# Patient Record
Sex: Female | Born: 1960 | Race: White | Hispanic: No | Marital: Married | Smoking: Former smoker
Health system: Southern US, Community
[De-identification: ages and names within clinical notes are randomized; demographics above are authoritative.]

## PROBLEM LIST (undated history)

## (undated) DIAGNOSIS — K8020 Calculus of gallbladder without cholecystitis without obstruction: Secondary | ICD-10-CM

## (undated) DIAGNOSIS — L509 Urticaria, unspecified: Secondary | ICD-10-CM

## (undated) DIAGNOSIS — R1310 Dysphagia, unspecified: Secondary | ICD-10-CM

## (undated) DIAGNOSIS — G709 Myoneural disorder, unspecified: Secondary | ICD-10-CM

## (undated) DIAGNOSIS — J45909 Unspecified asthma, uncomplicated: Secondary | ICD-10-CM

## (undated) DIAGNOSIS — D126 Benign neoplasm of colon, unspecified: Secondary | ICD-10-CM

## (undated) DIAGNOSIS — K219 Gastro-esophageal reflux disease without esophagitis: Secondary | ICD-10-CM

## (undated) DIAGNOSIS — F329 Major depressive disorder, single episode, unspecified: Secondary | ICD-10-CM

## (undated) DIAGNOSIS — I8500 Esophageal varices without bleeding: Secondary | ICD-10-CM

## (undated) DIAGNOSIS — Z973 Presence of spectacles and contact lenses: Secondary | ICD-10-CM

## (undated) DIAGNOSIS — F32A Depression, unspecified: Secondary | ICD-10-CM

## (undated) DIAGNOSIS — F419 Anxiety disorder, unspecified: Secondary | ICD-10-CM

## (undated) DIAGNOSIS — M1990 Unspecified osteoarthritis, unspecified site: Secondary | ICD-10-CM

## (undated) DIAGNOSIS — D589 Hereditary hemolytic anemia, unspecified: Secondary | ICD-10-CM

## (undated) DIAGNOSIS — G8929 Other chronic pain: Secondary | ICD-10-CM

## (undated) DIAGNOSIS — Z9109 Other allergy status, other than to drugs and biological substances: Secondary | ICD-10-CM

## (undated) DIAGNOSIS — D696 Thrombocytopenia, unspecified: Secondary | ICD-10-CM

## (undated) DIAGNOSIS — F411 Generalized anxiety disorder: Secondary | ICD-10-CM

## (undated) DIAGNOSIS — R519 Headache, unspecified: Secondary | ICD-10-CM

## (undated) DIAGNOSIS — T7840XA Allergy, unspecified, initial encounter: Secondary | ICD-10-CM

## (undated) DIAGNOSIS — K7460 Unspecified cirrhosis of liver: Secondary | ICD-10-CM

## (undated) HISTORY — PX: CHOLECYSTECTOMY: SHX55

## (undated) HISTORY — DX: Dysphagia, unspecified: R13.10

## (undated) HISTORY — PX: NASAL SEPTUM SURGERY: SHX37

## (undated) HISTORY — DX: Other chronic pain: G89.29

## (undated) HISTORY — PX: PROSTATE SURGERY: SHX751

## (undated) HISTORY — DX: Urticaria, unspecified: L50.9

## (undated) HISTORY — DX: Generalized anxiety disorder: F41.1

## (undated) HISTORY — DX: Anxiety disorder, unspecified: F41.9

## (undated) HISTORY — PX: APPENDECTOMY: SHX54

## (undated) HISTORY — PX: BREAST SURGERY: SHX581

## (undated) HISTORY — DX: Calculus of gallbladder without cholecystitis without obstruction: K80.20

## (undated) HISTORY — DX: Gastro-esophageal reflux disease without esophagitis: K21.9

## (undated) HISTORY — PX: BREAST REDUCTION SURGERY: SHX8

## (undated) HISTORY — DX: Benign neoplasm of colon, unspecified: D12.6

## (undated) HISTORY — DX: Depression, unspecified: F32.A

## (undated) HISTORY — DX: Unspecified asthma, uncomplicated: J45.909

## (undated) HISTORY — DX: Myoneural disorder, unspecified: G70.9

## (undated) HISTORY — DX: Allergy, unspecified, initial encounter: T78.40XA

## (undated) HISTORY — DX: Major depressive disorder, single episode, unspecified: F32.9

---
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
## 1978-05-25 HISTORY — PX: BREAST SURGERY: SHX581

## 2010-08-14 ENCOUNTER — Encounter (INDEPENDENT_AMBULATORY_CARE_PROVIDER_SITE_OTHER): Payer: Self-pay | Admitting: *Deleted

## 2010-08-15 ENCOUNTER — Other Ambulatory Visit: Payer: Self-pay | Admitting: Family Medicine

## 2010-08-15 DIAGNOSIS — Z1231 Encounter for screening mammogram for malignant neoplasm of breast: Secondary | ICD-10-CM

## 2010-08-25 ENCOUNTER — Ambulatory Visit: Payer: Self-pay

## 2010-09-09 ENCOUNTER — Ambulatory Visit: Payer: Medicare PPO | Attending: Internal Medicine | Admitting: Physical Therapy

## 2010-09-09 DIAGNOSIS — R5381 Other malaise: Secondary | ICD-10-CM | POA: Insufficient documentation

## 2010-09-09 DIAGNOSIS — R262 Difficulty in walking, not elsewhere classified: Secondary | ICD-10-CM | POA: Insufficient documentation

## 2010-09-09 DIAGNOSIS — M545 Low back pain, unspecified: Secondary | ICD-10-CM | POA: Insufficient documentation

## 2010-09-09 DIAGNOSIS — IMO0001 Reserved for inherently not codable concepts without codable children: Secondary | ICD-10-CM | POA: Insufficient documentation

## 2010-09-09 DIAGNOSIS — M2569 Stiffness of other specified joint, not elsewhere classified: Secondary | ICD-10-CM | POA: Insufficient documentation

## 2010-09-10 ENCOUNTER — Ambulatory Visit
Admission: RE | Admit: 2010-09-10 | Discharge: 2010-09-10 | Disposition: A | Discharge status: Home / Self Care | Payer: Medicare PPO | Source: Ambulatory Visit | Attending: Family Medicine | Admitting: Family Medicine

## 2010-09-10 DIAGNOSIS — Z1231 Encounter for screening mammogram for malignant neoplasm of breast: Secondary | ICD-10-CM

## 2010-09-11 ENCOUNTER — Encounter: Payer: Self-pay | Admitting: Internal Medicine

## 2010-09-11 ENCOUNTER — Ambulatory Visit (AMBULATORY_SURGERY_CENTER): Payer: Medicare PPO | Admitting: *Deleted

## 2010-09-11 VITALS — Ht 66.0 in | Wt 249.0 lb

## 2010-09-11 DIAGNOSIS — Z1211 Encounter for screening for malignant neoplasm of colon: Secondary | ICD-10-CM

## 2010-09-11 MED ORDER — PEG-KCL-NACL-NASULF-NA ASC-C 100 G PO SOLR: 1.0000 | Freq: Once | ORAL | Status: AC

## 2010-09-16 ENCOUNTER — Ambulatory Visit: Payer: Medicare PPO | Admitting: Physical Therapy

## 2010-09-17 ENCOUNTER — Ambulatory Visit: Payer: Medicare PPO | Admitting: Physical Therapy

## 2010-09-24 ENCOUNTER — Encounter: Payer: Medicare PPO | Admitting: Physical Therapy

## 2010-09-25 ENCOUNTER — Ambulatory Visit: Payer: Medicare PPO | Attending: Internal Medicine | Admitting: Physical Therapy

## 2010-09-25 ENCOUNTER — Other Ambulatory Visit: Payer: Self-pay | Admitting: Internal Medicine

## 2010-09-25 DIAGNOSIS — R262 Difficulty in walking, not elsewhere classified: Secondary | ICD-10-CM | POA: Insufficient documentation

## 2010-09-25 DIAGNOSIS — IMO0001 Reserved for inherently not codable concepts without codable children: Secondary | ICD-10-CM | POA: Insufficient documentation

## 2010-09-25 DIAGNOSIS — M545 Low back pain, unspecified: Secondary | ICD-10-CM | POA: Insufficient documentation

## 2010-09-25 DIAGNOSIS — R5381 Other malaise: Secondary | ICD-10-CM | POA: Insufficient documentation

## 2010-09-25 DIAGNOSIS — M2569 Stiffness of other specified joint, not elsewhere classified: Secondary | ICD-10-CM | POA: Insufficient documentation

## 2010-09-29 ENCOUNTER — Encounter: Payer: Self-pay | Admitting: Internal Medicine

## 2010-09-29 ENCOUNTER — Encounter: Payer: Medicare PPO | Admitting: Physical Therapy

## 2010-09-30 ENCOUNTER — Encounter: Payer: Self-pay | Admitting: Internal Medicine

## 2010-09-30 ENCOUNTER — Ambulatory Visit (AMBULATORY_SURGERY_CENTER): Payer: Medicare PPO | Admitting: Internal Medicine

## 2010-09-30 VITALS — BP 137/76 | HR 104 | Temp 98.4°F | Resp 9 | Ht 66.0 in | Wt 245.0 lb

## 2010-09-30 DIAGNOSIS — D175 Benign lipomatous neoplasm of intra-abdominal organs: Secondary | ICD-10-CM

## 2010-09-30 DIAGNOSIS — D126 Benign neoplasm of colon, unspecified: Secondary | ICD-10-CM

## 2010-09-30 DIAGNOSIS — K5730 Diverticulosis of large intestine without perforation or abscess without bleeding: Secondary | ICD-10-CM

## 2010-09-30 DIAGNOSIS — Z1211 Encounter for screening for malignant neoplasm of colon: Secondary | ICD-10-CM

## 2010-09-30 MED ORDER — SODIUM CHLORIDE 0.9 % IV SOLN: 500.0000 mL | INTRAVENOUS | Status: DC

## 2010-09-30 NOTE — Patient Instructions (Addendum)
1 polyp Mild diverticulosis 1 lipoma  High fiber diet Await pathology to schedule recall colonoscopy

## 2010-09-30 NOTE — Progress Notes (Signed)
i saw note not to touch pt's left ankle.  I advise pt we would be very careful with her ankle.MAW  I made Dr. Juanda Chance aware of the pt's ankle. MAW  Pt was very uncomfortable while the scope was advanced at times.  Medication was titrated per MD orders.  Pt relaxed and rested comfortably on the way out of the cecum.  MAW

## 2010-09-30 NOTE — Progress Notes (Signed)
Pt. Stated that have chronic regional pain syndrome left ankle. Stated that when wake up will be in pain, do not touch left ankle.

## 2010-10-01 ENCOUNTER — Ambulatory Visit: Payer: Medicare PPO | Admitting: Physical Therapy

## 2010-10-01 ENCOUNTER — Telehealth: Payer: Self-pay | Admitting: *Deleted

## 2010-10-01 NOTE — Telephone Encounter (Signed)
Follow up Call- Patient questions:  Do you have a fever, pain , or abdominal swelling? yes Pain Score  1 *  Have you tolerated food without any problems? yes  Have you been able to return to your normal activities? yes  Do you have any questions about your discharge instructions: Diet   no Medications  no Follow up visit  no  Do you have questions or concerns about your Care? no  Actions: * If pain score is 4 or above: No action needed, pain <4.  Patient stating mild pain lower abd. And back, less than a 4, no problems with fever or swelling or any difficulty with normal activities.

## 2010-10-03 ENCOUNTER — Encounter: Payer: Medicare PPO | Admitting: Physical Therapy

## 2010-10-06 ENCOUNTER — Encounter: Payer: Self-pay | Admitting: Internal Medicine

## 2010-10-06 ENCOUNTER — Encounter: Payer: Medicare PPO | Admitting: Physical Therapy

## 2010-10-08 ENCOUNTER — Encounter: Payer: Medicare PPO | Admitting: Physical Therapy

## 2010-10-09 ENCOUNTER — Encounter: Payer: Medicare PPO | Admitting: Physical Therapy

## 2010-10-13 ENCOUNTER — Encounter: Payer: Medicare PPO | Admitting: Physical Therapy

## 2010-10-15 ENCOUNTER — Encounter: Payer: Medicare PPO | Admitting: Physical Therapy

## 2010-10-17 ENCOUNTER — Encounter: Payer: Medicare PPO | Admitting: Physical Therapy

## 2010-11-17 NOTE — Letter (Signed)
Summary: Pre Visit Letter Revised  [facility]  [address] [place], [place] [address]   Phone: [phone]  Fax: [phone]        08/14/2010 MRN: [id number] Theresa Moran [address] [place], [place]  [address]  [place]             Procedure Date:  09-25-10           Direct Colon---Dr. Juanda Chance   Welcome to the Gastroenterology Division at [facility].    You are scheduled to see a nurse for your pre-procedure visit on 09-11-10 at 10:30a.m. on the 3rd floor at [facility], [address]  We ask that you try to arrive at our office 15 minutes prior to your appointment time to allow for check-in.  Please take a minute to review the attached form.  If you answer "Yes" to one or more of the questions on the first page, we ask that you call the person listed at your earliest opportunity.  If you answer "No" to all of the questions, please complete the rest of the form and bring it to your appointment.    Your nurse visit will consist of discussing your medical and surgical history, your immediate family medical history, and your medications.   If you are unable to list all of your medications on the form, please bring the medication bottles to your appointment and we will list them.  We will need to be aware of both prescribed and over the counter drugs.  We will need to know exact dosage information as well.    Please be prepared to read and sign documents such as consent forms, a financial agreement, and acknowledgement forms.  If necessary, and with your consent, a friend or relative is welcome to sit-in on the nurse visit with you.  Please bring your insurance card so that we may make a copy of it.  If your insurance requires a referral to see a specialist, please bring your referral form from your primary care physician.  No co-pay is required for this nurse visit.     If you cannot keep your appointment, please call [phone] to cancel or reschedule prior to  your appointment date.  This allows [place] the opportunity to schedule an appointment for another patient in need of care.    Thank you for choosing [facility] for your medical needs.  We appreciate the opportunity to care for you.  Please visit [place] at our website  to learn more about our practice.  Sincerely, The Gastroenterology Division

## 2011-01-14 ENCOUNTER — Other Ambulatory Visit: Payer: Self-pay | Admitting: Neurology

## 2011-01-14 DIAGNOSIS — R269 Unspecified abnormalities of gait and mobility: Secondary | ICD-10-CM

## 2011-01-14 DIAGNOSIS — R259 Unspecified abnormal involuntary movements: Secondary | ICD-10-CM

## 2011-01-14 DIAGNOSIS — R252 Cramp and spasm: Secondary | ICD-10-CM

## 2011-01-27 ENCOUNTER — Ambulatory Visit
Admission: RE | Admit: 2011-01-27 | Discharge: 2011-01-27 | Disposition: A | Discharge status: Home / Self Care | Payer: Medicare PPO | Source: Ambulatory Visit | Attending: Neurology | Admitting: Neurology

## 2011-01-27 DIAGNOSIS — R259 Unspecified abnormal involuntary movements: Secondary | ICD-10-CM

## 2011-01-27 DIAGNOSIS — R269 Unspecified abnormalities of gait and mobility: Secondary | ICD-10-CM

## 2011-01-27 DIAGNOSIS — R252 Cramp and spasm: Secondary | ICD-10-CM

## 2011-02-18 NOTE — Progress Notes (Signed)
Addended by: Maple Hudson on: 02/18/2011 05:18 PM   Modules accepted: Level of Service

## 2012-06-10 ENCOUNTER — Ambulatory Visit (INDEPENDENT_AMBULATORY_CARE_PROVIDER_SITE_OTHER): Payer: Medicare HMO | Admitting: Emergency Medicine

## 2012-06-10 VITALS — BP 122/78 | HR 112 | Temp 98.1°F | Resp 16 | Ht 65.5 in | Wt 204.0 lb

## 2012-06-10 DIAGNOSIS — K6289 Other specified diseases of anus and rectum: Secondary | ICD-10-CM

## 2012-06-10 DIAGNOSIS — G8929 Other chronic pain: Secondary | ICD-10-CM

## 2012-06-10 DIAGNOSIS — K645 Perianal venous thrombosis: Secondary | ICD-10-CM

## 2012-06-10 DIAGNOSIS — M549 Dorsalgia, unspecified: Secondary | ICD-10-CM | POA: Insufficient documentation

## 2012-06-10 NOTE — Progress Notes (Deleted)
  Subjective:    Patient ID: Theresa Moran, female    DOB: Dec 14, 1960, 52 y.o.   MRN: [id number]  HPI    Review of Systems     Objective:   Physical Exam        Assessment & Plan:

## 2012-06-10 NOTE — Progress Notes (Signed)
  Subjective:    Patient ID: Theresa Moran, female    DOB: 18-Jul-1960, 52 y.o.   MRN: [id number]  HPI  52 year old female presents today for possible hemmorrhoids. Last night around 5 o'clock, she noticed a small hemmorrhoid that she gets periodically.  Woke up around 1 am and it was bigger and she used cream and pushed it back it.  Woke up around 4 and it was bigger.  At 7am it was big and discolored.  No bleeding and up to date on colon checks.  Has not been doing a lot of straining.  Has a history of hemmorrhoids but only one has been lanced.      Review of Systems     Objective:   Physical Exam there is a 2 x 2 centimeter thrombosed hemorrhoid visible at 9:00. When the patient is in the supine position laying on her left side is very visible superiorly. I applied topical lidocaine 2% and plan to inject the area after Betadine cleaning opened with a #11 blade and  enucleated the hemorrhoid. The above procedure was performed patient tolerated well and a centimeter and a half clot was removed the        Assessment & Plan:  Patient here for thrombosed hemorrhoid. Enucleation performed. Patient tolerated well. She is to use topical creams at present.

## 2012-06-10 NOTE — Patient Instructions (Addendum)

## 2012-12-23 DIAGNOSIS — F32A Depression, unspecified: Secondary | ICD-10-CM | POA: Insufficient documentation

## 2012-12-23 DIAGNOSIS — F329 Major depressive disorder, single episode, unspecified: Secondary | ICD-10-CM

## 2012-12-23 HISTORY — DX: Major depressive disorder, single episode, unspecified: F32.9

## 2013-06-14 ENCOUNTER — Ambulatory Visit: Payer: Medicare Other

## 2013-06-14 ENCOUNTER — Ambulatory Visit (INDEPENDENT_AMBULATORY_CARE_PROVIDER_SITE_OTHER): Payer: Medicare Other | Admitting: Family Medicine

## 2013-06-14 VITALS — BP 140/100 | HR 136 | Temp 97.6°F | Resp 16 | Ht 65.5 in | Wt 249.0 lb

## 2013-06-14 DIAGNOSIS — J069 Acute upper respiratory infection, unspecified: Secondary | ICD-10-CM

## 2013-06-14 DIAGNOSIS — J9801 Acute bronchospasm: Secondary | ICD-10-CM

## 2013-06-14 DIAGNOSIS — R0602 Shortness of breath: Secondary | ICD-10-CM

## 2013-06-14 DIAGNOSIS — R000 Tachycardia, unspecified: Secondary | ICD-10-CM

## 2013-06-14 LAB — POCT CBC
Granulocyte percent: 68.3 %G (ref 37–80)
HEMATOCRIT: 46.4 % (ref 37.7–47.9)
Hemoglobin: 14.6 g/dL (ref 12.2–16.2)
Lymph, poc: 2 (ref 0.6–3.4)
MCH, POC: 29.3 pg (ref 27–31.2)
MCHC: 31.5 g/dL — AB (ref 31.8–35.4)
MCV: 93.2 fL (ref 80–97)
MID (cbc): 0.7 (ref 0–0.9)
MPV: 8.5 fL (ref 0–99.8)
PLATELET COUNT, POC: 244 10*3/uL (ref 142–424)
POC Granulocyte: 5.7 (ref 2–6.9)
POC LYMPH PERCENT: 23.8 %L (ref 10–50)
POC MID %: 7.9 %M (ref 0–12)
RBC: 4.98 M/uL (ref 4.04–5.48)
RDW, POC: 14 %
WBC: 8.3 10*3/uL (ref 4.6–10.2)

## 2013-06-14 LAB — POCT INFLUENZA A/B
INFLUENZA B, POC: NEGATIVE
Influenza A, POC: NEGATIVE

## 2013-06-14 MED ORDER — PREDNISONE 20 MG PO TABS: ORAL_TABLET | ORAL | Status: DC

## 2013-06-14 MED ORDER — ALBUTEROL SULFATE HFA 108 (90 BASE) MCG/ACT IN AERS: 2.0000 | INHALATION_SPRAY | Freq: Four times a day (QID) | RESPIRATORY_TRACT | Status: DC | PRN

## 2013-06-14 MED ORDER — METHYLPREDNISOLONE ACETATE 80 MG/ML IJ SUSP
80.0000 mg | Freq: Once | INTRAMUSCULAR | Status: AC
  Administered 2013-06-14: 80 mg via INTRAMUSCULAR

## 2013-06-14 MED ORDER — ALBUTEROL SULFATE (2.5 MG/3ML) 0.083% IN NEBU: 2.5000 mg | INHALATION_SOLUTION | Freq: Four times a day (QID) | RESPIRATORY_TRACT | Status: DC | PRN

## 2013-06-14 MED ORDER — AZITHROMYCIN 250 MG PO TABS: ORAL_TABLET | ORAL | Status: DC

## 2013-06-14 MED ORDER — IPRATROPIUM BROMIDE 0.02 % IN SOLN
0.5000 mg | Freq: Four times a day (QID) | RESPIRATORY_TRACT | Status: DC
Start: 2013-06-14 — End: 2015-12-24

## 2013-06-14 NOTE — Patient Instructions (Signed)
1.  Recommend oral antihistamine daily for the next seven days (Claritin or Zyrtec or Allegra). 2.  Present to office or emergency department for worsening shortness of breath.

## 2013-06-14 NOTE — Progress Notes (Signed)
Subjective:    Patient ID: Theresa Moran, female    DOB: [birth year], 53 y.o.   MRN: [id number]  HPI This 53 y.o. female presents for evaluation of SOB, respiratory distress.  Onset of cold symptoms five days ago with low fever to 94.0, chills.  +HA.  +ST diffuse; +rhinorrhea started yesterday.  +nasal congestion started yesterday. +horrible cough x 5 days.  +SOB for two days; +wheezing with SOB.  No history of asthma or COPD/emphysema.  No tobacco abuse.  History of wheezing in the past with mold exposure; used inhaler in past; underwent extensive pulmonology evaluation at that time; required hospitalization due to wheezing, respiratory distress. Stayed in hotel on day of onset of symptoms; thinks hotel had mold.  No n/v/d.  S/p flu vaccine.  Retired pediatric physical therapist.  Hervey Ard R sided chest pain with coughing only; no exertional chest pain; no history of CAD or heart issues.   Review of Systems  Constitutional: Positive for chills and diaphoresis. Negative for fever.  HENT: Positive for congestion, ear pain, postnasal drip, rhinorrhea, sore throat and voice change. Negative for sinus pressure and trouble swallowing.   Respiratory: Positive for cough, shortness of breath and wheezing.   Gastrointestinal: Negative for nausea, vomiting and diarrhea.  Neurological: Positive for headaches. Negative for dizziness and light-headedness.   Past Medical History  Diagnosis Date  . GERD (gastroesophageal reflux disease)   . Asthma   . Depression   . Gallstones   . Chronic pain   . Allergy   . Neuromuscular disorder    Past Surgical History  Procedure Laterality Date  . Appendectomy    . Cholecystectomy    . Breast reduction surgery    . Nasal septum surgery    . Breast surgery     Allergies  Allergen Reactions  . Shellfish-Derived Products    Current Outpatient Prescriptions on File Prior to Visit  Medication Sig Dispense Refill  . nefazodone (SERZONE) 200 MG tablet Take 200 mg by  mouth Daily.      . nortriptyline (PAMELOR) 10 MG capsule Take 10 mg by mouth at bedtime.       Current Facility-Administered Medications on File Prior to Visit  Medication Dose Route Frequency Provider Last Rate Last Dose  . 0.9 %  sodium chloride infusion  500 mL Intravenous Continuous Lafayette Dragon, MD       History   Social History  . Marital Status: Significant Other    Spouse Name: N/A    Number of Children: N/A  . Years of Education: N/A   Occupational History  . Not on file.   Social History Main Topics  . Smoking status: Former Research scientist (life sciences)  . Smokeless tobacco: Never Used  . Alcohol Use: No  . Drug Use: No  . Sexual Activity: Not on file   Other Topics Concern  . Not on file   Social History Narrative  . No narrative on file   Family History  Problem Relation Age of Onset  . Diabetes Father   . Stroke Father        Objective:   Physical Exam  Nursing note and vitals reviewed. Constitutional: She is oriented to person, place, and time. She appears well-developed and well-nourished. She appears distressed.  Speaking in short choppy sentences.  Brought back with priority due to respiratory distress.  HENT:  Head: Normocephalic and atraumatic.  Right Ear: External ear normal.  Left Ear: External ear normal.  Nose: Nose normal.  Mouth/Throat:  Oropharynx is clear and moist.  Eyes: Conjunctivae are normal. Pupils are equal, round, and reactive to light.  Neck: Normal range of motion. Neck supple.  Cardiovascular: Tachycardia present.  Exam reveals no gallop and no friction rub.   No murmur heard. No edema BLE.    Pulmonary/Chest: She is in respiratory distress. She has decreased breath sounds. She has wheezes in the right upper field, the right middle field, the right lower field, the left upper field, the left middle field and the left lower field. She has rhonchi in the right upper field and the left upper field. She has no rales.  Frequent harsh coughing  throughout exam.  Decreased wheezing intermittently after coughing.  Lymphadenopathy:    She has no cervical adenopathy.  Neurological: She is alert and oriented to person, place, and time.  Skin: Skin is warm and dry. No rash noted. She is not diaphoretic.  Psychiatric: She has a normal mood and affect. Her behavior is normal.    ALBUTEROL/ATROVENT NEBULIZER ADMINISTERED AT 11:35 AM.  Results for orders placed in visit on 06/14/13  POCT CBC      Result Value Range   WBC 8.3  4.6 - 10.2 K/uL   Lymph, poc 2.0  0.6 - 3.4   POC LYMPH PERCENT 23.8  10 - 50 %L   MID (cbc) 0.7  0 - 0.9   POC MID % 7.9  0 - 12 %M   POC Granulocyte 5.7  2 - 6.9   Granulocyte percent 68.3  37 - 80 %G   RBC 4.98  4.04 - 5.48 M/uL   Hemoglobin 14.6  12.2 - 16.2 g/dL   HCT, POC 46.4  37.7 - 47.9 %   MCV 93.2  80 - 97 fL   MCH, POC 29.3  27 - 31.2 pg   MCHC 31.5 (*) 31.8 - 35.4 g/dL   RDW, POC 14.0     Platelet Count, POC 244  142 - 424 K/uL   MPV 8.5  0 - 99.8 fL  POCT INFLUENZA A/B      Result Value Range   Influenza A, POC Negative     Influenza B, POC Negative     [facility] reading (PRIMARY) by  Dr. Tamala Julian. CXR:  Increased markings RML without definite infiltrate.    EKG:  SINUS TACHY AT 119; NO ST CHANGES.  ZYRTEC 5MG  X 2 ADMINISTERED.  Repeat blood pressure 128/88.  Pulse oximetry: 95% ON ROOM AIR.  DEPOMEDROL 80MG  IM ADMINISTERED.   POST-NEBULIZER PEAK FLOWS: 350, 350, 350; (PREDICTED 412). POST NEBULIZER EXAM: INCREASED AIR MOVEMENT; RESOLUTION OF WHEEZING. NO TACHYPNEA; SPEAKING COMPLETE SENTENCES.    Assessment & Plan:  SOB (shortness of breath) - Plan: POCT CBC, POCT Influenza A/B, ipratropium (ATROVENT) 0.02 % nebulizer solution, DG Chest 2 View, EKG 12-Lead, DISCONTINUED: albuterol (PROVENTIL) (2.5 MG/3ML) 0.083% nebulizer solution  Bronchospasm  Acute upper respiratory infections of unspecified site - Plan: POCT CBC, POCT Influenza A/B, ipratropium (ATROVENT) 0.02 % nebulizer solution,  DG Chest 2 View, DISCONTINUED: albuterol (PROVENTIL) (2.5 MG/3ML) 0.083% nebulizer solution  Tachycardia - Plan: EKG 12-Lead  1.  Respiratory distress:  New. Secondary to acute bronchospasm. Much improved with nebulizer. 2.  Acute Bronchospasm:  New.  S/p albuterol/atrovent nebulizer in office; s/p depomedrol 80mg  IM; rx for Prednisone taper provided; rx for Albuterol HFA provided to use qid scheduled for five days and then PRN; also provided rx for Albuterol nebulizer solution provided to use PRN.  3.  URI:  New.  Versus allergic rhinitis; rx for Zithromax high dose; recommend antihistamine oral daily.  Meds ordered this encounter  Medications  . tapentadol (NUCYNTA) 50 MG TABS tablet    Sig: Take 50 mg by mouth every 6 (six) hours as needed.  Marland Kitchen DISCONTD: baclofen (LIORESAL) 10 MG tablet    Sig: Take 10 mg by mouth 2 (two) times daily.  Marland Kitchen PRESCRIPTION MEDICATION    Sig: gralice er 600mg  with ever meal  . ipratropium (ATROVENT) 0.02 % nebulizer solution    Sig: Take 2.5 mLs (0.5 mg total) by nebulization 4 (four) times daily.    Dispense:  75 mL    Refill:  12  . DISCONTD: albuterol (PROVENTIL) (2.5 MG/3ML) 0.083% nebulizer solution    Sig: Take 3 mLs (2.5 mg total) by nebulization every 6 (six) hours as needed for wheezing or shortness of breath.    Dispense:  150 mL    Refill:  1  . azithromycin (ZITHROMAX) 250 MG tablet    Sig: Two tablets daily x 5 days    Dispense:  10 tablet    Refill:  0  . predniSONE (DELTASONE) 20 MG tablet    Sig: Three tablets daily x 1 days then two tablets daily x 5 days then one tablet daily x 5 days    Dispense:  18 tablet    Refill:  0  . albuterol (PROVENTIL HFA;VENTOLIN HFA) 108 (90 BASE) MCG/ACT inhaler    Sig: Inhale 2 puffs into the lungs every 6 (six) hours as needed for wheezing or shortness of breath (cough, shortness of breath or wheezing.).    Dispense:  1 Inhaler    Refill:  1  . albuterol (PROVENTIL) (2.5 MG/3ML) 0.083% nebulizer  solution    Sig: Take 3 mLs (2.5 mg total) by nebulization every 6 (six) hours as needed for wheezing or shortness of breath.    Dispense:  75 mL    Refill:  12   Reginia Forts, M.D.  [facility] [address] [place], [place]  [address] [phone] phone [phone] fax

## 2013-09-13 DIAGNOSIS — G90529 Complex regional pain syndrome I of unspecified lower limb: Secondary | ICD-10-CM

## 2013-09-13 HISTORY — DX: Complex regional pain syndrome i of unspecified lower limb: G90.529

## 2014-03-08 DIAGNOSIS — G8929 Other chronic pain: Secondary | ICD-10-CM | POA: Insufficient documentation

## 2014-12-16 ENCOUNTER — Ambulatory Visit (INDEPENDENT_AMBULATORY_CARE_PROVIDER_SITE_OTHER): Payer: Medicare Other | Admitting: Family Medicine

## 2014-12-16 VITALS — BP 120/70 | HR 109 | Temp 97.9°F | Resp 18 | Ht 66.0 in | Wt 264.8 lb

## 2014-12-16 DIAGNOSIS — L42 Pityriasis rosea: Secondary | ICD-10-CM

## 2014-12-16 NOTE — Patient Instructions (Signed)
[facility], [place], [place].[address] [address]   Pityriasis Rosea Pityriasis rosea is a rash which is probably caused by a virus. It generally starts as a scaly, red patch on the trunk (the area of the body that a t-shirt would cover) but does not appear on sun exposed areas. The rash is usually preceded by an initial larger spot called the "herald patch" a week or more before the rest of the rash appears. Generally within one to two days the rash appears rapidly on the trunk, upper arms, and sometimes the upper legs. The rash usually appears as flat, oval patches of scaly pink color. The rash can also be raised and one is able to feel it with a finger. The rash can also be finely crinkled and may slough off leaving a ring of scale around the spot. Sometimes a mild sore throat is present with the rash. It usually affects children and young adults in the spring and autumn. Women are more frequently affected than men. TREATMENT  Pityriasis rosea is a self-limited condition. This means it goes away within 4 to 8 weeks without treatment. The spots may persist for several months, especially in darker-colored skin after the rash has resolved and healed. Benadryl and steroid creams may be used if itching is a problem. SEEK MEDICAL CARE IF:   Your rash does not go away or persists longer than three months.  You develop fever and joint pain.  You develop severe headache and confusion.  You develop breathing difficulty, vomiting and/or extreme weakness. Document Released: 06/17/2001 Document Revised: 08/03/2011 Document Reviewed: 07/06/2008 [facility] Patient Information 2015 [place], [place]. This information is not intended to replace advice given to you by your health care provider. Make sure you discuss any questions you have with your health care provider.

## 2014-12-16 NOTE — Progress Notes (Addendum)
This chart was scribed for Robyn Haber, MD by Moises Blood, medical scribe at [facility].The patient was seen in exam room 14 and the patient's care was started at 11:20 AM.  Patient ID: Theresa Moran MRN: [id number], DOB: August 14, 1960, 54 y.o. Date of Encounter: 12/16/2014  Primary Physician: Robyn Haber, MD  Chief Complaint:  Chief Complaint  Patient presents with   Rash    under bra and back     HPI:  Theresa Moran is a 55 y.o. female who presents to [facility] complaining of a rash on her back and underneath her breast.  She was concerned it was ringworm.   She's going through some tough situations with her family right now.  She is currently not working.   Past Medical History  Diagnosis Date   GERD (gastroesophageal reflux disease)    Asthma    Depression    Gallstones    Chronic pain    Allergy    Neuromuscular disorder      Home Meds: Prior to Admission medications   Medication Sig Start Date End Date Taking? Authorizing Provider  nefazodone (SERZONE) 200 MG tablet Take 200 mg by mouth Daily. 08/25/10  Yes Historical Provider, MD  nortriptyline (PAMELOR) 10 MG capsule Take 10 mg by mouth at bedtime.   Yes Historical Provider, MD  predniSONE (DELTASONE) 20 MG tablet Three tablets daily x 1 days then two tablets daily x 5 days then one tablet daily x 5 days 06/14/13  Yes Wardell Honour, MD  PRESCRIPTION MEDICATION gralice er 600mg  with ever meal   Yes Historical Provider, MD  tapentadol (NUCYNTA) 50 MG TABS tablet Take 50 mg by mouth every 6 (six) hours as needed.   Yes Historical Provider, MD  albuterol (PROVENTIL HFA;VENTOLIN HFA) 108 (90 BASE) MCG/ACT inhaler Inhale 2 puffs into the lungs every 6 (six) hours as needed for wheezing or shortness of breath (cough, shortness of breath or wheezing.). Patient not taking: Reported on 12/16/2014 06/14/13   Wardell Honour, MD  albuterol (PROVENTIL) (2.5 MG/3ML) 0.083% nebulizer  solution Take 3 mLs (2.5 mg total) by nebulization every 6 (six) hours as needed for wheezing or shortness of breath. Patient not taking: Reported on 12/16/2014 06/14/13   Wardell Honour, MD  azithromycin [facility]) 250 MG tablet Two tablets daily x 5 days Patient not taking: Reported on 12/16/2014 06/14/13   Wardell Honour, MD  ipratropium (ATROVENT) 0.02 % nebulizer solution Take 2.5 mLs (0.5 mg total) by nebulization 4 (four) times daily. Patient not taking: Reported on 12/16/2014 06/14/13   Wardell Honour, MD    Allergies:  Allergies  Allergen Reactions   Shellfish-Derived Products     History   Social History   Marital Status: Significant Other    Spouse Name: N/A   Number of Children: N/A   Years of Education: N/A   Occupational History   Not on file.   Social History Main Topics   Smoking status: Former Smoker   Smokeless tobacco: Never Used   Alcohol Use: No   Drug Use: No   Sexual Activity: Not on file   Other Topics Concern   Not on file   Social History Narrative     Review of Systems: Constitutional: negative for chills, fever, night sweats, weight changes, or fatigue  HEENT: negative for vision changes, hearing loss, congestion, rhinorrhea, ST, epistaxis, or sinus pressure Cardiovascular: negative for chest pain or palpitations Respiratory: negative  for hemoptysis, wheezing, shortness of breath, or cough Abdominal: negative for abdominal pain, nausea, vomiting, diarrhea, or constipation Dermatological: positive for rash (back and under breast)  Neurologic: negative for headache, dizziness, or syncope All other systems reviewed and are otherwise negative with the exception to those above and in the HPI.  Physical Exam: Blood pressure 120/70, pulse 109, temperature 97.9 F (36.6 C), temperature source Oral, resp. rate 18, height 5\' 6"  (1.676 m), weight 264 lb 12.8 oz (120.112 kg), SpO2 98 %., Body mass index is 42.76 kg/(m^2). General: Well  developed, well nourished, in no acute distress. Head: Normocephalic, atraumatic, eyes without discharge, sclera non-icteric, nares are without discharge. Bilateral auditory canals clear, TM's are without perforation, pearly grey and translucent with reflective cone of light bilaterally. Oral cavity moist, posterior pharynx without exudate, erythema, peritonsillar abscess, or post nasal drip.  Neck: Supple. No thyromegaly. Full ROM. No lymphadenopathy. Lungs: Clear bilaterally to auscultation without wheezes, rales, or rhonchi. Breathing is unlabored. Heart: RRR with S1 S2. No murmurs, rubs, or gallops appreciated. Abdomen: Soft, non-tender, non-distended with normoactive bowel sounds. No hepatomegaly. No rebound/guarding. No obvious abdominal masses. Msk:  Strength and tone normal for age. Extremities/Skin: Warm and dry. No clubbing or cyanosis. No edema. Patient has a diffuse rash on her torso: Oval shaped variably sized erythematous ringed lesions measuring 1/2 to 1 cm following dermatomal lines, central clearing of the lesions Neuro: Alert and oriented X 3. Moves all extremities spontaneously. Gait is normal. CNII-XII grossly in tact. Psych:  Responds to questions appropriately with a normal affect.    ASSESSMENT AND PLAN:  54 y.o. year old female with This chart was scribed in my presence and reviewed by me personally.    ICD-9-CM ICD-10-CM   1. Pityriasis rosea 696.3 L42      Signed, Robyn Haber, MD    Signed, Robyn Haber, MD 12/16/2014 11:31 AM

## 2015-12-24 ENCOUNTER — Ambulatory Visit (INDEPENDENT_AMBULATORY_CARE_PROVIDER_SITE_OTHER): Payer: Medicare Other | Admitting: Physician Assistant

## 2015-12-24 VITALS — BP 120/84 | HR 104 | Temp 97.4°F | Resp 17 | Ht 66.0 in | Wt 254.0 lb

## 2015-12-24 DIAGNOSIS — L501 Idiopathic urticaria: Secondary | ICD-10-CM | POA: Diagnosis not present

## 2015-12-24 LAB — CBC WITH DIFFERENTIAL/PLATELET
Basophils Absolute: 0 cells/uL (ref 0–200)
Basophils Relative: 0 %
Eosinophils Absolute: 73 cells/uL (ref 15–500)
Eosinophils Relative: 1 %
HEMATOCRIT: 43.3 % (ref 35.0–45.0)
HEMOGLOBIN: 14.4 g/dL (ref 11.7–15.5)
LYMPHS ABS: 2044 {cells}/uL (ref 850–3900)
LYMPHS PCT: 28 %
MCH: 29.4 pg (ref 27.0–33.0)
MCHC: 33.3 g/dL (ref 32.0–36.0)
MCV: 88.4 fL (ref 80.0–100.0)
MONO ABS: 365 {cells}/uL (ref 200–950)
MPV: 10 fL (ref 7.5–12.5)
Monocytes Relative: 5 %
NEUTROS PCT: 66 %
Neutro Abs: 4818 cells/uL (ref 1500–7800)
Platelets: 234 10*3/uL (ref 140–400)
RBC: 4.9 MIL/uL (ref 3.80–5.10)
RDW: 13.7 % (ref 11.0–15.0)
WBC: 7.3 10*3/uL (ref 3.8–10.8)

## 2015-12-24 LAB — COMPLETE METABOLIC PANEL WITH GFR
ALK PHOS: 80 U/L (ref 33–130)
ALT: 22 U/L (ref 6–29)
AST: 26 U/L (ref 10–35)
Albumin: 4.8 g/dL (ref 3.6–5.1)
BUN: 20 mg/dL (ref 7–25)
CALCIUM: 9.6 mg/dL (ref 8.6–10.4)
CO2: 23 mmol/L (ref 20–31)
Chloride: 104 mmol/L (ref 98–110)
Creat: 1.09 mg/dL — ABNORMAL HIGH (ref 0.50–1.05)
GFR, EST NON AFRICAN AMERICAN: 56 mL/min — AB (ref >=60)
GFR, Est African American: 65 mL/min (ref >=60)
Glucose, Bld: 103 mg/dL — ABNORMAL HIGH (ref 65–99)
POTASSIUM: 4.4 mmol/L (ref 3.5–5.3)
Sodium: 141 mmol/L (ref 135–146)
Total Bilirubin: 0.4 mg/dL (ref 0.2–1.2)
Total Protein: 7.8 g/dL (ref 6.1–8.1)

## 2015-12-24 LAB — SEDIMENTATION RATE: Sed Rate: 10 mm/hr (ref 0–30)

## 2015-12-24 LAB — TSH: TSH: 1.21 mIU/L

## 2015-12-24 MED ORDER — CETIRIZINE HCL 10 MG PO TABS: 10.0000 mg | ORAL_TABLET | Freq: Every day | ORAL | 11 refills | Status: DC

## 2015-12-24 MED ORDER — EPINEPHRINE 0.3 MG/0.3ML IJ SOAJ: 0.3000 mg | Freq: Once | INTRAMUSCULAR | 1 refills | Status: AC

## 2015-12-24 NOTE — Patient Instructions (Addendum)
It is okay to take 1 zyrtec daily to help control your symptoms.  It is okay to take benadryl as needed if the zyrtec is not helping enough. Please attend your allergy appointment and I will follow today's labs.     IF you received an x-ray today, you will receive an invoice from [facility] Rad[facility]. Please contact [facility] at [phone] with questions or concerns regarding your invoice.   IF you received labwork today, you will receive an invoice from [employer]. Please contact [facility] at [phone] with questions or concerns regarding your invoice.   Our billing staff will not be able to assist you with questions regarding bills from these companies.  You will be contacted with the lab results as soon as they are available. The fastest way to get your results is to activate your My Chart account. Instructions are located on the last page of this paperwork. If you have not heard from [place] regarding the results in 2 weeks, please contact this office.     We recommend that you schedule a mammogram for breast cancer screening. Typically, you do not need a referral to do this. Please contact a local imaging center to schedule your mammogram.  [facility] - [phone]  *ask for the Radiology Department [facility] - [phone] or [phone]  [facility] - [phone] [place] [phone] [facility] - [phone]  *ask for the [facility] - [phone]  *ask for the Radiology Department [facility] - [phone]  *ask for the [facility] - [phone]

## 2015-12-24 NOTE — Progress Notes (Signed)
12/24/2015 4:33 PM   DOB: 01/19/1961 / MRN: [id number]  SUBJECTIVE:  Theresa Moran is a 55 y.o. female presenting for chronic urticaria.  This has been an ongoing problem for about 1.5 months and she feels it is getting worse. She notes the urticaria is worse in the morning and after warm showers, or anytime she become warm.  She denies any new medications and exposures to new makeup or detergents.  Aside for the rash she feels well.    She is allergic to shellfish-derived products.   She  has a past medical history of Allergy; Asthma; Chronic pain; Depression; Gallstones; GERD (gastroesophageal reflux disease); and Neuromuscular disorder.    She  reports that she has quit smoking. She has never used smokeless tobacco. She reports that she does not drink alcohol or use drugs. She  has no sexual activity history on file. The patient  has a past surgical history that includes Appendectomy; Cholecystectomy; Breast reduction surgery; Nasal septum surgery; and Breast surgery.  Her family history includes Diabetes in her father; Stroke in her father.  Review of Systems  Constitutional: Negative for fever.  HENT: Negative for sore throat.   Eyes: Negative for blurred vision.  Respiratory: Negative for cough.   Cardiovascular: Negative for chest pain.  Genitourinary: Negative for dysuria.  Skin: Positive for itching and rash.  Neurological: Negative for dizziness and headaches.  Endo/Heme/Allergies: Negative for polydipsia.    The problem list and medications were reviewed and updated by myself where necessary and exist elsewhere in the encounter.   OBJECTIVE:  BP 120/84 (BP Location: Right Arm, Patient Position: Sitting, Cuff Size: Large)   Pulse (!) 104   Temp 97.4 F (36.3 C) (Oral)   Resp 17   Ht 5\' 6"  (1.676 m)   Wt 254 lb (115.2 kg)   SpO2 99%   BMI 41.00 kg/m   Physical Exam  Constitutional: She is oriented to person, place, and time.  Cardiovascular: Normal rate and regular  rhythm.   Pulmonary/Chest: Effort normal and breath sounds normal.  Neurological: She is alert and oriented to person, place, and time.  Skin: Skin is warm and dry. Rash (generalized small hives, increased on the trunk and back) noted. No erythema. No pallor.  Psychiatric: She has a normal mood and affect. Her behavior is normal. Judgment and thought content normal.  Vitals reviewed.     No results found for this or any previous visit (from the past 72 hour(s)).  No results found.  ASSESSMENT AND PLAN  [name] was seen today for urticaria.  Diagnoses and all orders for this visit:  Chronic idiopathic urticaria: See avs for patient guidance.  She has had no major medication changes in the last 6 months.  She can not identify a trigger.  She has a shellfish allergy however the tends to cause anaphylaxes. I will order a basic screening and follow those labs.  I will send her to an allergist in the hopes that we can find a specific etiology. She is behind on multiple screenings. She has had some family problems and will come if for a physical when she can. I have given her my card.  -     CBC with Differential -     COMPLETE METABOLIC PANEL WITH GFR -     TSH -     Sedimentation rate -     Ambulatory referral to Allergy -     EPINEPHrine 0.3 mg/0.3 mL IJ SOAJ injection; Inject  0.3 mLs (0.3 mg total) into the muscle once. If you need to use then please call 911. -     cetirizine (ZYRTEC) 10 MG tablet; Take 1 tablet (10 mg total) by mouth daily.    The patient is advised to call or return to clinic if she does not see an improvement in symptoms, or to seek the care of the closest emergency department if she worsens with the above plan.   Philis Fendt, MHS, PA-C [facility] 12/24/2015 4:33 PM

## 2015-12-25 ENCOUNTER — Encounter: Payer: Self-pay | Admitting: *Deleted

## 2015-12-25 LAB — HIV ANTIBODY (ROUTINE TESTING W REFLEX): HIV 1&2 Ab, 4th Generation: NONREACTIVE

## 2016-02-12 ENCOUNTER — Ambulatory Visit (INDEPENDENT_AMBULATORY_CARE_PROVIDER_SITE_OTHER): Payer: Medicare Other | Admitting: Allergy & Immunology

## 2016-02-12 ENCOUNTER — Encounter: Payer: Self-pay | Admitting: Allergy & Immunology

## 2016-02-12 VITALS — BP 126/84 | HR 100 | Temp 98.1°F | Resp 20 | Ht 64.0 in | Wt 253.0 lb

## 2016-02-12 DIAGNOSIS — T781XXD Other adverse food reactions, not elsewhere classified, subsequent encounter: Secondary | ICD-10-CM

## 2016-02-12 DIAGNOSIS — L501 Idiopathic urticaria: Secondary | ICD-10-CM | POA: Diagnosis not present

## 2016-02-12 DIAGNOSIS — L508 Other urticaria: Secondary | ICD-10-CM | POA: Diagnosis not present

## 2016-02-12 MED ORDER — FEXOFENADINE HCL 180 MG PO TABS: 360.0000 mg | ORAL_TABLET | Freq: Every morning | ORAL | 5 refills | Status: DC

## 2016-02-12 MED ORDER — CETIRIZINE HCL 10 MG PO TABS: 20.0000 mg | ORAL_TABLET | Freq: Every day | ORAL | 5 refills | Status: DC

## 2016-02-12 NOTE — Progress Notes (Signed)
NEW PATIENT  Date of Service/Encounter:  02/12/16   Assessment:   Chronic urticaria - Plan: Allergy Test, CBC with Differential/Platelet, Tryptase, Chronic Urticaria  Adverse food reaction, subsequent encounter - SPT (02/12/16) positive to oyster, lobster, tuna (tuna sensatization - tolerates ad lib)  Chronic idiopathic urticaria    Plan/Recommendations:    1. Chronic urticaria - Testing showed: positives to ragweed, mold (Alternaria, Cladosproium), dog, cockroach, dust mite - We will get some blood work: CBC with differential, tryptase, chronic urticaria panel - We will try suppressive dosing and antihistamine first control her hives. - Take fexofenadine (Allegra) 350m (two tablets) in the morning and cetirizine (Zyrtec) 228m(two tablets) at night. - If this is not helping in 1 month when the follow-up, we can advancing treatment to Xolair.  - Consent form provided but the patient took at home to provide an accurate annual income number. - She will be returning that this week or early next week.   2. Shellfish allergy - Testing was positive to oyster and lobster. - Testing was positive to tuna as well, but this is likely a false positive since you have eaten it without problems. - Continue to avoid all shellfish for now due to the risk of cross contamination. - Carry your EpiPen with you at all times.   2. Return in about 4 weeks (around 03/11/2016).   Subjective:   Theresa Moran a 89.o. female presenting today for evaluation of  Chief Complaint  Patient presents with  . Urticaria    since june 2017  .  Theresa MIRESas a history of the following: Patient Active Problem List   Diagnosis Date Noted  . Chronic pain 03/08/2014  . Reflex sympathetic dystrophy of lower extremity 09/13/2013  . Depressive disorder 12/23/2012  . Back pain 06/10/2012    History obtained from: chart review and patient.  Theresa Moran referred by KuRobyn HaberMD.      Theresa Moran a [age].o. female presenting for urticaria. These started around mid June.  At the time she was doing her daily routine. She denies new exposures, including medications, foods, cleaning products, etc. She actually went on vacation for ten days and the hives continued. She has tried using topical steroids, cetirizine 1052maily. But she has never been on prednisone. They pop up and then fade quickly within hours. They leave normal skin when they clear up. She has had no fevers, weight loss, or weight gain. She has not had hot flashes. She denies other systemic symptoms. She did not have viral symptoms with the hives.  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 She had went to the all you can eat crab buffet and then got into bed and she developed rashes, throat swelling, facial swelling. She has had mild exposure since that time such as when she goes into rooms where shellfish is being cooked, cross contamination via oil. She does eat fin fish without a problem. She has never been tested for the shellfish. She tolerates all of the other major food allergens.   She denies seasonal allergies. She did have an issue when she lived in an older home with mold exposure. She ended up in the ED on [facility] twice in less than one week. This was around 9-10 years ago. As part of that, they did a nasal septum surgery. Theresa Moran surmises that it was mold allergy but she was never tested. She has had no  problem since moving out of the house. She has never been on a daily controller medication for breathing. She has never needed albuterol since that episode 9-10 years ago.  Otherwise, there is no history of other atopic diseases, including asthma, drug allergies, food allergies, environmental allergies, or stinging insect allergies. There is no significant infectious history. Vaccinations are up to date.    Past Medical History: Patient Active Problem List   Diagnosis Date Noted  .  Chronic pain 03/08/2014  . Reflex sympathetic dystrophy of lower extremity 09/13/2013  . Depressive disorder 12/23/2012  . Back pain 06/10/2012    Medication List:    Medication List       Accurate as of 02/12/16 12:50 PM. Always use your most recent med list.          baclofen 10 MG tablet Commonly known as:  LIORESAL Take 10 mg by mouth 2 (two) times daily.   cetirizine 10 MG tablet Commonly known as:  ZYRTEC Take 1 tablet (10 mg total) by mouth daily.   diclofenac sodium 1 % Gel Commonly known as:  VOLTAREN Apply topically.   EPIPEN 2-PAK 0.3 mg/0.3 mL Soaj injection Generic drug:  EPINEPHrine Inject 0.3 mg into the muscle once.   GRALISE 600 MG Tabs Generic drug:  Gabapentin (Once-Daily) Take by mouth.   nefazodone 200 MG tablet Commonly known as:  SERZONE Frequency:   Dosage:0.0     Instructions:  Note:   nortriptyline 10 MG capsule Commonly known as:  PAMELOR Take 10 mg by mouth at bedtime.   NUCYNTA ER 150 MG Tb12 Generic drug:  Tapentadol HCl take 1 tablet by mouth twice a day   omeprazole 20 MG capsule Commonly known as:  PRILOSEC Frequency:   Dosage:0.0     Instructions:  Note:   traMADol 50 MG tablet Commonly known as:  ULTRAM Take by mouth every 6 (six) hours as needed.       Birth History: non-contributory  Developmental History: Jovee has met all milestones on time.   Past Surgical History: Past Surgical History:  Procedure Laterality Date  . APPENDECTOMY    . BREAST REDUCTION SURGERY    . BREAST SURGERY    . CHOLECYSTECTOMY    . NASAL SEPTUM SURGERY       Family History: Family History  Problem Relation Age of Onset  . Diabetes Father   . Stroke Father   . Allergic rhinitis Neg Hx   . Angioedema Neg Hx   . Asthma Neg Hx   . Eczema Neg Hx   . Immunodeficiency Neg Hx   . Urticaria Neg Hx      Social History: She lives at home with her wife of 14 years, 33yo daughter, and four grandchildren (two 2yo, 2yo, and 10yo) in a  house that was built in 65. There is hardwood throughout the home. There is electric heating and central cooling. She has dogs, cats, and [place] pig, and birds in her home. The cats do stay in her bedroom. There are no roach is or mice. She does not use dust mite covers. There is no tobacco exposure currently, although she did smoke for 27 years approximately 1 pack per day. Currently she is retired. 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 She has chronic regional pain syndrome.    Review of Systems: a 14-point review of systems is pertinent for what is mentioned in HPI.  Otherwise, all other systems were negative. Constitutional: negative other than that listed in the HPI  Eyes: negative other than that listed in the HPI Ears, nose, mouth, throat, and face: negative other than that listed in the HPI Respiratory: negative other than that listed in the HPI Cardiovascular: negative other than that listed in the HPI Gastrointestinal: negative other than that listed in the HPI Genitourinary: negative other than that listed in the HPI Integument: negative other than that listed in the HPI Hematologic: negative other than that listed in the HPI Musculoskeletal: negative other than that listed in the HPI Neurological: negative other than that listed in the HPI Allergy/Immunologic: negative other than that listed in the HPI    Objective:   Blood pressure 126/84, pulse 100, temperature 98.1 F (36.7 C), temperature source Oral, resp. rate 20, height _0  (1.626 m), weight 253 lb (114.8 kg), SpO2 96 %. Body mass index is 43.43 kg/m.   Physical Exam:  General: Alert, interactive, in no acute distress. Obese female. Very talkative and personable. Infectious smile. HEENT: TMs pearly gray, turbinates edematous with clear discharge, post-pharynx erythematous. Neck: Supple without thyromegaly. Adenopathy: no enlarged lymph nodes appreciated in the anterior cervical, occipital, axillary, epitrochlear,  inguinal, or popliteal regions Lungs: Decreased breath sounds bilaterally without wheezing, rhonchi or rales. No increased work of breathing. CV: Normal S1, S2 without murmurs. Capillary refill <2 seconds.  Abdomen: Nondistended, nontender. Skin: Scattered erythematous urticarial type lesions primarily located bilateral arms, neck, legs , nonvesicular. Extremities:  No clubbing, cyanosis or edema. Neuro:   Grossly intact.  Diagnostic studies:    Allergy Studies:   Indoor/Outdoor Percutaneous Environmental Panel: negative to all with adequate controls  Indoor/Outdoor Intradermal Environmental Panel: positive to ragweed, Alternaria, Cladosporium, dog, cockroach, dust mite  Selected Food Testing: positive to tuna, oyster, lobster and negative to the other seafoods tested    [name], MD [place] [facility]

## 2016-02-12 NOTE — Patient Instructions (Addendum)
1. Chronic urticaria - Testing showed: positives to ragweed, mold (Alternaria, Cladosproium), dog, cockroach, dust mite - We will get some blood work: CBC with differential, tryptase, chronic urticaria panel - We will try suppressive dosing and antihistamine first control her hives. - Take fexofenadine (Allegra) 360mg  (two tablets) in the morning and cetirizine (Zyrtec) 20mg  (two tablets) at night. - If this is not helping in 1 month when the follow-up, we can advancing treatment to [place].   2. Shellfish allergy - Testing was positive to oyster and lobster. - Testing was positive to tuna as well, but this is likely a false positive since you have eaten it without problems. - Continue to avoid all shellfish for now due to the risk of cross contamination. - Carry your EpiPen with you at all times.   2. Return in about 4 weeks (around 03/11/2016).  Please inform us of any Emergency Department visits, hospitalizations, or changes in symptoms. Call us before going to the ED for breathing or allergy symptoms since we might be able to fit you in for a sick visit. Feel free to contact us anytime with any questions, problems, or concerns.  It was a pleasure to meet you today!

## 2016-02-28 LAB — CBC WITH DIFFERENTIAL/PLATELET
BASOS: 0 %
Basophils Absolute: 0 10*3/uL (ref 0.0–0.2)
EOS (ABSOLUTE): 0 10*3/uL (ref 0.0–0.4)
EOS: 1 %
HEMATOCRIT: 42.3 % (ref 34.0–46.6)
HEMOGLOBIN: 14.3 g/dL (ref 11.1–15.9)
IMMATURE GRANULOCYTES: 0 %
Immature Grans (Abs): 0 10*3/uL (ref 0.0–0.1)
Lymphocytes Absolute: 1.9 10*3/uL (ref 0.7–3.1)
Lymphs: 29 %
MCH: 28.9 pg (ref 26.6–33.0)
MCHC: 33.8 g/dL (ref 31.5–35.7)
MCV: 86 fL (ref 79–97)
MONOCYTES: 5 %
MONOS ABS: 0.3 10*3/uL (ref 0.1–0.9)
NEUTROS PCT: 65 %
Neutrophils Absolute: 4.1 10*3/uL (ref 1.4–7.0)
Platelets: 216 10*3/uL (ref 150–379)
RBC: 4.94 x10E6/uL (ref 3.77–5.28)
RDW: 13.7 % (ref 12.3–15.4)
WBC: 6.3 10*3/uL (ref 3.4–10.8)

## 2016-02-28 LAB — TRYPTASE: Tryptase: 6.5 ug/L (ref 2.2–13.2)

## 2016-02-28 LAB — CHRONIC URTICARIA: cu index: 26.1 — ABNORMAL HIGH (ref <10)

## 2016-03-12 ENCOUNTER — Ambulatory Visit (INDEPENDENT_AMBULATORY_CARE_PROVIDER_SITE_OTHER): Payer: Medicare Other | Admitting: Allergy & Immunology

## 2016-03-12 VITALS — BP 132/88 | HR 100 | Temp 97.5°F | Resp 16

## 2016-03-12 DIAGNOSIS — L501 Idiopathic urticaria: Secondary | ICD-10-CM

## 2016-03-12 DIAGNOSIS — J302 Other seasonal allergic rhinitis: Secondary | ICD-10-CM

## 2016-03-12 DIAGNOSIS — T781XXD Other adverse food reactions, not elsewhere classified, subsequent encounter: Secondary | ICD-10-CM | POA: Diagnosis not present

## 2016-03-12 NOTE — Patient Instructions (Addendum)
1. Chronic urticaria - Take fexofenadine (Allegra) 360mg  (two tablets) in the morning and increase cetirizine (Zyrtec) 30mg  (two tablets) at night. - We will defer starting Xolair.  2. Shellfish allergy - Continue to avoid shellfish.  - Carry your EpiPen with you at all times.   2. Return in about 6 months (around 09/10/2016).  Please inform us of any Emergency Department visits, hospitalizations, or changes in symptoms. Call us before going to the ED for breathing or allergy symptoms since we might be able to fit you in for a sick visit. Feel free to contact us anytime with any questions, problems, or concerns.  It was a pleasure to see you again today! Email me candidates!!!

## 2016-03-12 NOTE — Progress Notes (Signed)
[id number]  FOLLOW UP  Date of Service/Encounter:  03/12/16   Assessment:   Chronic idiopathic urticaria  Adverse food reaction, subsequent encounter  Other seasonal allergic rhinitis   Plan/Recommendations:   1. Chronic urticaria - Take fexofenadine (Allegra) 360mg  (two tablets) in the morning and increase cetirizine (Zyrtec) 30mg  (two tablets) at night. - We will defer starting Xolair.  2. Shellfish allergy - Continue to avoid shellfish.  - Carry your EpiPen with you at all times.   3. Allergic rhinitis (ragweed, Alternaria, Cladosproium, dog, cockroach, dust mite)  - Antihistamines should help with any symptoms.  - No need for any other medications at this time. - No indication for immunotherapy.  4. Return in about 6 months (around 09/10/2016).   Subjective:   Theresa Moran is a 55 y.o. female presenting today for follow up of  Chief Complaint  Patient presents with  . Urticaria    hives today but has gotten better being on medication  .  Theresa Moran has a history of the following: Patient Active Problem List   Diagnosis Date Noted  . Chronic pain 03/08/2014  . Reflex sympathetic dystrophy of lower extremity 09/13/2013  . Depressive disorder 12/23/2012  . Back pain 06/10/2012    History obtained from: chart review and patient.  [name] was referred by No primary care provider on file.Theresa Moran is a 55 y.o. female presenting for a follow up visit. She was last seen for her initial visit on 02/12/16. At that time, she was complaining of urticaria that had gone on for three months. There were no concerning features for anaphylaxis or mastocytosis. Symptoms were not controlled with low dose antihistamines. Testing was notable for allergies to ragweed, Alternaria, Cladosproium, dog, cockroach, dust mite. We sent bloodwork which was notable for elevated levels of anti-FceR antibodies. CBC with tryptase were normal. We started her on Allegra 360mg  in the morning  and cetirizine 20mg  at night.  Since starting the antihistamines she continues to have hives but overall the intensity has decreased. She has them every day but they are less bothersome. She remains on the two Allegra in the morning and two cetirizine at night. She did have sedation from the cocktail for one week but she became used to it over time. She has no tried increasing the medications at home. Theresa Moran has thought about Xolair and did some reading, but she does not feel that she would like to take the risk given the side effects, notable anaphylaxis.   Theresa Moran continues to avoid shellfish. She has had no accidental ingestions. She does carry her epinephrine with her at all times. She does have the above mentioned sensitivities, but her allergic rhinitis symptoms have never been an issue. Otherwise, there have been no changes to her past medical history, surgical history, family history, or social history.    Review of Systems: a 14-point review of systems is pertinent for what is mentioned in HPI.  Otherwise, all other systems were negative. Constitutional: negative other than that listed in the HPI Eyes: negative other than that listed in the HPI Ears, nose, mouth, throat, and face: negative other than that listed in the HPI Respiratory: negative other than that listed in the HPI Cardiovascular: negative other than that listed in the HPI Gastrointestinal: negative other than that listed in the HPI Genitourinary: negative other than that listed in the HPI Integument: negative other than that listed in the HPI Hematologic: negative other than  that listed in the HPI Musculoskeletal: negative other than that listed in the HPI Neurological: negative other than that listed in the HPI Allergy/Immunologic: negative other than that listed in the HPI    Objective:   Blood pressure 132/88, pulse 100, temperature 97.5 F (36.4 C), temperature source Oral, resp. rate 16. There is no height  or weight on file to calculate BMI.   Physical Exam:  General: Alert, interactive, in no acute distress. Obese female. Cooperative with the exam.  HEENT: TMs pearly gray, turbinates edematous and pale with clear discharge, post-pharynx erythematous. Neck: Supple without thyromegaly. Lungs: Clear to auscultation without wheezing, rhonchi or rales. No increased work of breathing. CV: Normal S1/S2, no murmurs. Capillary refill <2 seconds.  Abdomen: Nondistended, nontender. No guarding or rebound tenderness. Bowel sounds faint  Skin: Warm and dry, without lesions or rashes. There are some urticarial lesions on the bilateral arms noted but less than previous exams.  Extremities:  No clubbing, cyanosis or edema. Neuro:   Grossly intact. No focal deficits noted  Diagnostic studies: None    [name], MD [place] [facility] [place]

## 2016-03-13 DIAGNOSIS — T781XXA Other adverse food reactions, not elsewhere classified, initial encounter: Secondary | ICD-10-CM | POA: Insufficient documentation

## 2016-03-13 DIAGNOSIS — J302 Other seasonal allergic rhinitis: Secondary | ICD-10-CM

## 2016-03-13 DIAGNOSIS — L501 Idiopathic urticaria: Secondary | ICD-10-CM | POA: Insufficient documentation

## 2016-03-13 HISTORY — DX: Other seasonal allergic rhinitis: J30.2

## 2016-09-10 ENCOUNTER — Encounter: Payer: Self-pay | Admitting: Allergy & Immunology

## 2016-09-10 ENCOUNTER — Ambulatory Visit (INDEPENDENT_AMBULATORY_CARE_PROVIDER_SITE_OTHER): Payer: Medicare Other | Admitting: Allergy & Immunology

## 2016-09-10 VITALS — BP 132/84 | HR 88 | Resp 16

## 2016-09-10 DIAGNOSIS — J301 Allergic rhinitis due to pollen: Secondary | ICD-10-CM | POA: Diagnosis not present

## 2016-09-10 DIAGNOSIS — L501 Idiopathic urticaria: Secondary | ICD-10-CM | POA: Diagnosis not present

## 2016-09-10 DIAGNOSIS — T781XXD Other adverse food reactions, not elsewhere classified, subsequent encounter: Secondary | ICD-10-CM

## 2016-09-10 NOTE — Patient Instructions (Addendum)
1. 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 - We will defer starting Xolair.  2. Shellfish allergy - Continue to avoid shellfish.  - Carry your EpiPen with you at all times.   2. Return in about 1 year (around 09/10/2017).  Please inform us of any Emergency Department visits, hospitalizations, or changes in symptoms. Call us before going to the ED for breathing or allergy symptoms since we might be able to fit you in for a sick visit. Feel free to contact us anytime with any questions, problems, or concerns.  It was a pleasure to see you again today!

## 2016-09-10 NOTE — Progress Notes (Signed)
FOLLOW UP  Date of Service/Encounter:  09/10/16   Assessment:   Chronic idiopathic urticaria - Positive anti-FceR antibodies  Shellfish allergy  Non-seasonal allergic rhinitis (ragweed, Alternaria, Cladosporium, dog, cockroach, dust mites)  Complex regional pain syndrome - stable  Plan/Recommendations:   1. 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 - We will defer starting Xolair.  2. Shellfish allergy - Continue to avoid shellfish.  - Carry your EpiPen with you at all times.   3. Allergic rhinitis (ragweed, Alternaria, Cladosporium, dog, cockroach, dust mites) - Stable without treatment. - There is no indication for allergen immunotherapy at this time.   4. Follow up in one year or earlier if needed.     Subjective:   KIERRAH KILBRIDE is a 56 y.o. female presenting today for follow up of  Chief Complaint  Patient presents with  . Urticaria    doing better    [name] has a history of the following: Patient Active Problem List   Diagnosis Date Noted  . Chronic idiopathic urticaria 03/13/2016  . Adverse food reaction 03/13/2016  . Allergic rhinitis 03/13/2016  . Chronic pain 03/08/2014  . Reflex sympathetic dystrophy of lower extremity 09/13/2013  . Depressive disorder 12/23/2012  . Back pain 06/10/2012    History obtained from: chart review and patient.  [name] was referred by No PCP Per Patient.     Gael is a 56 y.o. female presenting for a follow up visit. She was last seen in October 2017 to manage her chronic urticaria in the setting of elevated levels of anti-FceR antibodies. At that time, I recommended continued suppressive doses of antihistamines including 2 tablets of Allegra in the morning and 2-3 tablets of Zyrtec at night. She does have a history of shellfish allergy, and her EpiPen was up-to-date. She has a history of allergic rhinitis with  testing that was positive to ragweed, Alternaria, Cladosporium, dog, cockroach, and dust mites.  Since last visit, she has done very well. She remains on cetirizine two tablets at night. She will sometimes have flares requiring an extra dose of antihistamine. Overall it is markedly improved compared to previous years. She has had no problems with anaphylaxis involved reactions.   Ms. Gassner continues to avoid shellfish. She has had no accidental ingestions. She does carry her epinephrine with her at all times. She does have the above mentioned sensitivities, but her allergic rhinitis symptoms have never been an issue, aside from when they lived in a house that was overcome with mold decades ago.  Otherwise, there have been no changes to her past medical history, surgical history, family history, or social history. Her social history remains unchanged. She is currently disabled secondary to her complex regional pain disorder. She spends her time taking care of her 4 grandchildren (children of her son, who is incarcerated) as well as her 44 year old daughter who is currently attending [employer] and is aiming to obtain a degree in genetics. Her wife of 21 years works as Development worker, international aid for [employer].    Review of Systems: a 14-point review of systems is pertinent for what is mentioned in HPI.  Otherwise, all other systems were negative. Constitutional: negative other than that listed in the HPI Eyes: negative other than that listed in the HPI Ears, nose, mouth, throat, and face: negative other than that listed in the HPI Respiratory: negative other than that listed in the HPI Cardiovascular:  negative other than that listed in the HPI Gastrointestinal: negative other than that listed in the HPI Genitourinary: negative other than that listed in the HPI Integument: negative other than that listed in the HPI Hematologic: negative other than that listed in the HPI Musculoskeletal: negative other  than that listed in the HPI Neurological: negative other than that listed in the HPI Allergy/Immunologic: negative other than that listed in the HPI    Objective:   Blood pressure 132/84, pulse 88, resp. rate 16. There is no height or weight on file to calculate BMI.   Physical Exam:  General: Alert, interactive, in no acute distress. Pleasant talkative female.  Eyes: No conjunctival injection present on the right, No conjunctival injection present on the left, PERRL bilaterally, No discharge on the right, No discharge on the left and No Horner-Trantas dots present Ears: Right TM pearly gray with normal light reflex, Left TM pearly gray with normal light reflex, Right TM intact without perforation and Left TM intact without perforation.  Nose/Throat: External nose within normal limits, nasal crease present and septum midline, turbinates minimally edematous without discharge, post-pharynx mildly erythematous without cobblestoning in the posterior oropharynx. Tonsils 2+ without exudates Neck: Supple without thyromegaly. Lungs: Clear to auscultation without wheezing, rhonchi or rales. No increased work of breathing. CV: Normal S1/S2, no murmurs. Capillary refill <2 seconds.  Skin: Warm and dry, without lesions or rashes. Neuro:   Grossly intact. No focal deficits appreciated. Responsive to questions.   Diagnostic studies: none      [name], MD [facility]

## 2018-01-31 ENCOUNTER — Other Ambulatory Visit: Payer: Self-pay | Admitting: Family Medicine

## 2018-03-22 ENCOUNTER — Other Ambulatory Visit: Payer: Self-pay

## 2018-03-22 ENCOUNTER — Encounter: Payer: Self-pay | Admitting: Family Medicine

## 2018-03-22 ENCOUNTER — Ambulatory Visit (INDEPENDENT_AMBULATORY_CARE_PROVIDER_SITE_OTHER): Payer: Medicare Other | Admitting: Family Medicine

## 2018-03-22 VITALS — BP 131/85 | HR 113 | Temp 97.6°F | Resp 16 | Ht 65.0 in | Wt 257.8 lb

## 2018-03-22 DIAGNOSIS — Z13220 Encounter for screening for lipoid disorders: Secondary | ICD-10-CM

## 2018-03-22 DIAGNOSIS — Z1329 Encounter for screening for other suspected endocrine disorder: Secondary | ICD-10-CM

## 2018-03-22 DIAGNOSIS — Z23 Encounter for immunization: Secondary | ICD-10-CM

## 2018-03-22 DIAGNOSIS — Z131 Encounter for screening for diabetes mellitus: Secondary | ICD-10-CM

## 2018-03-22 DIAGNOSIS — Z0000 Encounter for general adult medical examination without abnormal findings: Secondary | ICD-10-CM | POA: Diagnosis not present

## 2018-03-22 DIAGNOSIS — Z1239 Encounter for other screening for malignant neoplasm of breast: Secondary | ICD-10-CM

## 2018-03-22 DIAGNOSIS — Z1159 Encounter for screening for other viral diseases: Secondary | ICD-10-CM

## 2018-03-22 DIAGNOSIS — Z124 Encounter for screening for malignant neoplasm of cervix: Secondary | ICD-10-CM

## 2018-03-22 MED ORDER — EPINEPHRINE 0.3 MG/0.3ML IJ SOAJ: 0.3000 mg | Freq: Once | INTRAMUSCULAR | 0 refills | Status: DC

## 2018-03-22 NOTE — Patient Instructions (Addendum)
If you have lab work done today you will be contacted with your lab results within the next 2 weeks.  If you have not heard from [place] then please contact us. The fastest way to get your results is to register for My Chart.   IF you received an x-ray today, you will receive an invoice from [facility]. Please contact [facility] at [phone] with questions or concerns regarding your invoice.   IF you received labwork today, you will receive an invoice from [facility]. Please contact [facility] at [phone] with questions or concerns regarding your invoice.   Our billing staff will not be able to assist you with questions regarding bills from these companies.  You will be contacted with the lab results as soon as they are available. The fastest way to get your results is to activate your My Chart account. Instructions are located on the last page of this paperwork. If you have not heard from [place] regarding the results in 2 weeks, please contact this office.      Cancer Screening for Women A cancer screening is a test or exam that checks for cancer. Your health care provider will recommend specific cancer screenings based on your age, personal history, and family history of cancer. Work with your health care provider to create a cancer screening schedule that protects your health. Why is cancer screening done? Cancer screening is done to look for cancer in the very early stages, before it spreads and becomes harder to treat and before you would start to notice symptoms. Finding cancer early improves the chances of successful treatment. It may save your life. Who should be screened for cancer? All women should be screened for certain cancers, including breast cancer, cervical cancer, and skin cancer. Your health care provider may recommend screenings for other types of cancer if:  You had cancer before.  You have a family member with cancer.  You have abnormal genes that  could increase the risk of cancer.  You have risk factors for certain cancers, such as smoking.  When you should be screened for cancer depends on:  Your age.  Your medical history and your family's medical history.  Certain lifestyle factors, such as smoking.  Environmental exposure, such as to asbestos.  What are some common cancer screenings? Breast cancer Breast cancer screening is done with a test that takes images of breast tissue (mammogram). Here are some screening guidelines:  When you are age 3-44. you will be given the choice to start having mammograms.  When you are age 56-54, you should have a mammogram every year.  You may start having mammograms before age 64 if you have risk factors for breast cancer, such as having an immediate family member with breast cancer.  When you are age 60 or older, you should have a mammogram every 1-2 years for as long as you are in good health and have a life expectancy of 10 years or more.  It is important to know what your breasts look and feel like so you can report any changes to your health care provider.  Cervical cancer Cervical cancer screening is done with a Pap test. This testchecks for abnormalities, including the virus that causes cervical cancer (human papillomavirus, or HPV). To perform the test, a health care provider takes a swab of cervical cells during a pelvic exam. Screening for cervical cancer with a Pap test should start at age 1. Here are some screening guidelines:  When  you are age 24-29, you should have a Pap test every 3 years.  When you are age 35-65, you should have a Pap test and HPV test every 5 years or have a Pap test every 3 years.  You may be screened for cervical cancer more often if you have risk factors for cervical cancer.  If your Pap tests are abnormal, you may have an HPV test.  If you have had the HPV vaccine, you will still be screened for cervical cancer and follow normal screening  recommendations.  You do not need to be screened for cervical cancer if any of the following apply to you:  You are older than age 29 and you have not had a serious cervical precancer or cancer in the last 20 years.  Your cervix and uterus have been removed and you have never had cervical cancer or precancerous cells.  Endometrial cancer There is no standard screening test for endometrial cancer, but the cancer can be detected with:  A test of a sample of tissue taken from the lining of the uterus (endometrial tissue biopsy).  A vaginal ultrasound.  Pap tests.  If you are at increased risk for endometrial cancer, you may need to have these tests more often than normal. You are at increased risk if:  You have a family history of ovarian, uterine, or colon cancer.  You are taking tamoxifen, a drug that is used to treat breast cancer.  You have certain types of colon cancer.  If you have reached menopause, it is especially important to talk with your health care provider about any vaginal bleeding or spotting. Screening for endometrial cancer is not recommended for women who do not have symptoms of the cancer, such as vaginal bleeding. Colorectal cancer Screening for colorectal cancer is recommended starting at age 38 for most women. If you have a family history of colon or rectal cancer or other risk factors, you may need to start having screenings earlier. Talk with your health care provider about which screening test is right for you and how often you should be screened. Colorectal cancer screening looks for cancer or for growths called polyps that often form before cancer starts. Tests to look for cancer or polyps include:  Colonoscopy or flexible sigmoidoscopy. For these procedures, a flexible tube with a small camera is inserted into the rectum.  CT colonography. This test uses X-rays and a contrast dye to check the colon for polyps. If a polyp is found, you may need to have a  colonoscopy so the polyp can be located and removed.  Tests to look for cancer in the stool (feces) include:  Guaiac-based fecal occult blood test (FOBT). This test detects blood in stool. It can be done at home with a kit.  Fecal immunochemical test (FIT). This test detects blood in stool. For this test, you will need to collect stool samples at home.  Stool DNA test. This test looks for blood in stool and any changes in DNA that can lead to colon cancer. For this test, you will need to collect a stool sample at home and send it to a lab.  Skin cancer Skin cancer screening is done by checking the skin for unusual moles or spots and any changes in existing moles. Your health care provider should check your skin for signs of skin cancer at every physical exam. You should check your skin every month and tell your health care provider right away if anything looks unusual. Women  with a higher-than-normal risk for skin cancer may want to see a skin specialist (dermatologist) for an annual body check. Lung cancer Lung cancer screening is done with a CT scan that looks for abnormal cells in the lungs. Discuss lung cancer screening with your health care provider if you are 61-33 years old and if any of the following apply to you:  You currently smoke.  You used to smoke heavily.  You have had at least a 30-pack-year smoking history.  You have quit smoking within the past 15 years.  If you smoke heavily or if you used to smoke, you may need to be screened every year. Where to find more information:  Onset: SkinPromotion.no  Centers for Disease Control and Prevention: http://knight-sullivan.biz/  Department of Health and Human Services: BankingDetective.si Contact a health care provider if:  You have concerns about any signs or symptoms of cancer, such  as: ? Moles that have an unusual shape or color. ? Changes in existing moles. ? A sore on your skin that does not heal. ? Blood in your stool. ? Fatigue that does not go away. ? Frequent pain or cramping in your abdomen. ? Coughing, or coughing up blood. ? Losing weight without trying. ? Lumps or other changes in your breasts. ? Vaginal bleeding, spotting, or changes in your periods. Summary  Be aware of and watch for signs and symptoms of cancer, especially symptoms of breast cancer, cervical cancer, endometrial cancer, colorectal cancer, skin cancer, and lung cancer.  Early detection of cancer with cancer screening may save your life.  Talk with your health care provider about your specific cancer risks.  Work together with your health care provider to create a cancer screening plan that is right for you. This information is not intended to replace advice given to you by your health care provider. Make sure you discuss any questions you have with your health care provider. Document Released: 02/06/2016 Document Revised: 02/06/2016 Document Reviewed: 02/06/2016 Elsevier Interactive Patient Education  [employer].

## 2018-03-22 NOTE — Progress Notes (Signed)
Chief Complaint  Patient presents with  . Annual Exam    fall 1 month ago, need a new rx for epi pens    Subjective:  Theresa Moran is a 57 y.o. female here for a health maintenance visit.  Patient is established pt  She has not had a pap smear in 10 years She was getting her mammogram [facility] the [facility]  She reports that they needed an order for mammogram    Patient Active Problem List   Diagnosis Date Noted  . Chronic idiopathic urticaria 03/13/2016  . Adverse food reaction 03/13/2016  . Allergic rhinitis 03/13/2016  . Chronic pain 03/08/2014  . Reflex sympathetic dystrophy of lower extremity 09/13/2013  . Depressive disorder 12/23/2012  . Back pain 06/10/2012    Past Medical History:  Diagnosis Date  . Allergy   . Asthma   . Chronic pain   . Depression   . Gallstones   . GERD (gastroesophageal reflux disease)   . Neuromuscular disorder ([place])   . Urticaria     Past Surgical History:  Procedure Laterality Date  . APPENDECTOMY    . BREAST REDUCTION SURGERY    . BREAST SURGERY    . CHOLECYSTECTOMY    . NASAL SEPTUM SURGERY       Outpatient Medications Prior to Visit  Medication Sig Dispense Refill  . b complex vitamins tablet Take 1 tablet by mouth daily.    . baclofen (LIORESAL) 10 MG tablet Take 10 mg by mouth 2 (two) times daily.    . Cholecalciferol (D3-1000 PO) Take 1,000 mg by mouth.    . Gabapentin, Once-Daily, (GRALISE) 600 MG TABS Take by mouth.    . nefazodone (SERZONE) 200 MG tablet Frequency:   Dosage:0.0     Instructions:  Note:    . nortriptyline (PAMELOR) 10 MG capsule Take 10 mg by mouth at bedtime.    Marland Kitchen omeprazole (PRILOSEC) 20 MG capsule Frequency:   Dosage:0.0     Instructions:  Note:    . Tapentadol HCl (NUCYNTA ER) 150 MG TB12 take 1 tablet by mouth twice a day    . traMADol (ULTRAM) 50 MG tablet Take by mouth every 6 (six) hours as needed.    Marland Kitchen EPINEPHrine (EPIPEN 2-PAK) 0.3 mg/0.3 mL IJ SOAJ injection Inject 0.3 mg into the muscle  once.    . cetirizine (ZYRTEC) 10 MG tablet Take 1 tablet (10 mg total) by mouth daily. (Patient not taking: Reported on 03/22/2018) 30 tablet 11  . diclofenac sodium (VOLTAREN) 1 % GEL Apply topically.    . fexofenadine (ALLEGRA) 180 MG tablet Take 2 tablets (360 mg total) by mouth every morning. (Patient not taking: Reported on 09/10/2016) 60 tablet 5   Facility-Administered Medications Prior to Visit  Medication Dose Route Frequency Provider Last Rate Last Dose  . 0.9 %  sodium chloride infusion  500 mL Intravenous Continuous Lafayette Dragon, MD        Allergies  Allergen Reactions  . Fish-Derived Products   . Shellfish-Derived Products      Family History  Problem Relation Age of Onset  . Diabetes Father   . Stroke Father   . Allergic rhinitis Neg Hx   . Angioedema Neg Hx   . Asthma Neg Hx   . Eczema Neg Hx   . Immunodeficiency Neg Hx   . Urticaria Neg Hx      Health Habits: Dental Exam: not up to date Eye Exam: not up to date Exercise:  times/week on average Current exercise activities: walking/running Diet: balanced   Social History   Socioeconomic History  . Marital status: Married    Spouse name: Not on file  . Number of children: Not on file  . Years of education: Not on file  . Highest education level: Not on file  Occupational History  . Not on file  Social Needs  . Financial resource strain: Not on file  . Food insecurity:    Worry: Not on file    Inability: Not on file  . Transportation needs:    Medical: Not on file    Non-medical: Not on file  Tobacco Use  . Smoking status: Former Smoker    Packs/day: 1.00    Years: 15.00    Pack years: 15.00    Types: Cigarettes  . Smokeless tobacco: Never Used  Substance and Sexual Activity  . Alcohol use: No  . Drug use: No  . Sexual activity: Yes  Lifestyle  . Physical activity:    Days per week: Not on file    Minutes per session: Not on file  . Stress: Not on file  Relationships  . Social  connections:    Talks on phone: Not on file    Gets together: Not on file    Attends religious service: Not on file    Active member of club or organization: Not on file    Attends meetings of clubs or organizations: Not on file    Relationship status: Not on file  . Intimate partner violence:    Fear of current or ex partner: Not on file    Emotionally abused: Not on file    Physically abused: Not on file    Forced sexual activity: Not on file  Other Topics Concern  . Not on file  Social History Narrative  . Not on file   Social History   Substance and Sexual Activity  Alcohol Use No   Social History   Tobacco Use  Smoking Status Former Smoker  . Packs/day: 1.00  . Years: 15.00  . Pack years: 15.00  . Types: Cigarettes  Smokeless Tobacco Never Used   Social History   Substance and Sexual Activity  Drug Use No    GYN: Sexual Health Menstrual status: regular menses LMP: No LMP recorded. Patient is postmenopausal. Last pap smear: see HM section History of abnormal pap smears:  Sexually active: with female partner Current contraception: n/a  Health Maintenance: See under health Maintenance activity for review of completion dates as well. Immunization History  Administered Date(s) Administered  . Influenza Split 02/28/2013  . Influenza,inj,Quad PF,6+ Mos 03/22/2018  . Tdap 03/22/2018      Depression Screen-PHQ2/9 Depression screen [facility] 2/9 03/22/2018 12/24/2015 12/16/2014  Decreased Interest 0 0 0  Down, Depressed, Hopeless 0 0 0  PHQ - 2 Score 0 0 0     Depression Severity and Treatment Recommendations:  0-4= None  5-9= Mild / Treatment: Support, educate to call if worse; return in one month  10-14= Moderate / Treatment: Support, watchful waiting; Antidepressant or Psycotherapy  15-19= Moderately severe / Treatment: Antidepressant OR Psychotherapy  >= 20 = Major depression, severe / Antidepressant AND Psychotherapy    Review of Systems   Review of  Systems  Constitutional: Negative for fever.  HENT: Negative for ear discharge, ear pain, hearing loss and tinnitus.   Eyes: Negative for blurred vision, double vision, photophobia and pain.  Respiratory: Negative for cough, shortness of breath and wheezing.  Cardiovascular: Negative for chest pain, palpitations and orthopnea.  Gastrointestinal: Negative for abdominal pain, nausea and vomiting.  Genitourinary: Negative for dysuria, frequency and urgency.  Musculoskeletal: Negative for joint pain and myalgias.  Skin: Negative for itching and rash.  Neurological: Negative for dizziness, tingling, tremors, sensory change and headaches.  Psychiatric/Behavioral: Negative for depression. The patient is not nervous/anxious.        Objective:   Vitals:   03/22/18 0958  BP: 131/85  Pulse: (!) 113  Resp: 16  Temp: 97.6 F (36.4 C)  TempSrc: Oral  SpO2: 96%  Weight: 257 lb 12.8 oz (116.9 kg)  Height: '5\' 5"'  (1.651 m)    Body mass index is 42.9 kg/m.  Physical Exam  BP 131/85 (BP Location: Right Arm, Patient Position: Sitting, Cuff Size: Large)   Pulse (!) 113   Temp 97.6 F (36.4 C) (Oral)   Resp 16   Ht '5\' 5"'  (1.651 m)   Wt 257 lb 12.8 oz (116.9 kg)   SpO2 96%   BMI 42.90 kg/m   General Appearance:    Alert, cooperative, no distress, appears stated age  Head:    Normocephalic, without obvious abnormality, atraumatic  Eyes:    PERRL, conjunctiva/corneas clear, EOM's intact, fundi    benign, both eyes  Ears:    Normal TM's and external ear canals, both ears  Nose:   Nares normal, septum midline, mucosa normal, no drainage    or sinus tenderness  Throat:   Lips, mucosa, and tongue normal; teeth and gums normal  Neck:   Supple, symmetrical, trachea midline, no adenopathy;    thyroid:  no enlargement/tenderness/nodules; no carotid   bruit or JVD  Back:     Symmetric, no curvature, ROM normal, no CVA tenderness  Lungs:     Clear to auscultation bilaterally, respirations  unlabored  Chest Wall:    No tenderness or deformity   Heart:    Regular rate and rhythm, S1 and S2 normal, no murmur, rub   or gallop  Breast Exam:    No tenderness, masses, or nipple abnormality, scars from breast reduction  Abdomen:     Soft, non-tender, bowel sounds active all four quadrants,    no masses, no organomegaly  Genitalia:    Normal female without lesion, discharge or tenderness, labia majora with small sebaceous cyst, bimanual exam did not reveal palpable masses, pap smear performed  Extremities:   Extremities normal, atraumatic, no cyanosis or edema  Pulses:   2+ and symmetric all extremities  Skin:   Skin color, texture, turgor normal, no rashes or lesions  Lymph nodes:   Cervical, supraclavicular, and axillary nodes normal  Neurologic:   CNII-XII intact, normal strength, sensation and reflexes    throughout      Assessment/Plan:   Patient was seen for a health maintenance exam.  Counseled the patient on health maintenance issues. Reviewed her health mainteance schedule and ordered appropriate tests (see orders.) Counseled on regular exercise and weight management. Recommend regular eye exams and dental cleaning.   The following issues were addressed today for health maintenance:   Aaliah was seen today for annual exam.  Diagnoses and all orders for this visit:  Encounter for health maintenance examination in adult- well woman performed  Screening for breast cancer- discussed mammogram and performed breast exam today without abnormal findings   Screening for diabetes mellitus- will check her glucose  -     Comprehensive metabolic panel  Screening, lipid -  Lipid panel  Screening for thyroid disorder -     TSH  Need for hepatitis C screening test -     HCV Ab w/Rflx to Verification  Screening for cervical cancer -     Pap IG, CT/NG NAA, and HPV (high risk)  Morbid obesity (HCC)  Other orders -     Flu Vaccine QUAD 36+ mos IM -     Tdap vaccine  greater than or equal to 7yo IM -     EPINEPHrine (EPIPEN 2-PAK) 0.3 mg/0.3 mL IJ SOAJ injection; Inject 0.3 mLs (0.3 mg total) into the muscle once for 1 dose.    Return in about 1 year (around 03/23/2019) for physical exam .    Body mass index is 42.9 kg/m.:  Discussed the patient's BMI with patient. The BMI body mass index is 42.9 kg/m.     No future appointments.  Patient Instructions       If you have lab work done today you will be contacted with your lab results within the next 2 weeks.  If you have not heard from [place] then please contact us. The fastest way to get your results is to register for My Chart.   IF you received an x-ray today, you will receive an invoice from [facility]. Please contact [facility] at [phone] with questions or concerns regarding your invoice.   IF you received labwork today, you will receive an invoice from [facility]. Please contact [facility] at [phone] with questions or concerns regarding your invoice.   Our billing staff will not be able to assist you with questions regarding bills from these companies.  You will be contacted with the lab results as soon as they are available. The fastest way to get your results is to activate your My Chart account. Instructions are located on the last page of this paperwork. If you have not heard from [place] regarding the results in 2 weeks, please contact this office.      Cancer Screening for Women A cancer screening is a test or exam that checks for cancer. Your health care provider will recommend specific cancer screenings based on your age, personal history, and family history of cancer. Work with your health care provider to create a cancer screening schedule that protects your health. Why is cancer screening done? Cancer screening is done to look for cancer in the very early stages, before it spreads and becomes harder to treat and before you would start to notice symptoms.  Finding cancer early improves the chances of successful treatment. It may save your life. Who should be screened for cancer? All women should be screened for certain cancers, including breast cancer, cervical cancer, and skin cancer. Your health care provider may recommend screenings for other types of cancer if:  You had cancer before.  You have a family member with cancer.  You have abnormal genes that could increase the risk of cancer.  You have risk factors for certain cancers, such as smoking.  When you should be screened for cancer depends on:  Your age.  Your medical history and your family's medical history.  Certain lifestyle factors, such as smoking.  Environmental exposure, such as to asbestos.  What are some common cancer screenings? Breast cancer Breast cancer screening is done with a test that takes images of breast tissue (mammogram). Here are some screening guidelines:  When you are age 54-44. you will be given the choice to start having mammograms.  When you are age  45-54, you should have a mammogram every year.  You may start having mammograms before age 48 if you have risk factors for breast cancer, such as having an immediate family member with breast cancer.  When you are age 68 or older, you should have a mammogram every 1-2 years for as long as you are in good health and have a life expectancy of 10 years or more.  It is important to know what your breasts look and feel like so you can report any changes to your health care provider.  Cervical cancer Cervical cancer screening is done with a Pap test. This testchecks for abnormalities, including the virus that causes cervical cancer (human papillomavirus, or HPV). To perform the test, a health care provider takes a swab of cervical cells during a pelvic exam. Screening for cervical cancer with a Pap test should start at age 52. Here are some screening guidelines:  When you are age 23-29, you should have a  Pap test every 3 years.  When you are age 79-65, you should have a Pap test and HPV test every 5 years or have a Pap test every 3 years.  You may be screened for cervical cancer more often if you have risk factors for cervical cancer.  If your Pap tests are abnormal, you may have an HPV test.  If you have had the HPV vaccine, you will still be screened for cervical cancer and follow normal screening recommendations.  You do not need to be screened for cervical cancer if any of the following apply to you:  You are older than age 57 and you have not had a serious cervical precancer or cancer in the last 20 years.  Your cervix and uterus have been removed and you have never had cervical cancer or precancerous cells.  Endometrial cancer There is no standard screening test for endometrial cancer, but the cancer can be detected with:  A test of a sample of tissue taken from the lining of the uterus (endometrial tissue biopsy).  A vaginal ultrasound.  Pap tests.  If you are at increased risk for endometrial cancer, you may need to have these tests more often than normal. You are at increased risk if:  You have a family history of ovarian, uterine, or colon cancer.  You are taking tamoxifen, a drug that is used to treat breast cancer.  You have certain types of colon cancer.  If you have reached menopause, it is especially important to talk with your health care provider about any vaginal bleeding or spotting. Screening for endometrial cancer is not recommended for women who do not have symptoms of the cancer, such as vaginal bleeding. Colorectal cancer Screening for colorectal cancer is recommended starting at age 75 for most women. If you have a family history of colon or rectal cancer or other risk factors, you may need to start having screenings earlier. Talk with your health care provider about which screening test is right for you and how often you should be screened. Colorectal  cancer screening looks for cancer or for growths called polyps that often form before cancer starts. Tests to look for cancer or polyps include:  Colonoscopy or flexible sigmoidoscopy. For these procedures, a flexible tube with a small camera is inserted into the rectum.  CT colonography. This test uses X-rays and a contrast dye to check the colon for polyps. If a polyp is found, you may need to have a colonoscopy so the polyp can be located  and removed.  Tests to look for cancer in the stool (feces) include:  Guaiac-based fecal occult blood test (FOBT). This test detects blood in stool. It can be done at home with a kit.  Fecal immunochemical test (FIT). This test detects blood in stool. For this test, you will need to collect stool samples at home.  Stool DNA test. This test looks for blood in stool and any changes in DNA that can lead to colon cancer. For this test, you will need to collect a stool sample at home and send it to a lab.  Skin cancer Skin cancer screening is done by checking the skin for unusual moles or spots and any changes in existing moles. Your health care provider should check your skin for signs of skin cancer at every physical exam. You should check your skin every month and tell your health care provider right away if anything looks unusual. Women with a higher-than-normal risk for skin cancer may want to see a skin specialist (dermatologist) for an annual body check. Lung cancer Lung cancer screening is done with a CT scan that looks for abnormal cells in the lungs. Discuss lung cancer screening with your health care provider if you are 55-83 years old and if any of the following apply to you:  You currently smoke.  You used to smoke heavily.  You have had at least a 30-pack-year smoking history.  You have quit smoking within the past 15 years.  If you smoke heavily or if you used to smoke, you may need to be screened every year. Where to find more  information:  [place]: SkinPromotion.no  Centers for Disease Control and Prevention: http://knight-sullivan.biz/  Department of Health and Human Services: BankingDetective.si Contact a health care provider if:  You have concerns about any signs or symptoms of cancer, such as: ? Moles that have an unusual shape or color. ? Changes in existing moles. ? A sore on your skin that does not heal. ? Blood in your stool. ? Fatigue that does not go away. ? Frequent pain or cramping in your abdomen. ? Coughing, or coughing up blood. ? Losing weight without trying. ? Lumps or other changes in your breasts. ? Vaginal bleeding, spotting, or changes in your periods. Summary  Be aware of and watch for signs and symptoms of cancer, especially symptoms of breast cancer, cervical cancer, endometrial cancer, colorectal cancer, skin cancer, and lung cancer.  Early detection of cancer with cancer screening may save your life.  Talk with your health care provider about your specific cancer risks.  Work together with your health care provider to create a cancer screening plan that is right for you. This information is not intended to replace advice given to you by your health care provider. Make sure you discuss any questions you have with your health care provider. Document Released: 02/06/2016 Document Revised: 02/06/2016 Document Reviewed: 02/06/2016 Elsevier Interactive Patient Education  [employer].

## 2018-03-23 LAB — LIPID PANEL
CHOLESTEROL TOTAL: 187 mg/dL (ref 100–199)
Chol/HDL Ratio: 4.5 ratio — ABNORMAL HIGH (ref 0.0–4.4)
HDL: 42 mg/dL (ref >39)
LDL CALC: 120 mg/dL — AB (ref 0–99)
Triglycerides: 127 mg/dL (ref 0–149)
VLDL Cholesterol Cal: 25 mg/dL (ref 5–40)

## 2018-03-23 LAB — COMPREHENSIVE METABOLIC PANEL
A/G RATIO: 1.6 (ref 1.2–2.2)
ALT: 36 IU/L — AB (ref 0–32)
AST: 38 IU/L (ref 0–40)
Albumin: 4.7 g/dL (ref 3.5–5.5)
Alkaline Phosphatase: 92 IU/L (ref 39–117)
BUN/Creatinine Ratio: 14 (ref 9–23)
BUN: 14 mg/dL (ref 6–24)
Bilirubin Total: 0.3 mg/dL (ref 0.0–1.2)
CALCIUM: 9.6 mg/dL (ref 8.7–10.2)
CO2: 24 mmol/L (ref 20–29)
CREATININE: 0.98 mg/dL (ref 0.57–1.00)
Chloride: 102 mmol/L (ref 96–106)
GFR calc Af Amer: 73 mL/min/{1.73_m2} (ref >59)
GFR, EST NON AFRICAN AMERICAN: 63 mL/min/{1.73_m2} (ref >59)
GLOBULIN, TOTAL: 3 g/dL (ref 1.5–4.5)
Glucose: 94 mg/dL (ref 65–99)
POTASSIUM: 4.9 mmol/L (ref 3.5–5.2)
SODIUM: 141 mmol/L (ref 134–144)
Total Protein: 7.7 g/dL (ref 6.0–8.5)

## 2018-03-23 LAB — HCV INTERPRETATION

## 2018-03-23 LAB — TSH: TSH: 1.65 u[IU]/mL (ref 0.450–4.500)

## 2018-03-23 LAB — HCV AB W/RFLX TO VERIFICATION: HCV Ab: <0.1 s/co ratio (ref 0.0–0.9)

## 2018-03-26 LAB — PAP IG, CT-NG NAA, HPV HIGH-RISK
Chlamydia, Nuc. Acid Amp: NEGATIVE
Gonococcus by Nucleic Acid Amp: NEGATIVE
HPV, HIGH-RISK: NEGATIVE

## 2019-08-01 ENCOUNTER — Telehealth: Payer: Self-pay | Admitting: Family Medicine

## 2019-08-01 NOTE — Telephone Encounter (Signed)
patient dropped off [employer] parking placard to be completed by provider / put in provider / [place] bos at nurses station

## 2019-08-02 NOTE — Telephone Encounter (Signed)
This pt is looking for [facility] paperwork for Dr. Nolon Rod to fill out did you receive this?

## 2019-08-02 NOTE — Telephone Encounter (Signed)
Did you get this paperwork by any chance?  Please Advise.

## 2019-08-04 NOTE — Telephone Encounter (Signed)
Placed [employer] parking placard form in dr. Nolon Rod red folder ready for her completion.

## 2019-08-11 NOTE — Telephone Encounter (Signed)
Pt has been called and spoken to. Paperwork has been placed downstairs to be picked up today. 08/11/19

## 2019-11-09 ENCOUNTER — Encounter: Payer: Medicare Other | Admitting: Family Medicine

## 2019-12-18 ENCOUNTER — Encounter: Payer: Self-pay | Admitting: Family Medicine

## 2019-12-18 ENCOUNTER — Other Ambulatory Visit: Payer: Self-pay | Admitting: Family Medicine

## 2019-12-18 ENCOUNTER — Other Ambulatory Visit: Payer: Self-pay

## 2019-12-18 ENCOUNTER — Ambulatory Visit (INDEPENDENT_AMBULATORY_CARE_PROVIDER_SITE_OTHER): Payer: Medicare Other | Admitting: Family Medicine

## 2019-12-18 VITALS — BP 136/85 | HR 112 | Temp 98.0°F | Ht 65.0 in | Wt 286.0 lb

## 2019-12-18 DIAGNOSIS — R002 Palpitations: Secondary | ICD-10-CM | POA: Diagnosis not present

## 2019-12-18 DIAGNOSIS — R1310 Dysphagia, unspecified: Secondary | ICD-10-CM | POA: Diagnosis not present

## 2019-12-18 DIAGNOSIS — G90522 Complex regional pain syndrome I of left lower limb: Secondary | ICD-10-CM

## 2019-12-18 DIAGNOSIS — Z1231 Encounter for screening mammogram for malignant neoplasm of breast: Secondary | ICD-10-CM

## 2019-12-18 DIAGNOSIS — F329 Major depressive disorder, single episode, unspecified: Secondary | ICD-10-CM

## 2019-12-18 DIAGNOSIS — F32A Depression, unspecified: Secondary | ICD-10-CM

## 2019-12-18 MED ORDER — OMEPRAZOLE 20 MG PO CPDR: 20.0000 mg | DELAYED_RELEASE_CAPSULE | Freq: Every day | ORAL | 3 refills | Status: DC

## 2019-12-18 MED ORDER — BUSPIRONE HCL 5 MG PO TABS: 5.0000 mg | ORAL_TABLET | Freq: Every day | ORAL | 5 refills | Status: DC

## 2019-12-18 MED ORDER — METOPROLOL SUCCINATE ER 25 MG PO TB24
12.5000 mg | ORAL_TABLET | Freq: Every day | ORAL | 0 refills | Status: DC
Start: 2019-12-18 — End: 2020-02-12

## 2019-12-18 NOTE — Progress Notes (Signed)
7/26/20212:38 PM  [name] January 08, 1961, 59 y.o., female [id number]  Chief Complaint  Patient presents with  . Transitions Of Care  . trouble swallowing  . Palpitations    HPI:   Patient is a 59 y.o. female with past medical history significant for reflex sympathetic dystrophy of LLE and depression who presents today for TOC  Having palpitations, feels like very hard fast pounding, only at night, wake her up, 4-5 minutes Manages with biofeedback Started since she stopped taking nefazodone  Denies any chest pain, nausea, SOB, lightheadedness, edema Mild intermittent snoring Once palpitations resolve able to sleep wo issues  Having problems swallowing Feels food gets stuck bottom of throat Happens 2-3 x week Denies any pattern re textures, no issues with liquids or meds No uncontrolled reflux, uses omeprazole prn, maybe 2 x month Denies any hoarseness Remote smoker  Reports depression well controlled on 100mg  nortriptyline Reports chronic pain left ankle is well controlled of gabapentin, nortriptyline, baclofen and nucynta, managed by pain mgt,   Depression screen [facility] 2/9 12/18/2019 03/22/2018 12/24/2015  Decreased Interest 0 0 0  Down, Depressed, Hopeless 0 0 0  PHQ - 2 Score 0 0 0    Fall Risk  12/18/2019 03/22/2018 12/24/2015  Falls in the past year? 0 Yes No  Comment - Fell 1 month ago scrapped knee and elbow. tripped over feet. -  Number falls in past yr: 0 - -  Injury with Fall? 0 - -  Follow up Falls evaluation completed - -     Allergies  Allergen Reactions  . Shellfish Allergy Anaphylaxis, Hives, Itching, Shortness Of Breath and Swelling  . Fish-Derived Products   . Shellfish-Derived Products     Prior to Admission medications   Medication Sig Start Date End Date Taking? Authorizing Provider  baclofen (LIORESAL) 10 MG tablet Take 10 mg by mouth 2 (two) times daily.   Yes [provider]  Gabapentin, Once-Daily, (GRALISE) 600 MG TABS Take by  mouth.   Yes [provider]  nortriptyline (PAMELOR) 50 MG capsule Take 100 mg by mouth at bedtime.   Yes [provider]  omeprazole (PRILOSEC) 20 MG capsule Frequency:   Dosage:0.0     Instructions:  Note: 05/31/09  Yes [provider]  Tapentadol HCl (NUCYNTA ER) 150 MG TB12 take 1 tablet by mouth twice a day 11/25/15  Yes [provider]    Past Medical History:  Diagnosis Date  . Allergy   . Asthma   . Chronic pain   . Depression   . Gallstones   . GERD (gastroesophageal reflux disease)   . Neuromuscular disorder ([place])   . Urticaria     Past Surgical History:  Procedure Laterality Date  . APPENDECTOMY    . BREAST REDUCTION SURGERY    . BREAST SURGERY    . CHOLECYSTECTOMY    . NASAL SEPTUM SURGERY      Social History   Tobacco Use  . Smoking status: Former Smoker    Packs/day: 1.00    Years: 15.00    Pack years: 15.00    Types: Cigarettes  . Smokeless tobacco: Never Used  Substance Use Topics  . Alcohol use: No    Family History  Problem Relation Age of Onset  . Diabetes Father   . Stroke Father   . Allergic rhinitis Neg Hx   . Angioedema Neg Hx   . Asthma Neg Hx   . Eczema Neg Hx   . Immunodeficiency Neg Hx   .  Urticaria Neg Hx     ROS Per hpi  OBJECTIVE:  Today's Vitals   12/18/19 1414 12/18/19 1418  BP: (!) 136/85   Pulse: (!) 116 (!) 112  Temp: 98 F (36.7 C)   SpO2: 95%   Weight: (!) 286 lb (129.7 kg)   Height: 5\' 5"  (1.651 m)    Body mass index is 47.59 kg/m.   Physical Exam Vitals and nursing note reviewed.  Constitutional:      Appearance: She is well-developed.  HENT:     Head: Normocephalic and atraumatic.     Mouth/Throat:     Pharynx: No oropharyngeal exudate.  Eyes:     General: No scleral icterus.    Conjunctiva/sclera: Conjunctivae normal.     Pupils: Pupils are equal, round, and reactive to light.  Neck:     Thyroid: No thyroid mass, thyromegaly or thyroid tenderness.    Cardiovascular:     Rate and Rhythm: Regular rhythm. Tachycardia present.     Heart sounds: Normal heart sounds. No murmur heard.  No friction rub. No gallop.   Pulmonary:     Effort: Pulmonary effort is normal.     Breath sounds: Normal breath sounds. No wheezing, rhonchi or rales.  Abdominal:     General: Bowel sounds are normal. There is no distension.     Palpations: Abdomen is soft.     Tenderness: There is no abdominal tenderness.  Musculoskeletal:     Cervical back: Neck supple.     Right lower leg: No edema.     Left lower leg: No edema.  Lymphadenopathy:     Cervical: No cervical adenopathy.  Skin:    General: Skin is warm and dry.  Neurological:     Mental Status: She is alert and oriented to person, place, and time.    My interpretation of EKG:  Sinus, HR 111, poor R wave progression, no ST changes, normal intervals  No results found for this or any previous visit (from the past 24 hour(s)).  No results found.   ASSESSMENT and PLAN  1. Palpitations Patient with sinus tach, symptomatic only at bedtime since change in depression medication. Start low dose metoprolol XL. Add low dose buspar at bedtime for possible anxiety component. Consider cards/pulm referral. RTC precautions reviewed. - TSH - CBC - Comprehensive metabolic panel - EKG 32-IZTI - sinus tach  2. Dysphagia, unspecified type Discussed trial of daily omeprazole. Consider ent eval.   3. Complex regional pain syndrome type 1 of left lower extremity Stable. Managed by pain mgt  4. Depressive disorder Stable. Cont current mgt  5. Visit for screening mammogram - MM DIGITAL SCREENING BILATERAL; Future  Other orders - nortriptyline (PAMELOR) 50 MG capsule; Take 100 mg by mouth at bedtime. - busPIRone (BUSPAR) 5 MG tablet; Take 1 tablet (5 mg total) by mouth at bedtime. - omeprazole (PRILOSEC) 20 MG capsule; Take 1 capsule (20 mg total) by mouth daily. - metoprolol succinate (TOPROL-XL) 25 MG 24  hr tablet; Take 0.5 tablets (12.5 mg total) by mouth daily.  Return in about 4 weeks (around 01/15/2020).    Rutherford Guys, MD Primary Care at [place], [place] [address] Ph.  [phone] Fax [phone]

## 2019-12-18 NOTE — Patient Instructions (Signed)
° ° ° °  If you have lab work done today you will be contacted with your lab results within the next 2 weeks.  If you have not heard from us then please contact us. The fastest way to get your results is to register for My Chart. ° ° °IF you received an x-ray today, you will receive an invoice from [facility]. Please contact  Radiology at [phone] with questions or concerns regarding your invoice.  ° °IF you received labwork today, you will receive an invoice from [facility]. Please contact [facility] at [phone] with questions or concerns regarding your invoice.  ° °Our billing staff will not be able to assist you with questions regarding bills from these companies. ° °You will be contacted with the lab results as soon as they are available. The fastest way to get your results is to activate your My Chart account. Instructions are located on the last page of this paperwork. If you have not heard from us regarding the results in 2 weeks, please contact this office. °  ° ° ° °

## 2019-12-19 ENCOUNTER — Encounter: Payer: Self-pay | Admitting: Family Medicine

## 2019-12-19 LAB — CBC
Hematocrit: 46.5 % (ref 34.0–46.6)
Hemoglobin: 15.3 g/dL (ref 11.1–15.9)
MCH: 29.2 pg (ref 26.6–33.0)
MCHC: 32.9 g/dL (ref 31.5–35.7)
MCV: 89 fL (ref 79–97)
Platelets: 152 10*3/uL (ref 150–450)
RBC: 5.24 x10E6/uL (ref 3.77–5.28)
RDW: 14.1 % (ref 11.7–15.4)
WBC: 8.5 10*3/uL (ref 3.4–10.8)

## 2019-12-19 LAB — COMPREHENSIVE METABOLIC PANEL
ALT: 40 IU/L — ABNORMAL HIGH (ref 0–32)
AST: 58 IU/L — ABNORMAL HIGH (ref 0–40)
Albumin/Globulin Ratio: 1.4 (ref 1.2–2.2)
Albumin: 4.4 g/dL (ref 3.8–4.8)
Alkaline Phosphatase: 124 IU/L — ABNORMAL HIGH (ref 48–121)
BUN/Creatinine Ratio: 19 (ref 12–28)
BUN: 16 mg/dL (ref 8–27)
Bilirubin Total: 0.4 mg/dL (ref 0.0–1.2)
CO2: 22 mmol/L (ref 20–29)
Calcium: 9.2 mg/dL (ref 8.7–10.3)
Chloride: 104 mmol/L (ref 96–106)
Creatinine, Ser: 0.86 mg/dL (ref 0.57–1.00)
GFR calc Af Amer: 84 mL/min/{1.73_m2} (ref >59)
GFR calc non Af Amer: 73 mL/min/{1.73_m2} (ref >59)
Globulin, Total: 3.1 g/dL (ref 1.5–4.5)
Glucose: 98 mg/dL (ref 65–99)
Potassium: 4.1 mmol/L (ref 3.5–5.2)
Sodium: 141 mmol/L (ref 134–144)
Total Protein: 7.5 g/dL (ref 6.0–8.5)

## 2019-12-19 LAB — TSH: TSH: 1.68 u[IU]/mL (ref 0.450–4.500)

## 2020-01-15 ENCOUNTER — Ambulatory Visit (INDEPENDENT_AMBULATORY_CARE_PROVIDER_SITE_OTHER): Payer: Medicare Other | Admitting: Family Medicine

## 2020-01-15 ENCOUNTER — Other Ambulatory Visit: Payer: Self-pay

## 2020-01-15 ENCOUNTER — Encounter: Payer: Self-pay | Admitting: Family Medicine

## 2020-01-15 VITALS — BP 138/84 | HR 103 | Temp 97.7°F | Resp 18 | Ht 65.0 in | Wt 275.2 lb

## 2020-01-15 DIAGNOSIS — R002 Palpitations: Secondary | ICD-10-CM | POA: Diagnosis not present

## 2020-01-15 DIAGNOSIS — Z23 Encounter for immunization: Secondary | ICD-10-CM | POA: Diagnosis not present

## 2020-01-15 DIAGNOSIS — R1310 Dysphagia, unspecified: Secondary | ICD-10-CM | POA: Diagnosis not present

## 2020-01-15 MED ORDER — OMEPRAZOLE 20 MG PO CPDR: 20.0000 mg | DELAYED_RELEASE_CAPSULE | Freq: Two times a day (BID) | ORAL | 1 refills | Status: DC

## 2020-01-15 MED ORDER — BUSPIRONE HCL 10 MG PO TABS: 10.0000 mg | ORAL_TABLET | Freq: Every day | ORAL | 1 refills | Status: DC

## 2020-01-15 NOTE — Patient Instructions (Signed)
° ° ° °  If you have lab work done today you will be contacted with your lab results within the next 2 weeks.  If you have not heard from us then please contact us. The fastest way to get your results is to register for My Chart. ° ° °IF you received an x-ray today, you will receive an invoice from [facility]. Please contact [facility] at [phone] with questions or concerns regarding your invoice.  ° °IF you received labwork today, you will receive an invoice from [facility]. Please contact [facility] at [phone] with questions or concerns regarding your invoice.  ° °Our billing staff will not be able to assist you with questions regarding bills from these companies. ° °You will be contacted with the lab results as soon as they are available. The fastest way to get your results is to activate your My Chart account. Instructions are located on the last page of this paperwork. If you have not heard from us regarding the results in 2 weeks, please contact this office. °  ° ° ° °

## 2020-01-15 NOTE — Progress Notes (Signed)
8/23/202111:21 AM  Theresa Moran September 19, 1960, 59 y.o., female [id number]  Chief Complaint  Patient presents with  . Follow-up    Patient states she is here for 1 month follow up for palpitations and also having sime issues swallowing for a few months now that seems to be getting better with omeprazole.    HPI:   Patient is a 59 y.o. female with past medical history significant for reflex sympathetic dystrophy of LLE and depression  who presents today for routine followup  Last OV a month ago - trial of low dose metoprolol, buspar at bedtime, omeprazole daily  She reports that buspar at bedtime is helping with her palpitations, not daily anymore, less intense when they do, she has only 3 episodes since last OV  She is taking omeprazole daily now, once a day She feels that her dysphagia is much better  She does not check BP at home  She has started back on weight watchers Scheduled for mammo at breast center  Wt Readings from Last 3 Encounters:  01/15/20 275 lb 3.2 oz (124.8 kg)  12/18/19 (!) 286 lb (129.7 kg)  03/22/18 257 lb 12.8 oz (116.9 kg)   BP Readings from Last 3 Encounters:  01/15/20 138/84  12/18/19 (!) 136/85  03/22/18 131/85    Depression screen PHQ 2/9 12/18/2019 03/22/2018 12/24/2015  Decreased Interest 0 0 0  Down, Depressed, Hopeless 0 0 0  PHQ - 2 Score 0 0 0    Fall Risk  12/18/2019 03/22/2018 12/24/2015  Falls in the past year? 0 Yes No  Comment - Fell 1 month ago scrapped knee and elbow. tripped over feet. -  Number falls in past yr: 0 - -  Injury with Fall? 0 - -  Follow up Falls evaluation completed - -     Allergies  Allergen Reactions  . Shellfish Allergy Anaphylaxis, Hives, Itching, Shortness Of Breath and Swelling  . Fish-Derived Products   . Shellfish-Derived Products     Prior to Admission medications   Medication Sig Start Date End Date Taking? Authorizing Provider  baclofen (LIORESAL) 10 MG tablet Take 10 mg by mouth 2 (two) times  daily.   Yes [provider]  busPIRone (BUSPAR) 5 MG tablet Take 1 tablet (5 mg total) by mouth at bedtime. 12/18/19  Yes Rutherford Guys, MD  Gabapentin, Once-Daily, (GRALISE) 600 MG TABS Take by mouth.   Yes [provider]  metoprolol succinate (TOPROL-XL) 25 MG 24 hr tablet Take 0.5 tablets (12.5 mg total) by mouth daily. 12/18/19  Yes Rutherford Guys, MD  nortriptyline (PAMELOR) 50 MG capsule Take 100 mg by mouth at bedtime.   Yes [provider]  omeprazole (PRILOSEC) 20 MG capsule Take 1 capsule (20 mg total) by mouth daily. 12/18/19  Yes Rutherford Guys, MD  Tapentadol HCl (NUCYNTA ER) 150 MG TB12 take 1 tablet by mouth twice a day 11/25/15  Yes [provider]    Past Medical History:  Diagnosis Date  . Allergy   . Asthma   . Chronic pain   . Depression   . Gallstones   . GERD (gastroesophageal reflux disease)   . Neuromuscular disorder ([place])   . Urticaria     Past Surgical History:  Procedure Laterality Date  . APPENDECTOMY    . BREAST REDUCTION SURGERY    . BREAST SURGERY    . CHOLECYSTECTOMY    . NASAL SEPTUM SURGERY      Social History  Tobacco Use  . Smoking status: Former Smoker    Packs/day: 1.00    Years: 15.00    Pack years: 15.00    Types: Cigarettes  . Smokeless tobacco: Never Used  Substance Use Topics  . Alcohol use: No    Family History  Problem Relation Age of Onset  . Diabetes Father   . Stroke Father   . Allergic rhinitis Neg Hx   . Angioedema Neg Hx   . Asthma Neg Hx   . Eczema Neg Hx   . Immunodeficiency Neg Hx   . Urticaria Neg Hx     Review of Systems  Constitutional: Negative for chills and fever.  Respiratory: Negative for cough and shortness of breath.   Cardiovascular: Positive for palpitations. Negative for chest pain and leg swelling.  Gastrointestinal: Negative for abdominal pain, nausea and vomiting.  Neurological: Negative for dizziness and loss of consciousness.      OBJECTIVE:  Today's Vitals   01/15/20 1044 01/15/20 1050  BP: (!) 143/89 138/84  Pulse: (!) 114 (!) 103  Resp: 18   Temp: 97.7 F (36.5 C)   TempSrc: Temporal   SpO2: 100%   Weight: 275 lb 3.2 oz (124.8 kg)   Height: 5\' 5"  (1.651 m)    Body mass index is 45.8 kg/m.   Physical Exam Vitals and nursing note reviewed.  Constitutional:      Appearance: She is well-developed.  HENT:     Head: Normocephalic and atraumatic.     Mouth/Throat:     Pharynx: No oropharyngeal exudate.  Eyes:     General: No scleral icterus.    Extraocular Movements: Extraocular movements intact.     Conjunctiva/sclera: Conjunctivae normal.     Pupils: Pupils are equal, round, and reactive to light.  Cardiovascular:     Rate and Rhythm: Normal rate and regular rhythm.     Heart sounds: Normal heart sounds. No murmur heard.  No friction rub. No gallop.   Pulmonary:     Effort: Pulmonary effort is normal.     Breath sounds: Normal breath sounds. No wheezing, rhonchi or rales.  Musculoskeletal:     Cervical back: Neck supple.  Skin:    General: Skin is warm and dry.  Neurological:     Mental Status: She is alert and oriented to person, place, and time.     No results found for this or any previous visit (from the past 24 hour(s)).  No results found.   ASSESSMENT and PLAN  1. Palpitations Improving 2/2 anxiety. Increase buspar to 10mg  bedtime.  2. Dysphagia, unspecified type Improving. Increased omeprazole to BID  Other orders - Flu Vaccine QUAD 36+ mos IM - busPIRone (BUSPAR) 10 MG tablet; Take 1 tablet (10 mg total) by mouth at bedtime. - omeprazole (PRILOSEC) 20 MG capsule; Take 1 capsule (20 mg total) by mouth 2 (two) times daily before a meal.  Return in about 4 weeks (around 02/12/2020).    Rutherford Guys, MD Primary Care at [place], [place] [address] Ph.  [phone] Fax [phone]

## 2020-02-06 ENCOUNTER — Other Ambulatory Visit: Payer: Self-pay

## 2020-02-06 ENCOUNTER — Ambulatory Visit
Admission: RE | Admit: 2020-02-06 | Discharge: 2020-02-06 | Disposition: A | Discharge status: Home / Self Care | Payer: Medicare Other | Source: Ambulatory Visit | Attending: Family Medicine | Admitting: Family Medicine

## 2020-02-06 DIAGNOSIS — Z1231 Encounter for screening mammogram for malignant neoplasm of breast: Secondary | ICD-10-CM

## 2020-02-12 ENCOUNTER — Ambulatory Visit (INDEPENDENT_AMBULATORY_CARE_PROVIDER_SITE_OTHER): Payer: Medicare Other | Admitting: Family Medicine

## 2020-02-12 ENCOUNTER — Other Ambulatory Visit: Payer: Self-pay | Admitting: Family Medicine

## 2020-02-12 ENCOUNTER — Encounter: Payer: Self-pay | Admitting: Family Medicine

## 2020-02-12 ENCOUNTER — Other Ambulatory Visit: Payer: Self-pay

## 2020-02-12 VITALS — BP 134/86 | HR 100 | Temp 97.9°F | Ht 65.0 in | Wt 266.4 lb

## 2020-02-12 DIAGNOSIS — R1310 Dysphagia, unspecified: Secondary | ICD-10-CM

## 2020-02-12 DIAGNOSIS — Z91018 Allergy to other foods: Secondary | ICD-10-CM

## 2020-02-12 DIAGNOSIS — R002 Palpitations: Secondary | ICD-10-CM | POA: Diagnosis not present

## 2020-02-12 HISTORY — DX: Allergy to other foods: Z91.018

## 2020-02-12 MED ORDER — BUSPIRONE HCL 10 MG PO TABS: 10.0000 mg | ORAL_TABLET | Freq: Two times a day (BID) | ORAL | 0 refills | Status: DC

## 2020-02-12 MED ORDER — EPINEPHRINE 0.3 MG/0.3ML IJ SOAJ: 0.3000 mg | Freq: Once | INTRAMUSCULAR | 0 refills | Status: AC

## 2020-02-12 NOTE — Patient Instructions (Signed)
° ° ° °  If you have lab work done today you will be contacted with your lab results within the next 2 weeks.  If you have not heard from us then please contact us. The fastest way to get your results is to register for My Chart. ° ° °IF you received an x-ray today, you will receive an invoice from [facility]. Please contact [facility] at [phone] with questions or concerns regarding your invoice.  ° °IF you received labwork today, you will receive an invoice from [facility]. Please contact [facility] at [phone] with questions or concerns regarding your invoice.  ° °Our billing staff will not be able to assist you with questions regarding bills from these companies. ° °You will be contacted with the lab results as soon as they are available. The fastest way to get your results is to activate your My Chart account. Instructions are located on the last page of this paperwork. If you have not heard from us regarding the results in 2 weeks, please contact this office. °  ° ° ° °

## 2020-02-12 NOTE — Telephone Encounter (Signed)
Approved per protocol.  Requested Prescriptions  Pending Prescriptions Disp Refills  . metoprolol succinate (TOPROL-XL) 25 MG 24 hr tablet [Pharmacy Med Name: METOPROLOL ER SUCCINATE 25MG  TABS] 90 tablet 0    Sig: TAKE 1/2 TABLET(12.5 MG) BY MOUTH DAILY     Cardiovascular:  Beta Blockers Passed - 02/12/2020  3:55 PM      Passed - Last BP in normal range    BP Readings from Last 1 Encounters:  02/12/20 134/86         Passed - Last Heart Rate in normal range    Pulse Readings from Last 1 Encounters:  02/12/20 100         Passed - Valid encounter within last 6 months    Recent Outpatient Visits          Today Palpitations   [facility], Lilia Argue, MD   4 weeks ago Palpitations   [facility], Lilia Argue, MD   1 month ago Palpitations   [facility], Lilia Argue, MD   1 year ago Encounter for health maintenance examination in adult   [facility] at [facility], Arlie Solomons, MD   4 years ago Chronic idiopathic urticaria   [facility] at [place], PA-C

## 2020-02-12 NOTE — Progress Notes (Signed)
9/20/202110:34 AM  Theresa Moran [birth year], 59 y.o., female [id number]  Chief Complaint  Patient presents with  . Palpitations    does not feel the palpitations as much as before, buspar medication was increased to bid  . Medication Refill    epi pen    HPI:   Patient is a 59 y.o. 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 0 Yes No  Comment - Fell 1 month ago scrapped knee and elbow. tripped over feet. -  Number falls in past yr: 0 - -  Injury with Fall? 0 - -  Follow up Falls evaluation completed - -     Allergies  Allergen Reactions  . Shellfish Allergy Anaphylaxis, Hives, Itching, Shortness Of Breath and Swelling  . Fish-Derived Products   . Shellfish-Derived Products     Prior to Admission medications   Medication Sig Start Date End Date Taking? Authorizing Provider  baclofen (LIORESAL) 10 MG tablet Take 10 mg by mouth 2 (two) times daily.   Yes [provider]  busPIRone (BUSPAR) 10 MG tablet Take 1 tablet (10 mg total) by mouth at bedtime. Patient taking differently: Take 10 mg by mouth 2 (two) times daily.  01/15/20  Yes Rutherford Guys, MD  Gabapentin, Once-Daily, (GRALISE) 600 MG TABS Take by mouth in the morning and at  bedtime.    Yes [provider]  metoprolol succinate (TOPROL-XL) 25 MG 24 hr tablet Take 0.5 tablets (12.5 mg total) by mouth daily. 12/18/19  Yes Rutherford Guys, MD  nortriptyline (PAMELOR) 50 MG capsule Take 100 mg by mouth at bedtime.   Yes [provider]  omeprazole (PRILOSEC) 20 MG capsule Take 1 capsule (20 mg total) by mouth 2 (two) times daily before a meal. 01/15/20  Yes Rutherford Guys, MD    Past Medical History:  Diagnosis Date  . Allergy   . Asthma   . Chronic pain   . Depression   . Gallstones   . GERD (gastroesophageal reflux disease)   . Neuromuscular disorder ([place])   . Urticaria     Past Surgical History:  Procedure Laterality Date  . APPENDECTOMY    . BREAST REDUCTION SURGERY    . BREAST SURGERY    . CHOLECYSTECTOMY    . NASAL SEPTUM SURGERY      Social History   Tobacco Use  . Smoking status: Former Smoker    Packs/day: 1.00    Years: 15.00    Pack years: 15.00    Types: Cigarettes  . Smokeless tobacco: Never Used  Substance Use Topics  . Alcohol use: No    Family History  Problem Relation Age of Onset  . Diabetes Father   .  Stroke Father   . Allergic rhinitis Neg Hx   . Angioedema Neg Hx   . Asthma Neg Hx   . Eczema Neg Hx   . Immunodeficiency Neg Hx   . Urticaria Neg Hx     ROS Per hpi  OBJECTIVE:  Today's Vitals   02/12/20 1021  BP: 134/86  Pulse: 100  Temp: 97.9 F (36.6 C)  SpO2: 96%  Weight: 266 lb 6.4 oz (120.8 kg)  Height: 5\' 5"  (1.651 m)   Body mass index is 44.33 kg/m.   Physical Exam Vitals and nursing note reviewed.  Constitutional:      Appearance: She is well-developed.  HENT:     Head: Normocephalic and atraumatic.  Eyes:     General: No scleral icterus.    Conjunctiva/sclera: Conjunctivae normal.     Pupils: Pupils are equal, round, and reactive to light.  Pulmonary:     Effort: Pulmonary effort is normal.  Musculoskeletal:     Cervical back: Neck supple.  Skin:    General:  Skin is warm and dry.  Neurological:     Mental Status: She is alert and oriented to person, place, and time.     No results found for this or any previous visit (from the past 24 hour(s)).  No results found.   ASSESSMENT and PLAN  1. Palpitations 2/2 anxiety, resolved on buspar. Now managed by psych  2. Dysphagia, unspecified type Sign improved on PPI, cont gerd dietary changes, consider GI referral if worsens  3. H/O food anaphylaxis rx for epi pen given  Other orders - EPINEPHrine (EPIPEN 2-PAK) 0.3 mg/0.3 mL IJ SOAJ injection; Inject 0.3 mg into the muscle once for 1 dose. - busPIRone (BUSPAR) 10 MG tablet; Take 1 tablet (10 mg total) by mouth 2 (two) times daily.  Return in about 6 months (around 08/11/2020).    Rutherford Guys, MD Primary Care at [place], [place] [address] Ph.  [phone] Fax [phone]

## 2020-12-03 ENCOUNTER — Encounter (HOSPITAL_COMMUNITY): Payer: Self-pay

## 2020-12-03 ENCOUNTER — Ambulatory Visit (HOSPITAL_COMMUNITY)
Admission: EM | Admit: 2020-12-03 | Discharge: 2020-12-03 | Disposition: A | Discharge status: Home / Self Care | Payer: Medicare Other

## 2020-12-03 ENCOUNTER — Emergency Department (HOSPITAL_COMMUNITY): Payer: Medicare Other

## 2020-12-03 ENCOUNTER — Emergency Department (HOSPITAL_COMMUNITY)
Admission: EM | Admit: 2020-12-03 | Discharge: 2020-12-03 | Disposition: A | Discharge status: Home / Self Care | Payer: Medicare Other | Attending: Emergency Medicine | Admitting: Emergency Medicine

## 2020-12-03 ENCOUNTER — Encounter (HOSPITAL_COMMUNITY): Payer: Self-pay | Admitting: Emergency Medicine

## 2020-12-03 ENCOUNTER — Other Ambulatory Visit: Payer: Self-pay

## 2020-12-03 DIAGNOSIS — J988 Other specified respiratory disorders: Secondary | ICD-10-CM | POA: Diagnosis not present

## 2020-12-03 DIAGNOSIS — Z20822 Contact with and (suspected) exposure to covid-19: Secondary | ICD-10-CM | POA: Diagnosis not present

## 2020-12-03 DIAGNOSIS — R0602 Shortness of breath: Secondary | ICD-10-CM

## 2020-12-03 DIAGNOSIS — Z87891 Personal history of nicotine dependence: Secondary | ICD-10-CM | POA: Diagnosis not present

## 2020-12-03 DIAGNOSIS — R0789 Other chest pain: Secondary | ICD-10-CM

## 2020-12-03 DIAGNOSIS — R7309 Other abnormal glucose: Secondary | ICD-10-CM | POA: Insufficient documentation

## 2020-12-03 DIAGNOSIS — J45909 Unspecified asthma, uncomplicated: Secondary | ICD-10-CM | POA: Diagnosis not present

## 2020-12-03 DIAGNOSIS — R000 Tachycardia, unspecified: Secondary | ICD-10-CM

## 2020-12-03 DIAGNOSIS — H539 Unspecified visual disturbance: Secondary | ICD-10-CM

## 2020-12-03 LAB — COMPREHENSIVE METABOLIC PANEL
ALT: 35 U/L (ref 0–44)
AST: 62 U/L — ABNORMAL HIGH (ref 15–41)
Albumin: 3.5 g/dL (ref 3.5–5.0)
Alkaline Phosphatase: 122 U/L (ref 38–126)
Anion gap: 3 — ABNORMAL LOW (ref 5–15)
BUN: 9 mg/dL (ref 8–23)
CO2: 29 mmol/L (ref 22–32)
Calcium: 8.9 mg/dL (ref 8.9–10.3)
Chloride: 106 mmol/L (ref 98–111)
Creatinine, Ser: 0.8 mg/dL (ref 0.44–1.00)
GFR, Estimated: >60 mL/min (ref >60)
Glucose, Bld: 115 mg/dL — ABNORMAL HIGH (ref 70–99)
Potassium: 3.9 mmol/L (ref 3.5–5.1)
Sodium: 138 mmol/L (ref 135–145)
Total Bilirubin: 1.2 mg/dL (ref 0.3–1.2)
Total Protein: 7.8 g/dL (ref 6.5–8.1)

## 2020-12-03 LAB — CBC WITH DIFFERENTIAL/PLATELET
Abs Immature Granulocytes: 0.02 10*3/uL (ref 0.00–0.07)
Basophils Absolute: 0.1 10*3/uL (ref 0.0–0.1)
Basophils Relative: 1 %
Eosinophils Absolute: 0.3 10*3/uL (ref 0.0–0.5)
Eosinophils Relative: 4 %
HCT: 44.6 % (ref 36.0–46.0)
Hemoglobin: 14.4 g/dL (ref 12.0–15.0)
Immature Granulocytes: 0 %
Lymphocytes Relative: 22 %
Lymphs Abs: 1.4 10*3/uL (ref 0.7–4.0)
MCH: 31 pg (ref 26.0–34.0)
MCHC: 32.3 g/dL (ref 30.0–36.0)
MCV: 95.9 fL (ref 80.0–100.0)
Monocytes Absolute: 0.5 10*3/uL (ref 0.1–1.0)
Monocytes Relative: 7 %
Neutro Abs: 4 10*3/uL (ref 1.7–7.7)
Neutrophils Relative %: 66 %
Platelets: 119 10*3/uL — ABNORMAL LOW (ref 150–400)
RBC: 4.65 MIL/uL (ref 3.87–5.11)
RDW: 14.6 % (ref 11.5–15.5)
WBC: 6.2 10*3/uL (ref 4.0–10.5)
nRBC: 0 % (ref 0.0–0.2)

## 2020-12-03 LAB — RESP PANEL BY RT-PCR (FLU A&B, COVID) ARPGX2
Influenza A by PCR: NEGATIVE
Influenza B by PCR: NEGATIVE
SARS Coronavirus 2 by RT PCR: NEGATIVE

## 2020-12-03 LAB — TROPONIN I (HIGH SENSITIVITY)
Troponin I (High Sensitivity): 6 ng/L (ref <18)
Troponin I (High Sensitivity): 5 ng/L (ref <18)

## 2020-12-03 LAB — CBG MONITORING, ED: Glucose-Capillary: 110 mg/dL — ABNORMAL HIGH (ref 70–99)

## 2020-12-03 MED ORDER — ALBUTEROL SULFATE HFA 108 (90 BASE) MCG/ACT IN AERS
2.0000 | INHALATION_SPRAY | Freq: Once | RESPIRATORY_TRACT | Status: AC
  Administered 2020-12-03: 2 via RESPIRATORY_TRACT
  Filled 2020-12-03: qty 6.7

## 2020-12-03 MED ORDER — AEROCHAMBER PLUS FLO-VU MISC
1.0000 | Freq: Once | Status: AC
  Administered 2020-12-03: 1
  Filled 2020-12-03: qty 1

## 2020-12-03 MED ORDER — IPRATROPIUM-ALBUTEROL 0.5-2.5 (3) MG/3ML IN SOLN
3.0000 mL | RESPIRATORY_TRACT | Status: AC
  Administered 2020-12-03: 3 mL via RESPIRATORY_TRACT
  Filled 2020-12-03: qty 3

## 2020-12-03 MED ORDER — PREDNISONE 20 MG PO TABS: ORAL_TABLET | ORAL | 0 refills | Status: DC

## 2020-12-03 MED ORDER — PREDNISONE 20 MG PO TABS
60.0000 mg | ORAL_TABLET | Freq: Once | ORAL | Status: AC
  Administered 2020-12-03: 60 mg via ORAL
  Filled 2020-12-03: qty 3

## 2020-12-03 NOTE — ED Provider Notes (Signed)
Emergency Medicine Provider Triage Evaluation Note  Theresa Moran , a 60 y.o. female  was evaluated in triage.  Pt complains of patient presents with chest tightness, shortness of breath, and a nonproductive cough.  \Cough started yesterday and she started to have the chest tightness earlier today, she has no cardiac history, no history of PEs or DVTs, denies any short leg swelling.  Denies recent COVID and exposures, fully vaccinated..  Review of Systems  Positive: Shortness of breath, cough Negative: Abdominal pain, nausea vomiting  Physical Exam  BP 136/78 (BP Location: Right Arm)   Pulse (!) 114   Temp 98.3 F (36.8 C) (Oral)   Resp 14   Ht 5\' 6"  (1.676 m)   Wt 124.7 kg   SpO2 97%   BMI 44.39 kg/m  Gen:   Awake, no distress   Resp:  Normal effort  MSK:   Moves extremities without difficulty  Other:    Medical Decision Making  Medically screening exam initiated at 4:03 PM.  Appropriate orders placed.  Theresa Moran was informed that the remainder of the evaluation will be completed by another provider, this initial triage assessment does not replace that evaluation, and the importance of remaining in the ED until their evaluation is complete.  Patient presents with chest tightness shortness of breath, laboratories been ordered, patient need further work-up in the emergency department.   Marcello Fennel, PA-C 12/03/20 1604    Carmin Muskrat, MD 12/05/20 [phone]

## 2020-12-03 NOTE — ED Triage Notes (Signed)
Pt reports chest tightness/wheezing/cough since yesterday with fatigue/trouble staying awake. Pt reports visual abnormalities, stating vision has abnormal shadows about an hour and a half ago in lobby.

## 2020-12-03 NOTE — ED Notes (Signed)
Patient is being discharged from the Urgent Care and sent to the Emergency Department via [facility] with daughter. Per Merrie Roof PA, patient is in need of higher level of care due to chest tightness, visual abnormalities (shadows in vision). Patient is aware and verbalizes understanding of plan of care.  Report called to Roselyn Reef, ED Charge RN. Vitals:   12/03/20 1409  BP: 131/75  Pulse: (!) 116  Resp: 20  Temp: 98.1 F (36.7 C)  SpO2: 96%

## 2020-12-03 NOTE — ED Provider Notes (Signed)
[place]    CSN: [id number] Arrival date & time: 12/03/20  1149      History   Chief Complaint Chief Complaint  Patient presents with   Chest Pain   Wheezing   Cough   Fatigue   visual abnormality    HPI Theresa Moran is a 60 y.o. female.   Patient presenting today with sudden onset dyspnea on exertion, wheezing, chest tightness, cough, drowsiness that started yesterday evening.  She denies fever, chills, congestion, sore throat, abdominal pain, recent sick contacts, recent illness.  States while she was in the waiting room today reading her book she started to lose her vision. She describes this as shadow vision which has never happened to her before.  Her vision has still not returned back to normal.  She denies any past history of neurologic issue, ophthalmologic issue, cardiac issues aside from tachycardia for which she is on metoprolol which she is compliant with.  Former smoker, no pulmonary diagnoses.   Past Medical History:  Diagnosis Date   Allergy    Asthma    Chronic pain    Depression    Gallstones    GERD (gastroesophageal reflux disease)    Neuromuscular disorder ([place])    Urticaria     Patient Active Problem List   Diagnosis Date Noted   H/O food anaphylaxis 02/12/2020   Chronic idiopathic urticaria 03/13/2016   Adverse food reaction 03/13/2016   Allergic rhinitis 03/13/2016   Chronic pain 03/08/2014   Reflex sympathetic dystrophy of lower extremity 09/13/2013   Depressive disorder 12/23/2012    Past Surgical History:  Procedure Laterality Date   APPENDECTOMY     BREAST REDUCTION SURGERY     BREAST SURGERY     CHOLECYSTECTOMY     NASAL SEPTUM SURGERY      OB History   No obstetric history on file.      Home Medications    Prior to Admission medications   Medication Sig Start Date End Date Taking? Authorizing Provider  baclofen (LIORESAL) 10 MG tablet Take 10 mg by mouth 2 (two) times daily.   Yes [provider]   busPIRone (BUSPAR) 10 MG tablet Take 1 tablet (10 mg total) by mouth 2 (two) times daily. 02/12/20  Yes Jacelyn Pi, Lilia Argue, MD  Gabapentin, Once-Daily, 600 MG TABS Take by mouth in the morning and at bedtime.    Yes [provider]  nortriptyline (PAMELOR) 50 MG capsule Take 100 mg by mouth at bedtime.   Yes [provider]  UNABLE TO FIND Med Name: reports taking Nucynta for pain   Yes [provider]  metoprolol succinate (TOPROL-XL) 25 MG 24 hr tablet TAKE 1/2 TABLET(12.5 MG) BY MOUTH DAILY 02/12/20   Jacelyn Pi, Lilia Argue, MD  omeprazole (PRILOSEC) 20 MG capsule Take 1 capsule (20 mg total) by mouth 2 (two) times daily before a meal. 01/15/20   Jacelyn Pi, Lilia Argue, MD    Family History Family History  Problem Relation Age of Onset   Diabetes Father    Stroke Father    Allergic rhinitis Neg Hx    Angioedema Neg Hx    Asthma Neg Hx    Eczema Neg Hx    Immunodeficiency Neg Hx    Urticaria Neg Hx     Social History Social History   Tobacco Use   Smoking status: Former    Packs/day: 1.00    Years: 15.00    Pack years: 15.00  Types: Cigarettes   Smokeless tobacco: Never  Substance Use Topics   Alcohol use: No   Drug use: No     Allergies   Shellfish allergy, Fish-derived products, and Shellfish-derived products   Review of Systems Review of Systems Per HPI  Physical Exam Triage Vital Signs ED Triage Vitals [12/03/20 1406]  Enc Vitals Group     BP      Pulse      Resp      Temp      Temp src      SpO2      Weight      Height      Head Circumference      Peak Flow      Pain Score 0     Pain Loc      Pain Edu?      Excl. in [place]?    No data found.  Updated Vital Signs BP 131/75   Pulse (!) 116   Temp 98.1 F (36.7 C)   Resp 20   SpO2 96%   Visual Acuity Right Eye Distance:   Left Eye Distance:   Bilateral Distance:    Right Eye Near:   Left Eye Near:    Bilateral Near:     Physical Exam Vitals and nursing  note reviewed.  Constitutional:      Appearance: Normal appearance. She is not ill-appearing.  HENT:     Head: Atraumatic.     Nose: Nose normal.     Mouth/Throat:     Mouth: Mucous membranes are moist.  Eyes:     Extraocular Movements: Extraocular movements intact.     Conjunctiva/sclera: Conjunctivae normal.     Pupils: Pupils are equal, round, and reactive to light.  Cardiovascular:     Rate and Rhythm: Regular rhythm. Tachycardia present.  Pulmonary:     Comments: Tachypneic after ambulation, mildly increased respiratory effort Musculoskeletal:        General: Normal range of motion.     Cervical back: Normal range of motion and neck supple.  Skin:    General: Skin is warm and dry.  Neurological:     Mental Status: She is alert and oriented to person, place, and time.  Psychiatric:        Mood and Affect: Mood normal.        Thought Content: Thought content normal.        Judgment: Judgment normal.     UC Treatments / Results  Labs (all labs ordered are listed, but only abnormal results are displayed) Labs Reviewed - No data to display  EKG   Radiology No results found.  Procedures Procedures (including critical care time)  Medications Ordered in UC Medications - No data to display  Initial Impression / Assessment and Plan / UC Course  I have reviewed the triage vital signs and the nursing notes.  Pertinent labs & imaging results that were available during my care of the patient were reviewed by me and considered in my medical decision making (see chart for details).     Tachycardic in triage but otherwise vital signs fairly benign.  EKG today sinus tachycardia but no obvious ST or T wave changes.  Given her sudden onset chest tightness, shortness of breath, and now concerning vision changes recommended she go to the emergency department for further evaluation and management of her symptoms.  She is agreeable to this and will have her daughter who was in the  waiting room drive  her via private vehicle.  She is currently hemodynamically stable for transport.  Final Clinical Impressions(s) / UC Diagnoses   Final diagnoses:  SOB (shortness of breath)  Tachycardia  Vision changes   Discharge Instructions   None    ED Prescriptions   None    PDMP not reviewed this encounter.   Volney American, [place] 12/03/20 1451

## 2020-12-03 NOTE — Discharge Instructions (Addendum)
Use your inhaler every 4 hours(6 puffs) while awake, return for sudden worsening shortness of breath, or if you need to use your inhaler more often.  ° °

## 2020-12-03 NOTE — ED Triage Notes (Signed)
Pt reports chest tightness and SOB starting yesterday. Endorses blurred vision and extreme thirst. Moving all extremities, no facial droop, equal grip strengths.

## 2020-12-17 NOTE — ED Provider Notes (Signed)
[facility] EMERGENCY DEPARTMENT Provider Note   CSN: [id number] Arrival date & time: 12/03/20  1437     History Chief Complaint  Patient presents with   Shortness of Breath    Theresa Moran is a 60 y.o. female.  60  yo F with a cc of sob.  Going on for the past couple days.  Went to urgent care and was sent here for eval. Feels like her prior asthma.  Cough but no fevers, no known sick contacts.   The history is provided by the patient.  Shortness of Breath Associated symptoms: cough   Associated symptoms: no chest pain, no fever, no headaches, no vomiting and no wheezing   Illness Severity:  Moderate Onset quality:  Gradual Duration:  2 days Timing:  Constant Progression:  Worsening Chronicity:  Recurrent Associated symptoms: cough and shortness of breath   Associated symptoms: no chest pain, no congestion, no fever, no headaches, no myalgias, no nausea, no rhinorrhea, no vomiting and no wheezing       Past Medical History:  Diagnosis Date   Allergy    Asthma    Chronic pain    Depression    Gallstones    GERD (gastroesophageal reflux disease)    Neuromuscular disorder ([facility])    Urticaria     Patient Active Problem List   Diagnosis Date Noted   H/O food anaphylaxis 02/12/2020   Chronic idiopathic urticaria 03/13/2016   Adverse food reaction 03/13/2016   Allergic rhinitis 03/13/2016   Chronic pain 03/08/2014   Reflex sympathetic dystrophy of lower extremity 09/13/2013   Depressive disorder 12/23/2012    Past Surgical History:  Procedure Laterality Date   APPENDECTOMY     BREAST REDUCTION SURGERY     BREAST SURGERY     CHOLECYSTECTOMY     NASAL SEPTUM SURGERY       OB History   No obstetric history on file.     Family History  Problem Relation Age of Onset   Diabetes Father    Stroke Father    Allergic rhinitis Neg Hx    Angioedema Neg Hx    Asthma Neg Hx    Eczema Neg Hx    Immunodeficiency Neg Hx    Urticaria Neg Hx      Social History   Tobacco Use   Smoking status: Former    Packs/day: 1.00    Years: 15.00    Pack years: 15.00    Types: Cigarettes   Smokeless tobacco: Never  Substance Use Topics   Alcohol use: No   Drug use: No    Home Medications Prior to Admission medications   Medication Sig Start Date End Date Taking? Authorizing Provider  predniSONE (DELTASONE) 20 MG tablet 2 tabs po daily x 4 days 12/03/20  Yes Deno Etienne, DO  baclofen (LIORESAL) 10 MG tablet Take 10 mg by mouth 2 (two) times daily.    [provider]  busPIRone (BUSPAR) 10 MG tablet Take 1 tablet (10 mg total) by mouth 2 (two) times daily. 02/12/20   Daleen Squibb, MD  Gabapentin, Once-Daily, 600 MG TABS Take by mouth in the morning and at bedtime.     [provider]  metoprolol succinate (TOPROL-XL) 25 MG 24 hr tablet TAKE 1/2 TABLET(12.5 MG) BY MOUTH DAILY 02/12/20   Jacelyn Pi, Lilia Argue, MD  nortriptyline (PAMELOR) 50 MG capsule Take 100 mg by mouth at bedtime.    [provider]  omeprazole ([place])  20 MG capsule Take 1 capsule (20 mg total) by mouth 2 (two) times daily before a meal. 01/15/20   Jacelyn Pi, Lilia Argue, MD  UNABLE TO FIND Med Name: reports taking Nucynta for pain    [provider]    Allergies    Shellfish allergy, Fish-derived products, and Shellfish-derived products  Review of Systems   Review of Systems  Constitutional:  Negative for chills and fever.  HENT:  Negative for congestion and rhinorrhea.   Eyes:  Negative for redness and visual disturbance.  Respiratory:  Positive for cough, chest tightness and shortness of breath. Negative for wheezing.   Cardiovascular:  Negative for chest pain and palpitations.  Gastrointestinal:  Negative for nausea and vomiting.  Genitourinary:  Negative for dysuria and urgency.  Musculoskeletal:  Negative for arthralgias and myalgias.  Skin:  Negative for pallor and wound.  Neurological:  Negative for dizziness  and headaches.   Physical Exam Updated Vital Signs BP (!) 183/90   Pulse (!) 114   Temp 99 F (37.2 C)   Resp 16   Ht '5\' 6"'$  (1.676 m)   Wt 124.7 kg   SpO2 100%   BMI 44.39 kg/m   Physical Exam Vitals and nursing note reviewed.  Constitutional:      General: She is not in acute distress.    Appearance: She is well-developed. She is not diaphoretic.  HENT:     Head: Normocephalic and atraumatic.  Eyes:     Pupils: Pupils are equal, round, and reactive to light.  Cardiovascular:     Rate and Rhythm: Normal rate and regular rhythm.     Heart sounds: No murmur heard.   No friction rub. No gallop.  Pulmonary:     Effort: Pulmonary effort is normal.     Breath sounds: Wheezing (diffuse wheezes with prolonged expiratory effort) present. No rales.  Abdominal:     General: There is no distension.     Palpations: Abdomen is soft.     Tenderness: There is no abdominal tenderness.  Musculoskeletal:        General: No tenderness.     Cervical back: Normal range of motion and neck supple.  Skin:    General: Skin is warm and dry.  Neurological:     Mental Status: She is alert and oriented to person, place, and time.  Psychiatric:        Behavior: Behavior normal.    ED Results / Procedures / Treatments   Labs (all labs ordered are listed, but only abnormal results are displayed) Labs Reviewed  COMPREHENSIVE METABOLIC PANEL - Abnormal; Notable for the following components:      Result Value   Glucose, Bld 115 (*)    AST 62 (*)    Anion gap 3 (*)    All other components within normal limits  CBC WITH DIFFERENTIAL/PLATELET - Abnormal; Notable for the following components:   Platelets 119 (*)    All other components within normal limits  CBG MONITORING, ED - Abnormal; Notable for the following components:   Glucose-Capillary 110 (*)    All other components within normal limits  RESP PANEL BY RT-PCR (FLU A&B, COVID) [id number]  TROPONIN I (HIGH SENSITIVITY)  TROPONIN I (HIGH  SENSITIVITY)    EKG EKG Interpretation  Date/Time:  Tuesday December 03 2020 15:52:15 EDT Ventricular Rate:  119 PR Interval:  168 QRS Duration: 78 QT Interval:  322 QTC Calculation: 452 R Axis:   48 Text Interpretation: Sinus tachycardia Low  voltage QRS Borderline ECG No significant change since last tracing Confirmed by Deno Etienne [id number]) on 12/03/2020 8:51:03 PM  Radiology No results found.  Procedures Procedures   Medications Ordered in ED Medications  ipratropium-albuterol (DUONEB) 0.5-2.5 (3) MG/3ML nebulizer solution 3 mL (3 mLs Nebulization Given 12/03/20 2131)  predniSONE (DELTASONE) tablet 60 mg (60 mg Oral Given 12/03/20 2242)  albuterol (VENTOLIN HFA) 108 (90 Base) MCG/ACT inhaler 2 puff (2 puffs Inhalation Given 12/03/20 2242)  aerochamber plus with mask device 1 each (1 each Other Given 12/03/20 2242)    ED Course  I have reviewed the triage vital signs and the nursing notes.  Pertinent labs & imaging results that were available during my care of the patient were reviewed by me and considered in my medical decision making (see chart for details).    MDM Rules/Calculators/A&P                           60 yo F with a cc of sob.  Going on for the past couple days.  Wheezing on exam, improved with breathing treatments, requesting d/c home.   11:02 PM:  I have discussed the diagnosis/risks/treatment options with the patient and believe the pt to be eligible for discharge home to follow-up with PCP. We also discussed returning to the ED immediately if new or worsening sx occur. We discussed the sx which are most concerning (e.g., sudden worsening pain, fever, inability to tolerate by mouth) that necessitate immediate return. Medications administered to the patient during their visit and any new prescriptions provided to the patient are listed below.  Medications given during this visit Medications  ipratropium-albuterol (DUONEB) 0.5-2.5 (3) MG/3ML nebulizer solution 3 mL  (3 mLs Nebulization Given 12/03/20 2131)  predniSONE (DELTASONE) tablet 60 mg (60 mg Oral Given 12/03/20 2242)  albuterol (VENTOLIN HFA) 108 (90 Base) MCG/ACT inhaler 2 puff (2 puffs Inhalation Given 12/03/20 2242)  aerochamber plus with mask device 1 each (1 each Other Given 12/03/20 2242)     The patient appears reasonably screen and/or stabilized for discharge and I doubt any other medical condition or other [facility] requiring further screening, evaluation, or treatment in the ED at this time prior to discharge.   Final Clinical Impression(s) / ED Diagnoses Final diagnoses:  Wheezing-associated respiratory infection (WARI)    Rx / DC Orders ED Discharge Orders          Ordered    predniSONE (DELTASONE) 20 MG tablet        12/03/20 2222             Deno Etienne, DO 12/17/20 2302

## 2021-02-03 ENCOUNTER — Encounter: Payer: Self-pay | Admitting: Gastroenterology

## 2021-03-08 DIAGNOSIS — U071 COVID-19: Secondary | ICD-10-CM

## 2021-03-08 DIAGNOSIS — Z8616 Personal history of COVID-19: Secondary | ICD-10-CM

## 2021-03-08 HISTORY — DX: COVID-19: U07.1

## 2021-03-08 HISTORY — DX: Personal history of COVID-19: Z86.16

## 2021-03-10 ENCOUNTER — Encounter (HOSPITAL_COMMUNITY): Payer: Self-pay

## 2021-03-10 ENCOUNTER — Other Ambulatory Visit: Payer: Self-pay

## 2021-03-10 ENCOUNTER — Emergency Department (HOSPITAL_COMMUNITY)
Admission: EM | Admit: 2021-03-10 | Discharge: 2021-03-11 | Disposition: A | Discharge status: Home / Self Care | Payer: Medicare Other | Attending: Emergency Medicine | Admitting: Emergency Medicine

## 2021-03-10 ENCOUNTER — Emergency Department (HOSPITAL_COMMUNITY): Payer: Medicare Other

## 2021-03-10 DIAGNOSIS — R0602 Shortness of breath: Secondary | ICD-10-CM | POA: Diagnosis present

## 2021-03-10 DIAGNOSIS — U071 COVID-19: Secondary | ICD-10-CM | POA: Insufficient documentation

## 2021-03-10 DIAGNOSIS — Z5321 Procedure and treatment not carried out due to patient leaving prior to being seen by health care provider: Secondary | ICD-10-CM | POA: Insufficient documentation

## 2021-03-10 MED ORDER — ALBUTEROL SULFATE HFA 108 (90 BASE) MCG/ACT IN AERS: 2.0000 | INHALATION_SPRAY | RESPIRATORY_TRACT | Status: DC | PRN

## 2021-03-10 NOTE — ED Provider Notes (Signed)
Emergency Medicine Provider Triage Evaluation Note  Theresa Moran , a 60 y.o. female  was evaluated in triage.  Pt complains of increasing shortness of breath and low oxygen in the setting of COVID-19.  Tested positive at home and daughter reports she has had multiple times of a decreased oxygen saturations  Review of Systems  Positive: Shortness of breath, cough Negative: Chest pain, fevers  Physical Exam  BP (!) 152/88 (BP Location: Left Arm)   Pulse (!) 110   Temp 98 F (36.7 C) (Oral)   Resp 18   Ht 5\' 6"  (1.676 m)   Wt 124.7 kg   SpO2 95%   BMI 44.39 kg/m  Gen:   Awake, no distress   Resp:  Normal effort  MSK:   Moves extremities without difficulty  Other:    Medical Decision Making  Medically screening exam initiated at 6:26 PM.  Appropriate orders placed.  TEARSA KOWALEWSKI was informed that the remainder of the evaluation will be completed by another provider, this initial triage assessment does not replace that evaluation, and the importance of remaining in the ED until their evaluation is complete.  Tachycardic in triage   Rhae Hammock, PA-C 03/10/21 Katina Degree, MD 03/12/21 1453

## 2021-03-10 NOTE — ED Triage Notes (Signed)
Patient c/o SOB x 2 and patient's daughter reports that the patient's Sats at home were 89%. Sats in triage-95% on room air. Patient states she tested Covid + 3 days ago.

## 2021-03-20 ENCOUNTER — Emergency Department (HOSPITAL_COMMUNITY)
Admission: EM | Admit: 2021-03-20 | Discharge: 2021-03-20 | Disposition: A | Discharge status: Home / Self Care | Payer: Medicare Other | Attending: Emergency Medicine | Admitting: Emergency Medicine

## 2021-03-20 ENCOUNTER — Other Ambulatory Visit: Payer: Self-pay

## 2021-03-20 ENCOUNTER — Encounter (HOSPITAL_COMMUNITY): Payer: Self-pay

## 2021-03-20 ENCOUNTER — Emergency Department (HOSPITAL_COMMUNITY): Payer: Medicare Other

## 2021-03-20 DIAGNOSIS — S82842A Displaced bimalleolar fracture of left lower leg, initial encounter for closed fracture: Secondary | ICD-10-CM

## 2021-03-20 DIAGNOSIS — Z87891 Personal history of nicotine dependence: Secondary | ICD-10-CM | POA: Insufficient documentation

## 2021-03-20 DIAGNOSIS — S82845A Nondisplaced bimalleolar fracture of left lower leg, initial encounter for closed fracture: Secondary | ICD-10-CM | POA: Diagnosis not present

## 2021-03-20 DIAGNOSIS — S8265XA Nondisplaced fracture of lateral malleolus of left fibula, initial encounter for closed fracture: Secondary | ICD-10-CM | POA: Insufficient documentation

## 2021-03-20 DIAGNOSIS — S8255XA Nondisplaced fracture of medial malleolus of left tibia, initial encounter for closed fracture: Secondary | ICD-10-CM | POA: Diagnosis not present

## 2021-03-20 DIAGNOSIS — J45909 Unspecified asthma, uncomplicated: Secondary | ICD-10-CM | POA: Insufficient documentation

## 2021-03-20 DIAGNOSIS — W01198A Fall on same level from slipping, tripping and stumbling with subsequent striking against other object, initial encounter: Secondary | ICD-10-CM | POA: Diagnosis not present

## 2021-03-20 DIAGNOSIS — S99912A Unspecified injury of left ankle, initial encounter: Secondary | ICD-10-CM | POA: Diagnosis present

## 2021-03-20 MED ORDER — MORPHINE SULFATE (PF) 4 MG/ML IV SOLN
6.0000 mg | Freq: Once | INTRAVENOUS | Status: AC
  Administered 2021-03-20: 6 mg via INTRAVENOUS
  Filled 2021-03-20: qty 2

## 2021-03-20 NOTE — ED Provider Notes (Signed)
[facility] EMERGENCY DEPARTMENT Provider Note   CSN: [id number] Arrival date & time: 03/20/21  1844     History Chief Complaint  Patient presents with   Lytle Michaels    Theresa Moran is a 60 y.o. female.  Patient is a 60 year old female who presents with left ankle pain.  She said she was stepping over a dog gate and her toe got stuck and she tripped.  She fell on her left ankle, causing it to twist.  She has pain and swelling to her left ankle.  She denies any other injuries.  She did not hit her head.  No loss of consciousness.  No neck or back pain.  She said she has had a prior fracture to that ankle but she does not have hardware.  She is not on anticoagulants.      Past Medical History:  Diagnosis Date   Allergy    Asthma    Chronic pain    Depression    Gallstones    GERD (gastroesophageal reflux disease)    Neuromuscular disorder ([place])    Urticaria     Patient Active Problem List   Diagnosis Date Noted   H/O food anaphylaxis 02/12/2020   Chronic idiopathic urticaria 03/13/2016   Adverse food reaction 03/13/2016   Allergic rhinitis 03/13/2016   Chronic pain 03/08/2014   Reflex sympathetic dystrophy of lower extremity 09/13/2013   Depressive disorder 12/23/2012    Past Surgical History:  Procedure Laterality Date   APPENDECTOMY     BREAST REDUCTION SURGERY     BREAST SURGERY     CHOLECYSTECTOMY     NASAL SEPTUM SURGERY       OB History   No obstetric history on file.     Family History  Problem Relation Age of Onset   Diabetes Father    Stroke Father    Allergic rhinitis Neg Hx    Angioedema Neg Hx    Asthma Neg Hx    Eczema Neg Hx    Immunodeficiency Neg Hx    Urticaria Neg Hx     Social History   Tobacco Use   Smoking status: Former    Packs/day: 1.00    Years: 15.00    Pack years: 15.00    Types: Cigarettes   Smokeless tobacco: Never  Vaping Use   Vaping Use: Never used  Substance Use Topics   Alcohol use: Yes     Comment: occ   Drug use: No    Home Medications Prior to Admission medications   Medication Sig Start Date End Date Taking? Authorizing Provider  baclofen (LIORESAL) 10 MG tablet Take 10 mg by mouth 2 (two) times daily.    [provider]  busPIRone (BUSPAR) 10 MG tablet Take 1 tablet (10 mg total) by mouth 2 (two) times daily. 02/12/20   Daleen Squibb, MD  Gabapentin, Once-Daily, 600 MG TABS Take by mouth in the morning and at bedtime.     [provider]  metoprolol succinate (TOPROL-XL) 25 MG 24 hr tablet TAKE 1/2 TABLET(12.5 MG) BY MOUTH DAILY 02/12/20   Jacelyn Pi, Lilia Argue, MD  nortriptyline (PAMELOR) 50 MG capsule Take 100 mg by mouth at bedtime.    [provider]  omeprazole (PRILOSEC) 20 MG capsule Take 1 capsule (20 mg total) by mouth 2 (two) times daily before a meal. 01/15/20   Jacelyn Pi, Lilia Argue, MD  predniSONE (DELTASONE) 20 MG tablet 2 tabs po daily x 4  days 12/03/20   Deno Etienne, DO  UNABLE TO FIND Med Name: reports taking Nucynta for pain    [provider]    Allergies    Shellfish allergy, Fish-derived products, and Shellfish-derived products  Review of Systems   Review of Systems  Constitutional:  Negative for fever.  Gastrointestinal:  Negative for nausea and vomiting.  Musculoskeletal:  Positive for arthralgias and joint swelling. Negative for back pain and neck pain.  Skin:  Negative for wound.  Neurological:  Negative for weakness, numbness and headaches.   Physical Exam Updated Vital Signs BP (!) 142/80   Pulse (!) 112   Temp 98.2 F (36.8 C) (Oral)   Resp 12   Ht 5\' 6"  (1.676 m)   Wt 124.7 kg   SpO2 97%   BMI 44.39 kg/m   Physical Exam Constitutional:      Appearance: She is well-developed.  HENT:     Head: Normocephalic and atraumatic.  Cardiovascular:     Rate and Rhythm: Normal rate.  Pulmonary:     Effort: Pulmonary effort is normal.  Musculoskeletal:        General: Tenderness present.      Cervical back: Normal range of motion and neck supple.     Comments: Positive diffuse swelling and tenderness to the left ankle.  There is no pain to the foot or knee.  Pedal pulses are intact.  She has normal sensation and motor function distally.  No open wounds.  Skin:    General: Skin is warm and dry.  Neurological:     Mental Status: She is alert and oriented to person, place, and time.    ED Results / Procedures / Treatments   Labs (all labs ordered are listed, but only abnormal results are displayed) Labs Reviewed - No data to display  EKG None  Radiology DG Ankle Complete Left  Result Date: 03/20/2021 CLINICAL DATA:  Status post fall. EXAM: LEFT ANKLE COMPLETE - 3+ VIEW COMPARISON:  December 27, 2009 FINDINGS: Acute, nondisplaced fractures are seen involving the left lateral malleolus and left medial malleolus. An ill-defined deformity of the posterior malleolus is also suspected. There is no evidence of arthropathy or other focal bone abnormality. There is moderate to marked severity diffuse soft tissue swelling. IMPRESSION: 1. Acute, nondisplaced fractures of the left lateral malleolus and left medial malleolus. 2. Suspect nondisplaced fracture of the posterior malleolus. Electronically Signed   By: Virgina Norfolk M.D.   On: 03/20/2021 19:38    Procedures Procedures   Medications Ordered in ED Medications  morphine 4 MG/ML injection 6 mg (6 mg Intravenous Given 03/20/21 1908)    ED Course  I have reviewed the triage vital signs and the nursing notes.  Pertinent labs & imaging results that were available during my care of the patient were reviewed by me and considered in my medical decision making (see chart for details).    MDM Rules/Calculators/A&P                           Patient is a 60 year old female who presents with an injury to her left ankle.  She has evidence of a bimalleolar fracture and a possible trimalleolar fracture that is nondisplaced.  I spoke with  the orthopedic surgeon on-call, Dr. Johnna Acosta.  He advises to splint the patient and have her follow-up in his office within the next day or 2.  I discussed this with the patient.  She was  placed in a posterior/stirrup splint.  She was advised to be nonweightbearing.  She was discussed the importance of elevation.  She has no evidence of compartment syndrome but she was advised of the symptoms to watch out for and if she were to have any symptoms to return to the emergency department.  She is on Nucynta for chronic pain.  Given this, we will continue her on that.  She said that she was allowed by the pain clinic to increase this.  I advised her to notify the pain clinic tomorrow regarding this.  She was given a prescription for a walker and given crutches to go home with. Final Clinical Impression(s) / ED Diagnoses Final diagnoses:  Closed bimalleolar fracture of left ankle, initial encounter    Rx / DC Orders ED Discharge Orders          Ordered    Walker standard        03/20/21 2232             Malvin Johns, MD 03/20/21 2246

## 2021-03-20 NOTE — Progress Notes (Signed)
Orthopedic Tech Progress Note Patient Details:  Theresa Moran 15-Feb-1961 [id number]  Ortho Devices Type of Ortho Device: Short leg splint, Stirrup splint, Crutches Ortho Device/Splint Location: LLE Ortho Device/Splint Interventions: Ordered, Application, Adjustment   Post Interventions Patient Tolerated: Well Instructions Provided: Care of device, Adjustment of device, Poper ambulation with device  [name] 03/20/2021, 11:36 PM

## 2021-03-20 NOTE — Discharge Instructions (Addendum)
Keep your leg elevated is much as possible.  Do not bear weight until cleared by the orthopedic surgeon.  Follow-up with the orthopedic surgeon as discussed.  Return here as needed if you have any worsening symptoms.

## 2021-03-20 NOTE — ED Triage Notes (Signed)
Pt was walking got her toe stuck in the dog kennel and tripped and fell left ankle pain in splint from ems ankle swelling and deformity noted pedal pulses intact

## 2021-03-21 ENCOUNTER — Ambulatory Visit
Admission: RE | Admit: 2021-03-21 | Discharge: 2021-03-21 | Disposition: A | Discharge status: Home / Self Care | Payer: Medicare Other | Source: Ambulatory Visit | Attending: Orthopaedic Surgery | Admitting: Orthopaedic Surgery

## 2021-03-21 ENCOUNTER — Other Ambulatory Visit: Payer: Self-pay | Admitting: Orthopaedic Surgery

## 2021-03-21 DIAGNOSIS — M25572 Pain in left ankle and joints of left foot: Secondary | ICD-10-CM

## 2021-03-21 DIAGNOSIS — G8929 Other chronic pain: Secondary | ICD-10-CM

## 2021-03-21 DIAGNOSIS — S82852A Displaced trimalleolar fracture of left lower leg, initial encounter for closed fracture: Secondary | ICD-10-CM

## 2021-03-21 HISTORY — DX: Displaced trimalleolar fracture of left lower leg, initial encounter for closed fracture: S82.852A

## 2021-03-25 ENCOUNTER — Encounter (HOSPITAL_COMMUNITY): Payer: Self-pay | Admitting: Orthopaedic Surgery

## 2021-03-25 NOTE — Progress Notes (Addendum)
Theresa Moran denies chest pain or shortness of breath.  Patient denies having any s/s of Covid in her household.  Patient denies any known exposure to Covid.   Patient does not have a PCP.Dr. Kathaleen Bury started Theresa Moran on Aspirin 325 , no instructions were given regarding it, I instructed patient to take it. I instructed patient to shower or wash up well with antibiotic soap, if it is available.  Dry off with a clean towel. Do not put lotion, powder, cologne or deodorant or makeup.No jewelry or piercings. Men may shave their face and neck. Woman should not shave. No nail polish, artificial or acrylic nails. Wear clean clothes, brush your teeth. Glasses, contact lens,dentures or partials may not be worn in the OR. If you need to wear them, please bring a case for glasses, do not wear contacts or bring a case, the hospital does not have contact cases, dentures or partials will have to be removed , make sure they are clean, we will provide a denture cup to put them in. You will need some one to drive you home and a responsible person over the age of 15 to stay with you for the first 24 hours after surgery.

## 2021-03-25 NOTE — Discharge Instructions (Addendum)
Armond Hang, MD [facility]  Please read the following information regarding your care after surgery.  Medications  You only need a prescription for the narcotic pain medicine (ex. oxycodone, Percocet, Norco).  All of the other medicines listed below are available over the counter.  -IMPORTANT: please contact your pain management team after discharge to make sure you continue to remain well controlled for pain postop ? Aleve 2 pills twice a day for the first 3 days after surgery. ? acetominophen (Tylenol) 650 mg every 4-6 hours as you need for minor to moderate pain ? oxycodone as prescribed for severe pain  ? To help prevent blood clots, take aspirin (325 mg) daily for 30 days after surgery.  You should also get up every hour while you are awake to move around.    Weight Bearing ? Do not bear any weight on the operated leg or foot.  Cast / Splint / Dressing ? If you have a splint, do NOT remove this. Keep your splint, cast or dressing clean and dry.  Don't put anything (coat hanger, pencil, etc) down inside of it.  If it gets damp, use a hair dryer on the cool setting to dry it.  If it gets soaked, call the office to schedule an appointment for a cast change.  Swelling IMPORTANT: It is normal for you to have swelling where you had surgery. To reduce swelling and pain, keep at least 3 pillows under your leg so that your toes are above your nose and your heel is above the level of your hip.  It may be necessary to keep your foot or leg elevated for several weeks.  This is critical to helping your incisions heal and your pain to feel better.  Follow Up Call my office at [phone] when you are discharged from the hospital or surgery center to schedule an appointment to be seen 2-3 weeks after surgery.  Call my office at [phone] if you develop a fever >101.5 F, nausea, vomiting, bleeding from the surgical site or severe pain.

## 2021-03-26 ENCOUNTER — Inpatient Hospital Stay (HOSPITAL_COMMUNITY): Payer: Medicare Other | Admitting: Anesthesiology

## 2021-03-26 ENCOUNTER — Inpatient Hospital Stay (HOSPITAL_COMMUNITY): Payer: Medicare Other

## 2021-03-26 ENCOUNTER — Encounter (HOSPITAL_COMMUNITY)
Admission: RE | Disposition: A | Discharge status: Home Health | Payer: Self-pay | Source: Home / Self Care | Attending: Orthopaedic Surgery

## 2021-03-26 ENCOUNTER — Inpatient Hospital Stay (HOSPITAL_COMMUNITY)
Admission: RE | Admit: 2021-03-26 | Discharge: 2021-03-28 | DRG: 493 | Disposition: A | Discharge status: Home Health | Payer: Medicare Other | Attending: Orthopaedic Surgery | Admitting: Orthopaedic Surgery

## 2021-03-26 ENCOUNTER — Other Ambulatory Visit: Payer: Self-pay

## 2021-03-26 ENCOUNTER — Encounter (HOSPITAL_COMMUNITY): Payer: Self-pay | Admitting: Orthopaedic Surgery

## 2021-03-26 DIAGNOSIS — Z6841 Body Mass Index (BMI) 40.0 and over, adult: Secondary | ICD-10-CM

## 2021-03-26 DIAGNOSIS — Z87891 Personal history of nicotine dependence: Secondary | ICD-10-CM | POA: Diagnosis not present

## 2021-03-26 DIAGNOSIS — W010XXA Fall on same level from slipping, tripping and stumbling without subsequent striking against object, initial encounter: Secondary | ICD-10-CM | POA: Diagnosis present

## 2021-03-26 DIAGNOSIS — Z91013 Allergy to seafood: Secondary | ICD-10-CM

## 2021-03-26 DIAGNOSIS — Z9889 Other specified postprocedural states: Secondary | ICD-10-CM

## 2021-03-26 DIAGNOSIS — F32A Depression, unspecified: Secondary | ICD-10-CM | POA: Diagnosis present

## 2021-03-26 DIAGNOSIS — Z419 Encounter for procedure for purposes other than remedying health state, unspecified: Secondary | ICD-10-CM

## 2021-03-26 DIAGNOSIS — Z20822 Contact with and (suspected) exposure to covid-19: Secondary | ICD-10-CM | POA: Diagnosis present

## 2021-03-26 DIAGNOSIS — S82852A Displaced trimalleolar fracture of left lower leg, initial encounter for closed fracture: Secondary | ICD-10-CM | POA: Diagnosis not present

## 2021-03-26 DIAGNOSIS — K219 Gastro-esophageal reflux disease without esophagitis: Secondary | ICD-10-CM | POA: Diagnosis present

## 2021-03-26 HISTORY — PX: ORIF ANKLE FRACTURE: SHX5408

## 2021-03-26 HISTORY — DX: Headache, unspecified: R51.9

## 2021-03-26 LAB — CBC
HCT: 44.4 % (ref 36.0–46.0)
Hemoglobin: 14.9 g/dL (ref 12.0–15.0)
MCH: 31.4 pg (ref 26.0–34.0)
MCHC: 33.6 g/dL (ref 30.0–36.0)
MCV: 93.5 fL (ref 80.0–100.0)
Platelets: 128 10*3/uL — ABNORMAL LOW (ref 150–400)
RBC: 4.75 MIL/uL (ref 3.87–5.11)
RDW: 14 % (ref 11.5–15.5)
WBC: 8.2 10*3/uL (ref 4.0–10.5)
nRBC: 0 % (ref 0.0–0.2)

## 2021-03-26 LAB — BASIC METABOLIC PANEL
Anion gap: 8 (ref 5–15)
BUN: 12 mg/dL (ref 8–23)
CO2: 24 mmol/L (ref 22–32)
Calcium: 9 mg/dL (ref 8.9–10.3)
Chloride: 104 mmol/L (ref 98–111)
Creatinine, Ser: 0.8 mg/dL (ref 0.44–1.00)
GFR, Estimated: >60 mL/min (ref >60)
Glucose, Bld: 92 mg/dL (ref 70–99)
Potassium: 3.9 mmol/L (ref 3.5–5.1)
Sodium: 136 mmol/L (ref 135–145)

## 2021-03-26 LAB — SURGICAL PCR SCREEN
MRSA, PCR: NEGATIVE
Staphylococcus aureus: NEGATIVE

## 2021-03-26 SURGERY — OPEN REDUCTION INTERNAL FIXATION (ORIF) ANKLE FRACTURE
Anesthesia: General | Site: Ankle | Laterality: Left

## 2021-03-26 MED ORDER — ACETAMINOPHEN 325 MG PO TABS: 325.0000 mg | ORAL_TABLET | Freq: Four times a day (QID) | ORAL | Status: DC | PRN

## 2021-03-26 MED ORDER — MORPHINE SULFATE (PF) 2 MG/ML IV SOLN: 0.5000 mg | INTRAVENOUS | Status: DC | PRN

## 2021-03-26 MED ORDER — 0.9 % SODIUM CHLORIDE (POUR BTL) OPTIME
TOPICAL | Status: DC | PRN
  Administered 2021-03-26: 1000 mL

## 2021-03-26 MED ORDER — MIDAZOLAM HCL 2 MG/2ML IJ SOLN
INTRAMUSCULAR | Status: AC
  Administered 2021-03-26: 2 mg via INTRAVENOUS
  Filled 2021-03-26: qty 2

## 2021-03-26 MED ORDER — DEXAMETHASONE SODIUM PHOSPHATE 4 MG/ML IJ SOLN
INTRAMUSCULAR | Status: DC | PRN
  Administered 2021-03-26: 4 mg via INTRAVENOUS
  Administered 2021-03-26: 2 mg via INTRAVENOUS

## 2021-03-26 MED ORDER — MUPIROCIN 2 % EX OINT
TOPICAL_OINTMENT | CUTANEOUS | Status: AC
  Administered 2021-03-26: 1 via TOPICAL
  Filled 2021-03-26: qty 22

## 2021-03-26 MED ORDER — LACTATED RINGERS IV SOLN: INTRAVENOUS | Status: AC

## 2021-03-26 MED ORDER — LIDOCAINE 2% (20 MG/ML) 5 ML SYRINGE
INTRAMUSCULAR | Status: DC | PRN
  Administered 2021-03-26: 100 mg via INTRAVENOUS

## 2021-03-26 MED ORDER — CEFAZOLIN IN SODIUM CHLORIDE 3-0.9 GM/100ML-% IV SOLN
3.0000 g | INTRAVENOUS | Status: AC
  Administered 2021-03-26: 3 g via INTRAVENOUS
  Filled 2021-03-26: qty 100

## 2021-03-26 MED ORDER — CEFAZOLIN SODIUM-DEXTROSE 2-4 GM/100ML-% IV SOLN
2.0000 g | Freq: Three times a day (TID) | INTRAVENOUS | Status: AC
  Administered 2021-03-26 – 2021-03-27 (×2): 2 g via INTRAVENOUS
  Filled 2021-03-26 (×2): qty 100

## 2021-03-26 MED ORDER — FENTANYL CITRATE (PF) 250 MCG/5ML IJ SOLN
INTRAMUSCULAR | Status: AC
  Filled 2021-03-26: qty 5

## 2021-03-26 MED ORDER — HYDROCODONE-ACETAMINOPHEN 7.5-325 MG PO TABS
1.0000 | ORAL_TABLET | ORAL | Status: DC | PRN
  Administered 2021-03-27 – 2021-03-28 (×4): 2 via ORAL
  Filled 2021-03-26 (×4): qty 2

## 2021-03-26 MED ORDER — MIDAZOLAM HCL 2 MG/2ML IJ SOLN: 2.0000 mg | Freq: Once | INTRAMUSCULAR | Status: AC

## 2021-03-26 MED ORDER — PROPOFOL 10 MG/ML IV BOLUS
INTRAVENOUS | Status: DC | PRN
  Administered 2021-03-26: 50 mg via INTRAVENOUS
  Administered 2021-03-26: 30 mg via INTRAVENOUS
  Administered 2021-03-26: 20 mg via INTRAVENOUS
  Administered 2021-03-26: 200 mg via INTRAVENOUS

## 2021-03-26 MED ORDER — PROPOFOL 10 MG/ML IV BOLUS
INTRAVENOUS | Status: AC
  Filled 2021-03-26: qty 20

## 2021-03-26 MED ORDER — MUPIROCIN 2 % EX OINT: 1.0000 "application " | TOPICAL_OINTMENT | Freq: Once | CUTANEOUS | Status: AC

## 2021-03-26 MED ORDER — ONDANSETRON HCL 4 MG/2ML IJ SOLN: 4.0000 mg | Freq: Four times a day (QID) | INTRAMUSCULAR | Status: DC | PRN

## 2021-03-26 MED ORDER — PANTOPRAZOLE SODIUM 40 MG PO TBEC
40.0000 mg | DELAYED_RELEASE_TABLET | Freq: Two times a day (BID) | ORAL | Status: DC
  Administered 2021-03-26 – 2021-03-28 (×5): 40 mg via ORAL
  Filled 2021-03-26 (×5): qty 1

## 2021-03-26 MED ORDER — FENTANYL CITRATE (PF) 250 MCG/5ML IJ SOLN
INTRAMUSCULAR | Status: DC | PRN
  Administered 2021-03-26: 50 ug via INTRAVENOUS

## 2021-03-26 MED ORDER — PHENYLEPHRINE HCL (PRESSORS) 10 MG/ML IV SOLN
INTRAVENOUS | Status: AC
  Filled 2021-03-26: qty 2

## 2021-03-26 MED ORDER — CHLORHEXIDINE GLUCONATE 0.12 % MT SOLN
15.0000 mL | Freq: Once | OROMUCOSAL | Status: AC
  Administered 2021-03-26: 15 mL via OROMUCOSAL
  Filled 2021-03-26: qty 15

## 2021-03-26 MED ORDER — FENTANYL CITRATE PF 50 MCG/ML IJ SOSY
50.0000 ug | PREFILLED_SYRINGE | Freq: Once | INTRAMUSCULAR | Status: AC
  Administered 2021-03-26: 50 ug via INTRAVENOUS

## 2021-03-26 MED ORDER — VANCOMYCIN HCL 1000 MG IV SOLR
INTRAVENOUS | Status: DC | PRN
  Administered 2021-03-26: 1000 mg via TOPICAL

## 2021-03-26 MED ORDER — DEXAMETHASONE SODIUM PHOSPHATE 10 MG/ML IJ SOLN
INTRAMUSCULAR | Status: DC | PRN
  Administered 2021-03-26: 5 mg via INTRAVENOUS

## 2021-03-26 MED ORDER — VANCOMYCIN HCL 1000 MG IV SOLR
INTRAVENOUS | Status: AC
  Filled 2021-03-26: qty 20

## 2021-03-26 MED ORDER — ORAL CARE MOUTH RINSE: 15.0000 mL | Freq: Once | OROMUCOSAL | Status: AC

## 2021-03-26 MED ORDER — LACTATED RINGERS IV SOLN: INTRAVENOUS | Status: DC

## 2021-03-26 MED ORDER — ONDANSETRON HCL 4 MG PO TABS: 4.0000 mg | ORAL_TABLET | Freq: Four times a day (QID) | ORAL | Status: DC | PRN

## 2021-03-26 MED ORDER — ASPIRIN 325 MG PO TABS
325.0000 mg | ORAL_TABLET | Freq: Every day | ORAL | Status: DC
  Administered 2021-03-27 – 2021-03-28 (×2): 325 mg via ORAL
  Filled 2021-03-26 (×2): qty 1

## 2021-03-26 MED ORDER — FENTANYL CITRATE (PF) 100 MCG/2ML IJ SOLN
INTRAMUSCULAR | Status: AC
  Filled 2021-03-26: qty 2

## 2021-03-26 MED ORDER — ONDANSETRON HCL 4 MG/2ML IJ SOLN
INTRAMUSCULAR | Status: DC | PRN
  Administered 2021-03-26: 4 mg via INTRAVENOUS

## 2021-03-26 MED ORDER — NORTRIPTYLINE HCL 25 MG PO CAPS
100.0000 mg | ORAL_CAPSULE | Freq: Every day | ORAL | Status: DC
  Administered 2021-03-26 – 2021-03-27 (×2): 100 mg via ORAL
  Filled 2021-03-26 (×3): qty 4

## 2021-03-26 MED ORDER — ROPIVACAINE HCL 5 MG/ML IJ SOLN
INTRAMUSCULAR | Status: DC | PRN
  Administered 2021-03-26: 30 mL via PERINEURAL
  Administered 2021-03-26: 20 mL via PERINEURAL

## 2021-03-26 MED ORDER — ACETAMINOPHEN 500 MG PO TABS
500.0000 mg | ORAL_TABLET | Freq: Four times a day (QID) | ORAL | Status: AC
  Administered 2021-03-26 – 2021-03-27 (×3): 500 mg via ORAL
  Filled 2021-03-26 (×3): qty 1

## 2021-03-26 MED ORDER — PHENYLEPHRINE 40 MCG/ML (10ML) SYRINGE FOR IV PUSH (FOR BLOOD PRESSURE SUPPORT)
PREFILLED_SYRINGE | INTRAVENOUS | Status: DC | PRN
  Administered 2021-03-26: 120 ug via INTRAVENOUS
  Administered 2021-03-26: 40 ug via INTRAVENOUS
  Administered 2021-03-26: 80 ug via INTRAVENOUS
  Administered 2021-03-26: 120 ug via INTRAVENOUS

## 2021-03-26 MED ORDER — BUSPIRONE HCL 10 MG PO TABS
10.0000 mg | ORAL_TABLET | Freq: Two times a day (BID) | ORAL | Status: DC
  Administered 2021-03-26 – 2021-03-28 (×4): 10 mg via ORAL
  Filled 2021-03-26 (×4): qty 1

## 2021-03-26 MED ORDER — HYDROCODONE-ACETAMINOPHEN 5-325 MG PO TABS
1.0000 | ORAL_TABLET | ORAL | Status: DC | PRN
  Administered 2021-03-27: 2 via ORAL
  Filled 2021-03-26: qty 2

## 2021-03-26 MED ORDER — CLONIDINE HCL (ANALGESIA) 100 MCG/ML EP SOLN
EPIDURAL | Status: DC | PRN
  Administered 2021-03-26: 40 ug
  Administered 2021-03-26: 60 ug

## 2021-03-26 MED ORDER — PHENYLEPHRINE HCL-NACL 20-0.9 MG/250ML-% IV SOLN
INTRAVENOUS | Status: DC | PRN
  Administered 2021-03-26: 30 ug/min via INTRAVENOUS

## 2021-03-26 MED ORDER — GABAPENTIN 600 MG PO TABS
600.0000 mg | ORAL_TABLET | Freq: Two times a day (BID) | ORAL | Status: DC
  Administered 2021-03-26 – 2021-03-28 (×4): 600 mg via ORAL
  Filled 2021-03-26 (×6): qty 1

## 2021-03-26 MED ORDER — BACLOFEN 10 MG PO TABS
20.0000 mg | ORAL_TABLET | Freq: Two times a day (BID) | ORAL | Status: DC
  Administered 2021-03-26 – 2021-03-28 (×4): 20 mg via ORAL
  Filled 2021-03-26 (×4): qty 2

## 2021-03-26 SURGICAL SUPPLY — 77 items
ANCHOR SUT KEITH ABD SZ2 STR (SUTURE) ×2 IMPLANT
BAG COUNTER SPONGE SURGICOUNT (BAG) IMPLANT
BIT DRILL 2.0 (BIT) ×1
BIT DRILL 2.5X2.75 QC CALB (BIT) ×1 IMPLANT
BIT DRILL 2.9 CANN QC NONSTRL (BIT) ×1 IMPLANT
BIT DRILL 2XNS DISP SS SM FRAG (BIT) IMPLANT
BIT DRILL CALIBRATED 2.7 (BIT) ×1 IMPLANT
BIT DRL 2XNS DISP SS SM FRAG (BIT) ×1
BNDG COHESIVE 4X5 TAN ST LF (GAUZE/BANDAGES/DRESSINGS) ×2 IMPLANT
BNDG COHESIVE 6X5 TAN ST LF (GAUZE/BANDAGES/DRESSINGS) ×2 IMPLANT
BNDG ELASTIC 4X5.8 VLCR STR LF (GAUZE/BANDAGES/DRESSINGS) ×3 IMPLANT
BNDG ELASTIC 6X5.8 VLCR STR LF (GAUZE/BANDAGES/DRESSINGS) ×2 IMPLANT
BNDG GAUZE ELAST 4 BULKY (GAUZE/BANDAGES/DRESSINGS) ×2 IMPLANT
CHLORAPREP W/TINT 26 (MISCELLANEOUS) ×4 IMPLANT
COVER SURGICAL LIGHT HANDLE (MISCELLANEOUS) ×2 IMPLANT
CUFF TOURN SGL QUICK 34 (TOURNIQUET CUFF) ×1
CUFF TRNQT CYL 34X4.125X (TOURNIQUET CUFF) ×1 IMPLANT
DECANTER SPIKE VIAL GLASS SM (MISCELLANEOUS) IMPLANT
DRAPE C-ARM 42X120 X-RAY (DRAPES) ×1 IMPLANT
DRAPE EXTREMITY T 121X128X90 (DISPOSABLE) ×2 IMPLANT
DRAPE OEC MINIVIEW 54X84 (DRAPES) ×2 IMPLANT
DRAPE ORTHO SPLIT 77X108 STRL (DRAPES) ×2
DRAPE SURG ORHT 6 SPLT 77X108 (DRAPES) ×2 IMPLANT
DRAPE U-SHAPE 47X51 STRL (DRAPES) ×2 IMPLANT
DRSG ADAPTIC 3X8 NADH LF (GAUZE/BANDAGES/DRESSINGS) ×2 IMPLANT
DRSG PAD ABDOMINAL 8X10 ST (GAUZE/BANDAGES/DRESSINGS) ×9 IMPLANT
ELECT REM PT RETURN 15FT ADLT (MISCELLANEOUS) ×2 IMPLANT
FACESHIELD WRAPAROUND (MASK) ×2 IMPLANT
FACESHIELD WRAPAROUND OR TEAM (MASK) ×1 IMPLANT
FIXATION ZIPTIGHT ANKLE SNDSMS (Ankle) IMPLANT
GAUZE SPONGE 4X4 12PLY STRL (GAUZE/BANDAGES/DRESSINGS) ×4 IMPLANT
GAUZE SPONGE 4X4 12PLY STRL LF (GAUZE/BANDAGES/DRESSINGS) ×1 IMPLANT
GAUZE XEROFORM 5X9 LF (GAUZE/BANDAGES/DRESSINGS) ×2 IMPLANT
GLOVE SRG 8 PF TXTR STRL LF DI (GLOVE) ×1 IMPLANT
GLOVE SURG MICRO LTX SZ7.5 (GLOVE) ×2 IMPLANT
GLOVE SURG UNDER POLY LF SZ8 (GLOVE) ×1
K-WIRE ACE 1.6X6 (WIRE) ×6
KIT BASIN OR (CUSTOM PROCEDURE TRAY) ×2 IMPLANT
KIT TURNOVER KIT A (KITS) IMPLANT
KWIRE ACE 1.6X6 (WIRE) IMPLANT
NDL MAYO CATGUT SZ4 TPR NDL (NEEDLE) IMPLANT
NEEDLE HYPO 22GX1.5 SAFETY (NEEDLE) ×2 IMPLANT
NEEDLE MAYO CATGUT SZ4 (NEEDLE) IMPLANT
NS IRRIG 1000ML POUR BTL (IV SOLUTION) ×2 IMPLANT
PACK ORTHO EXTREMITY (CUSTOM PROCEDURE TRAY) ×2 IMPLANT
PAD ABD 8X10 STRL (GAUZE/BANDAGES/DRESSINGS) ×1 IMPLANT
PAD CAST 4YDX4 CTTN HI CHSV (CAST SUPPLIES) ×6 IMPLANT
PADDING CAST COTTON 4X4 STRL (CAST SUPPLIES) ×4
PADDING CAST COTTON 6X4 STRL (CAST SUPPLIES) ×2 IMPLANT
PLATE LOCK 4H 109 LT DIST FIB (Plate) ×1 IMPLANT
PROTECTOR NERVE ULNAR (MISCELLANEOUS) ×2 IMPLANT
SCREW ACE CAN 4.0 40M (Screw) ×1 IMPLANT
SCREW CORTICAL 2.7 MM 22MM (Screw) ×1 IMPLANT
SCREW LOCK CORT STAR 3.5X12 (Screw) ×3 IMPLANT
SCREW LOCK CORT STAR 3.5X14 (Screw) ×2 IMPLANT
SCREW LOCK CORT STAR 3.5X16 (Screw) ×1 IMPLANT
SCREW NON LOCKING LP 3.5 14MM (Screw) ×2 IMPLANT
SCREW NON LOCKING LP 3.5 16MM (Screw) ×1 IMPLANT
SPLINT PLASTER CAST XFAST 5X30 (CAST SUPPLIES) IMPLANT
SPLINT PLASTER XFAST SET 5X30 (CAST SUPPLIES) ×1
SPONGE T-LAP 18X18 ~~LOC~~+RFID (SPONGE) ×2 IMPLANT
STAPLER VISISTAT 35W (STAPLE) ×2 IMPLANT
STRIP CLOSURE SKIN 1/2X4 (GAUZE/BANDAGES/DRESSINGS) ×2 IMPLANT
SUCTION FRAZIER HANDLE 10FR (MISCELLANEOUS) ×1
SUCTION TUBE FRAZIER 10FR DISP (MISCELLANEOUS) ×1 IMPLANT
SUT ETHILON 3 0 PS 1 (SUTURE) ×3 IMPLANT
SUT MON AB 3-0 SH 27 (SUTURE) ×1
SUT MON AB 3-0 SH27 (SUTURE) ×1 IMPLANT
SUT VIC AB 2-0 CT1 27 (SUTURE) ×1
SUT VIC AB 2-0 CT1 TAPERPNT 27 (SUTURE) ×1 IMPLANT
SUT VIC AB 2-0 SH 27 (SUTURE) ×1
SUT VIC AB 2-0 SH 27XBRD (SUTURE) ×1 IMPLANT
SUT VIC AB 3-0 PS2 18 (SUTURE) ×1
SUT VIC AB 3-0 PS2 18XBRD (SUTURE) ×1 IMPLANT
SYR CONTROL 10ML LL (SYRINGE) ×2 IMPLANT
WATER STERILE IRR 1000ML POUR (IV SOLUTION) ×2 IMPLANT
ZIPTIGHT ANKLE SYNODESMOSS FIX (Ankle) ×2 IMPLANT

## 2021-03-26 NOTE — Brief Op Note (Signed)
03/26/2021  5:40 PM  PATIENT:  Theresa Moran  60 y.o. female  PRE-OPERATIVE DIAGNOSIS:  Closed trimalleolar fracture of left ankle  POST-OPERATIVE DIAGNOSIS:  Closed trimalleolar fracture of left ankle  PROCEDURE:  Procedure(s): OPEN REDUCTION INTERNAL FIXATION (ORIF) ANKLE FRACTURE (Left)  Left distal fibula ORIF Left medial malleolus ORIF Left ankle syndesmosis ORIF  SURGEON:  Surgeon(s) and Role:    * Armond Hang, MD - Primary  PHYSICIAN ASSISTANT: None  ASSISTANTS: none   ANESTHESIA:   regional and general  EBL:  Minimal  BLOOD ADMINISTERED:none  DRAINS: none   LOCAL MEDICATIONS USED:  NONE  SPECIMEN:  No Specimen  DISPOSITION OF SPECIMEN:  N/A  COUNTS:  YES  TOURNIQUET:   Total Tourniquet Time Documented: Thigh (Left) - 105 minutes Total: Thigh (Left) - 105 minutes   DICTATION: .Note written in EPIC  PLAN OF CARE: Admit to inpatient   PATIENT DISPOSITION:  PACU - hemodynamically stable.   Delay start of Pharmacological VTE agent (>24hrs) due to surgical blood loss or risk of bleeding: yes

## 2021-03-26 NOTE — Anesthesia Procedure Notes (Signed)
Anesthesia Regional Block: Popliteal block   Pre-Anesthetic Checklist: , timeout performed,  Correct Patient, Correct Site, Correct Laterality,  Correct Procedure, Correct Position, site marked,  Risks and benefits discussed,  Surgical consent,  Pre-op evaluation,  At surgeon's request and post-op pain management  Laterality: Lower and Left  Prep: chloraprep       Needles:  Injection technique: Single-shot  Needle Type: Stimiplex     Needle Length: 10cm  Needle Gauge: 21     Additional Needles:   Procedures:,,,, ultrasound used (permanent image in chart),,   Motor weakness within 5 minutes.  Narrative:  Start time: 03/26/2021 2:59 PM End time: 03/26/2021 3:07 PM Injection made incrementally with aspirations every 5 mL.  Performed by: Personally  Anesthesiologist: Nolon Nations, MD  Additional Notes: Nerve located and needle positioned with direct ultrasound guidance. Good perineural spread. Patient tolerated well.

## 2021-03-26 NOTE — Anesthesia Preprocedure Evaluation (Addendum)
Anesthesia Evaluation  Patient identified by MRN, date of birth, ID band Patient awake    Reviewed: Allergy & Precautions, NPO status , Patient's Chart, lab work & pertinent test results  Airway Mallampati: II  TM Distance: >3 FB Neck ROM: Full    Dental  (+) Dental Advisory Given   Pulmonary asthma , former smoker,    Pulmonary exam normal breath sounds clear to auscultation       Cardiovascular negative cardio ROS Normal cardiovascular exam Rhythm:Regular Rate:Normal     Neuro/Psych  Headaches, PSYCHIATRIC DISORDERS Depression  Neuromuscular disease    GI/Hepatic Neg liver ROS, GERD  ,  Endo/Other  Morbid obesity  Renal/GU negative Renal ROS     Musculoskeletal negative musculoskeletal ROS (+)   Abdominal (+) + obese,   Peds  Hematology negative hematology ROS (+)   Anesthesia Other Findings   Reproductive/Obstetrics                            Anesthesia Physical Anesthesia Plan  ASA: 3  Anesthesia Plan: General   Post-op Pain Management: GA combined w/ Regional for post-op pain   Induction: Intravenous  PONV Risk Score and Plan: 3 and Ondansetron, Dexamethasone, Treatment may vary due to age or medical condition, Midazolam and Diphenhydramine  Airway Management Planned: LMA  Additional Equipment: None  Intra-op Plan:   Post-operative Plan: Extubation in OR  Informed Consent: I have reviewed the patients History and Physical, chart, labs and discussed the procedure including the risks, benefits and alternatives for the proposed anesthesia with the patient or authorized representative who has indicated his/her understanding and acceptance.     Dental advisory given  Plan Discussed with: CRNA  Anesthesia Plan Comments:        Anesthesia Quick Evaluation

## 2021-03-26 NOTE — Op Note (Addendum)
03/26/2021  8:51 PM   PATIENT: Theresa Moran  60 y.o. female  MRN: [id number]   PRE-OPERATIVE DIAGNOSIS:   Closed trimalleolar fracture of left ankle   POST-OPERATIVE DIAGNOSIS:   Closed trimalleolar fracture of left ankle   PROCEDURE: Open reduction internal fixation of left trimalleolar ankle fracture without fixation of posterior lip Open reduction internal fixation of left ankle syndesmosis   SURGEON:  Armond Hang, MD   ASSISTANT: None   ANESTHESIA: General, regional   EBL: Minimal   TOURNIQUET:    Total Tourniquet Time Documented: Thigh (Left) - 105 minutes Total: Thigh (Left) - 937 minutes    COMPLICATIONS: None apparent   DISPOSITION: Extubated, awake and stable to recovery.   INDICATION FOR PROCEDURE: The patient presents with left ankle fracture sustained on 03/20/21 when she tripped over dog gate. She was closed reduced in ED and splinted. She followed up as outpatient and was scheduled for surgery. 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 She goes to pain management for this issue and notes this has been stable for many years.  We discussed the diagnosis, alternative treatment options, risks and benefits of the above surgical intervention, as well as alternative non-operative treatments. All questions/concerns were addressed and the patient/family demonstrated appropriate understanding of the diagnosis, the procedure, the postoperative course, and overall prognosis. The patient wished to proceed with surgical intervention and signed an informed surgical consent as such, in each others presence prior to surgery.  The risks and benefits were presented and reviewed. The risks due to hardware failure/irritation, infection, stiffness, nerve/vessel/tendon injury, nonunion/malunion, wound healing issues, development of arthritis, failure of this surgery, possibility of external fixation with  delayed definitive surgery, need for further surgery, thromboembolic events, worsening/non-improvement of her RSD in left leg, anesthesia/hospital related complications, amputation, death among others were discussed.   PROCEDURE IN DETAIL: After preoperative consent was obtained and the correct operative site was identified, the patient was brought to the operating room supine on stretcher and transferred onto operating table.  General anesthesia was induced. Preoperative antibiotics were administered. Surgical timeout was taken. The patient was then positioned supine with an ipsilateral hip bump. The operative lower extremity was prepped and draped in standard sterile fashion with a tourniquet around the thigh. The extremity was exsanguinated and the tourniquet was inflated to 250 mmHg.  A standard lateral incision was made over the distal fibula. Dissection was carried down to the level of the fibula and the fracture site identified. The superficial peroneal nerve was identified and protected throughout the procedure. The fibula was noted to be shortened with interposed periosteum. The fracture was debrided and the edges defined to achieve cortical read. Reduction maneuver was performed using pointed reduction forceps and lobster forceps. In this manner, the fibula length was restored and fracture reduced. A 3.5 lag screw was placed using lag by technique and excellent compression was obtained. Due to poor bone quality and extensive comminution at the fracture site, it was decided to use an anatomic locking distal fibula plate. We then selected a 7-holed Zimmer locking plate to match the anatomy of the distal fibula and placed it laterally. This was implanted under intraoperative fluoroscopy with a combination of distal locking screws and proximal cortical screws.  An anteromedial approach to the medial malleolus was then performed. Dissection was carried down to the fracture site after protecting  surrounding veins. The fracture edges were sharply debrided to obtain cortical  read and interposed soft tissues blocking reduction were removed. A freer elevator was used to reduce the medial malleolus fragment and a dental pick was used to provisionally stabilize the fracture. A single Kirschner wire was placed under intraoperative fluoroscopy to provisionally hold the reduction. Then, a partially threaded cannulated 4.0 screw of 40 mm length was implanted after using cannulated drill. Excellent compression was noted at the fracture site and the screw head was buried to avoid irritation of surrounding soft tissues. The joint surface was noted to be reduced and congruent.   A manual external rotation stress radiograph was obtained and demonstrated unstable ankle mortise. Therefore, we proceeded with syndesmosis internal fixation. A Kirschner wire was placed through the plate at the level of the syndesmosis, taking care to stay away from lag screw/fracture. After verifying appropriate position of the wire, we overdrilled it using a cannulated drill. The Zimmer Ziptight syndesmosis fixation device was passed through the drill tunnels. We then dorsiflexed the ankle and set the syndesmosis device in place. We again stressed the ankle under intraoperative fluoroscopy and noted the mortise to be completely stable. Final radiographs were taken with all hardware in place.  The posterior malleolus fragment was also evaluated and noted to be reduced and stable on intraoperative fluoroscopy.  The surgical sites were thoroughly irrigated. The tourniquet was deflated and hemostasis achieved. The deep layers were closed using 2-0 vicryl and the subcutaneous tissues closed using 3-0 vicryl. The skin was closed without tension using 3-0 nylon suture.    The leg was cleaned with saline and sterile xeroform dressings with gauze were applied. A well padded bulky short leg splint was applied. The patient was awakened from  anesthesia and transported to the recovery room in stable condition.    FOLLOW UP PLAN: -transfer to PACU, then RNF -IV Ancef x 24 hrs -strict NWB operative extremity, maximum elevation -maintain short leg splint until follow up -pain management to be consulted for discharge pain med recs -DVT Ppx: Aspirin 325 mg once daily x 30 days -physical therapy to assist with NWB precautions and mobilization -case management referral placed to assist with establishment of HHC/placement in SNF -sutures out in 2-3 weeks with exchange of short leg splint to short leg cast in outpatient office   RADIOGRAPHS: AP, lateral, oblique and stress radiographs of the left ankle were obtained intraoperatively. These showed interval reduction and fixation of the fractures. Manual stress radiographs were taken and the ankle mortise and tibiofibular relationship were noted to be stable following fixation. All hardware is appropriately positioned and of the appropriate lengths. No other acute injuries are noted.   Armond Hang Orthopaedic Surgery [facility]

## 2021-03-26 NOTE — H&P (Signed)
ORTHOPAEDIC SURGERY H&P  Subjective:  The patient presents with left ankle fracture sustained on 03/20/21 when she tripped over dog gate. She was closed reduced in ED and splinted. Followed up as outpatient and scheduled for surgery. Of note, she has had a remote left ankle injury several years ago following which she had extensive RSD for which she goes to pain management.   Past Medical History:  Diagnosis Date   Allergy    Asthma    in the past   Chronic pain    Depression    Gallstones    GERD (gastroesophageal reflux disease)    Headache    stopped after menopause   Neuromuscular disorder ([place])    Urticaria     Past Surgical History:  Procedure Laterality Date   APPENDECTOMY     BREAST REDUCTION SURGERY     BREAST SURGERY     CHOLECYSTECTOMY     NASAL SEPTUM SURGERY       (Not in an outpatient encounter)    Allergies  Allergen Reactions   Shellfish Allergy Anaphylaxis, Hives, Itching, Shortness Of Breath and Swelling   Shellfish-Derived Products     Social History   Socioeconomic History   Marital status: Married    Spouse name: Not on file   Number of children: Not on file   Years of education: Not on file   Highest education level: Not on file  Occupational History   Not on file  Tobacco Use   Smoking status: Former    Packs/day: 1.00    Years: 15.00    Pack years: 15.00    Types: Cigarettes    Quit date: 01/2004    Years since quitting: 17.1   Smokeless tobacco: Never  Vaping Use   Vaping Use: Never used  Substance and Sexual Activity   Alcohol use: Not Currently   Drug use: No   Sexual activity: Yes  Other Topics Concern   Not on file  Social History Narrative   Not on file   Social Determinants of Health   Financial Resource Strain: Not on file  Food Insecurity: Not on file  Transportation Needs: Not on file  Physical Activity: Not on file  Stress: Not on file  Social Connections: Not on file  Intimate Partner Violence: Not on  file     History reviewed. No pertinent family history.   Review of Systems Pertinent items are noted in HPI.  Objective: Vital signs in last 24 hours: Vitals with BMI 03/20/2021 03/20/2021 03/20/2021  Height - - -  Weight - - -  BMI - - -  Systolic - 263 785  Diastolic - 71 74  Pulse 885 113 112      EXAM: General: Well nourished, well developed. Awake, alert and oriented to time, place, person. Normal mood and affect. No apparent distress. Breathing room air.  Operative Lower Extremity: Alignment - Neutral Deformity - None Skin intact Tenderness to palpation - left ankle Normal range of motion 5/5 TA, PT, GS, Per, EHL, FHL Sensation intact to light touch throughout Palpable DP and PT pulses Special testing: None The contralateral foot/ankle was examined for comparison and noted to be neurovascularly intact with no localized deformity, swelling, or tenderness.  Imaging Review Nonweightbearing x-rays and CT of the left ankle taken were independently reviewed by me demonstrating trimalleolar ankle fracture.  Assessment/Plan: The clinical and radiographic findings were reviewed and discussed at length with the patient.  The patient has left trimalleolar ankle fracture.  We spoke at length about the natural course of these findings. We discussed nonoperative and operative treatment options in detail.  I have recommended the following: Surgery in the form of left ankle ORIF, possible syndesmosis fixation. The risks and benefits were presented and reviewed. The risks due to hardware failure/irritation, infection, stiffness, nerve/vessel/tendon injury, chronic pain/worsening of RSD, nonunion/malunion, wound healing issues, failure of this surgery, need for further surgery, thromboembolic events, amputation, death among others were discussed. The patient acknowledged the explanation, agreed to proceed with the plan and a consent was signed.  Armond Hang  Orthopaedic  Surgery [facility]

## 2021-03-26 NOTE — Transfer of Care (Signed)
Immediate Anesthesia Transfer of Care Note  Patient: Theresa Moran  Procedure(s) Performed: OPEN REDUCTION INTERNAL FIXATION (ORIF) ANKLE FRACTURE (Left: Ankle)  Patient Location: PACU  Anesthesia Type:GA combined with regional for post-op pain  Level of Consciousness: awake, drowsy and patient cooperative  Airway & Oxygen Therapy: Patient Spontanous Breathing and Patient connected to face mask oxygen  Post-op Assessment: Report given to RN and Post -op Vital signs reviewed and stable  Post vital signs: Reviewed and stable  Last Vitals:  Vitals Value Taken Time  BP 98/67 03/26/21 1745  Temp 36.7 C 03/26/21 1745  Pulse 107 03/26/21 1749  Resp 9 03/26/21 1749  SpO2 93 % 03/26/21 1749  Vitals shown include unvalidated device data.  Last Pain:  Vitals:   03/26/21 1344  TempSrc:   PainSc: 0-No pain         Complications: No notable events documented.

## 2021-03-26 NOTE — Anesthesia Procedure Notes (Signed)
Procedure Name: LMA Insertion Date/Time: 03/26/2021 3:24 PM Performed by: Rande Brunt, CRNA Pre-anesthesia Checklist: Patient identified, Emergency Drugs available, Suction available and Patient being monitored Patient Re-evaluated:Patient Re-evaluated prior to induction Oxygen Delivery Method: Circle System Utilized Preoxygenation: Pre-oxygenation with 100% oxygen Induction Type: IV induction LMA: LMA inserted LMA Size: 4.0 Number of attempts: 1 Placement Confirmation: positive ETCO2, CO2 detector and breath sounds checked- equal and bilateral Tube secured with: Tape Dental Injury: Teeth and Oropharynx as per pre-operative assessment  Comments: Placed by Tonna Corner

## 2021-03-26 NOTE — Anesthesia Procedure Notes (Signed)
Anesthesia Regional Block: Adductor canal block   Pre-Anesthetic Checklist: , timeout performed,  Correct Patient, Correct Site, Correct Laterality,  Correct Procedure, Correct Position, site marked,  Risks and benefits discussed,  Surgical consent,  Pre-op evaluation,  At surgeon's request and post-op pain management  Laterality: Lower and Left  Prep: chloraprep       Needles:  Injection technique: Single-shot  Needle Type: Stimiplex     Needle Length: 9cm  Needle Gauge: 21     Additional Needles:   Procedures:,,,, ultrasound used (permanent image in chart),,    Narrative:  Start time: 03/26/2021 2:45 PM End time: 03/26/2021 2:59 PM Injection made incrementally with aspirations every 5 mL.  Performed by: Personally  Anesthesiologist: Nolon Nations, MD  Additional Notes: BP cuff, EKG monitors applied. Sedation begun. Artery and nerve location verified with ultrasound. Anesthetic injected incrementally (5ml), slowly, and after negative aspirations under direct u/s guidance. Good fascial/perineural spread. Tolerated well.

## 2021-03-26 NOTE — H&P (Signed)
H&P Update:  -History and Physical Reviewed  -Patient has been re-examined  -No change in the plan of care  -The risks and benefits were presented and reviewed. The risks due to hardware failure/irritation, infection, stiffness, nerve/vessel/tendon injury, nonunion/malunion, wound healing issues, development of arthritis, failure of this surgery, possibility of external fixation with delayed definitive surgery, need for further surgery, thromboembolic events, anesthesia/hospital related complications, amputation, death among others were discussed. The patient acknowledged the explanation, agreed to proceed with the plan and a consent was signed.  Armond Hang

## 2021-03-27 NOTE — Progress Notes (Addendum)
Subjective: 1 Day Post-Op Procedure(s) (LRB): OPEN REDUCTION INTERNAL FIXATION (ORIF) ANKLE FRACTURE (Left)  Patient doing well POD1. Had dinner and breakfast. Pain is well controlled. Accompanied by her wife at bedside. She has worked with PT this morning to get out of bed.  Objective:   VITALS:  Temp:  [97.8 F (36.6 C)-98.4 F (36.9 C)] 97.8 F (36.6 C) (11/03 1558) Pulse Rate:  [98-105] 100 (11/03 1558) Resp:  [16-20] 20 (11/03 1558) BP: (121-131)/(59-79) 121/59 (11/03 1558) SpO2:  [91 %-95 %] 95 % (11/03 1118) Weight:  [702 kg] 125 kg (11/03 1558)  Gen: AAOx3, NAD  Left lower extremity: Well padded short leg splint in place Wiggles toes SILT over toes CR<2s    LABS Recent Labs    03/26/21 1324  HGB 14.9  WBC 8.2  PLT 128*   Recent Labs    03/26/21 1324  NA 136  K 3.9  CL 104  CO2 24  BUN 12  CREATININE 0.80  GLUCOSE 92   No results for input(s): LABPT, INR in the last 72 hours.   Assessment/Plan: 1 Day Post-Op Procedure(s) (LRB): OPEN REDUCTION INTERNAL FIXATION (ORIF) ANKLE FRACTURE (Left)  -strict NWB operative extremity, maximum elevation -maintain short leg splint until follow up -pain management to be consulted for discharge pain med recs -DVT Ppx: Aspirin 325 mg once daily x 30 days -physical therapy to assist with NWB precautions and mobilization -case management referral placed to assist with establishment of [place] -sutures out in 2-3 weeks with exchange of short leg splint to short leg cast in outpatient office  Armond Hang 03/27/2021, 7:56 PM

## 2021-03-27 NOTE — Evaluation (Signed)
Physical Therapy Evaluation Patient Details Name: Theresa Moran MRN: [id number] DOB: Nov 04, 1960 Today's Date: 03/27/2021  History of Present Illness  Pt admitted 11/2 for surgery after she had left ankle fracture sustained on 03/20/21 when she tripped over dog gate. ORIF left ankle on 11/2.  PMH: asthma, depression  Clinical Impression  Pt admitted with above diagnosis. Pt needed 2 persons to pivot to chair with RW and assist to maintain NWB left LE.  Pt told this PT that she wanted to go to SNF for therapy but later changed her mind.  She did tell this PT she has an Transport planner for long distances.  Unsure if this can be used in the home. If it cant be used in the home, she would need a wheelchair to go home. She will need assist at home as well and would be safest to scoot to wheelchair intiailly.   Pt currently with functional limitations due to the deficits listed below (see PT Problem List). Pt will benefit from skilled PT to increase their independence and safety with mobility to allow discharge to the venue listed below.          Recommendations for follow up therapy are one component of a multi-disciplinary discharge planning process, led by the attending physician.  Recommendations may be updated based on patient status, additional functional criteria and insurance authorization.  Follow Up Recommendations Skilled nursing-short term rehab (<3 hours/day) (discussed SNF with pt but she later changed mind to [place] with family support,  HHOT and HHAide recommended as well.)    Assistance Recommended at Discharge Frequent or constant Supervision/Assistance  Functional Status Assessment Patient has had a recent decline in their functional status and demonstrates the ability to make significant improvements in function in a reasonable and predictable amount of time.  Equipment Recommendations  Bariatric Rolling walker (2 wheels);bariatric 3in1 (PT);Wheelchair (20x18 chair with desk arms that  remove, anti tippers and elevating legrests. Pressure relieving wheelchair cushion 20x18)   Recommendations for Other Services       Precautions / Restrictions Precautions Precautions: Fall Restrictions Weight Bearing Restrictions: Yes LLE Weight Bearing: Non weight bearing      Mobility  Bed Mobility Overal bed mobility: Needs Assistance Bed Mobility: Supine to Sit     Supine to sit: Min guard     General bed mobility comments: Able to come to EOB on her own. Incr time as c/o left LE being numb    Transfers Overall transfer level: Needs assistance Equipment used: Rolling walker (2 wheels) Transfers: Sit to/from [employer] Transfers Sit to Stand: Min assist;Mod assist;+2 physical assistance;From elevated surface Stand pivot transfers: Min assist;+2 safety/equipment         General transfer comment: Pt needed mod assist to stand from lower surfaces and assist to steady once on foot.  Pt needing assist to maintain NWB.  Stood and pivoted but scooting foot more than pivoting. Pt to 3N1 and urinated. Then same assist to the recliner.    Ambulation/Gait                Stairs            Wheelchair Mobility    Modified Rankin (Stroke Patients Only)       Balance Overall balance assessment: Needs assistance Sitting-balance support: No upper extremity supported;Feet supported Sitting balance-Leahy Scale: Good     Standing balance support: Bilateral upper extremity supported;During functional activity;Reliant on assistive device for balance Standing balance-Leahy Scale: Poor Standing balance comment:  assist to maitnain weight bearing                             Pertinent Vitals/Pain Faces Pain Scale: Hurts even more Pain Location: left ankle Pain Descriptors / Indicators: Aching;Grimacing;Guarding    Home Living Family/patient expects to be discharged to:: Inpatient rehab Living Arrangements: Spouse/significant other Available  Help at Discharge: Available PRN/intermittently;Family (wife and daughter) Type of Home: House Home Access: Stairs to enter Entrance Stairs-Rails: None Entrance Stairs-Number of Steps: 1 + 1   Home Layout: Two level;Able to live on main level with bedroom/bathroom Home Equipment: Electric scooter;Cane - single point;Hand held shower head Additional Comments: Takes care of 4 grandchildren    Prior Function Prior Level of Function : Driving;Independent/Modified Independent       Physical Assist : Mobility (physical) Mobility (physical): Stairs   Mobility Comments: used electric scooter long distance ADLs Comments: I with ADLS     Hand Dominance   Dominant Hand: Right    Extremity/Trunk Assessment   Upper Extremity Assessment Upper Extremity Assessment: Defer to OT evaluation    Lower Extremity Assessment Lower Extremity Assessment: LLE deficits/detail LLE: Unable to fully assess due to pain;Unable to fully assess due to immobilization    Cervical / Trunk Assessment Cervical / Trunk Assessment: Kyphotic  Communication   Communication: No difficulties  Cognition Arousal/Alertness: Awake/alert Behavior During Therapy: WFL for tasks assessed/performed Overall Cognitive Status: Within Functional Limits for tasks assessed                                          General Comments      Exercises General Exercises - Lower Extremity Ankle Circles/Pumps: AROM;Right;10 reps;Seated Heel Slides: AROM;Both;10 reps;Supine   Assessment/Plan    PT Assessment Patient needs continued PT services  PT Problem List Decreased activity tolerance;Decreased range of motion;Decreased strength;Decreased balance;Decreased mobility;Decreased knowledge of use of DME;Decreased safety awareness;Decreased knowledge of precautions;Pain;Obesity       PT Treatment Interventions DME instruction;Gait training;Stair training;Functional mobility training;Therapeutic  activities;Therapeutic exercise;Balance training;Patient/family education    PT Goals (Current goals can be found in the Care Plan section)  Acute Rehab PT Goals Patient Stated Goal: to go home PT Goal Formulation: With patient Time For Goal Achievement: 04/10/21 Potential to Achieve Goals: Fair    Frequency Min 5X/week   Barriers to discharge        Co-evaluation               AM-PAC PT "6 Clicks" Mobility  Outcome Measure Help needed turning from your back to your side while in a flat bed without using bedrails?: None Help needed moving from lying on your back to sitting on the side of a flat bed without using bedrails?: None Help needed moving to and from a bed to a chair (including a wheelchair)?: Total Help needed standing up from a chair using your arms (e.g., wheelchair or bedside chair)?: Total Help needed to walk in hospital room?: Total Help needed climbing 3-5 steps with a railing? : Total 6 Click Score: 12    End of Session Equipment Utilized During Treatment: Gait belt Activity Tolerance: Patient limited by fatigue Patient left: in chair;with call bell/phone within reach;with chair alarm set Nurse Communication: Mobility status PT Visit Diagnosis: Unsteadiness on feet (R26.81);Muscle weakness (generalized) (M62.81);Pain Pain - Right/Left: Left Pain - part  of body: Ankle and joints of foot    Time:  - 4465-2076     Charges:  Mod evaluation, TA and Exercise            Harlo Jaso M,PT Acute Rehab Services [phone] (pager)   Alvira Philips 03/27/2021, 2:27 PM

## 2021-03-27 NOTE — Anesthesia Postprocedure Evaluation (Signed)
Anesthesia Post Note  Patient: Theresa Moran  Procedure(s) Performed: OPEN REDUCTION INTERNAL FIXATION (ORIF) ANKLE FRACTURE (Left: Ankle)     Patient location during evaluation: PACU Anesthesia Type: General Level of consciousness: sedated and patient cooperative Pain management: pain level controlled Vital Signs Assessment: post-procedure vital signs reviewed and stable Respiratory status: spontaneous breathing Cardiovascular status: stable Anesthetic complications: no   No notable events documented.  Last Vitals:  Vitals:   03/27/21 0551 03/27/21 0754  BP: 125/79 131/63  Pulse: 99 98  Resp: 16 19  Temp: 36.8 C 36.6 C  SpO2: 91% 93%    Last Pain:  Vitals:   03/27/21 0754  TempSrc: Oral  PainSc:                  Nolon Nations

## 2021-03-27 NOTE — TOC Initial Note (Addendum)
Transition of Care [facility]) - Initial/Assessment Note    Patient Details  Name: Theresa Moran MRN: [id number] Date of Birth: May 03, 1961  Transition of [facility]) CM/SW Contact:    Sharin Mons, RN Phone Number: 03/27/2021, 2:40 PM  Clinical Narrative:            -  s/p ORIF left ankle on 11/2  NCM spoke with pt @ bedside regarding d/c planning. From home with spouse. PTA states independent with ADL's. Pt stated initially wanted SNF placement however has changed her mind and wants to d/c to home with home health services. States has supportive family that will provide the assistance and supervision needed @ d/c. Agreeable to home health services. Choice provided . Pt without preference. Referral made with [facility] and accepted. DME needs: rolling walker, 3 in1/BSC and W/C noted. Referral made with [facility]. Equipment will be delivered to bedside prior to d/c. Pt states has no problems affording Rx meds.  Pt with transportation to home once medically ready.  TOC  team will continue monitor and assist with needs.....  Expected Discharge Plan: [place] Barriers to Discharge: Continued Medical Work up   Patient Goals and CMS Choice     Choice offered to / list presented to : Patient  Expected Discharge Plan and Services Expected Discharge Plan: [place]   Discharge Planning Services: CM Consult                     DME Arranged: 3-N-1, Walker rolling DME Agency: [facility] Date DME Agency Contacted: 03/27/21 Time DME Agency Contacted: [phone] Representative spoke with at DME Agency: [place] Arranged: PT, OT, Nurse's Aide, Social Work [employer] Agency: [place] Date [place]: 03/27/21 Time [place]: 72 Representative spoke with at [place]: [place] Arrangements/Services   Lives with:: Spouse Patient language and need for interpreter reviewed:: Yes Do you feel safe going back  to the place where you live?: Yes      Need for Family Participation in Patient Care: Yes (Comment) Care giver support system in place?: Yes (comment)   Criminal Activity/Legal Involvement Pertinent to Current Situation/Hospitalization: No - Comment as needed  Activities of Daily Living Home Assistive Devices/Equipment: Eyeglasses, Cane (specify quad or straight), Walker (specify type) (power scooter and a knee scooter) ADL Screening (condition at time of admission) Patient's cognitive ability adequate to safely complete daily activities?: Yes Is the patient deaf or have difficulty hearing?: No Does the patient have difficulty seeing, even when wearing glasses/contacts?: No Does the patient have difficulty concentrating, remembering, or making decisions?: No Patient able to express need for assistance with ADLs?: Yes Does the patient have difficulty dressing or bathing?: No Independently performs ADLs?: Yes (appropriate for developmental age) Does the patient have difficulty walking or climbing stairs?: Yes Weakness of Legs: Both Weakness of Arms/Hands: None  Permission Sought/Granted   Permission granted to share information with : Yes, Verbal Permission Granted  Share Information with NAME: Faatimah Spielberg (Spouse)    [phone]           Emotional Assessment Appearance:: Appears stated age Attitude/Demeanor/Rapport: Engaged Affect (typically observed): Accepting Orientation: : Oriented to Self, Oriented to Place, Oriented to  Time, Oriented to Situation Alcohol / Substance Use: Not Applicable Psych Involvement: No (comment)  Admission diagnosis:  Postoperative state [Z98.890] Patient Active Problem List   Diagnosis Date Noted   Postoperative state 03/26/2021  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 [place] [address] Phone: [phone] Fax: [place], [place] [address] Phone: [phone] Fax: [phone]     Social Determinants of Health (SDOH) Interventions    Readmission Risk Interventions No flowsheet data found.

## 2021-03-27 NOTE — Plan of Care (Signed)

## 2021-03-28 ENCOUNTER — Encounter (HOSPITAL_COMMUNITY): Payer: Self-pay | Admitting: Orthopaedic Surgery

## 2021-03-28 NOTE — Progress Notes (Signed)
Pt stayed sitting up in the chair after PT today. Pain is controlled with oral narcotics. Left short leg splint dry and intact. Left toes warm and mobile. Discharge instructions given to pt, discharged to home accompanied by spouse.

## 2021-03-28 NOTE — Progress Notes (Signed)
Subjective: 2 Days Post-Op Procedure(s) (LRB): OPEN REDUCTION INTERNAL FIXATION (ORIF) ANKLE FRACTURE (Left)  Patient doing well POD2. Pain is well controlled - block has now worn off. Accompanied by PT at bedside.   Objective:   VITALS:  Temp:  [97.6 F (36.4 C)-98.6 F (37 C)] 97.7 F (36.5 C) (11/04 1121) Pulse Rate:  [89-108] 100 (11/04 1121) Resp:  [16-20] 20 (11/04 1121) BP: (108-143)/(56-83) 118/62 (11/04 1121) SpO2:  [94 %-100 %] 95 % (11/04 1121) Weight:  [364 kg] 125 kg (11/03 1558)  Gen: AAOx3, NAD  Left lower extremity: Well padded short leg splint in place Wiggles toes SILT over toes CR<2s    LABS Recent Labs    03/26/21 1324  HGB 14.9  WBC 8.2  PLT 128*   Recent Labs    03/26/21 1324  NA 136  K 3.9  CL 104  CO2 24  BUN 12  CREATININE 0.80  GLUCOSE 92   No results for input(s): LABPT, INR in the last 72 hours.   Assessment/Plan: 2 Days Post-Op Procedure(s) (LRB): OPEN REDUCTION INTERNAL FIXATION (ORIF) ANKLE FRACTURE (Left)  -strict NWB operative extremity, maximum elevation -maintain short leg splint until follow up -DVT Ppx: Aspirin 325 mg once daily x 30 days -physical therapy to assist with NWB precautions and mobilization -case management referral placed to assist with establishment of [place] -sutures out in 2-3 weeks with exchange of short leg splint to short leg cast in outpatient office -discharge home today  Armond Hang 03/28/2021, 12:45 PM

## 2021-03-28 NOTE — Progress Notes (Signed)
Physical Therapy Treatment Patient Details Name: Theresa Moran MRN: [id number] DOB: February 09, 1961 Today's Date: 03/28/2021   History of Present Illness Pt admitted 11/2 for surgery after she had left ankle fracture sustained on 03/20/21 when she tripped over dog gate. ORIF left ankle on 11/2.  PMH: asthma, depression    PT Comments    Pt admitted with above diagnosis. Pt was able to ambulate a few steps with RW with good stability with min assist. Pt will have min assist at home intermittently.  Also practiced transfers to drop arm recliner with pt able to complete transfer with min guard assist and cues only.  Pt with good technique. Discussed to pplace the 3n1 on one side and perform an anterior posterior to it when needed if she would like to make it easier on herself when alone. Then she can exit bed from the other side to wheelchair. Pt likes this idea. Discussed steps however pt states that they can use a portable ramp that they used previously for each step.  She also states they are getting her a lift chair. All education for home complete and HHPT can address any other concerns.  Pt reports her equipment was delivered yesterday and she is happy with it.  She also knows that she will need to work on her right LE strength prior to using the knee walker.   Pt currently with functional limitations due to balance and endurance deficits. Pt will benefit from skilled PT to increase their independence and safety with mobility to allow discharge to the venue listed below.      Recommendations for follow up therapy are one component of a multi-disciplinary discharge planning process, led by the attending physician.  Recommendations may be updated based on patient status, additional functional criteria and insurance authorization.  Follow Up Recommendations  Home health PT     Assistance Recommended at Discharge Frequent or constant Supervision/Assistance  Equipment Recommendations  Rolling walker (2  wheels);3in1 (PT);Wheelchair (measurements PT);Wheelchair cushion (measurements PT)    Recommendations for Other Services       Precautions / Restrictions Precautions Precautions: Fall Required Braces or Orthoses: Splint/Cast Splint/Cast: short leg splint Restrictions Weight Bearing Restrictions: Yes LLE Weight Bearing: Non weight bearing     Mobility  Bed Mobility Overal bed mobility: Needs Assistance Bed Mobility: Supine to Sit;Sit to Supine     Supine to sit: Supervision Sit to supine: Supervision   General bed mobility comments: Able to come to EOB on her own. Incr time    Transfers Overall transfer level: Needs assistance Equipment used: Rolling walker (2 wheels) Transfers: Sit to/from [employer] Sit to Stand: Min assist Stand pivot transfers: Min assist        Lateral/Scoot Transfers: Min guard General transfer comment: Pt able to scoot to drop arm recliner to her right and then back to bed to her left with min guard assist and cues only.  safe technique. Showed pt how to use knee walker and attempted to stand to knee walker but pt could not demonstrate technique to the knee walker.  Stand to RW much better.  Pt needed min  assist to stand from lower surfaces and assist to steady once on foot.  Min guard assist from higher surfaces.  Pt not needing assist to maintain NWB today.  Stood and pivoted asnd was able to hop and pivot holding her left LE off floor.    Ambulation/Gait Ambulation/Gait assistance: Min assist;Min guard Gait Distance (Feet): 3 Feet  Assistive device: Rolling walker (2 wheels) Gait Pattern/deviations: Step-to pattern;Drifts right/left   Gait velocity interpretation: <1.31 ft/sec, indicative of household ambulator General Gait Details: Pt able to take several steps to chair today with RW and maintained NWB.   Stairs Stairs:  (Pt states she will use electric scooter on a portable ramp - they have hd to do this in the past.)            Wheelchair Mobility    Modified Rankin (Stroke Patients Only)       Balance Overall balance assessment: Needs assistance Sitting-balance support: No upper extremity supported Sitting balance-Leahy Scale: Good     Standing balance support: Bilateral upper extremity supported;Reliant on assistive device for balance;During functional activity Standing balance-Leahy Scale: Poor Standing balance comment: Can stand statically with RW but needs bil UE support and guard assist                            Cognition Arousal/Alertness: Awake/alert Behavior During Therapy: WFL for tasks assessed/performed Overall Cognitive Status: Within Functional Limits for tasks assessed                                          Exercises General Exercises - Lower Extremity Ankle Circles/Pumps: AROM;Right;10 reps;Seated Heel Slides: AROM;Both;10 reps;Supine    General Comments General comments (skin integrity, edema, etc.): Reports her family is getting her a lift chair      Pertinent Vitals/Pain Pain Assessment: Faces Pain Score: 7  Faces Pain Scale: Hurts even more Pain Location: left ankle Pain Descriptors / Indicators: Aching;Discomfort;Constant Pain Intervention(s): Limited activity within patient's tolerance;Monitored during session;Repositioned    Home Living Family/patient expects to be discharged to:: Private residence Living Arrangements: Spouse/significant other Available Help at Discharge: Available PRN/intermittently;Family Type of Home: House Home Access: Stairs to enter Entrance Stairs-Rails: None Entrance Stairs-Number of Steps: 1 + 1   Home Layout: Two level;Able to live on main level with bedroom/bathroom Home Equipment: Electric scooter;Cane - single point;Hand held shower head Additional Comments: caregiver for grandchildren    Prior Function            PT Goals (current goals can now be found in the care plan section)  Acute Rehab PT Goals Patient Stated Goal: to go home PT Goal Formulation: With patient Time For Goal Achievement: 04/10/21 Potential to Achieve Goals: Fair Progress towards PT goals: Progressing toward goals    Frequency    Min 5X/week      PT Plan Current plan remains appropriate    Co-evaluation              AM-PAC PT "6 Clicks" Mobility   Outcome Measure  Help needed turning from your back to your side while in a flat bed without using bedrails?: None Help needed moving from lying on your back to sitting on the side of a flat bed without using bedrails?: None Help needed moving to and from a bed to a chair (including a wheelchair)?: A Little Help needed standing up from a chair using your arms (e.g., wheelchair or bedside chair)?: A Little Help needed to walk in hospital room?: A Little Help needed climbing 3-5 steps with a railing? : Total 6 Click Score: 18    End of Session Equipment Utilized During Treatment: Gait belt Activity Tolerance: Patient limited by fatigue Patient left: in  chair;with call bell/phone within reach;with chair alarm set Nurse Communication: Mobility status PT Visit Diagnosis: Unsteadiness on feet (R26.81);Muscle weakness (generalized) (M62.81);Pain Pain - Right/Left: Left Pain - part of body: Ankle and joints of foot     Time: 9323-5573 PT Time Calculation (min) (ACUTE ONLY): 24 min  Charges:  $Therapeutic Exercise: 8-22 mins $Therapeutic Activity: 8-22 mins                     Gianluca Chhim M,PT Acute Rehab Services [phone] (pager)    Theresa Moran 03/28/2021, 1:41 PM

## 2021-03-28 NOTE — Evaluation (Signed)
Occupational Therapy Evaluation Patient Details Name: Theresa Moran MRN: [id number] DOB: [birth year] Today's Date: 03/28/2021   History of Present Illness Pt admitted 11/2 for surgery after she had left ankle fracture sustained on 03/20/21 when she tripped over dog gate. ORIF left ankle on 11/2.  PMH: asthma, depression   Clinical Impression   PTA, pt independent with ADLs and functional mobility. Lives at home with spouse, and is caregiver of 4 grandchildren. Pt pain is currently 7/10 in L ankle during session. Pt able to complete simulated BSC transfer leading with both affected and non-affected LE's, min A for both transfers using RW. Pt holding breath during transfers, encouraged to breathe, verbalizes difficulty with adhering to LE NWB precautions and requires increased time. Educated pt on compensatory strategies for toileting/pericare, as well as provided recommendations for tub/shower bench. Pt states spouse will "mostly" be at home to assist with ADLs, but has family member who lives nearby who can assist also. Educated pt on importance of keeping LLE elevated, pt verbalized understanding. Pt has no further acute OT needs, further needs will be addressed at next venue of care, recommend d/c home with [facility].     Recommendations for follow up therapy are one component of a multi-disciplinary discharge planning process, led by the attending physician.  Recommendations may be updated based on patient status, additional functional criteria and insurance authorization.   Follow Up Recommendations  Home health OT    Assistance Recommended at Discharge Intermittent Supervision/Assistance  Functional Status Assessment  Patient has had a recent decline in their functional status and demonstrates the ability to make significant improvements in function in a reasonable and predictable amount of time.  Equipment Recommendations  BSC;Tub/shower bench;Wheelchair cushion (measurements OT);Wheelchair  (measurements OT)    Recommendations for Other Services PT consult     Precautions / Restrictions Precautions Precautions: Fall Required Braces or Orthoses: Splint/Cast Splint/Cast: short leg splint Restrictions Weight Bearing Restrictions: Yes LLE Weight Bearing: Non weight bearing      Mobility Bed Mobility              Transfers Overall transfer level: Needs assistance Equipment used: Rolling walker (2 wheels) Transfers: Sit to/from Stand;Stand Pivot Transfers Sit to Stand: Min assist Stand pivot transfers: Min assist        General transfer comment: verbalizes difficulty adhering to NWB precaution during simulatd BSC transfer      Balance Overall balance assessment: Needs assistance Sitting-balance support: No upper extremity supported Sitting balance-Leahy Scale: Good     Standing balance support: Bilateral upper extremity supported;Reliant on assistive device for balance;During functional activity Standing balance-Leahy Scale: Poor Standing balance comment: Can stand statically with RW but needs bil UE support and guard assist                           ADL either performed or assessed with clinical judgement   ADL Overall ADL's : Needs assistance/impaired Eating/Feeding: Independent;Sitting   Grooming: Independent;Sitting   Upper Body Bathing: Minimal assistance;Sitting   Lower Body Bathing: Maximal assistance;Sitting/lateral leans Lower Body Bathing Details (indicate cue type and reason): pt notes family is available to help Upper Body Dressing : Minimal assistance;Sitting   Lower Body Dressing: Sitting/lateral leans;Maximal assistance Lower Body Dressing Details (indicate cue type and reason): pt notes family will help with this at home Toilet Transfer: Minimal assistance;Cueing for safety;Cueing for sequencing;BSC Toilet Transfer Details (indicate cue type and reason): simulated BSC transfer leading with affected and  non-affected  LE, pt able to complete with increased time Toileting- Clothing Manipulation and Hygiene: Moderate assistance;Minimal assistance;Sitting/lateral lean Toileting - Clothing Manipulation Details (indicate cue type and reason): educated pt on use of adaptive equipment toilet tongs for pericare if needed.     Functional mobility during ADLs: Minimal assistance General ADL Comments: pt often holding breath during session, required reminders to breathe     Vision   Vision Assessment?: No apparent visual deficits     Perception     Praxis      Pertinent Vitals/Pain Pain Assessment: Faces Pain Score: 7  Faces Pain Scale: Hurts even more Pain Location: left ankle Pain Descriptors / Indicators: Aching;Discomfort;Constant Pain Intervention(s): Limited activity within patient's tolerance;Premedicated before session;Utilized relaxation techniques;Monitored during session     Hand Dominance Right   Extremity/Trunk Assessment Upper Extremity Assessment Upper Extremity Assessment: Overall WFL for tasks assessed   Lower Extremity Assessment Lower Extremity Assessment: Defer to PT evaluation   Cervical / Trunk Assessment Cervical / Trunk Assessment: Kyphotic   Communication Communication Communication: No difficulties   Cognition Arousal/Alertness: Awake/alert Behavior During Therapy: WFL for tasks assessed/performed Overall Cognitive Status: Within Functional Limits for tasks assessed                                       General Comments  Reports her family is getting her a lift chair    Exercises Exercises: General Lower Extremity General Exercises - Lower Extremity Ankle Circles/Pumps: AROM;Right;10 reps;Seated Heel Slides: AROM;Both;10 reps;Supine   Shoulder Instructions      Home Living Family/patient expects to be discharged to:: Private residence Living Arrangements: Spouse/significant other Available Help at Discharge: Available  PRN/intermittently;Family Type of Home: House Home Access: Stairs to enter [employer] of Steps: 1 + 1 Entrance Stairs-Rails: None Home Layout: Two level;Able to live on main level with bedroom/bathroom     Bathroom Shower/Tub: Teacher, early years/pre: Standard Bathroom Accessibility: No   Home Equipment: Financial trader - single point;Hand held shower head   Additional Comments: caregiver for grandchildren      Prior Functioning/Environment Prior Level of Function : Driving;Independent/Modified Independent             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        Secretary/administrator Problem List: Decreased range of motion;Decreased activity tolerance;Impaired balance (sitting and/or standing);Pain      OT Treatment/Interventions:      OT Goals(Current goals can be found in the care plan section) Acute Rehab OT Goals Patient Stated Goal: return home OT Goal Formulation: With patient Time For Goal Achievement: 04/11/21 Potential to Achieve Goals: Good  OT Frequency:     Barriers to D/C:            Co-evaluation              AM-PAC OT "6 Clicks" Daily Activity     Outcome Measure Help from another person eating meals?: None Help from another person taking care of personal grooming?: None Help from another person toileting, which includes using toliet, bedpan, or urinal?: A Little Help from another person bathing (including washing, rinsing, drying)?: A Lot Help from another person to put on and taking off regular upper body clothing?: None Help from another person to put on and taking off regular lower body clothing?: A Lot 6 Click Score: 19   End of  Session Equipment Utilized During Treatment: Gait belt;Rolling walker (2 wheels) Nurse Communication: Mobility status  Activity Tolerance: Patient tolerated treatment well;Patient limited by pain;Other (comment) (limited by precautions) Patient left: in  chair;with chair alarm set;with call bell/phone within reach  OT Visit Diagnosis: Unsteadiness on feet (R26.81);Other abnormalities of gait and mobility (R26.89);Pain                Time: 5732-2025 OT Time Calculation (min): 28 min Charges:  OT General Charges $OT Visit: 1 Visit OT Evaluation $OT Eval Low Complexity: 1 Low OT Treatments $Self Care/Home Management : 8-22 mins  Lynnda Child, OTD, OTR/L Acute Rehab [phone]) [phone] - [place] 03/28/2021, 1:34 PM

## 2021-03-28 NOTE — Discharge Summary (Signed)
Physician Discharge Summary  Patient ID: RAGUEL KOSLOSKI MRN: [id number] DOB/AGE: January 30, 1961 60 y.o.  Admit date: 03/26/2021 Discharge date: 03/28/2021  Admission Diagnoses: Left trimalleolar ankle fracture  Discharge Diagnoses:  Active Problems:   Postoperative state Left trimalleolar ankle fracture  Discharged Condition: good  Hospital Course: Uncomplicated  The patient presented with left trimalleolar ankle fracture requiring operative fixation.    We discussed the diagnosis, alternative treatment options, risks and benefits of the above surgical intervention, as well as alternative non-operative treatments. All questions/concerns were addressed and the patient/family demonstrated appropriate understanding of the diagnosis, the procedure, the postoperative course, and overall prognosis. The patient wished to proceed with surgical intervention and signed an informed surgical consent as such, in each others presence prior to surgery.   She underwent left ankle ORIF on 03/26/21 without any complications. She was admitted for observation to ensure adequate pain control. She also received nerve block by anesthesia. She discharged home POD2 with home health care after working with physical therapy. She had an uncomplicated admission.  Consults: None  Significant Diagnostic Studies: None  Treatments: surgery: Left ankle and syndesmosis ORIF  Discharge Exam: Blood pressure 118/62, pulse 100, temperature 97.7 F (36.5 C), temperature source Oral, resp. rate 20, height 5\' 6"  (1.676 m), weight 125 kg, SpO2 95 %.  Gen: AAOx3, NAD   Left lower extremity: Well padded short leg splint in place Wiggles toes SILT over toes CR<2s  Disposition: Discharge disposition: 06-Home-Health Care Svc       Discharge Instructions     Discharge patient   Complete by: As directed    Discharge disposition: 06-Home-Health Care Svc   Discharge patient date: 03/28/2021      Allergies as of 03/28/2021        Reactions   Shellfish Allergy Anaphylaxis, Hives, Itching, Shortness Of Breath, Swelling   Shellfish-derived Products         Medication List     TAKE these medications    aspirin 325 MG tablet Take 325 mg by mouth in the morning and at bedtime.   baclofen 20 MG tablet Commonly known as: LIORESAL Take 20 mg by mouth 2 (two) times daily.   busPIRone 10 MG tablet Commonly known as: BUSPAR Take 1 tablet (10 mg total) by mouth 2 (two) times daily.   gabapentin 600 MG tablet Commonly known as: NEURONTIN Take by mouth 2 (two) times daily.   nortriptyline 50 MG capsule Commonly known as: PAMELOR Take 100 mg by mouth at bedtime.   Nucynta ER 50 MG 12 hr tablet Generic drug: tapentadol Take 50 mg by mouth in the morning and at bedtime.   omeprazole 20 MG capsule Commonly known as: PRILOSEC Take 1 capsule (20 mg total) by mouth 2 (two) times daily before a meal.   predniSONE 20 MG tablet Commonly known as: DELTASONE 2 tabs po daily x 4 days               Durable Medical Equipment  (From admission, onward)           Start     Ordered   03/27/21 1519  For home use only DME lightweight manual wheelchair with seat cushion  Once       Comments: Patient suffers from ankle fx which impairs their ability to perform daily activities like bathing in the home.  A walker will not resolve  issue with performing activities of daily living. A wheelchair will allow patient to safely perform daily activities. Patient is  not able to propel themselves in the home using a standard weight wheelchair due to general weakness. Patient can self propel in the lightweight wheelchair. Length of need 12 months .  Wheelchair [phone] chair with desk arms that remove, anti tippers and elevating legrests. Pressure relieving wheelchair cushion 20x18)  Accessories: elevating leg rests (ELRs), wheel locks, extensions and anti-tippers.   03/27/21 1521   03/27/21 1431  For home use only DME  3 n 1  Once       Comments: Bariatric   03/27/21 1432   03/27/21 1431  For home use only DME Walker rolling  Once       Comments: bariatric  Question Answer Comment  Walker: With [place]   Patient needs a walker to treat with the following condition Fx      03/27/21 1432            Follow-up Information     [facility] Follow up.   Specialty: [place] Why: home health services will be provided by [facility], start of care within 48 hours post discharge Contact information: [place] [address] [phone]                 Signed: Armond Hang 03/28/2021, 6:47 PM

## 2021-05-25 DIAGNOSIS — R600 Localized edema: Secondary | ICD-10-CM

## 2021-05-25 DIAGNOSIS — N950 Postmenopausal bleeding: Secondary | ICD-10-CM

## 2021-05-25 HISTORY — DX: Localized edema: R60.0

## 2021-05-25 HISTORY — DX: Postmenopausal bleeding: N95.0

## 2021-08-04 ENCOUNTER — Ambulatory Visit (INDEPENDENT_AMBULATORY_CARE_PROVIDER_SITE_OTHER): Payer: Medicare Other | Admitting: Family Medicine

## 2021-08-04 ENCOUNTER — Encounter: Payer: Self-pay | Admitting: Family Medicine

## 2021-08-04 VITALS — BP 130/80 | HR 103 | Temp 98.0°F | Ht 65.0 in | Wt 308.1 lb

## 2021-08-04 DIAGNOSIS — G90522 Complex regional pain syndrome I of left lower limb: Secondary | ICD-10-CM

## 2021-08-04 DIAGNOSIS — Z114 Encounter for screening for human immunodeficiency virus [HIV]: Secondary | ICD-10-CM

## 2021-08-04 DIAGNOSIS — Z13220 Encounter for screening for lipoid disorders: Secondary | ICD-10-CM

## 2021-08-04 DIAGNOSIS — Z1211 Encounter for screening for malignant neoplasm of colon: Secondary | ICD-10-CM

## 2021-08-04 DIAGNOSIS — D696 Thrombocytopenia, unspecified: Secondary | ICD-10-CM

## 2021-08-04 DIAGNOSIS — Z79899 Other long term (current) drug therapy: Secondary | ICD-10-CM

## 2021-08-04 DIAGNOSIS — F32A Depression, unspecified: Secondary | ICD-10-CM

## 2021-08-04 LAB — CBC WITH DIFFERENTIAL/PLATELET
Basophils Absolute: 0.1 10*3/uL (ref 0.0–0.1)
Basophils Relative: 1.3 % (ref 0.0–3.0)
Eosinophils Absolute: 0.2 10*3/uL (ref 0.0–0.7)
Eosinophils Relative: 3.1 % (ref 0.0–5.0)
HCT: 39 % (ref 36.0–46.0)
Hemoglobin: 13.2 g/dL (ref 12.0–15.0)
Lymphocytes Relative: 21.8 % (ref 12.0–46.0)
Lymphs Abs: 1.4 10*3/uL (ref 0.7–4.0)
MCHC: 33.8 g/dL (ref 30.0–36.0)
MCV: 94.4 fl (ref 78.0–100.0)
Monocytes Absolute: 0.5 10*3/uL (ref 0.1–1.0)
Monocytes Relative: 8.2 % (ref 3.0–12.0)
Neutro Abs: 4.1 10*3/uL (ref 1.4–7.7)
Neutrophils Relative %: 65.6 % (ref 43.0–77.0)
Platelets: 100 10*3/uL — ABNORMAL LOW (ref 150.0–400.0)
RBC: 4.13 Mil/uL (ref 3.87–5.11)
RDW: 15.5 % (ref 11.5–15.5)
WBC: 6.2 10*3/uL (ref 4.0–10.5)

## 2021-08-04 LAB — COMPREHENSIVE METABOLIC PANEL
ALT: 27 U/L (ref 0–35)
AST: 57 U/L — ABNORMAL HIGH (ref 0–37)
Albumin: 3.6 g/dL (ref 3.5–5.2)
Alkaline Phosphatase: 189 U/L — ABNORMAL HIGH (ref 39–117)
BUN: 19 mg/dL (ref 6–23)
CO2: 27 mEq/L (ref 19–32)
Calcium: 9 mg/dL (ref 8.4–10.5)
Chloride: 106 mEq/L (ref 96–112)
Creatinine, Ser: 0.85 mg/dL (ref 0.40–1.20)
GFR: 73.16 mL/min (ref >60.00)
Glucose, Bld: 96 mg/dL (ref 70–99)
Potassium: 4.2 mEq/L (ref 3.5–5.1)
Sodium: 139 mEq/L (ref 135–145)
Total Bilirubin: 1.3 mg/dL — ABNORMAL HIGH (ref 0.2–1.2)
Total Protein: 7.4 g/dL (ref 6.0–8.3)

## 2021-08-04 LAB — LIPID PANEL
Cholesterol: 170 mg/dL (ref 0–200)
HDL: 49 mg/dL (ref >39.00)
LDL Cholesterol: 103 mg/dL — ABNORMAL HIGH (ref 0–99)
NonHDL: 121.29
Total CHOL/HDL Ratio: 3
Triglycerides: 93 mg/dL (ref 0.0–149.0)
VLDL: 18.6 mg/dL (ref 0.0–40.0)

## 2021-08-04 NOTE — Progress Notes (Signed)
? ?New Patient Office Visit ? ?Subjective:  ?Patient ID: Theresa Moran, female    DOB: 1961/04/10  Age: 61 y.o. MRN: [id number] ? ?CC:  ?Chief Complaint  ?Patient presents with  ? Establish Care  ? ? ?HPI ?Theresa Moran presents for new pt to establish.  Former PCP gone. ? GERD-resolved ?Anxiety-managed by psych.  Doing well on buspar. No SI ?Complex pain syndrome L lower leg mostly-managed by pain mgmt.  On gabapentin,nortriptyline,Tapentadol, baclofen, ibuprofen ?Trimalleolar fx L-had ORIF in Nov.  Seeing ortho ? ?Has custody of 4 grands-4 children(pt birthed one, 2 adopted, partner's partner had one) 7.5 x2 and 13 and 15. ? ? ?Past Medical History:  ?Diagnosis Date  ? Allergy   ? Anxiety   ? Asthma   ? in the past  ? Chronic pain   ? Depression   ? Gallstones   ? GERD (gastroesophageal reflux disease)   ? Headache   ? stopped after menopause  ? Neuromuscular disorder ([place])   ? Urticaria   ? ? ?Past Surgical History:  ?Procedure Laterality Date  ? APPENDECTOMY  1979  ? BREAST REDUCTION SURGERY    ? BREAST SURGERY  1980  ? breast reduction  ? CHOLECYSTECTOMY    ? NASAL SEPTUM SURGERY    ? ORIF ANKLE FRACTURE Left 03/26/2021  ? Procedure: OPEN REDUCTION INTERNAL FIXATION (ORIF) ANKLE FRACTURE;  Surgeon: Armond Hang, MD;  Location: [facility];  Service: Orthopedics;  Laterality: Left;  ? ? ?Family History  ?Problem Relation Age of Onset  ? Intellectual disability Mother   ? Hypertension Mother   ? Hyperlipidemia Mother   ? Depression Mother   ? Arthritis Mother   ? Mental illness Mother   ? Heart disease Father   ? Diabetes Father   ? Stroke Father   ? Heart disease Brother   ? COPD Brother   ? Mental illness Daughter   ? Learning disabilities Daughter   ? Diabetes Daughter   ? Allergic rhinitis Neg Hx   ? Angioedema Neg Hx   ? Asthma Neg Hx   ? Eczema Neg Hx   ? Immunodeficiency Neg Hx   ? Urticaria Neg Hx   ? ? ?Social History  ? ?Socioeconomic History  ? Marital status: Married  ?  Spouse name: Not on file  ? Number  of children: Not on file  ? Years of education: Not on file  ? Highest education level: Not on file  ?Occupational History  ? Not on file  ?Tobacco Use  ? Smoking status: Former  ?  Packs/day: 1.00  ?  Years: 15.00  ?  Pack years: 15.00  ?  Types: Cigarettes  ?  Quit date: 01/2004  ?  Years since quitting: 17.5  ? Smokeless tobacco: Never  ?Vaping Use  ? Vaping Use: Never used  ?Substance and Sexual Activity  ? Alcohol use: Not Currently  ? Drug use: No  ? Sexual activity: Not Currently  ?Other Topics Concern  ? Not on file  ?Social History Narrative  ? Not on file  ? ?Social Determinants of Health  ? ?Financial Resource Strain: Not on file  ?Food Insecurity: Not on file  ?Transportation Needs: Not on file  ?Physical Activity: Not on file  ?Stress: Not on file  ?Social Connections: Not on file  ?Intimate Partner Violence: Not on file  ? ? ?ROS  ?ROS: ?Gen: no fever, chills  ?Skin: no rash, itching ?ENT: no ear pain, ear drainage, nasal congestion,  rhinorrhea, sinus pressure, sore throat ?Eyes: no blurry vision, double vision ?Resp: no cough, wheeze,SOB.  Had some cough/wheeze last wk-gone now. ?Breast: no breast tenderness, no nipple discharge, no breast masses ?CV: no CP, palpitations, + LLE edema,  ?GI: no heartburn, n/v/d/c, abd pain.  Colon >33yr-normal per pt.   No FH colon ca ?GU: no dysuria, urgency, frequency, hematuria.   Thinks more recent pap than 2019-had female partner long time ago. ?MSK: HPI ?Neuro: no dizziness, headache, weakness, vertigo ?Psych: no depression, anxiety, insomnia, SI  ? ?Objective:  ? ?Today's Vitals: BP 130/80   Pulse (!) 103   Temp 98 ?F (36.7 ?C) (Temporal)   Ht '5\' 5"'$  (1.651 m)   Wt (!) 308 lb 2 oz (139.8 kg)   SpO2 99%   BMI 51.27 kg/m?  ? ?Physical Exam  ?Gen: WDWN NAD MOWF ?HEENT: NCAT, conjunctiva not injected, sclera nonicteric ?TM WNL B, OP moist, no exudates  ?NECK:  supple, no thyromegaly, no nodes, no carotid bruits ?CARDIAC: RRR, S1S2+, no murmur. DP 2+B ?LUNGS:  CTAB. No wheezes ?ABDOMEN:  BS+, soft, NTND, No HSM, no masses ?EXT:  no edema ?MSK: using Walker.  limps  ?NEURO: A&O x3.  CN II-XII intact.  ?PSYCH: normal mood. Good eye contact  ? ?Assessment & Plan:  ? ?Problem List Items Addressed This Visit   ? ?  ? Nervous and Auditory  ? Reflex sympathetic dystrophy of lower extremity - Primary  ? Relevant Medications  ? nortriptyline (PAMELOR) 50 MG capsule  ? ibuprofen (ADVIL) 600 MG tablet  ? baclofen (LIORESAL) 10 MG tablet  ?  ? Other  ? Depressive disorder  ? Relevant Medications  ? nortriptyline (PAMELOR) 50 MG capsule  ? ?Other Visit Diagnoses   ? ? Screen for colon cancer      ? Relevant Orders  ? Cologuard  ? High risk medication use      ? Relevant Orders  ? Cologuard  ? CBC with Differential/Platelet  ? Comprehensive metabolic panel  ? Screening cholesterol level      ? Relevant Orders  ? Lipid panel  ? ?  ? Depression/anx-well controlled on meds.  Managed by psych ?Complex pain syndrome-stable on meds per pain mgmt ?High risk meds, check cbc,cmp ?Screen colon ca-as still healing from ankle fx and a lot going on, will do cologard as low risk ?Pap next yr ? ?Outpatient Encounter Medications as of 08/04/2021  ?Medication Sig  ? baclofen (LIORESAL) 10 MG tablet Take 10 mg by mouth daily.  ? busPIRone (BUSPAR) 10 MG tablet Take 1 tablet (10 mg total) by mouth 2 (two) times daily.  ? gabapentin (NEURONTIN) 600 MG tablet Take by mouth 2 (two) times daily.  ? ibuprofen (ADVIL) 600 MG tablet Take 600 mg by mouth every 6 (six) hours as needed. 3 tablets daily  ? nortriptyline (PAMELOR) 50 MG capsule Take by mouth.  ? Tapentadol HCl 150 MG TB12 Take 150 mg by mouth daily.  ? ondansetron (ZOFRAN) 8 MG tablet Zofran 8 mg tablet ? Take 1 tablet every 8 hours by oral route as needed for nausea for 7 days. (Patient not taking: Reported on 08/04/2021)  ? [DISCONTINUED] aspirin 325 MG tablet Take 325 mg by mouth in the morning and at bedtime.  ? [DISCONTINUED] baclofen (LIORESAL)  20 MG tablet Take 20 mg by mouth 2 (two) times daily.  ? [DISCONTINUED] nortriptyline (PAMELOR) 50 MG capsule Take 100 mg by mouth at bedtime.  ? [DISCONTINUED] omeprazole (PRILOSEC)  20 MG capsule Take 1 capsule (20 mg total) by mouth 2 (two) times daily before a meal.  ? [DISCONTINUED] predniSONE (DELTASONE) 20 MG tablet 2 tabs po daily x 4 days (Patient not taking: Reported on 03/25/2021)  ? ?No facility-administered encounter medications on file as of 08/04/2021.  ? ? ?Follow-up: Return in about 1 year (around 08/05/2022).  ? ?Wellington Hampshire, MD ?

## 2021-08-04 NOTE — Patient Instructions (Signed)
Welcome to [facility]! It was a pleasure meeting you today. ? ?As discussed, Please schedule a 12 month follow up visit today. ? ?Check with pharmacy on shingrix(for shingles) ? ?PLEASE NOTE: ? ?If you had any LAB tests please let us know if you have not heard back within a few days. You may see your results on MyChart before we have a chance to review them but we will give you a call once they are reviewed by [place]. If we ordered any REFERRALS today, please let us know if you have not heard from their office within the next week.  ?Let us know through MyChart if you are needing REFILLS, or have your pharmacy send [place] the request. You can also use MyChart to communicate with me or any office staff. ? ?Please try these tips to maintain a healthy lifestyle: ? ?Eat most of your calories during the day when you are active. Eliminate processed foods including packaged sweets (pies, cakes, cookies), reduce intake of potatoes, white bread, white pasta, and white rice. Look for whole grain options, oat flour or almond flour. ? ?Each meal should contain half fruits/vegetables, one quarter protein, and one quarter carbs (no bigger than a computer mouse). ? ?Cut down on sweet beverages. This includes juice, soda, and sweet tea. Also watch fruit intake, though this is a healthier sweet option, it still contains natural sugar! Limit to 3 servings daily. ? ?Drink at least 1 glass of water with each meal and aim for at least 8 glasses per day ? ?Exercise at least 150 minutes every week.   ?

## 2021-08-06 NOTE — Addendum Note (Signed)
Addended by: Wellington Hampshire on: 08/06/2021 07:58 AM ? ? Modules accepted: Orders ? ?

## 2021-08-08 ENCOUNTER — Ambulatory Visit: Payer: Medicare Other | Admitting: Family Medicine

## 2021-08-20 ENCOUNTER — Other Ambulatory Visit (INDEPENDENT_AMBULATORY_CARE_PROVIDER_SITE_OTHER): Payer: Medicare Other

## 2021-08-20 DIAGNOSIS — D696 Thrombocytopenia, unspecified: Secondary | ICD-10-CM

## 2021-08-20 DIAGNOSIS — Z114 Encounter for screening for human immunodeficiency virus [HIV]: Secondary | ICD-10-CM

## 2021-08-20 LAB — CBC WITH DIFFERENTIAL/PLATELET
Basophils Absolute: 0.1 10*3/uL (ref 0.0–0.1)
Basophils Relative: 0.8 % (ref 0.0–3.0)
Eosinophils Absolute: 0.2 10*3/uL (ref 0.0–0.7)
Eosinophils Relative: 3.3 % (ref 0.0–5.0)
HCT: 35.9 % — ABNORMAL LOW (ref 36.0–46.0)
Hemoglobin: 12.1 g/dL (ref 12.0–15.0)
Lymphocytes Relative: 25.6 % (ref 12.0–46.0)
Lymphs Abs: 1.6 10*3/uL (ref 0.7–4.0)
MCHC: 33.6 g/dL (ref 30.0–36.0)
MCV: 96.1 fl (ref 78.0–100.0)
Monocytes Absolute: 0.3 10*3/uL (ref 0.1–1.0)
Monocytes Relative: 5.4 % (ref 3.0–12.0)
Neutro Abs: 4.1 10*3/uL (ref 1.4–7.7)
Neutrophils Relative %: 64.9 % (ref 43.0–77.0)
Platelets: 99 10*3/uL — ABNORMAL LOW (ref 150.0–400.0)
RBC: 3.73 Mil/uL — ABNORMAL LOW (ref 3.87–5.11)
RDW: 15.5 % (ref 11.5–15.5)
WBC: 6.3 10*3/uL (ref 4.0–10.5)

## 2021-08-20 LAB — FOLATE: Folate: 12.3 ng/mL (ref >5.9)

## 2021-08-20 LAB — VITAMIN B12: Vitamin B-12: 697 pg/mL (ref 211–911)

## 2021-08-21 LAB — HIV ANTIBODY (ROUTINE TESTING W REFLEX): HIV 1&2 Ab, 4th Generation: NONREACTIVE

## 2021-08-21 LAB — HEPATITIS C ANTIBODY
Hepatitis C Ab: NONREACTIVE
SIGNAL TO CUT-OFF: 0.15 (ref <1.00)

## 2021-08-22 ENCOUNTER — Other Ambulatory Visit: Payer: Self-pay | Admitting: *Deleted

## 2021-08-22 DIAGNOSIS — D696 Thrombocytopenia, unspecified: Secondary | ICD-10-CM

## 2021-08-22 NOTE — Progress Notes (Signed)
Amb to  ?

## 2021-08-23 DIAGNOSIS — J189 Pneumonia, unspecified organism: Secondary | ICD-10-CM

## 2021-08-23 DIAGNOSIS — Z8701 Personal history of pneumonia (recurrent): Secondary | ICD-10-CM

## 2021-08-23 HISTORY — DX: Personal history of pneumonia (recurrent): Z87.01

## 2021-08-23 HISTORY — DX: Pneumonia, unspecified organism: J18.9

## 2021-08-29 LAB — COLOGUARD: COLOGUARD: NEGATIVE

## 2021-09-02 ENCOUNTER — Encounter: Payer: Self-pay | Admitting: Family Medicine

## 2021-09-02 ENCOUNTER — Ambulatory Visit (INDEPENDENT_AMBULATORY_CARE_PROVIDER_SITE_OTHER): Payer: Medicare Other | Admitting: Family Medicine

## 2021-09-02 VITALS — BP 138/70 | HR 122 | Temp 98.1°F | Ht 65.0 in | Wt 310.4 lb

## 2021-09-02 DIAGNOSIS — R000 Tachycardia, unspecified: Secondary | ICD-10-CM

## 2021-09-02 DIAGNOSIS — J0100 Acute maxillary sinusitis, unspecified: Secondary | ICD-10-CM

## 2021-09-02 MED ORDER — AMOXICILLIN-POT CLAVULANATE 875-125 MG PO TABS: 1.0000 | ORAL_TABLET | Freq: Two times a day (BID) | ORAL | 0 refills | Status: DC

## 2021-09-02 NOTE — Progress Notes (Signed)
? ?Subjective:  ? ? ? Patient ID: Theresa Moran, female    DOB: [birth year], 61 y.o.   MRN: [id number] ? ?Chief Complaint  ?Patient presents with  ? blocked sinus  ?  Pt c/o having a blocked sinus that started 3 days ago.  ? ? ?HPI here w/wife ?Chief complaint: sinus ?Symptom onset: 1 mo ?Pertinent positives: 1 mo drooling at HS only.  Few days ago, lump/swelling  R cheek area.  This am, some blood in drool this am.    No nasal drainage.  No tooth pain. No eye d/c. ?Pertinent negatives: no f/c. No cough, n/v ?Treatments tried: none ? ?Heme on Friday ?  ? ?Health Maintenance Due  ?Topic Date Due  ? Zoster Vaccines- Shingrix (1 of 2) Never done  ? COVID-19 Vaccine (2 - Janssen risk series) 05/26/2020  ? COLONOSCOPY (Pts 45-43yr Insurance coverage will need to be confirmed)  09/29/2020  ? PAP SMEAR-Modifier  03/22/2021  ? ? ?Past Medical History:  ?Diagnosis Date  ? Allergy   ? Anxiety   ? Asthma   ? in the past  ? Chronic pain   ? Depression   ? Gallstones   ? GERD (gastroesophageal reflux disease)   ? Headache   ? stopped after menopause  ? Neuromuscular disorder ([place]   ? Urticaria   ? ? ?Past Surgical History:  ?Procedure Laterality Date  ? APPENDECTOMY  1979  ? BREAST REDUCTION SURGERY    ? BREAST SURGERY  1980  ? breast reduction  ? CHOLECYSTECTOMY    ? NASAL SEPTUM SURGERY    ? ORIF ANKLE FRACTURE Left 03/26/2021  ? Procedure: OPEN REDUCTION INTERNAL FIXATION (ORIF) ANKLE FRACTURE;  Surgeon: RArmond Hang MD;  Location: [facility]  Service: Orthopedics;  Laterality: Left;  ? ? ?Outpatient Medications Prior to Visit  ?Medication Sig Dispense Refill  ? baclofen (LIORESAL) 10 MG tablet Take 10 mg by mouth daily.    ? busPIRone (BUSPAR) 10 MG tablet Take 1 tablet (10 mg total) by mouth 2 (two) times daily. 60 tablet 0  ? gabapentin (NEURONTIN) 600 MG tablet Take by mouth 2 (two) times daily.    ? ibuprofen (ADVIL) 600 MG tablet Take 600 mg by mouth every 6 (six) hours as needed. 3 tablets daily    ? ondansetron  (ZOFRAN) 8 MG tablet     ? Tapentadol HCl 150 MG TB12 Take 150 mg by mouth daily.    ? ?No facility-administered medications prior to visit.  ? ? ?Allergies  ?Allergen Reactions  ? Shellfish Allergy Anaphylaxis, Hives, Itching, Shortness Of Breath and Swelling  ? Shellfish-Derived Products   ? ?ROS neg/noncontributory except as noted HPI/below ? ? ?   ?Objective:  ?  ? ?BP 138/70   Pulse (!) 122   Temp 98.1 ?F (36.7 ?C)   Ht '5\' 5"'$  (1.651 m)   Wt (!) 310 lb 6.4 oz (140.8 kg)   SpO2 98%   BMI 51.65 kg/m?  ?Wt Readings from Last 3 Encounters:  ?09/02/21 (!) 310 lb 6.4 oz (140.8 kg)  ?08/04/21 (!) 308 lb 2 oz (139.8 kg)  ?03/27/21 275 lb 9.2 oz (125 kg)  ? ? ?Physical Exam  ? ?Gen: WDWN NAD MOF ?HEENT: NCAT, conjunctiva not injected, sclera nonicteric ?TM WNL B, OP moist, no exudates .  No oral lesions seen nor palpated ?On R cheek, center near maxilla-tender, non fluctuant approx 1cm lump(linear).  Also, smaller one lateral/superior to it, but can't feel margins. ?NECK:  supple, no thyromegaly, no nodes, no carotid bruits ?CARDIAC: tachyRRR, S1S2+, 1/6 sem- murmur.  ?LUNGS: CTAB. No wheezes ?EXT:  tr edema ?MSK: no gross abnormalities.  ?NEURO: A&O x3.  CN II-XII intact.  ?PSYCH: normal mood. Good eye contact ? ?   ?Assessment & Plan:  ? ?Problem List Items Addressed This Visit   ?None ?Visit Diagnoses   ? ? Subacute maxillary sinusitis    -  Primary  ? Relevant Medications  ? amoxicillin-clavulanate (AUGMENTIN) 875-125 MG tablet  ? Tachycardia      ? ?  ? Sinusitis?, oral abscess? Other?   Will do augmentin, warm soaks.  Worse, no change, ENT ?Tachycardia-long time-no w/u ever done.  Refer card. ?Low plt-seeing heme in few days. ? ?Meds ordered this encounter  ?Medications  ? amoxicillin-clavulanate (AUGMENTIN) 875-125 MG tablet  ?  Sig: Take 1 tablet by mouth 2 (two) times daily.  ?  Dispense:  20 tablet  ?  Refill:  0  ? ? ?Wellington Hampshire, MD ? ?

## 2021-09-02 NOTE — Patient Instructions (Addendum)
It was very nice to see you today! ? ?Let me know if not improving ? ?For wt loss-wegovy or saxenda-check insurance ? ? ?PLEASE NOTE: ? ?If you had any lab tests please let us know if you have not heard back within a few days. You may see your results on MyChart before we have a chance to review them but we will give you a call once they are reviewed by [place]. If we ordered any referrals today, please let us know if you have not heard from their office within the next week.  ? ?Please try these tips to maintain a healthy lifestyle: ? ?Eat most of your calories during the day when you are active. Eliminate processed foods including packaged sweets (pies, cakes, cookies), reduce intake of potatoes, white bread, white pasta, and white rice. Look for whole grain options, oat flour or almond flour. ? ?Each meal should contain half fruits/vegetables, one quarter protein, and one quarter carbs (no bigger than a computer mouse). ? ?Cut down on sweet beverages. This includes juice, soda, and sweet tea. Also watch fruit intake, though this is a healthier sweet option, it still contains natural sugar! Limit to 3 servings daily. ? ?Drink at least 1 glass of water with each meal and aim for at least 8 glasses per day ? ?Exercise at least 150 minutes every week.   ?

## 2021-09-03 ENCOUNTER — Telehealth: Payer: Self-pay | Admitting: Hematology and Oncology

## 2021-09-03 NOTE — Telephone Encounter (Signed)
Attempted to contact patient to confirm new Hem appt for this Friday. No answer so voicemail was left to call back to confirm  ?

## 2021-09-05 ENCOUNTER — Other Ambulatory Visit: Payer: Self-pay

## 2021-09-05 ENCOUNTER — Inpatient Hospital Stay: Payer: Medicare Other | Attending: Hematology and Oncology | Admitting: Hematology and Oncology

## 2021-09-05 ENCOUNTER — Encounter: Payer: Self-pay | Admitting: Hematology and Oncology

## 2021-09-05 ENCOUNTER — Inpatient Hospital Stay: Payer: Medicare Other

## 2021-09-05 VITALS — BP 141/65 | HR 124 | Temp 97.5°F | Resp 18 | Ht 65.0 in | Wt 310.9 lb

## 2021-09-05 DIAGNOSIS — R000 Tachycardia, unspecified: Secondary | ICD-10-CM

## 2021-09-05 DIAGNOSIS — Z87891 Personal history of nicotine dependence: Secondary | ICD-10-CM | POA: Insufficient documentation

## 2021-09-05 DIAGNOSIS — K219 Gastro-esophageal reflux disease without esophagitis: Secondary | ICD-10-CM | POA: Insufficient documentation

## 2021-09-05 DIAGNOSIS — D589 Hereditary hemolytic anemia, unspecified: Secondary | ICD-10-CM | POA: Insufficient documentation

## 2021-09-05 DIAGNOSIS — Z79899 Other long term (current) drug therapy: Secondary | ICD-10-CM | POA: Diagnosis not present

## 2021-09-05 DIAGNOSIS — D696 Thrombocytopenia, unspecified: Secondary | ICD-10-CM

## 2021-09-05 DIAGNOSIS — R5383 Other fatigue: Secondary | ICD-10-CM | POA: Diagnosis not present

## 2021-09-05 DIAGNOSIS — I700 Atherosclerosis of aorta: Secondary | ICD-10-CM | POA: Insufficient documentation

## 2021-09-05 DIAGNOSIS — R161 Splenomegaly, not elsewhere classified: Secondary | ICD-10-CM | POA: Diagnosis not present

## 2021-09-05 LAB — FERRITIN: Ferritin: 72 ng/mL (ref 11–307)

## 2021-09-05 LAB — IRON AND IRON BINDING CAPACITY (CC-WL,HP ONLY)
Iron: 88 ug/dL (ref 28–170)
Saturation Ratios: 25 % (ref 10.4–31.8)
TIBC: 349 ug/dL (ref 250–450)
UIBC: 261 ug/dL (ref 148–442)

## 2021-09-05 LAB — RETICULOCYTES
Immature Retic Fract: 20.4 % — ABNORMAL HIGH (ref 2.3–15.9)
RBC.: 2.96 MIL/uL — ABNORMAL LOW (ref 3.87–5.11)
Retic Count, Absolute: 139.1 10*3/uL (ref 19.0–186.0)
Retic Ct Pct: 4.7 % — ABNORMAL HIGH (ref 0.4–3.1)

## 2021-09-05 LAB — CBC WITH DIFFERENTIAL/PLATELET
Abs Immature Granulocytes: 0.07 10*3/uL (ref 0.00–0.07)
Basophils Absolute: 0.1 10*3/uL (ref 0.0–0.1)
Basophils Relative: 1 %
Eosinophils Absolute: 0.2 10*3/uL (ref 0.0–0.5)
Eosinophils Relative: 3 %
HCT: 29.8 % — ABNORMAL LOW (ref 36.0–46.0)
Hemoglobin: 9.6 g/dL — ABNORMAL LOW (ref 12.0–15.0)
Immature Granulocytes: 1 %
Lymphocytes Relative: 31 %
Lymphs Abs: 2.2 10*3/uL (ref 0.7–4.0)
MCH: 31.3 pg (ref 26.0–34.0)
MCHC: 32.2 g/dL (ref 30.0–36.0)
MCV: 97.1 fL (ref 80.0–100.0)
Monocytes Absolute: 0.7 10*3/uL (ref 0.1–1.0)
Monocytes Relative: 9 %
Neutro Abs: 4 10*3/uL (ref 1.7–7.7)
Neutrophils Relative %: 55 %
Platelets: 118 10*3/uL — ABNORMAL LOW (ref 150–400)
RBC: 3.07 MIL/uL — ABNORMAL LOW (ref 3.87–5.11)
RDW: 15.3 % (ref 11.5–15.5)
WBC: 7.2 10*3/uL (ref 4.0–10.5)
nRBC: 0 % (ref 0.0–0.2)

## 2021-09-05 LAB — PROTIME-INR
INR: 1.4 — ABNORMAL HIGH (ref 0.8–1.2)
Prothrombin Time: 16.6 seconds — ABNORMAL HIGH (ref 11.4–15.2)

## 2021-09-05 LAB — LACTATE DEHYDROGENASE: LDH: 174 U/L (ref 98–192)

## 2021-09-05 LAB — TSH: TSH: 2.471 u[IU]/mL (ref 0.308–3.960)

## 2021-09-05 LAB — APTT: aPTT: 36 seconds (ref 24–36)

## 2021-09-05 NOTE — Progress Notes (Signed)
[facility] ?CONSULT NOTE ? ?Patient Care Team: ?Tawnya Crook, MD as PCP - General (Family Medicine) ? ?CHIEF COMPLAINTS/PURPOSE OF CONSULTATION:  ?Thrombocytopenia. ? ?ASSESSMENT & PLAN:  ?No problem-specific Assessment & Plan notes found for this encounter. ? ?Orders Placed This Encounter  ?Procedures  ? CBC with Differential/Platelet  ?  Standing Status:   Standing  ?  Number of Occurrences:   60  ?  Standing Expiration Date:   09/06/2022  ? Pathologist smear review  ?  Standing Status:   Future  ?  Number of Occurrences:   1  ?  Standing Expiration Date:   09/05/2022  ? Iron and Iron Binding Capacity ([facility] only)  ?  Standing Status:   Future  ?  Number of Occurrences:   1  ?  Standing Expiration Date:   09/06/2022  ? Ferritin  ?  Standing Status:   Future  ?  Number of Occurrences:   1  ?  Standing Expiration Date:   09/05/2022  ? Lactate dehydrogenase  ?  Standing Status:   Future  ?  Number of Occurrences:   1  ?  Standing Expiration Date:   09/06/2022  ? Reticulocytes  ?  Standing Status:   Future  ?  Number of Occurrences:   1  ?  Standing Expiration Date:   09/06/2022  ? Protime-INR  ?  Standing Status:   Future  ?  Number of Occurrences:   1  ?  Standing Expiration Date:   09/06/2022  ? TSH  ?  Standing Status:   Standing  ?  Number of Occurrences:   62  ?  Standing Expiration Date:   09/06/2022  ? APTT  ?  Standing Status:   Future  ?  Number of Occurrences:   1  ?  Standing Expiration Date:   09/06/2022  ? ?This is a pleasant 61 year old female patient with past medical history significant for neuropathy anxiety/depression referred to hematology for evaluation of progressive thrombocytopenia.  We have discussed the following details about thrombocytopenia ? ?Thrombocytopenia is defined as a platelet count below the lower limit of normal (ie, <150,000/microL [150 x 109/L] for adults).  ?Degrees of thrombocytopenia can be further subdivided into mild (platelet count 100,000 to  150,000/microL), moderate (50,000 to 99,000/microL), and severe (<50,000/microL) ?Most common causes of thrombocytopenia include but not limited to chronic liver disease or hypersplenism, immune thrombocytopenia, viral infections such as Hepatitis, HIV, active bacterial infections, autoimmune diseases, alcohol, nutritional deficiencies and medications. Rarely bone marrow disorders such as myelodysplatic syndrome, bone marrow failure syndromes, acute leukemia and PNH can present with thrombocytopenia. Additional rare causes of thrombocytopenia include vascular conditions associated with platelet destruction (eg, giant capillary hemangioma, large aortic aneurysms, cardiopulmonary bypass, intraaortic balloon pumps. ? ?At this time she appears to have mild thrombocytopenia.  No evidence of B12/folic acid deficiency.  No leukopenia or anemia although hemoglobin has also been slowly downtrending.  No evidence of cirrhosis, she does have elevated AST suggestive of possible transaminitis, questionably related to fatty liver, this has not been evaluated before. ?No recent infections or medication to explain the downtrending thrombocytopenia.  We have discussed that this could still be possibly ITP versus early myelodysplasia.  I have recommended a few additional labs today. ?She should return to clinic in 2 weeks for telehealth visit.  If labs do not show any clear evidence of the etiology, we could consider monitoring it since this is mild thrombocytopenia. ? ?Thank you for  consulting [place] in the care of this patient.  Please do not hesitate contact us with any additional questions or concerns ? ?HISTORY OF PRESENTING ILLNESS:  ?Theresa Moran 61 y.o. female is here because of thrombocytopenia ? ?This is a very pleasant 61 year old female patient with history of thrombocytopenia referred to hematology for evaluation and recommendations.  Patient was noted to have mild thrombocytopenia about 9 months ago which has slowly down  trended on repeat labs.  She is here with family. ?[name] denies any new health complaints except for a recent ankle fracture which appears to be a mechanical fall.  She has some limitation because of the recent ankle fracture.  Otherwise she denies any evidence of bleeding.  She had COVID-19 and flu last year around October/November 2023.  She denies any hospitalizations or infections prior to that.  No new medications in the past 1 year.  No known liver diseases.  She does not drink alcohol on a regular basis.  She barely drinks it twice a year. ?Rest of the pertinent 10 point ROS reviewed and negative ? ? ?MEDICAL HISTORY:  ?Past Medical History:  ?Diagnosis Date  ? Allergy   ? Anxiety   ? Asthma   ? in the past  ? Chronic pain   ? Depression   ? Gallstones   ? GERD (gastroesophageal reflux disease)   ? Headache   ? stopped after menopause  ? Neuromuscular disorder ([place])   ? Urticaria   ? ? ?SURGICAL HISTORY: ?Past Surgical History:  ?Procedure Laterality Date  ? APPENDECTOMY  1979  ? BREAST REDUCTION SURGERY    ? BREAST SURGERY  1980  ? breast reduction  ? CHOLECYSTECTOMY    ? NASAL SEPTUM SURGERY    ? ORIF ANKLE FRACTURE Left 03/26/2021  ? Procedure: OPEN REDUCTION INTERNAL FIXATION (ORIF) ANKLE FRACTURE;  Surgeon: Armond Hang, MD;  Location: [facility];  Service: Orthopedics;  Laterality: Left;  ? ? ?SOCIAL HISTORY: ?Social History  ? ?Socioeconomic History  ? Marital status: Married  ?  Spouse name: Not on file  ? Number of children: Not on file  ? Years of education: Not on file  ? Highest education level: Not on file  ?Occupational History  ? Not on file  ?Tobacco Use  ? Smoking status: Former  ?  Packs/day: 1.00  ?  Years: 15.00  ?  Pack years: 15.00  ?  Types: Cigarettes  ?  Quit date: 01/2004  ?  Years since quitting: 17.6  ? Smokeless tobacco: Never  ?Vaping Use  ? Vaping Use: Never used  ?Substance and Sexual Activity  ? Alcohol use: Not Currently  ? Drug use: No  ? Sexual activity: Not Currently   ?Other Topics Concern  ? Not on file  ?Social History Narrative  ? Has 1 child(artificial insem), 2 adopted, and 1 step from partner's former partner  ? Raising 4 grandchildren.  ? ?Social Determinants of Health  ? ?Financial Resource Strain: Not on file  ?Food Insecurity: Not on file  ?Transportation Needs: Not on file  ?Physical Activity: Not on file  ?Stress: Not on file  ?Social Connections: Not on file  ?Intimate Partner Violence: Not on file  ? ? ?FAMILY HISTORY: ?Family History  ?Problem Relation Age of Onset  ? Intellectual disability Mother   ? Hypertension Mother   ? Hyperlipidemia Mother   ? Depression Mother   ? Arthritis Mother   ? Mental illness Mother   ? Heart disease Father   ?  Diabetes Father   ? Stroke Father   ? Heart disease Brother   ? COPD Brother   ? Mental illness Daughter   ? Learning disabilities Daughter   ? Diabetes Daughter   ? Allergic rhinitis Neg Hx   ? Angioedema Neg Hx   ? Asthma Neg Hx   ? Eczema Neg Hx   ? Immunodeficiency Neg Hx   ? Urticaria Neg Hx   ? ? ?ALLERGIES:  is allergic to shellfish allergy and shellfish-derived products. ? ?MEDICATIONS:  ?Current Outpatient Medications  ?Medication Sig Dispense Refill  ? gabapentin (NEURONTIN) 600 MG tablet Take 600 mg by mouth at bedtime.    ? nortriptyline (PAMELOR) 50 MG capsule Take 100 capsules by mouth.    ? amoxicillin-clavulanate (AUGMENTIN) 875-125 MG tablet Take 1 tablet by mouth 2 (two) times daily. 20 tablet 0  ? baclofen (LIORESAL) 10 MG tablet Take 10 mg by mouth daily.    ? busPIRone (BUSPAR) 10 MG tablet Take 1 tablet (10 mg total) by mouth 2 (two) times daily. 60 tablet 0  ? ibuprofen (ADVIL) 600 MG tablet Take 600 mg by mouth every 6 (six) hours as needed. 3 tablets daily    ? Tapentadol HCl 150 MG TB12 Take 150 mg by mouth daily.    ? ?No current facility-administered medications for this visit.  ? ? ? ?PHYSICAL EXAMINATION: ?ECOG PERFORMANCE STATUS: 0 - Asymptomatic ? ?Vitals:  ? 09/05/21 1042  ?BP: (!) 141/65   ?Pulse: (!) 124  ?Resp: 18  ?Temp: (!) 97.5 ?F (36.4 ?C)  ?SpO2: 99%  ? ?Filed Weights  ? 09/05/21 1042  ?Weight: (!) 310 lb 14.4 oz (141 kg)  ? ? ?GENERAL:alert, no distress and comfortable, obese ?SKIN: skin color, t

## 2021-09-08 ENCOUNTER — Inpatient Hospital Stay (HOSPITAL_BASED_OUTPATIENT_CLINIC_OR_DEPARTMENT_OTHER): Payer: Medicare Other | Admitting: Hematology and Oncology

## 2021-09-08 ENCOUNTER — Telehealth: Payer: Self-pay | Admitting: Hematology and Oncology

## 2021-09-08 ENCOUNTER — Inpatient Hospital Stay: Payer: Medicare Other

## 2021-09-08 ENCOUNTER — Encounter: Payer: Self-pay | Admitting: Hematology and Oncology

## 2021-09-08 ENCOUNTER — Other Ambulatory Visit: Payer: Self-pay

## 2021-09-08 ENCOUNTER — Encounter: Payer: Self-pay | Admitting: Family Medicine

## 2021-09-08 VITALS — BP 137/54 | HR 123 | Temp 97.7°F | Resp 18 | Ht 65.0 in | Wt 319.5 lb

## 2021-09-08 DIAGNOSIS — D599 Acquired hemolytic anemia, unspecified: Secondary | ICD-10-CM

## 2021-09-08 DIAGNOSIS — R000 Tachycardia, unspecified: Secondary | ICD-10-CM

## 2021-09-08 DIAGNOSIS — D696 Thrombocytopenia, unspecified: Secondary | ICD-10-CM | POA: Diagnosis not present

## 2021-09-08 DIAGNOSIS — D594 Other nonautoimmune hemolytic anemias: Secondary | ICD-10-CM

## 2021-09-08 DIAGNOSIS — D479 Neoplasm of uncertain behavior of lymphoid, hematopoietic and related tissue, unspecified: Secondary | ICD-10-CM

## 2021-09-08 LAB — CBC WITH DIFFERENTIAL/PLATELET
Abs Immature Granulocytes: 0.04 10*3/uL (ref 0.00–0.07)
Basophils Absolute: 0.1 10*3/uL (ref 0.0–0.1)
Basophils Relative: 1 %
Eosinophils Absolute: 0.3 10*3/uL (ref 0.0–0.5)
Eosinophils Relative: 3 %
HCT: 28.6 % — ABNORMAL LOW (ref 36.0–46.0)
Hemoglobin: 9.2 g/dL — ABNORMAL LOW (ref 12.0–15.0)
Immature Granulocytes: 1 %
Lymphocytes Relative: 27 %
Lymphs Abs: 2.3 10*3/uL (ref 0.7–4.0)
MCH: 31.4 pg (ref 26.0–34.0)
MCHC: 32.2 g/dL (ref 30.0–36.0)
MCV: 97.6 fL (ref 80.0–100.0)
Monocytes Absolute: 0.8 10*3/uL (ref 0.1–1.0)
Monocytes Relative: 9 %
Neutro Abs: 5 10*3/uL (ref 1.7–7.7)
Neutrophils Relative %: 59 %
Platelets: 132 10*3/uL — ABNORMAL LOW (ref 150–400)
RBC: 2.93 MIL/uL — ABNORMAL LOW (ref 3.87–5.11)
RDW: 15.5 % (ref 11.5–15.5)
WBC: 8.6 10*3/uL (ref 4.0–10.5)
nRBC: 0 % (ref 0.0–0.2)

## 2021-09-08 LAB — RETICULOCYTES
Immature Retic Fract: 25.7 % — ABNORMAL HIGH (ref 2.3–15.9)
RBC.: 2.89 MIL/uL — ABNORMAL LOW (ref 3.87–5.11)
Retic Count, Absolute: 139.9 10*3/uL (ref 19.0–186.0)
Retic Ct Pct: 4.8 % — ABNORMAL HIGH (ref 0.4–3.1)

## 2021-09-08 LAB — COMPREHENSIVE METABOLIC PANEL
ALT: 29 U/L (ref 0–44)
AST: 61 U/L — ABNORMAL HIGH (ref 15–41)
Albumin: 3.5 g/dL (ref 3.5–5.0)
Alkaline Phosphatase: 235 U/L — ABNORMAL HIGH (ref 38–126)
Anion gap: 6 (ref 5–15)
BUN: 20 mg/dL (ref 8–23)
CO2: 25 mmol/L (ref 22–32)
Calcium: 8.5 mg/dL — ABNORMAL LOW (ref 8.9–10.3)
Chloride: 109 mmol/L (ref 98–111)
Creatinine, Ser: 0.79 mg/dL (ref 0.44–1.00)
GFR, Estimated: >60 mL/min (ref >60)
Glucose, Bld: 99 mg/dL (ref 70–99)
Potassium: 4.1 mmol/L (ref 3.5–5.1)
Sodium: 140 mmol/L (ref 135–145)
Total Bilirubin: 0.7 mg/dL (ref 0.3–1.2)
Total Protein: 7 g/dL (ref 6.5–8.1)

## 2021-09-08 LAB — DIRECT ANTIGLOBULIN TEST (NOT AT ARMC)
DAT, IgG: NEGATIVE
DAT, complement: NEGATIVE

## 2021-09-08 LAB — TSH: TSH: 1.873 u[IU]/mL (ref 0.308–3.960)

## 2021-09-08 NOTE — Telephone Encounter (Signed)
Scheduled appointment per provider request. Patient aware.  ?

## 2021-09-08 NOTE — Progress Notes (Signed)
Theresa Moran ?CONSULT NOTE ? ?Patient Care Team: ?Tawnya Crook, MD as PCP - General (Family Medicine) ? ?CHIEF COMPLAINTS/PURPOSE OF CONSULTATION:  ?Thrombocytopenia. ? ?ASSESSMENT & PLAN:  ?No problem-specific Assessment & Plan notes found for this encounter. ? ?Orders Placed This Encounter  ?Procedures  ? CT MAXILLOFACIAL W CONTRAST  ?  Standing Status:   Future  ?  Standing Expiration Date:   09/09/2022  ?  Order Specific Question:   If indicated for the ordered procedure, I authorize the administration of contrast media per Radiology protocol  ?  Answer:   Yes  ?  Order Specific Question:   Preferred imaging location?  ?  Answer:   [facility]  ? CT CHEST ABDOMEN PELVIS W CONTRAST  ?  Standing Status:   Future  ?  Standing Expiration Date:   09/09/2022  ?  Order Specific Question:   Preferred imaging location?  ?  Answer:   [facility]  ?  Order Specific Question:   Is Oral Contrast requested for this exam?  ?  Answer:   Yes, Per Radiology protocol  ? PNH Profile (-High Sensitivity)  ?  Standing Status:   Future  ?  Number of Occurrences:   1  ?  Standing Expiration Date:   09/09/2022  ? Flow Cytometry, Peripheral Blood (Oncology)  ?  Standing Status:   Future  ?  Number of Occurrences:   1  ?  Standing Expiration Date:   09/09/2022  ? ANA, IFA (with reflex)  ?  Standing Status:   Future  ?  Number of Occurrences:   1  ?  Standing Expiration Date:   09/08/2022  ? C3 complement  ? C4 complement  ?  Standing Status:   Future  ?  Number of Occurrences:   1  ?  Standing Expiration Date:   09/08/2022  ? Reticulocytes  ?  Standing Status:   Future  ?  Number of Occurrences:   1  ?  Standing Expiration Date:   09/09/2022  ? Comprehensive metabolic panel  ?  Standing Status:   Standing  ?  Number of Occurrences:   28  ?  Standing Expiration Date:   09/09/2022  ? Direct antiglobulin test (not at [facility])  ?  Standing Status:   Future  ?  Number of Occurrences:   1  ?  Standing Expiration Date:    09/09/2022  ? ?This is a pleasant 61 year old female patient with past medical history significant for neuropathy anxiety/depression referred to hematology for evaluation of progressive thrombocytopenia.  We have discussed the following details about thrombocytopenia ? ?During her initial visit, we have noticed that she has downtrending hemoglobin as well as platelet count.  I have ordered some labs for hemolysis which showed hemolytic anemia.  Hence have requested her to come back for follow-up for additional labs today.  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  At this time it is not clear if it is immune mediated cyst versus nonimmune mediated.  I ordered [place] test, PNH panel, complement, ANA, repeat CBC, reticulocyte count today.  I have also ordered CT chest abdomen pelvis as well as a CT maxillofacial sinus since she has some asymmetry of the right maxillary sinus as well as since lymphomas can present with hemolytic anemias. ? ?She is starting to feel fatigued, sleepy.  I have also asked her to stop the Augmentin for now since  we do not know if this could have triggered the hemolytic anemia. ? ?She will return to clinic on Friday with repeat labs.  She understands that we may have to try prednisone/rituximab/transfusions if this is immune mediated hemolytic anemia.  She understands that we may also need to proceed with bone marrow aspiration and biopsy. ? ?Return to clinic as mentioned above ? ?HISTORY OF PRESENTING ILLNESS:  ?Theresa Moran 61 y.o. female is here because of thrombocytopenia ? ?This is a very pleasant 61 year old female patient with history of thrombocytopenia referred to hematology for evaluation and recommendations.   ? ?Patient is here for follow-up.  She is starting to feel more fatigued.  She has been sleeping more.  She feels cold all the times, denies any fevers or drenching night sweats.  No weight loss.  She has also noticed that her  heart rate continues to stay high.  She has been feeling some shortness of breath on exertion.  No blood loss reported. ?Rest of the pertinent 10 point ROS reviewed and negative ? ?MEDICAL HISTORY:  ?Past Medical History:  ?Diagnosis Date  ? Allergy   ? Anxiety   ? Asthma   ? in the past  ? Chronic pain   ? Depression   ? Gallstones   ? GERD (gastroesophageal reflux disease)   ? Headache   ? stopped after menopause  ? Neuromuscular disorder ([place])   ? Urticaria   ? ? ?SURGICAL HISTORY: ?Past Surgical History:  ?Procedure Laterality Date  ? APPENDECTOMY  1979  ? BREAST REDUCTION SURGERY    ? BREAST SURGERY  1980  ? breast reduction  ? CHOLECYSTECTOMY    ? NASAL SEPTUM SURGERY    ? ORIF ANKLE FRACTURE Left 03/26/2021  ? Procedure: OPEN REDUCTION INTERNAL FIXATION (ORIF) ANKLE FRACTURE;  Surgeon: Armond Hang, MD;  Location: [place];  Service: Orthopedics;  Laterality: Left;  ? ? ?SOCIAL HISTORY: ?Social History  ? ?Socioeconomic History  ? Marital status: Married  ?  Spouse name: Not on file  ? Number of children: Not on file  ? Years of education: Not on file  ? Highest education level: Not on file  ?Occupational History  ? Not on file  ?Tobacco Use  ? Smoking status: Former  ?  Packs/day: 1.00  ?  Years: 15.00  ?  Pack years: 15.00  ?  Types: Cigarettes  ?  Quit date: 01/2004  ?  Years since quitting: 17.6  ? Smokeless tobacco: Never  ?Vaping Use  ? Vaping Use: Never used  ?Substance and Sexual Activity  ? Alcohol use: Not Currently  ? Drug use: No  ? Sexual activity: Not Currently  ?Other Topics Concern  ? Not on file  ?Social History Narrative  ? Has 1 child(artificial insem), 2 adopted, and 1 step from partner's former partner  ? Raising 4 grandchildren.  ? ?Social Determinants of Health  ? ?Financial Resource Strain: Not on file  ?Food Insecurity: Not on file  ?Transportation Needs: Not on file  ?Physical Activity: Not on file  ?Stress: Not on file  ?Social Connections: Not on file  ?Intimate Partner  Violence: Not on file  ? ? ?FAMILY HISTORY: ?Family History  ?Problem Relation Age of Onset  ? Intellectual disability Mother   ? Hypertension Mother   ? Hyperlipidemia Mother   ? Depression Mother   ? Arthritis Mother   ? Mental illness Mother   ? Heart disease Father   ? Diabetes Father   ?  Stroke Father   ? Heart disease Brother   ? COPD Brother   ? Mental illness Daughter   ? Learning disabilities Daughter   ? Diabetes Daughter   ? Allergic rhinitis Neg Hx   ? Angioedema Neg Hx   ? Asthma Neg Hx   ? Eczema Neg Hx   ? Immunodeficiency Neg Hx   ? Urticaria Neg Hx   ? ? ?ALLERGIES:  is allergic to shellfish allergy and shellfish-derived products. ? ?MEDICATIONS:  ?Current Outpatient Medications  ?Medication Sig Dispense Refill  ? amoxicillin-clavulanate (AUGMENTIN) 875-125 MG tablet Take 1 tablet by mouth 2 (two) times daily. 20 tablet 0  ? baclofen (LIORESAL) 10 MG tablet Take 10 mg by mouth daily.    ? busPIRone (BUSPAR) 10 MG tablet Take 1 tablet (10 mg total) by mouth 2 (two) times daily. 60 tablet 0  ? gabapentin (NEURONTIN) 600 MG tablet Take 600 mg by mouth at bedtime.    ? ibuprofen (ADVIL) 600 MG tablet Take 600 mg by mouth every 6 (six) hours as needed. 3 tablets daily    ? nortriptyline (PAMELOR) 50 MG capsule Take 100 capsules by mouth.    ? Tapentadol HCl 150 MG TB12 Take 150 mg by mouth daily.    ? ?No current facility-administered medications for this visit.  ? ? ? ?PHYSICAL EXAMINATION: ?ECOG PERFORMANCE STATUS: 0 - Asymptomatic ? ?Vitals:  ? 09/08/21 1351  ?BP: (!) 137/54  ?Pulse: (!) 123  ?Resp: 18  ?Temp: 97.7 ?F (36.5 ?C)  ?SpO2: 100%  ? ?Filed Weights  ? 09/08/21 1351  ?Weight: (!) 319 lb 8 oz (144.9 kg)  ? ? ?Physical exam deferred today in lieu of counseling.  She appears to be in no acute distress ? ?LABORATORY DATA:  ?I have reviewed the data as listed ?Lab Results  ?Component Value Date  ? WBC 8.6 09/08/2021  ? HGB 9.2 (L) 09/08/2021  ? HCT 28.6 (L) 09/08/2021  ? MCV 97.6 09/08/2021  ? PLT  132 (L) 09/08/2021  ? ?  Chemistry   ?   ?Component Value Date/Time  ? NA 139 08/04/2021 0930  ? NA 141 12/18/2019 1506  ? K 4.2 08/04/2021 0930  ? CL 106 08/04/2021 0930  ? CO2 27 08/04/2021 0930  ? BUN 19 03/1

## 2021-09-09 ENCOUNTER — Encounter: Payer: Self-pay | Admitting: Hematology and Oncology

## 2021-09-09 LAB — C3 COMPLEMENT: C3 Complement: 121 mg/dL (ref 82–167)

## 2021-09-09 LAB — C4 COMPLEMENT: Complement C4, Body Fluid: 11 mg/dL — ABNORMAL LOW (ref 12–38)

## 2021-09-09 LAB — PATHOLOGIST SMEAR REVIEW

## 2021-09-09 LAB — ANTINUCLEAR ANTIBODIES, IFA: ANA Ab, IFA: NEGATIVE

## 2021-09-10 LAB — SURGICAL PATHOLOGY

## 2021-09-11 ENCOUNTER — Ambulatory Visit (HOSPITAL_COMMUNITY): Payer: Medicare Other

## 2021-09-11 ENCOUNTER — Ambulatory Visit: Payer: Medicare Other | Admitting: Internal Medicine

## 2021-09-11 ENCOUNTER — Ambulatory Visit (HOSPITAL_COMMUNITY)
Admission: RE | Admit: 2021-09-11 | Discharge: 2021-09-11 | Disposition: A | Discharge status: Home / Self Care | Payer: Medicare Other | Source: Ambulatory Visit | Attending: Hematology and Oncology | Admitting: Hematology and Oncology

## 2021-09-11 DIAGNOSIS — D594 Other nonautoimmune hemolytic anemias: Secondary | ICD-10-CM | POA: Diagnosis present

## 2021-09-11 DIAGNOSIS — D599 Acquired hemolytic anemia, unspecified: Secondary | ICD-10-CM

## 2021-09-11 DIAGNOSIS — D479 Neoplasm of uncertain behavior of lymphoid, hematopoietic and related tissue, unspecified: Secondary | ICD-10-CM

## 2021-09-11 DIAGNOSIS — R161 Splenomegaly, not elsewhere classified: Secondary | ICD-10-CM

## 2021-09-11 DIAGNOSIS — D696 Thrombocytopenia, unspecified: Secondary | ICD-10-CM | POA: Insufficient documentation

## 2021-09-11 HISTORY — DX: Splenomegaly, not elsewhere classified: R16.1

## 2021-09-11 LAB — PNH PROFILE (-HIGH SENSITIVITY)

## 2021-09-11 LAB — FLOW CYTOMETRY

## 2021-09-11 MED ORDER — IOHEXOL 300 MG/ML  SOLN
100.0000 mL | Freq: Once | INTRAMUSCULAR | Status: AC | PRN
  Administered 2021-09-11: 100 mL via INTRAVENOUS

## 2021-09-11 MED ORDER — SODIUM CHLORIDE (PF) 0.9 % IJ SOLN
INTRAMUSCULAR | Status: AC
  Filled 2021-09-11: qty 50

## 2021-09-12 ENCOUNTER — Encounter: Payer: Self-pay | Admitting: Gastroenterology

## 2021-09-12 ENCOUNTER — Encounter: Payer: Self-pay | Admitting: Hematology and Oncology

## 2021-09-12 ENCOUNTER — Inpatient Hospital Stay (HOSPITAL_BASED_OUTPATIENT_CLINIC_OR_DEPARTMENT_OTHER): Payer: Medicare Other | Admitting: Hematology and Oncology

## 2021-09-12 ENCOUNTER — Other Ambulatory Visit: Payer: Self-pay

## 2021-09-12 ENCOUNTER — Inpatient Hospital Stay: Payer: Medicare Other

## 2021-09-12 VITALS — BP 150/76 | HR 119 | Temp 97.7°F | Resp 18 | Wt 324.3 lb

## 2021-09-12 DIAGNOSIS — D696 Thrombocytopenia, unspecified: Secondary | ICD-10-CM

## 2021-09-12 DIAGNOSIS — D599 Acquired hemolytic anemia, unspecified: Secondary | ICD-10-CM

## 2021-09-12 DIAGNOSIS — R000 Tachycardia, unspecified: Secondary | ICD-10-CM

## 2021-09-12 LAB — CBC WITH DIFFERENTIAL/PLATELET
Abs Immature Granulocytes: 0.01 10*3/uL (ref 0.00–0.07)
Basophils Absolute: 0.1 10*3/uL (ref 0.0–0.1)
Basophils Relative: 1 %
Eosinophils Absolute: 0.2 10*3/uL (ref 0.0–0.5)
Eosinophils Relative: 3 %
HCT: 27.2 % — ABNORMAL LOW (ref 36.0–46.0)
Hemoglobin: 8.8 g/dL — ABNORMAL LOW (ref 12.0–15.0)
Immature Granulocytes: 0 %
Lymphocytes Relative: 32 %
Lymphs Abs: 2.2 10*3/uL (ref 0.7–4.0)
MCH: 31.7 pg (ref 26.0–34.0)
MCHC: 32.4 g/dL (ref 30.0–36.0)
MCV: 97.8 fL (ref 80.0–100.0)
Monocytes Absolute: 0.5 10*3/uL (ref 0.1–1.0)
Monocytes Relative: 7 %
Neutro Abs: 3.8 10*3/uL (ref 1.7–7.7)
Neutrophils Relative %: 57 %
Platelets: 123 10*3/uL — ABNORMAL LOW (ref 150–400)
RBC: 2.78 MIL/uL — ABNORMAL LOW (ref 3.87–5.11)
RDW: 15.6 % — ABNORMAL HIGH (ref 11.5–15.5)
WBC: 6.8 10*3/uL (ref 4.0–10.5)
nRBC: 0 % (ref 0.0–0.2)

## 2021-09-12 LAB — COMPREHENSIVE METABOLIC PANEL
ALT: 25 U/L (ref 0–44)
AST: 59 U/L — ABNORMAL HIGH (ref 15–41)
Albumin: 3.4 g/dL — ABNORMAL LOW (ref 3.5–5.0)
Alkaline Phosphatase: 191 U/L — ABNORMAL HIGH (ref 38–126)
Anion gap: 3 — ABNORMAL LOW (ref 5–15)
BUN: 18 mg/dL (ref 8–23)
CO2: 25 mmol/L (ref 22–32)
Calcium: 8.6 mg/dL — ABNORMAL LOW (ref 8.9–10.3)
Chloride: 111 mmol/L (ref 98–111)
Creatinine, Ser: 0.72 mg/dL (ref 0.44–1.00)
GFR, Estimated: >60 mL/min (ref >60)
Glucose, Bld: 95 mg/dL (ref 70–99)
Potassium: 4.1 mmol/L (ref 3.5–5.1)
Sodium: 139 mmol/L (ref 135–145)
Total Bilirubin: 0.9 mg/dL (ref 0.3–1.2)
Total Protein: 7 g/dL (ref 6.5–8.1)

## 2021-09-12 LAB — TSH: TSH: 2.628 u[IU]/mL (ref 0.350–4.500)

## 2021-09-12 NOTE — Progress Notes (Signed)
Schedule message sent to place pt on Dr Lindi Adie schedule for 4/24 bone marrow biopy. Flowtometry aware, OK per Infusion charge RN.  ?

## 2021-09-12 NOTE — Progress Notes (Signed)
[facility] ?CONSULT NOTE ? ?Patient Care Team: ?Theresa Crook, MD as PCP - General (Family Medicine) ? ?CHIEF COMPLAINTS/PURPOSE OF CONSULTATION:  ?Thrombocytopenia. ? ?ASSESSMENT & PLAN:  ? ?This is a pleasant 61 year old female patient with past medical history significant for neuropathy anxiety/depression referred to hematology for evaluation of progressive thrombocytopenia.   ? ?On evaluation of labs, she was found to have progressive anemia, nonimmune mediated hemolysis with reticulocyte count around 5%.  No elevated LDH or total bilirubin.  DAT test is negative, there appears to be some complement consumption.  Given no clear etiology for the nonimmune mediated hemolytic anemia, recommended imaging which showed hepatic cirrhosis and splenomegaly, diffuse thickening of the stomach concerning for infiltrative diseases such as lymphoma or inflammation.  Endoscopy was recommended. ? ?She also appears to have gained about 15 pounds of weight in the past 2 through 3 weeks.  She continues to have ongoing tachycardia.  We have discussed about proceeding with bone marrow aspiration biopsy which will shed some light if there is an underlying lymphoma.  I also sent an in basket message to gastroenterology.  Again with nonimmune mediated hemolysis, there is no role for prednisone hence we will have to provide supportive care.  We will arrange for transfusion if hemoglobin drops below 8 g/dL. ? ?Bone marrow biopsy has been arranged for Monday morning.  Patient's spouse was called and informed of the details. ? ? ?HISTORY OF PRESENTING ILLNESS:  ?Theresa Moran 61 y.o. female is here because of thrombocytopenia ? ?This is a very pleasant 61 year old female patient with history of thrombocytopenia referred to hematology for evaluation and recommendations.   ? ?Patient is here for follow-up.  She continues to feel more fatigued.  She feels that her hands are tight and she is retaining a lot of fluid.  Wife  says she has gained almost 15 pounds of weight in the past few weeks which is unusual.  She feels more sleepy during the day.  Rest of the pertinent 10 point ROS reviewed and negative ? ?MEDICAL HISTORY:  ?Past Medical History:  ?Diagnosis Date  ? Allergy   ? Anxiety   ? Asthma   ? in the past  ? Chronic pain   ? Depression   ? Gallstones   ? GERD (gastroesophageal reflux disease)   ? Headache   ? stopped after menopause  ? Neuromuscular disorder ([place])   ? Urticaria   ? ? ?SURGICAL HISTORY: ?Past Surgical History:  ?Procedure Laterality Date  ? APPENDECTOMY  1979  ? BREAST REDUCTION SURGERY    ? BREAST SURGERY  1980  ? breast reduction  ? CHOLECYSTECTOMY    ? NASAL SEPTUM SURGERY    ? ORIF ANKLE FRACTURE Left 03/26/2021  ? Procedure: OPEN REDUCTION INTERNAL FIXATION (ORIF) ANKLE FRACTURE;  Surgeon: Armond Hang, MD;  Location: [facility];  Service: Orthopedics;  Laterality: Left;  ? ? ?SOCIAL HISTORY: ?Social History  ? ?Socioeconomic History  ? Marital status: Married  ?  Spouse name: Not on file  ? Number of children: Not on file  ? Years of education: Not on file  ? Highest education level: Not on file  ?Occupational History  ? Not on file  ?Tobacco Use  ? Smoking status: Former  ?  Packs/day: 1.00  ?  Years: 15.00  ?  Pack years: 15.00  ?  Types: Cigarettes  ?  Quit date: 01/2004  ?  Years since quitting: 17.6  ? Smokeless tobacco: Never  ?  Vaping Use  ? Vaping Use: Never used  ?Substance and Sexual Activity  ? Alcohol use: Not Currently  ? Drug use: No  ? Sexual activity: Not Currently  ?Other Topics Concern  ? Not on file  ?Social History Narrative  ? Has 1 child(artificial insem), 2 adopted, and 1 step from partner's former partner  ? Raising 4 grandchildren.  ? ?Social Determinants of Health  ? ?Financial Resource Strain: Not on file  ?Food Insecurity: Not on file  ?Transportation Needs: Not on file  ?Physical Activity: Not on file  ?Stress: Not on file  ?Social Connections: Not on file  ?Intimate Partner  Violence: Not on file  ? ? ?FAMILY HISTORY: ?Family History  ?Problem Relation Age of Onset  ? Intellectual disability Mother   ? Hypertension Mother   ? Hyperlipidemia Mother   ? Depression Mother   ? Arthritis Mother   ? Mental illness Mother   ? Heart disease Father   ? Diabetes Father   ? Stroke Father   ? Heart disease Brother   ? COPD Brother   ? Mental illness Daughter   ? Learning disabilities Daughter   ? Diabetes Daughter   ? Allergic rhinitis Neg Hx   ? Angioedema Neg Hx   ? Asthma Neg Hx   ? Eczema Neg Hx   ? Immunodeficiency Neg Hx   ? Urticaria Neg Hx   ? ? ?ALLERGIES:  is allergic to shellfish allergy and shellfish-derived products. ? ?MEDICATIONS:  ?Current Outpatient Medications  ?Medication Sig Dispense Refill  ? amoxicillin-clavulanate (AUGMENTIN) 875-125 MG tablet Take 1 tablet by mouth 2 (two) times daily. 20 tablet 0  ? baclofen (LIORESAL) 10 MG tablet Take 10 mg by mouth daily.    ? busPIRone (BUSPAR) 10 MG tablet Take 1 tablet (10 mg total) by mouth 2 (two) times daily. 60 tablet 0  ? gabapentin (NEURONTIN) 600 MG tablet Take 600 mg by mouth at bedtime.    ? ibuprofen (ADVIL) 600 MG tablet Take 600 mg by mouth every 6 (six) hours as needed. 3 tablets daily    ? nortriptyline (PAMELOR) 50 MG capsule Take 100 capsules by mouth.    ? Tapentadol HCl 150 MG TB12 Take 150 mg by mouth daily.    ? ?No current facility-administered medications for this visit.  ? ? ? ?PHYSICAL EXAMINATION: ?ECOG PERFORMANCE STATUS: 0 - Asymptomatic ? ?Vitals:  ? 09/12/21 1310  ?BP: (!) 150/76  ?Pulse: (!) 119  ?Resp: 18  ?Temp: 97.7 ?F (36.5 ?C)  ?SpO2: 100%  ? ?Filed Weights  ? 09/12/21 1310  ?Weight: (!) 324 lb 5 oz (147.1 kg)  ? ? ?Physical exam deferred today in lieu of counseling.  She appears to be in no acute distress ? ?LABORATORY DATA:  ?I have reviewed the data as listed ?Lab Results  ?Component Value Date  ? WBC 6.8 09/12/2021  ? HGB 8.8 (L) 09/12/2021  ? HCT 27.2 (L) 09/12/2021  ? MCV 97.8 09/12/2021  ? PLT  123 (L) 09/12/2021  ? ?  Chemistry   ?   ?Component Value Date/Time  ? NA 139 09/12/2021 1236  ? NA 141 12/18/2019 1506  ? K 4.1 09/12/2021 1236  ? CL 111 09/12/2021 1236  ? CO2 25 09/12/2021 1236  ? BUN 18 09/12/2021 1236  ? BUN 16 12/18/2019 1506  ? CREATININE 0.72 09/12/2021 1236  ? CREATININE 1.09 (H) 12/24/2015 1649  ?    ?Component Value Date/Time  ? CALCIUM 8.6 (L) 09/12/2021  1236  ? ALKPHOS 191 (H) 09/12/2021 1236  ? AST 59 (H) 09/12/2021 1236  ? ALT 25 09/12/2021 1236  ? BILITOT 0.9 09/12/2021 1236  ? BILITOT 0.4 12/18/2019 1506  ?  ? ?I reviewed the labs today with the patient.  No indication for blood transfusion today. ? ?RADIOGRAPHIC STUDIES: ?I have personally reviewed the radiological images as listed and agreed with the findings in the report. ?CT MAXILLOFACIAL W CONTRAST ? ?Result Date: 09/12/2021 ?CLINICAL DATA:  Maxillary/facial abscess. Right maxillary sinus asymmetry. EXAM: CT MAXILLOFACIAL WITH CONTRAST TECHNIQUE: Multidetector CT imaging of the maxillofacial structures was performed with intravenous contrast. Multiplanar CT image reconstructions were also generated. RADIATION DOSE REDUCTION: This exam was performed according to the departmental dose-optimization program which includes automated exposure control, adjustment of the mA and/or kV according to patient size and/or use of iterative reconstruction technique. CONTRAST:  15m OMNIPAQUE IOHEXOL 300 MG/ML  SOLN COMPARISON:  None. FINDINGS: Osseous: No fracture or mandibular dislocation. No destructive process. Orbits: Negative. No traumatic or inflammatory finding. Sinuses: Suspected prior ethmoidectomies. Left maxillary antrostomy. Mild mucosal thickening of the left ethmoid cavity and inferior right maxillary sinus. Otherwise, clear sinuses. Soft tissues: Negative. Limited intracranial: No significant or unexpected finding. IMPRESSION: Prior endoscopic sinus surgery and mild paranasal sinus mucosal thickening. Otherwise, unremarkable  maxillofacial CT. Electronically Signed   By: FMargaretha SheffieldM.D.   On: 09/12/2021 08:44   ? ?All questions were answered. The patient knows to call the clinic with any problems, questions or concerns. ?

## 2021-09-15 ENCOUNTER — Other Ambulatory Visit: Payer: Self-pay

## 2021-09-15 ENCOUNTER — Inpatient Hospital Stay: Payer: Medicare Other

## 2021-09-15 ENCOUNTER — Inpatient Hospital Stay (HOSPITAL_BASED_OUTPATIENT_CLINIC_OR_DEPARTMENT_OTHER): Payer: Medicare Other | Admitting: Hematology and Oncology

## 2021-09-15 ENCOUNTER — Other Ambulatory Visit: Payer: Self-pay | Admitting: Hematology and Oncology

## 2021-09-15 VITALS — BP 134/68 | HR 118 | Temp 98.1°F | Resp 18

## 2021-09-15 DIAGNOSIS — D696 Thrombocytopenia, unspecified: Secondary | ICD-10-CM

## 2021-09-15 DIAGNOSIS — D479 Neoplasm of uncertain behavior of lymphoid, hematopoietic and related tissue, unspecified: Secondary | ICD-10-CM

## 2021-09-15 DIAGNOSIS — R000 Tachycardia, unspecified: Secondary | ICD-10-CM

## 2021-09-15 DIAGNOSIS — D599 Acquired hemolytic anemia, unspecified: Secondary | ICD-10-CM

## 2021-09-15 LAB — COMPREHENSIVE METABOLIC PANEL
ALT: 22 U/L (ref 0–44)
AST: 47 U/L — ABNORMAL HIGH (ref 15–41)
Albumin: 3 g/dL — ABNORMAL LOW (ref 3.5–5.0)
Alkaline Phosphatase: 126 U/L (ref 38–126)
Anion gap: 3 — ABNORMAL LOW (ref 5–15)
BUN: 22 mg/dL (ref 8–23)
CO2: 26 mmol/L (ref 22–32)
Calcium: 8.1 mg/dL — ABNORMAL LOW (ref 8.9–10.3)
Chloride: 110 mmol/L (ref 98–111)
Creatinine, Ser: 0.85 mg/dL (ref 0.44–1.00)
GFR, Estimated: >60 mL/min (ref >60)
Glucose, Bld: 131 mg/dL — ABNORMAL HIGH (ref 70–99)
Potassium: 4.3 mmol/L (ref 3.5–5.1)
Sodium: 139 mmol/L (ref 135–145)
Total Bilirubin: 0.8 mg/dL (ref 0.3–1.2)
Total Protein: 5.9 g/dL — ABNORMAL LOW (ref 6.5–8.1)

## 2021-09-15 LAB — CBC WITH DIFFERENTIAL/PLATELET
Abs Immature Granulocytes: 0.01 10*3/uL (ref 0.00–0.07)
Basophils Absolute: 0.1 10*3/uL (ref 0.0–0.1)
Basophils Relative: 1 %
Eosinophils Absolute: 0.2 10*3/uL (ref 0.0–0.5)
Eosinophils Relative: 3 %
HCT: 20.5 % — ABNORMAL LOW (ref 36.0–46.0)
Hemoglobin: 6.8 g/dL — CL (ref 12.0–15.0)
Immature Granulocytes: 0 %
Lymphocytes Relative: 22 %
Lymphs Abs: 1.2 10*3/uL (ref 0.7–4.0)
MCH: 32.5 pg (ref 26.0–34.0)
MCHC: 33.2 g/dL (ref 30.0–36.0)
MCV: 98.1 fL (ref 80.0–100.0)
Monocytes Absolute: 0.4 10*3/uL (ref 0.1–1.0)
Monocytes Relative: 7 %
Neutro Abs: 3.8 10*3/uL (ref 1.7–7.7)
Neutrophils Relative %: 67 %
Platelets: 107 10*3/uL — ABNORMAL LOW (ref 150–400)
RBC: 2.09 MIL/uL — ABNORMAL LOW (ref 3.87–5.11)
RDW: 15.4 % (ref 11.5–15.5)
WBC: 5.6 10*3/uL (ref 4.0–10.5)
nRBC: 0 % (ref 0.0–0.2)

## 2021-09-15 LAB — ABO/RH: ABO/RH(D): O NEG

## 2021-09-15 LAB — TSH: TSH: 3.711 u[IU]/mL (ref 0.350–4.500)

## 2021-09-15 LAB — PREPARE RBC (CROSSMATCH)

## 2021-09-15 MED ORDER — DIPHENHYDRAMINE HCL 25 MG PO CAPS
25.0000 mg | ORAL_CAPSULE | Freq: Once | ORAL | Status: AC
  Administered 2021-09-15: 25 mg via ORAL
  Filled 2021-09-15: qty 1

## 2021-09-15 MED ORDER — SODIUM CHLORIDE 0.9% IV SOLUTION
250.0000 mL | Freq: Once | INTRAVENOUS | Status: AC
  Administered 2021-09-15: 250 mL via INTRAVENOUS

## 2021-09-15 MED ORDER — ACETAMINOPHEN 325 MG PO TABS
650.0000 mg | ORAL_TABLET | Freq: Once | ORAL | Status: AC
  Administered 2021-09-15: 650 mg via ORAL
  Filled 2021-09-15: qty 2

## 2021-09-15 NOTE — Patient Instructions (Signed)
Bone Marrow Aspiration and Bone Marrow Biopsy, Adult, Care After This sheet gives you information about how to care for yourself after your procedure. Your health care provider may also give you more specific instructions. If you have problems or questions, contact your health care provider. What can I expect after the procedure? After the procedure, it is common to have: Mild pain and tenderness. Swelling. Bruising. Follow these instructions at home: Puncture site care  Follow instructions from your health care provider about how to take care of the puncture site. Make sure you: Wash your hands with soap and water before and after you change your bandage (dressing). If soap and water are not available, use hand sanitizer. Change your dressing as told by your health care provider. Check your puncture site every day for signs of infection. Check for: More redness, swelling, or pain. Fluid or blood. Warmth. Pus or a bad smell. Activity Return to your normal activities as told by your health care provider. Ask your health care provider what activities are safe for you. Do not lift anything that is heavier than 10 lb (4.5 kg), or the limit that you are told, until your health care provider says that it is safe. Do not drive for 24 hours if you were given a sedative during your procedure. General instructions  Take over-the-counter and prescription medicines only as told by your health care provider. Do not take baths, swim, or use a hot tub until your health care provider approves. Ask your health care provider if you may take showers. You may only be allowed to take sponge baths. If directed, put ice on the affected area. To do this: Put ice in a plastic bag. Place a towel between your skin and the bag. Leave the ice on for 20 minutes, 2-3 times a day. Keep all follow-up visits as told by your health care provider. This is important. Contact a health care provider if: Your pain is not  controlled with medicine. You have a fever. You have more redness, swelling, or pain around the puncture site. You have fluid or blood coming from the puncture site. Your puncture site feels warm to the touch. You have pus or a bad smell coming from the puncture site. Summary After the procedure, it is common to have mild pain, tenderness, swelling, and bruising. Follow instructions from your health care provider about how to take care of the puncture site and what activities are safe for you. Take over-the-counter and prescription medicines only as told by your health care provider. Contact a health care provider if you have any signs of infection, such as fluid or blood coming from the puncture site. This information is not intended to replace advice given to you by your health care provider. Make sure you discuss any questions you have with your health care provider. Document Revised: 09/27/2018 Document Reviewed: 09/27/2018 Elsevier Patient Education  2023 [employer]  

## 2021-09-15 NOTE — Progress Notes (Signed)
Patient stayed for 30 minute observation following bone marrow biopsy. Patient provided nutrition. Labs were drawn, post procedure vitals stable and dressing dry, clean and intact upon discharge. Patient was provided with discharge instructions.  ? ?Follow up with Dr. Chryl Heck scheduled 09/22/21 to review results. Patient is aware. ?

## 2021-09-15 NOTE — Progress Notes (Signed)
2 units PRBC ordered per MD. Pt needs type & screen and ABO, will return to lab to be redrawn, then is scheduled for 2 units in infusion. Delle Reining, RN aware. BB aware and can see prepare order.  ?

## 2021-09-15 NOTE — Progress Notes (Signed)
INDICATION: Anemia and thrombocytopenia ? ?Bone Marrow Biopsy and Aspiration Procedure Note  ? ?Informed consent was obtained and potential risks including bleeding, infection and pain were reviewed with the patient. ? ?The patient's name, date of birth, identification, consent and allergies were verified prior to the start of procedure and time out was performed. ? ?The right posterior iliac crest was chosen as the site of biopsy. ? ?The skin was prepped with ChloraPrep.  ? ?8 cc of 1% lidocaine was used to provide local anaesthesia.  ? ?10 cc of bone marrow aspirate was obtained followed by 0.5 cm biopsy (2 attempts).  ?Pressure was applied to the biopsy site and bandage was placed over the biopsy site. ?Patient was made to lie on the back for 30 mins prior to discharge. ? ?The procedure was tolerated well. ?COMPLICATIONS: None ?BLOOD LOSS: 1 ml ?The patient was discharged home in stable condition with a 1 week follow up to review results.    ? ?Specimens sent for flow cytometry, cytogenetics and additional studies. ? ?Signed ?Harriette Ohara, MD  ?

## 2021-09-15 NOTE — Patient Instructions (Signed)
Blood Transfusion, Adult, Care After This sheet gives you information about how to care for yourself after your procedure. Your doctor may also give you more specific instructions. If you have problems or questions, contact your doctor. What can I expect after the procedure? After the procedure, it is common to have: Bruising and soreness at the IV site. A headache. Follow these instructions at home: Insertion site care     Follow instructions from your doctor about how to take care of your insertion site. This is where an IV tube was put into your vein. Make sure you: Wash your hands with soap and water before and after you change your bandage (dressing). If you cannot use soap and water, use hand sanitizer. Change your bandage as told by your doctor. Check your insertion site every day for signs of infection. Check for: Redness, swelling, or pain. Bleeding from the site. Warmth. Pus or a bad smell. General instructions Take over-the-counter and prescription medicines only as told by your doctor. Rest as told by your doctor. Go back to your normal activities as told by your doctor. Keep all follow-up visits as told by your doctor. This is important. Contact a doctor if: You have itching or red, swollen areas of skin (hives). You feel worried or nervous (anxious). You feel weak after doing your normal activities. You have redness, swelling, warmth, or pain around the insertion site. You have blood coming from the insertion site, and the blood does not stop with pressure. You have pus or a bad smell coming from the insertion site. Get help right away if: You have signs of a serious reaction. This may be coming from an allergy or the body's defense system (immune system). Signs include: Trouble breathing or shortness of breath. Swelling of the face or feeling warm (flushed). Fever or chills. Head, chest, or back pain. Dark pee (urine) or blood in the pee. Widespread rash. Fast  heartbeat. Feeling dizzy or light-headed. You may receive your blood transfusion in an outpatient setting. If so, you will be told whom to contact to report any reactions. These symptoms may be an emergency. Do not wait to see if the symptoms will go away. Get medical help right away. Call your local emergency services (911 in the [place] Do not drive yourself to the hospital. Summary Bruising and soreness at the IV site are common. Check your insertion site every day for signs of infection. Rest as told by your doctor. Go back to your normal activities as told by your doctor. Get help right away if you have signs of a serious reaction. This information is not intended to replace advice given to you by your health care provider. Make sure you discuss any questions you have with your health care provider. Document Revised: 09/05/2020 Document Reviewed: 11/03/2018 Elsevier Patient Education  2023 [employer] Inc.  

## 2021-09-16 LAB — TYPE AND SCREEN
ABO/RH(D): O NEG
Antibody Screen: NEGATIVE
Unit division: 0
Unit division: 0

## 2021-09-16 LAB — IGG, IGA, IGM
IgA: 545 mg/dL — ABNORMAL HIGH (ref 87–352)
IgG (Immunoglobin G), Serum: 1166 mg/dL (ref 586–1602)
IgM (Immunoglobulin M), Srm: 98 mg/dL (ref 26–217)

## 2021-09-16 LAB — BPAM RBC
Blood Product Expiration Date: 202305232359
Blood Product Expiration Date: 202305232359
ISSUE DATE / TIME: 202304241127
ISSUE DATE / TIME: 202304241127
Unit Type and Rh: 9500
Unit Type and Rh: 9500

## 2021-09-16 NOTE — Progress Notes (Signed)
Spoke to patient and she stated that she is feeling much better since receiving blood. Patient stated that she will call if anything changes.  ?

## 2021-09-17 ENCOUNTER — Telehealth: Payer: Self-pay | Admitting: *Deleted

## 2021-09-17 ENCOUNTER — Other Ambulatory Visit: Payer: Self-pay | Admitting: *Deleted

## 2021-09-17 DIAGNOSIS — D649 Anemia, unspecified: Secondary | ICD-10-CM

## 2021-09-17 DIAGNOSIS — D696 Thrombocytopenia, unspecified: Secondary | ICD-10-CM

## 2021-09-17 LAB — PROTEIN ELECTROPHORESIS, SERUM, WITH REFLEX
A/G Ratio: 1 (ref 0.7–1.7)
Albumin ELP: 2.8 g/dL — ABNORMAL LOW (ref 2.9–4.4)
Alpha-1-Globulin: 0.2 g/dL (ref 0.0–0.4)
Alpha-2-Globulin: 0.4 g/dL (ref 0.4–1.0)
Beta Globulin: 0.9 g/dL (ref 0.7–1.3)
Gamma Globulin: 1.2 g/dL (ref 0.4–1.8)
Globulin, Total: 2.8 g/dL (ref 2.2–3.9)
Total Protein ELP: 5.6 g/dL — ABNORMAL LOW (ref 6.0–8.5)

## 2021-09-17 NOTE — Telephone Encounter (Signed)
This RN contacted [facility] per follow up for visit with need for endoscopy due to noted abnormality on recent CT scan. ? ?Pt is presently scheduled for next available on 5/15 and is on the cancellation list per noted issue needing evaluation. ? ?MD made aware of above. ? ?Pt is scheduled with Dr Thornton Park at [place]. ?

## 2021-09-17 NOTE — Telephone Encounter (Signed)
This RN return VM left at 323 pm by the pt stating onset this afternoon of " feeling shaky and now very cold is this from the blood transfusion I received on Monday ?" ? ?Per phone discussion ( returned call at 445 pm post retrieving at 430 pm) pt states she is still feeling cold, shakiness " comes and goes ", temp is 97.5 ( taken per request of this RN while on the phone) , she has had 1 nose bleed this am, noted cough as well as she is " losing my voice" ? ?Note during phone discussion- pt's daughter was present and participated in discussion. ? ?This RN discussed above symptom are likely not related to her transfusion but more from her hemolytic anemia. ?This RN discussed with daughter and patient possible further drop in her heme and platelets may be occurring - as well as we do not know if she benefited from the transfusion. ? ?Per discussion pt declined present need to proceed to the ER per concerns and feels she can wait until AM to come into this office as scheduled. ? ?Note this RN did reiterate with patient and daughter if the patient's symptom worsen they need to call EMS promptly. Both the pt and daughter verbalized understanding. ? ?Pt will come in early for lab draw to allow for possible arrangement for transfusion if needed. ? ? ?

## 2021-09-18 ENCOUNTER — Inpatient Hospital Stay: Payer: Medicare Other

## 2021-09-18 ENCOUNTER — Inpatient Hospital Stay (HOSPITAL_BASED_OUTPATIENT_CLINIC_OR_DEPARTMENT_OTHER): Payer: Medicare Other | Admitting: Hematology and Oncology

## 2021-09-18 ENCOUNTER — Other Ambulatory Visit: Payer: Self-pay

## 2021-09-18 ENCOUNTER — Other Ambulatory Visit: Payer: Self-pay | Admitting: *Deleted

## 2021-09-18 ENCOUNTER — Encounter: Payer: Self-pay | Admitting: Hematology and Oncology

## 2021-09-18 VITALS — BP 132/63 | HR 116 | Temp 97.9°F | Resp 18 | Ht 65.0 in | Wt 324.0 lb

## 2021-09-18 DIAGNOSIS — R000 Tachycardia, unspecified: Secondary | ICD-10-CM | POA: Diagnosis not present

## 2021-09-18 DIAGNOSIS — D696 Thrombocytopenia, unspecified: Secondary | ICD-10-CM

## 2021-09-18 DIAGNOSIS — D649 Anemia, unspecified: Secondary | ICD-10-CM

## 2021-09-18 DIAGNOSIS — D599 Acquired hemolytic anemia, unspecified: Secondary | ICD-10-CM

## 2021-09-18 DIAGNOSIS — J189 Pneumonia, unspecified organism: Secondary | ICD-10-CM | POA: Diagnosis not present

## 2021-09-18 LAB — CBC WITH DIFFERENTIAL/PLATELET
Abs Immature Granulocytes: 0.02 10*3/uL (ref 0.00–0.07)
Basophils Absolute: 0.1 10*3/uL (ref 0.0–0.1)
Basophils Relative: 1 %
Eosinophils Absolute: 0.2 10*3/uL (ref 0.0–0.5)
Eosinophils Relative: 2 %
HCT: 26.8 % — ABNORMAL LOW (ref 36.0–46.0)
Hemoglobin: 8.6 g/dL — ABNORMAL LOW (ref 12.0–15.0)
Immature Granulocytes: 0 %
Lymphocytes Relative: 26 %
Lymphs Abs: 1.7 10*3/uL (ref 0.7–4.0)
MCH: 29.9 pg (ref 26.0–34.0)
MCHC: 32.1 g/dL (ref 30.0–36.0)
MCV: 93.1 fL (ref 80.0–100.0)
Monocytes Absolute: 0.6 10*3/uL (ref 0.1–1.0)
Monocytes Relative: 9 %
Neutro Abs: 4.2 10*3/uL (ref 1.7–7.7)
Neutrophils Relative %: 62 %
Platelets: 108 10*3/uL — ABNORMAL LOW (ref 150–400)
RBC: 2.88 MIL/uL — ABNORMAL LOW (ref 3.87–5.11)
RDW: 18.7 % — ABNORMAL HIGH (ref 11.5–15.5)
WBC: 6.8 10*3/uL (ref 4.0–10.5)
nRBC: 0 % (ref 0.0–0.2)

## 2021-09-18 LAB — COMPREHENSIVE METABOLIC PANEL
ALT: 23 U/L (ref 0–44)
AST: 53 U/L — ABNORMAL HIGH (ref 15–41)
Albumin: 3.3 g/dL — ABNORMAL LOW (ref 3.5–5.0)
Alkaline Phosphatase: 154 U/L — ABNORMAL HIGH (ref 38–126)
Anion gap: 6 (ref 5–15)
BUN: 15 mg/dL (ref 8–23)
CO2: 26 mmol/L (ref 22–32)
Calcium: 8.5 mg/dL — ABNORMAL LOW (ref 8.9–10.3)
Chloride: 107 mmol/L (ref 98–111)
Creatinine, Ser: 0.77 mg/dL (ref 0.44–1.00)
GFR, Estimated: >60 mL/min (ref >60)
Glucose, Bld: 100 mg/dL — ABNORMAL HIGH (ref 70–99)
Potassium: 3.9 mmol/L (ref 3.5–5.1)
Sodium: 139 mmol/L (ref 135–145)
Total Bilirubin: 1.1 mg/dL (ref 0.3–1.2)
Total Protein: 6.6 g/dL (ref 6.5–8.1)

## 2021-09-18 LAB — SURGICAL PATHOLOGY

## 2021-09-18 LAB — TSH: TSH: 2.564 u[IU]/mL (ref 0.350–4.500)

## 2021-09-18 LAB — COLD AGGLUTININ TITER: Cold Agglutinin Titer: NEGATIVE

## 2021-09-18 LAB — SAMPLE TO BLOOD BANK

## 2021-09-18 NOTE — Progress Notes (Signed)
[place] ?CONSULT NOTE ? ?Patient Care Team: ?Tawnya Crook, MD as PCP - General (Family Medicine) ? ?CHIEF COMPLAINTS/PURPOSE OF CONSULTATION:  ?Thrombocytopenia. ? ?ASSESSMENT & PLAN:  ? ?This is a pleasant 61 year old female patient with past medical history significant for neuropathy anxiety/depression referred to hematology for evaluation of progressive thrombocytopenia.   ? ?On evaluation of labs, she was found to have progressive anemia, nonimmune mediated hemolysis with reticulocyte count around 5%.  No elevated LDH or total bilirubin.  DAT test is negative, there appears to be some complement consumption.  Given no clear etiology for the nonimmune mediated hemolytic anemia, recommended imaging which showed hepatic cirrhosis and splenomegaly, diffuse thickening of the stomach concerning for infiltrative diseases such as lymphoma or inflammation.  Endoscopy was recommended.  We call gastroenterology to schedule this as soon as possible.  I have also sent an in basket message to Dr. Modena Nunnery. ?I have also recommended bone marrow aspiration and biopsy which was performed on Monday by Dr. Payton Mccallum.  Pathology from this is pending.  Have called Dr. Antonieta Pert will keep [place] informed. ?She tolerated packed red blood cells well.  She continues to feel very cold and fatigued.  She had 1 episode of epistaxis which has resolved.  I do not believe any of this is related to the blood transfusion. ?Weight gain has stalled at this time.  She is also scheduled to see cardiology on Monday. ?For now we will continue supportive care with labs twice a week.  She will return to clinic on Monday afternoon to see me followed by labs and as needed blood transfusion. ? ?HISTORY OF PRESENTING ILLNESS:  ?Theresa Moran 61 y.o. female is here because of thrombocytopenia ? ?This is a very pleasant 61 year old female patient with history of thrombocytopenia referred to hematology for evaluation and recommendations.   ? ?She  is here for follow-up.  Since last visit she continues to feel very fatigued, cold, had an episode of epistaxis, otherwise tolerated blood transfusion well.  She has not gained any more weight since last visit.  She is scheduled to see cardiology on Monday morning. ?She denies any bleeding symptoms.  Rest of the pertinent 10 point ROS reviewed and negative. ?MEDICAL HISTORY:  ?Past Medical History:  ?Diagnosis Date  ? Allergy   ? Anxiety   ? Asthma   ? in the past  ? Chronic pain   ? Depression   ? Gallstones   ? GERD (gastroesophageal reflux disease)   ? Headache   ? stopped after menopause  ? Neuromuscular disorder ([place])   ? Urticaria   ? ? ?SURGICAL HISTORY: ?Past Surgical History:  ?Procedure Laterality Date  ? APPENDECTOMY  1979  ? BREAST REDUCTION SURGERY    ? BREAST SURGERY  1980  ? breast reduction  ? CHOLECYSTECTOMY    ? NASAL SEPTUM SURGERY    ? ORIF ANKLE FRACTURE Left 03/26/2021  ? Procedure: OPEN REDUCTION INTERNAL FIXATION (ORIF) ANKLE FRACTURE;  Surgeon: Armond Hang, MD;  Location: [facility];  Service: Orthopedics;  Laterality: Left;  ? ? ?SOCIAL HISTORY: ?Social History  ? ?Socioeconomic History  ? Marital status: Married  ?  Spouse name: Not on file  ? Number of children: Not on file  ? Years of education: Not on file  ? Highest education level: Not on file  ?Occupational History  ? Not on file  ?Tobacco Use  ? Smoking status: Former  ?  Packs/day: 1.00  ?  Years: 15.00  ?  Pack years: 15.00  ?  Types: Cigarettes  ?  Quit date: 01/2004  ?  Years since quitting: 17.6  ? Smokeless tobacco: Never  ?Vaping Use  ? Vaping Use: Never used  ?Substance and Sexual Activity  ? Alcohol use: Not Currently  ? Drug use: No  ? Sexual activity: Not Currently  ?Other Topics Concern  ? Not on file  ?Social History Narrative  ? Has 1 child(artificial insem), 2 adopted, and 1 step from partner's former partner  ? Raising 4 grandchildren.  ? ?Social Determinants of Health  ? ?Financial Resource Strain: Not on file   ?Food Insecurity: Not on file  ?Transportation Needs: Not on file  ?Physical Activity: Not on file  ?Stress: Not on file  ?Social Connections: Not on file  ?Intimate Partner Violence: Not on file  ? ? ?FAMILY HISTORY: ?Family History  ?Problem Relation Age of Onset  ? Intellectual disability Mother   ? Hypertension Mother   ? Hyperlipidemia Mother   ? Depression Mother   ? Arthritis Mother   ? Mental illness Mother   ? Heart disease Father   ? Diabetes Father   ? Stroke Father   ? Heart disease Brother   ? COPD Brother   ? Mental illness Daughter   ? Learning disabilities Daughter   ? Diabetes Daughter   ? Allergic rhinitis Neg Hx   ? Angioedema Neg Hx   ? Asthma Neg Hx   ? Eczema Neg Hx   ? Immunodeficiency Neg Hx   ? Urticaria Neg Hx   ? ? ?ALLERGIES:  is allergic to shellfish allergy and shellfish-derived products. ? ?MEDICATIONS:  ?Current Outpatient Medications  ?Medication Sig Dispense Refill  ? amoxicillin-clavulanate (AUGMENTIN) 875-125 MG tablet Take 1 tablet by mouth 2 (two) times daily. 20 tablet 0  ? baclofen (LIORESAL) 10 MG tablet Take 10 mg by mouth daily.    ? busPIRone (BUSPAR) 10 MG tablet Take 1 tablet (10 mg total) by mouth 2 (two) times daily. 60 tablet 0  ? gabapentin (NEURONTIN) 600 MG tablet Take 600 mg by mouth at bedtime.    ? ibuprofen (ADVIL) 600 MG tablet Take 600 mg by mouth every 6 (six) hours as needed. 3 tablets daily    ? nortriptyline (PAMELOR) 50 MG capsule Take 100 capsules by mouth.    ? Tapentadol HCl 150 MG TB12 Take 150 mg by mouth daily.    ? ?No current facility-administered medications for this visit.  ? ? ? ?PHYSICAL EXAMINATION: ?ECOG PERFORMANCE STATUS: 0 - Asymptomatic ? ?Vitals:  ? 09/18/21 1106  ?BP: 132/63  ?Pulse: (!) 116  ?Resp: 18  ?Temp: 97.9 ?F (36.6 ?C)  ?SpO2: 96%  ? ?Filed Weights  ? 09/18/21 1106  ?Weight: (!) 324 lb (147 kg)  ? ?General appearance: Alert oriented and in no acute distress. ?Lower extremity swelling has improved compared to last  visit. ? ?LABORATORY DATA:  ?I have reviewed the data as listed ?Lab Results  ?Component Value Date  ? WBC 6.8 09/18/2021  ? HGB 8.6 (L) 09/18/2021  ? HCT 26.8 (L) 09/18/2021  ? MCV 93.1 09/18/2021  ? PLT 108 (L) 09/18/2021  ? ?  Chemistry   ?   ?Component Value Date/Time  ? NA 139 09/18/2021 1042  ? NA 141 12/18/2019 1506  ? K 3.9 09/18/2021 1042  ? CL 107 09/18/2021 1042  ? CO2 26 09/18/2021 1042  ? BUN 15 09/18/2021 1042  ? BUN 16 12/18/2019 1506  ? CREATININE  0.77 09/18/2021 1042  ? CREATININE 1.09 (H) 12/24/2015 1649  ?    ?Component Value Date/Time  ? CALCIUM 8.5 (L) 09/18/2021 1042  ? ALKPHOS 154 (H) 09/18/2021 1042  ? AST 53 (H) 09/18/2021 1042  ? ALT 23 09/18/2021 1042  ? BILITOT 1.1 09/18/2021 1042  ? BILITOT 0.4 12/18/2019 1506  ?  ? ?I reviewed the labs today with the patient.  No indication for blood transfusion today. ?Hemoglobin today at 8.6.  Slowly progressive nonimmune mediated hemolysis.  Hemoglobin at baseline was around 14 g/dL.  Mild thrombocytopenia with platelet count ranging from 100 220,000. ?PNH panel normal ?DAT negative ?SPEP without any evidence of myeloma. ?Complete complement consumption noted ?Cold agglutinin titer pending. ?Immunoglobulin levels are unremarkable except for elevated IgA. ? ?RADIOGRAPHIC STUDIES: ?I have personally reviewed the radiological images as listed and agreed with the findings in the report. ?CT CHEST ABDOMEN PELVIS W CONTRAST ? ?Result Date: 09/12/2021 ?CLINICAL DATA:  New onset hemolytic anemia. EXAM: CT CHEST, ABDOMEN, AND PELVIS WITH CONTRAST TECHNIQUE: Multidetector CT imaging of the chest, abdomen and pelvis was performed following the standard protocol during bolus administration of intravenous contrast. RADIATION DOSE REDUCTION: This exam was performed according to the departmental dose-optimization program which includes automated exposure control, adjustment of the mA and/or kV according to patient size and/or use of iterative reconstruction  technique. CONTRAST:  190m OMNIPAQUE IOHEXOL 300 MG/ML  SOLN COMPARISON:  None. FINDINGS: CT CHEST FINDINGS Cardiovascular: Atherosclerosis of thoracic aorta is noted without aneurysm formation. Normal cardiac size. No pe

## 2021-09-19 ENCOUNTER — Other Ambulatory Visit: Payer: Self-pay

## 2021-09-19 ENCOUNTER — Ambulatory Visit (INDEPENDENT_AMBULATORY_CARE_PROVIDER_SITE_OTHER): Payer: Medicare Other | Admitting: Gastroenterology

## 2021-09-19 ENCOUNTER — Other Ambulatory Visit (INDEPENDENT_AMBULATORY_CARE_PROVIDER_SITE_OTHER): Payer: Medicare Other

## 2021-09-19 ENCOUNTER — Encounter: Payer: Self-pay | Admitting: Gastroenterology

## 2021-09-19 ENCOUNTER — Telehealth: Payer: Self-pay

## 2021-09-19 VITALS — BP 140/62 | HR 119 | Ht 65.0 in | Wt 324.0 lb

## 2021-09-19 DIAGNOSIS — K7460 Unspecified cirrhosis of liver: Secondary | ICD-10-CM

## 2021-09-19 DIAGNOSIS — R933 Abnormal findings on diagnostic imaging of other parts of digestive tract: Secondary | ICD-10-CM

## 2021-09-19 DIAGNOSIS — D696 Thrombocytopenia, unspecified: Secondary | ICD-10-CM | POA: Diagnosis not present

## 2021-09-19 LAB — IBC + FERRITIN
Ferritin: 25.2 ng/mL (ref 10.0–291.0)
Iron: 32 ug/dL — ABNORMAL LOW (ref 42–145)
Saturation Ratios: 8.7 % — ABNORMAL LOW (ref 20.0–50.0)
TIBC: 368.2 ug/dL (ref 250.0–450.0)
Transferrin: 263 mg/dL (ref 212.0–360.0)

## 2021-09-19 LAB — GLUCOSE, RANDOM: Glucose, Bld: 91 mg/dL (ref 70–99)

## 2021-09-19 NOTE — Telephone Encounter (Signed)
-----   Message from Horris Latino, [place] sent at 09/19/2021  9:05 AM EDT ----- ?Regarding: needs EGD at [facility] ?Needs EGD at [facility] for BMI of 53 % and abnormal CT of stomach. Needs ASAP. Per Dr. Tarri Glenn will do a 7:30 if she needs to just ask her first since her husband does not have a car. ? ?Happy Friday ?Heather ? ?

## 2021-09-19 NOTE — Telephone Encounter (Signed)
Called Endo unit to determine first available date/time/location for procedure. Pt scheduled @ [facility]  on 10/06/21 @ 730am, arrival time 6am. Called pt to make her aware and is agreeable to date/time/location for procedure. Prep instructions sent via My Chart per pt request. Amb referral placed for auth purposes. ?

## 2021-09-19 NOTE — Progress Notes (Signed)
? ?Referring Provider: Benay Pike, MD ?Primary Care Physician:  Tawnya Crook, MD ? ? ?Reason for Consultation:  Abnormal stomach on imaging ? ? ?IMPRESSION:  ?Abnormal imaging of the stomach: Concern given radiographic appearance is for possible infiltrative disease.  However, there are no associated symptoms.  EGD recommended.  This will need to be performed at the hospital which limits our availability.  Ideally this would be performed soon after one of her scheduled lab draws on Mondays or Thursdays to have up-to-date information regarding her hemoglobin and platelets at the time of endoscopic evaluation.  I have asked the staff to work on getting this scheduled ASAP.  Ms. Boutelle notes that she is flexible and can be available for last-minute cancellations. ? ?New diagnosis of cirrhosis with splenomegaly and thrombocytopenia based on CT scan findings.  No history of prior liver disease, stigmata of chronic liver disease on exam, or prior abdominal imaging for comparison.  Recommend serologic evaluation for common causes of chronic liver disease.  Recommend MRI for further evaluation. ? ?Progressive normocytic anemia with a hemoglobin of 8.6 requiring blood transfusions.  There is no overt GI blood loss.  Last colonoscopy 2012.  Cologuard -2023.  May need to consider colonoscopy in the future. ? ?BMI 54. No known insulin resistance, diabetes, or hyperlipidemia.  ? ?Intermittent pharyngeal dysphagia.  Symptoms are rare and she does not feel additional evaluation is needed at this time. ? ?PLAN: ?- EGD with biopsies to exclude infiltrative disease to be performed at the hospital ?- Hepatitis B surface antigen, hepatitis B core antibody, fasting ferritin, fasting insulin, fasting glucose, iron, ANA, AMA, anti-smooth muscle antibody, IgG, IgM, FibroSURE ?- MRI of the liver to further evaluate the cirrhosis ? ? ?HPI: Theresa Moran is a 61 y.o. female referred by Dr. Chryl Heck for abnormal imaging of the stomach.   The history is obtained through the patient, her wife who accompanies her to this appointment, and review of her electronic health record. 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   She has a history of neuropathy, anxiety, depression, and chronic regional pain syndrome that ultimately led to disability.  She had a distant cholecystectomy for cholelithiasis and appendectomy. She has been under evaluation by Dr. Chryl Heck for progressive anemia and nonimmune mediated hemolysis. She required 2 units of blood on Friday. She is having labs drawn twice a week on Mondays and Thursdays to determine if she needs more blood products. Given no clear etiology for nonimmune mediated hemolytic anemia including bone marrow biopsy, imaging was recommended. ? ?CT scan of the chest abdomen and pelvis 09/11/2021 showed: ?- 4 mm subpleural nodule seen in right middle lobe. No follow-up ?needed if patient is low-risk. ?- Findings consistent with hepatic cirrhosis. Moderate splenomegaly is ?noted. ? - Mild gastric distention is noted with either a large amount of food ?debris or severe wall thickening involving the body and distal ?portion of the stomach concerning for inflammation or infiltrative ?disease such as lymphoma. ? - Aortic Atherosclerosis   ? ?No prior abdominal imaging for comparison. ? ?No GI symptoms. No abdominal pain, nausea, heartburn, change in bowel habits. She has a daily bowel movement. No blood or mucous in the stool. She intermittently has to concentrate to initiate the swallow. No symptoms of esophageal dysphagia or odynophagia.  ? ?No knowledge of liver disease. However, she has been on opioids for 17 years for chronic regional pain syndrome and wonders if the pain medications have damaged her liver.  No prior abdominal  imaging. Multiple blood donations starting at age 29. No history of jaundice or scleral icterus. No history of acute hepatitis.  ? ?Screening colonoscopy with Dr. Olevia Perches in 2012 showed diverticulosis, a  lipoma, and a small prolapse-type polyp.  ?Cologuard was negative 3/23 ?No prior upper endoscopy ? ?Labs prior to surgery in the fall was normal except for the platelets. Platelets have been low since 2022 at 119, and were on the low end of normal as far back as 2017. Hemoglobin normal until March.  ? ?Labs 08/20/2021: Hepatitis C antibody negative ?Labs 09/06/10: LDH 174 ?Labs 09/18/2021 show a sodium of 139, creatinine 0.77, total bilirubin 1.1, AST 53, ALT 23, alk phos 154, albumin 3.3, hemoglobin 8.6, MCV 93.1, RDW 18.7, platelets 108 ?Bone marrow biopsy 09/15/21: hypercellular marrow with erythroid hyperplasia ? ?There is no known family history of colon cancer or polyps. No family history of stomach cancer or other GI malignancy. No family history of inflammatory bowel disease or celiac.  ? ? ?Past Medical History:  ?Diagnosis Date  ? Allergy   ? Anxiety   ? Asthma   ? in the past  ? Chronic pain   ? Depression   ? Gallstones   ? GERD (gastroesophageal reflux disease)   ? Headache   ? stopped after menopause  ? Neuromuscular disorder ([place])   ? Urticaria   ? ? ?Past Surgical History:  ?Procedure Laterality Date  ? APPENDECTOMY  1979  ? BREAST REDUCTION SURGERY    ? BREAST SURGERY  1980  ? breast reduction  ? CHOLECYSTECTOMY    ? NASAL SEPTUM SURGERY    ? ORIF ANKLE FRACTURE Left 03/26/2021  ? Procedure: OPEN REDUCTION INTERNAL FIXATION (ORIF) ANKLE FRACTURE;  Surgeon: Armond Hang, MD;  Location: [place];  Service: Orthopedics;  Laterality: Left;  ? ?Current Outpatient Medications  ?Medication Sig Dispense Refill  ? baclofen (LIORESAL) 10 MG tablet Take 10 mg by mouth daily.    ? busPIRone (BUSPAR) 10 MG tablet Take 1 tablet (10 mg total) by mouth 2 (two) times daily. 60 tablet 0  ? gabapentin (NEURONTIN) 600 MG tablet Take 600 mg by mouth at bedtime.    ? ibuprofen (ADVIL) 600 MG tablet Take 600 mg by mouth every 6 (six) hours as needed. 3 tablets daily    ? nortriptyline (PAMELOR) 50 MG capsule Take 100  capsules by mouth.    ? Tapentadol HCl 150 MG TB12 Take 150 mg by mouth daily.    ? ?No current facility-administered medications for this visit.  ? ? ?Allergies as of 09/19/2021 - Review Complete 09/19/2021  ?Allergen Reaction Noted  ? Shellfish allergy Anaphylaxis, Hives, Itching, Shortness Of Breath, and Swelling 12/18/2019  ? Shellfish-derived products  09/11/2010  ? ? ?Family History  ?Problem Relation Age of Onset  ? Intellectual disability Mother   ? Hypertension Mother   ? Hyperlipidemia Mother   ? Depression Mother   ? Arthritis Mother   ? Mental illness Mother   ? Heart disease Father   ? Diabetes Father   ? Stroke Father   ? Heart disease Brother   ? COPD Brother   ? Mental illness Daughter   ? Learning disabilities Daughter   ? Diabetes Daughter   ? Allergic rhinitis Neg Hx   ? Angioedema Neg Hx   ? Asthma Neg Hx   ? Eczema Neg Hx   ? Immunodeficiency Neg Hx   ? Urticaria Neg Hx   ? Colon cancer Neg Hx   ?  Pancreatic cancer Neg Hx   ? Liver cancer Neg Hx   ? Stomach cancer Neg Hx   ? ? ?Social History  ? ?Socioeconomic History  ? Marital status: Married  ?  Spouse name: Not on file  ? Number of children: Not on file  ? Years of education: Not on file  ? Highest education level: Not on file  ?Occupational History  ? Not on file  ?Tobacco Use  ? Smoking status: Former  ?  Packs/day: 1.00  ?  Years: 15.00  ?  Pack years: 15.00  ?  Types: Cigarettes  ?  Quit date: 01/2004  ?  Years since quitting: 17.6  ? Smokeless tobacco: Never  ?Vaping Use  ? Vaping Use: Never used  ?Substance and Sexual Activity  ? Alcohol use: Not Currently  ? Drug use: No  ? Sexual activity: Not Currently  ?Other Topics Concern  ? Not on file  ?Social History Narrative  ? Has 1 child(artificial insem), 2 adopted, and 1 step from partner's former partner  ? Raising 4 grandchildren.  ? ?Social Determinants of Health  ? ?Financial Resource Strain: Not on file  ?Food Insecurity: Not on file  ?Transportation Needs: Not on file  ?Physical  Activity: Not on file  ?Stress: Not on file  ?Social Connections: Not on file  ?Intimate Partner Violence: Not on file  ? ? ?Review of Systems: ?12 system ROS is negative except as noted above.  ? ?Physical Exam: ?

## 2021-09-19 NOTE — Telephone Encounter (Signed)
I can perform this hospital EGD in the May 16 slot. Given the possible gastric food debris noted on CT please have the patient stay on clear liquids on the day prior to her EGD.  ?

## 2021-09-19 NOTE — Patient Instructions (Addendum)
It was my pleasure to provide care to you today. Based on our discussion, I am providing you with my recommendations below: ? ?RECOMMENDATION(S):  ? ?IMAGING: ? ?You have been scheduled for an MRI at [facility] on Thursday 09/25/21. Your appointment time is 7 pm. Please arrive to admitting (at main entrance of the hospital) 15 minutes prior to your appointment time for registration purposes. Please make certain not to have anything to eat or drink 4 hours prior to your test. In addition, if you have any metal in your body, have a pacemaker or defibrillator, please be sure to let your ordering physician know. This test typically takes 45 minutes to 1 hour to complete. Should you need to reschedule, please call [phone] to do so. ? ?LABS:  ? ?Please proceed to the basement level for lab work before leaving today. Press "B" on the elevator. The lab is located at the first door on the left as you exit the elevator. ? ?ENDOSCOPY:  ? ?You have been scheduled for an endoscopy. Please follow written instructions given to you at your visit today. ? ?INHALERS:  ? ?If you use inhalers (even only as needed), please bring them with you on the day of your procedure. ? ?FOLLOW UP: ? ?After your procedure, you will receive a call from my office staff regarding my recommendation for follow up. ? ?BMI: ? ?If you are age 63 or younger, your body mass index should be between 19-25. Your Body mass index is 63.92 kg/m?Marland Kitchen If this is out of the aformentioned range listed, please consider follow up with your Primary Care Provider.  ? ?MY CHART: ? ?The [place] [facility] providers would like to encourage you to use [facility] to communicate with providers for non-urgent requests or questions.  Due to long hold times on the telephone, sending your provider a message by [facility] may be a faster and more efficient way to get a response.  Please allow 48 business hours for a response.  Please remember that this is for non-urgent requests.   ? ?Thank you for trusting me with your gastrointestinal care!   ? ?Th[place]ton [place], MD, MPH ? ?

## 2021-09-21 ENCOUNTER — Encounter (HOSPITAL_COMMUNITY): Payer: Self-pay

## 2021-09-21 ENCOUNTER — Other Ambulatory Visit: Payer: Self-pay

## 2021-09-21 ENCOUNTER — Emergency Department (HOSPITAL_COMMUNITY): Payer: Medicare Other

## 2021-09-21 ENCOUNTER — Other Ambulatory Visit (HOSPITAL_COMMUNITY): Payer: Medicare Other

## 2021-09-21 ENCOUNTER — Inpatient Hospital Stay (HOSPITAL_COMMUNITY)
Admission: EM | Admit: 2021-09-21 | Discharge: 2021-09-28 | DRG: 194 | Disposition: A | Discharge status: Home / Self Care | Payer: Medicare Other | Attending: Internal Medicine | Admitting: Internal Medicine

## 2021-09-21 DIAGNOSIS — F419 Anxiety disorder, unspecified: Secondary | ICD-10-CM | POA: Diagnosis present

## 2021-09-21 DIAGNOSIS — R1310 Dysphagia, unspecified: Secondary | ICD-10-CM

## 2021-09-21 DIAGNOSIS — K3189 Other diseases of stomach and duodenum: Secondary | ICD-10-CM | POA: Diagnosis not present

## 2021-09-21 DIAGNOSIS — D508 Other iron deficiency anemias: Secondary | ICD-10-CM

## 2021-09-21 DIAGNOSIS — R600 Localized edema: Secondary | ICD-10-CM | POA: Diagnosis present

## 2021-09-21 DIAGNOSIS — Z20822 Contact with and (suspected) exposure to covid-19: Secondary | ICD-10-CM | POA: Diagnosis present

## 2021-09-21 DIAGNOSIS — Z833 Family history of diabetes mellitus: Secondary | ICD-10-CM

## 2021-09-21 DIAGNOSIS — K648 Other hemorrhoids: Secondary | ICD-10-CM | POA: Diagnosis present

## 2021-09-21 DIAGNOSIS — J189 Pneumonia, unspecified organism: Principal | ICD-10-CM | POA: Diagnosis present

## 2021-09-21 DIAGNOSIS — D509 Iron deficiency anemia, unspecified: Secondary | ICD-10-CM | POA: Diagnosis present

## 2021-09-21 DIAGNOSIS — R000 Tachycardia, unspecified: Secondary | ICD-10-CM

## 2021-09-21 DIAGNOSIS — R6510 Systemic inflammatory response syndrome (SIRS) of non-infectious origin without acute organ dysfunction: Secondary | ICD-10-CM | POA: Diagnosis present

## 2021-09-21 DIAGNOSIS — K2990 Gastroduodenitis, unspecified, without bleeding: Secondary | ICD-10-CM | POA: Diagnosis present

## 2021-09-21 DIAGNOSIS — D696 Thrombocytopenia, unspecified: Secondary | ICD-10-CM | POA: Diagnosis present

## 2021-09-21 DIAGNOSIS — K2950 Unspecified chronic gastritis without bleeding: Secondary | ICD-10-CM | POA: Diagnosis present

## 2021-09-21 DIAGNOSIS — D123 Benign neoplasm of transverse colon: Secondary | ICD-10-CM | POA: Diagnosis present

## 2021-09-21 DIAGNOSIS — F32A Depression, unspecified: Secondary | ICD-10-CM | POA: Diagnosis present

## 2021-09-21 DIAGNOSIS — I8510 Secondary esophageal varices without bleeding: Secondary | ICD-10-CM | POA: Diagnosis present

## 2021-09-21 DIAGNOSIS — D649 Anemia, unspecified: Secondary | ICD-10-CM

## 2021-09-21 DIAGNOSIS — Z6841 Body Mass Index (BMI) 40.0 and over, adult: Secondary | ICD-10-CM | POA: Diagnosis not present

## 2021-09-21 DIAGNOSIS — I959 Hypotension, unspecified: Secondary | ICD-10-CM | POA: Diagnosis not present

## 2021-09-21 DIAGNOSIS — Z87891 Personal history of nicotine dependence: Secondary | ICD-10-CM

## 2021-09-21 DIAGNOSIS — K7460 Unspecified cirrhosis of liver: Secondary | ICD-10-CM | POA: Diagnosis not present

## 2021-09-21 DIAGNOSIS — T502X5A Adverse effect of carbonic-anhydrase inhibitors, benzothiadiazides and other diuretics, initial encounter: Secondary | ICD-10-CM | POA: Diagnosis not present

## 2021-09-21 DIAGNOSIS — Z91013 Allergy to seafood: Secondary | ICD-10-CM

## 2021-09-21 DIAGNOSIS — D599 Acquired hemolytic anemia, unspecified: Secondary | ICD-10-CM

## 2021-09-21 DIAGNOSIS — I272 Pulmonary hypertension, unspecified: Secondary | ICD-10-CM | POA: Diagnosis present

## 2021-09-21 DIAGNOSIS — D589 Hereditary hemolytic anemia, unspecified: Secondary | ICD-10-CM | POA: Diagnosis present

## 2021-09-21 DIAGNOSIS — Z8249 Family history of ischemic heart disease and other diseases of the circulatory system: Secondary | ICD-10-CM

## 2021-09-21 DIAGNOSIS — E8779 Other fluid overload: Secondary | ICD-10-CM | POA: Diagnosis present

## 2021-09-21 DIAGNOSIS — K635 Polyp of colon: Secondary | ICD-10-CM | POA: Diagnosis not present

## 2021-09-21 DIAGNOSIS — R188 Other ascites: Secondary | ICD-10-CM | POA: Diagnosis present

## 2021-09-21 DIAGNOSIS — R0609 Other forms of dyspnea: Secondary | ICD-10-CM | POA: Diagnosis not present

## 2021-09-21 DIAGNOSIS — N179 Acute kidney failure, unspecified: Secondary | ICD-10-CM | POA: Diagnosis not present

## 2021-09-21 DIAGNOSIS — Z83438 Family history of other disorder of lipoprotein metabolism and other lipidemia: Secondary | ICD-10-CM

## 2021-09-21 DIAGNOSIS — D126 Benign neoplasm of colon, unspecified: Secondary | ICD-10-CM | POA: Diagnosis not present

## 2021-09-21 DIAGNOSIS — D175 Benign lipomatous neoplasm of intra-abdominal organs: Secondary | ICD-10-CM | POA: Diagnosis present

## 2021-09-21 DIAGNOSIS — F329 Major depressive disorder, single episode, unspecified: Secondary | ICD-10-CM | POA: Diagnosis present

## 2021-09-21 DIAGNOSIS — K2970 Gastritis, unspecified, without bleeding: Secondary | ICD-10-CM | POA: Diagnosis not present

## 2021-09-21 DIAGNOSIS — K5730 Diverticulosis of large intestine without perforation or abscess without bleeding: Secondary | ICD-10-CM | POA: Diagnosis present

## 2021-09-21 DIAGNOSIS — Z8261 Family history of arthritis: Secondary | ICD-10-CM

## 2021-09-21 DIAGNOSIS — G629 Polyneuropathy, unspecified: Secondary | ICD-10-CM | POA: Diagnosis present

## 2021-09-21 DIAGNOSIS — Z823 Family history of stroke: Secondary | ICD-10-CM

## 2021-09-21 DIAGNOSIS — G894 Chronic pain syndrome: Secondary | ICD-10-CM | POA: Diagnosis present

## 2021-09-21 DIAGNOSIS — K766 Portal hypertension: Secondary | ICD-10-CM | POA: Diagnosis present

## 2021-09-21 DIAGNOSIS — Z79899 Other long term (current) drug therapy: Secondary | ICD-10-CM

## 2021-09-21 HISTORY — DX: Tachycardia, unspecified: R00.0

## 2021-09-21 HISTORY — DX: Systemic inflammatory response syndrome (sirs) of non-infectious origin without acute organ dysfunction: R65.10

## 2021-09-21 HISTORY — DX: Pneumonia, unspecified organism: J18.9

## 2021-09-21 HISTORY — DX: Unspecified cirrhosis of liver: K74.60

## 2021-09-21 LAB — TYPE AND SCREEN
ABO/RH(D): O NEG
Antibody Screen: NEGATIVE

## 2021-09-21 LAB — COMPREHENSIVE METABOLIC PANEL
ALT: 23 U/L (ref 0–44)
AST: 51 U/L — ABNORMAL HIGH (ref 15–41)
Albumin: 2.8 g/dL — ABNORMAL LOW (ref 3.5–5.0)
Alkaline Phosphatase: 141 U/L — ABNORMAL HIGH (ref 38–126)
Anion gap: 7 (ref 5–15)
BUN: 9 mg/dL (ref 8–23)
CO2: 23 mmol/L (ref 22–32)
Calcium: 8.4 mg/dL — ABNORMAL LOW (ref 8.9–10.3)
Chloride: 106 mmol/L (ref 98–111)
Creatinine, Ser: 0.77 mg/dL (ref 0.44–1.00)
GFR, Estimated: >60 mL/min (ref >60)
Glucose, Bld: 114 mg/dL — ABNORMAL HIGH (ref 70–99)
Potassium: 3.9 mmol/L (ref 3.5–5.1)
Sodium: 136 mmol/L (ref 135–145)
Total Bilirubin: 1.1 mg/dL (ref 0.3–1.2)
Total Protein: 6.3 g/dL — ABNORMAL LOW (ref 6.5–8.1)

## 2021-09-21 LAB — CBC WITH DIFFERENTIAL/PLATELET
Abs Immature Granulocytes: 0.04 10*3/uL (ref 0.00–0.07)
Basophils Absolute: 0.1 10*3/uL (ref 0.0–0.1)
Basophils Relative: 1 %
Eosinophils Absolute: 0.2 10*3/uL (ref 0.0–0.5)
Eosinophils Relative: 2 %
HCT: 26.9 % — ABNORMAL LOW (ref 36.0–46.0)
Hemoglobin: 8.5 g/dL — ABNORMAL LOW (ref 12.0–15.0)
Immature Granulocytes: 0 %
Lymphocytes Relative: 23 %
Lymphs Abs: 2.4 10*3/uL (ref 0.7–4.0)
MCH: 30.1 pg (ref 26.0–34.0)
MCHC: 31.6 g/dL (ref 30.0–36.0)
MCV: 95.4 fL (ref 80.0–100.0)
Monocytes Absolute: 1.2 10*3/uL — ABNORMAL HIGH (ref 0.1–1.0)
Monocytes Relative: 12 %
Neutro Abs: 6.5 10*3/uL (ref 1.7–7.7)
Neutrophils Relative %: 62 %
Platelets: 130 10*3/uL — ABNORMAL LOW (ref 150–400)
RBC: 2.82 MIL/uL — ABNORMAL LOW (ref 3.87–5.11)
RDW: 17.9 % — ABNORMAL HIGH (ref 11.5–15.5)
WBC: 10.4 10*3/uL (ref 4.0–10.5)
nRBC: 0 % (ref 0.0–0.2)

## 2021-09-21 LAB — LACTIC ACID, PLASMA
Lactic Acid, Venous: 1.2 mmol/L (ref 0.5–1.9)
Lactic Acid, Venous: 1.3 mmol/L (ref 0.5–1.9)

## 2021-09-21 LAB — PROCALCITONIN: Procalcitonin: 0.16 ng/mL

## 2021-09-21 LAB — RESP PANEL BY RT-PCR (FLU A&B, COVID) ARPGX2
Influenza A by PCR: NEGATIVE
Influenza B by PCR: NEGATIVE
SARS Coronavirus 2 by RT PCR: NEGATIVE

## 2021-09-21 LAB — BRAIN NATRIURETIC PEPTIDE: B Natriuretic Peptide: 39.6 pg/mL (ref 0.0–100.0)

## 2021-09-21 MED ORDER — FUROSEMIDE 10 MG/ML IJ SOLN
20.0000 mg | Freq: Every day | INTRAMUSCULAR | Status: DC
  Administered 2021-09-21 – 2021-09-22 (×2): 20 mg via INTRAVENOUS
  Filled 2021-09-21 (×2): qty 2

## 2021-09-21 MED ORDER — SODIUM CHLORIDE 0.9 % IV SOLN
500.0000 mg | INTRAVENOUS | Status: AC
  Administered 2021-09-22 – 2021-09-25 (×4): 500 mg via INTRAVENOUS
  Filled 2021-09-21 (×4): qty 5

## 2021-09-21 MED ORDER — ACETAMINOPHEN 650 MG RE SUPP: 650.0000 mg | Freq: Four times a day (QID) | RECTAL | Status: DC | PRN

## 2021-09-21 MED ORDER — ACETAMINOPHEN 325 MG PO TABS: 650.0000 mg | ORAL_TABLET | Freq: Four times a day (QID) | ORAL | Status: DC | PRN

## 2021-09-21 MED ORDER — SODIUM CHLORIDE 0.9 % IV SOLN
1.0000 g | Freq: Once | INTRAVENOUS | Status: AC
  Administered 2021-09-21: 1 g via INTRAVENOUS
  Filled 2021-09-21: qty 10

## 2021-09-21 MED ORDER — IOHEXOL 350 MG/ML SOLN
100.0000 mL | Freq: Once | INTRAVENOUS | Status: AC | PRN
  Administered 2021-09-21: 100 mL via INTRAVENOUS

## 2021-09-21 MED ORDER — NORTRIPTYLINE HCL 25 MG PO CAPS
100.0000 mg | ORAL_CAPSULE | Freq: Every day | ORAL | Status: DC
  Administered 2021-09-21 – 2021-09-27 (×7): 100 mg via ORAL
  Filled 2021-09-21 (×7): qty 4

## 2021-09-21 MED ORDER — ALBUTEROL SULFATE (2.5 MG/3ML) 0.083% IN NEBU: 2.5000 mg | INHALATION_SOLUTION | RESPIRATORY_TRACT | Status: DC | PRN

## 2021-09-21 MED ORDER — SODIUM CHLORIDE 0.9% FLUSH
3.0000 mL | Freq: Two times a day (BID) | INTRAVENOUS | Status: DC
  Administered 2021-09-21 – 2021-09-28 (×14): 3 mL via INTRAVENOUS

## 2021-09-21 MED ORDER — GABAPENTIN 600 MG PO TABS
600.0000 mg | ORAL_TABLET | Freq: Two times a day (BID) | ORAL | Status: DC
Start: 2021-09-21 — End: 2021-09-28
  Administered 2021-09-21 – 2021-09-28 (×15): 600 mg via ORAL
  Filled 2021-09-21 (×16): qty 1

## 2021-09-21 MED ORDER — SODIUM CHLORIDE 0.9 % IV SOLN
2.0000 g | INTRAVENOUS | Status: AC
  Administered 2021-09-22 – 2021-09-25 (×4): 2 g via INTRAVENOUS
  Filled 2021-09-21 (×4): qty 20

## 2021-09-21 MED ORDER — POTASSIUM CHLORIDE CRYS ER 20 MEQ PO TBCR
40.0000 meq | EXTENDED_RELEASE_TABLET | Freq: Once | ORAL | Status: AC
  Administered 2021-09-21: 40 meq via ORAL
  Filled 2021-09-21: qty 2

## 2021-09-21 MED ORDER — TAPENTADOL HCL ER 50 MG PO TB12
50.0000 mg | ORAL_TABLET | Freq: Every day | ORAL | Status: DC
  Administered 2021-09-21 – 2021-09-22 (×2): 50 mg via ORAL
  Filled 2021-09-21 (×2): qty 1

## 2021-09-21 MED ORDER — BUSPIRONE HCL 5 MG PO TABS
10.0000 mg | ORAL_TABLET | Freq: Two times a day (BID) | ORAL | Status: DC
  Administered 2021-09-21 – 2021-09-28 (×15): 10 mg via ORAL
  Filled 2021-09-21 (×7): qty 2
  Filled 2021-09-21: qty 1
  Filled 2021-09-21 (×7): qty 2

## 2021-09-21 MED ORDER — SODIUM CHLORIDE 0.9 % IV SOLN
500.0000 mg | Freq: Once | INTRAVENOUS | Status: AC
  Administered 2021-09-21: 500 mg via INTRAVENOUS
  Filled 2021-09-21: qty 5

## 2021-09-21 MED ORDER — BACLOFEN 10 MG PO TABS
10.0000 mg | ORAL_TABLET | Freq: Two times a day (BID) | ORAL | Status: DC
  Administered 2021-09-21 – 2021-09-28 (×15): 10 mg via ORAL
  Filled 2021-09-21 (×16): qty 1

## 2021-09-21 MED ORDER — GUAIFENESIN ER 600 MG PO TB12
600.0000 mg | ORAL_TABLET | Freq: Two times a day (BID) | ORAL | Status: DC
  Administered 2021-09-21 – 2021-09-28 (×15): 600 mg via ORAL
  Filled 2021-09-21 (×15): qty 1

## 2021-09-21 NOTE — ED Notes (Signed)
Patient given ice chips per request.

## 2021-09-21 NOTE — ED Provider Notes (Signed)
Pt signed out by Dr. Karle Starch at shift change. ? ?CT chest: ? ?  ?IMPRESSION:  ?1. Vascular contrast bolus had to be repeated. No pulmonary embolus  ?identified.  ?   ?2. Lower lung volumes from 10 days ago with small new layering  ?pleural effusions and new with multifocal bilateral lower lobe and  ?right middle lobe opacity most resembling Multifocal Pneumonia.  ?   ?3. Cirrhosis.  Calcified coronary artery atherosclerosis.  ? ?Films reviewed by me.  I agree with the radiologist reading. ? ?Pt given Rocephin and Zithromax earlier this morning.   ? ?Pt remains tachycardia, so I put her on 2L oxygen via [place] for comfort.   ? ?Pt d/w Dr. Tamala Julian (triad) for admission. ?  ?Theresa Pence, MD ?09/21/21 0845 ? ?

## 2021-09-21 NOTE — ED Notes (Signed)
Patient assisted to and off BSC. ?

## 2021-09-21 NOTE — Plan of Care (Signed)
  Problem: Activity: Goal: Ability to tolerate increased activity will improve Outcome: Progressing   Problem: Clinical Measurements: Goal: Ability to maintain a body temperature in the normal range will improve Outcome: Progressing   Problem: Respiratory: Goal: Ability to maintain adequate ventilation will improve Outcome: Progressing Goal: Ability to maintain a clear airway will improve Outcome: Progressing   

## 2021-09-21 NOTE — ED Provider Notes (Signed)
? ?[facility]  ?Provider Note ? ?CSN: [id number] ?Arrival date & time: 09/21/21 0038 ? ?History ?Chief Complaint  ?Patient presents with  ? Shortness of Breath  ?  BIBA from home, past few weeks Hgb and Hct have been low. Had X2 blood transfusions at [facility], pt states scheduled for blood transfusion on Monday. Productive cough X 3 days  ? ? ?Theresa Moran is a 61 y.o. female brought to the ED via EMS for evaluation of SOB and tachycardia. She has had a recent diagnosis of anemia and thrombocytopenia currently in the process of workup with hematology. She had a bone marrow biopsy last week. Has required transfusion about a week ago. She had CT imaging concerning for cirrhosis recently and gastric infiltrative disease, saw GI and scheduled for EGD in 2 weeks. Also scheduled to see Cardiology and Hematology again Monday. She has had a dry cough for the last few days, but did have some bloody mucous today. She has been feeling more SOB and has had elevated HR at rest. She denies chest pains. She has had increased LE edema and weight gain in the last few weeks as well. She has not had a fever at home, but EMS reported temp of 101F.  ? ? ?Home Medications ?Prior to Admission medications   ?Medication Sig Start Date End Date Taking? Authorizing Provider  ?baclofen (LIORESAL) 10 MG tablet Take 10 mg by mouth 2 (two) times daily.   Yes [provider]  ?busPIRone (BUSPAR) 10 MG tablet Take 1 tablet (10 mg total) by mouth 2 (two) times daily. 02/12/20  Yes Jacelyn Pi, Lilia Argue, MD  ?gabapentin (NEURONTIN) 600 MG tablet Take 600 mg by mouth 2 (two) times daily.   Yes [provider]  ?ibuprofen (ADVIL) 600 MG tablet Take 600 mg by mouth every 6 (six) hours as needed for headache or moderate pain.   Yes [provider]  ?NUCYNTA ER 50 MG 12 hr tablet Take 50 mg by mouth daily. 09/12/21  Yes [provider]  ?nortriptyline (PAMELOR) 50 MG capsule Take 100  capsules by mouth at bedtime. 08/27/21 11/25/21  [provider]  ? ? ? ?Allergies    ?Shellfish allergy and Shellfish-derived products ? ? ?Review of Systems   ?Review of Systems ?Please see HPI for pertinent positives and negatives ? ?Physical Exam ?BP (!) 120/51   Pulse (!) 123   Temp 98.9 ?F (37.2 ?C) (Oral)   Resp 15   Ht '5\' 6"'  (1.676 m)   Wt (!) 147.4 kg   SpO2 100%   BMI 52.46 kg/m?  ? ?Physical Exam ?Vitals and nursing note reviewed.  ?Constitutional:   ?   Appearance: Normal appearance.  ?HENT:  ?   Head: Normocephalic and atraumatic.  ?   Nose: Nose normal.  ?   Mouth/Throat:  ?   Mouth: Mucous membranes are moist.  ?Eyes:  ?   Extraocular Movements: Extraocular movements intact.  ?   Conjunctiva/sclera: Conjunctivae normal.  ?Cardiovascular:  ?   Rate and Rhythm: Tachycardia present.  ?Pulmonary:  ?   Effort: Pulmonary effort is normal.  ?   Breath sounds: Normal breath sounds. No decreased breath sounds, wheezing or rales.  ?Abdominal:  ?   General: Abdomen is flat.  ?   Palpations: Abdomen is soft.  ?   Tenderness: There is no abdominal tenderness.  ?Musculoskeletal:     ?   General: No swelling. Normal range of motion.  ?  Cervical back: Neck supple.  ?   Right lower leg: Edema present.  ?   Left lower leg: Edema present.  ?Skin: ?   General: Skin is warm and dry.  ?   Coloration: Skin is pale.  ?Neurological:  ?   General: No focal deficit present.  ?   Mental Status: She is alert.  ?Psychiatric:     ?   Mood and Affect: Mood normal.  ? ? ?ED Results / Procedures / Treatments   ?EKG ?None ? ?Procedures ?Procedures ? ?Medications Ordered in the ED ?Medications  ?cefTRIAXone (ROCEPHIN) 1 g in sodium chloride 0.9 % 100 mL IVPB (0 g Intravenous Stopped 09/21/21 0412)  ?azithromycin (ZITHROMAX) 500 mg in sodium chloride 0.9 % 250 mL IVPB (0 mg Intravenous Stopped 09/21/21 0557)  ?iohexol (OMNIPAQUE) 350 MG/ML injection 100 mL (100 mLs Intravenous Contrast Given 09/21/21 0505)  ? ? ?Initial  Impression and Plan ? Patient with complex recent medical history here for SOB and tachycardia. Consider worsening anemia, pneumonia, CHF or PE. Will check labs, CXR, EKG. She is not hypoxic on room air. Was on 3L [place] from EMS for unclear reason. Anticipate she will also need CTA when labs have returned.  ? ?ED Course  ? ?Clinical Course as of 09/21/21 0712  ?Sun Sep 21, 2021  ?0205 I personally viewed the images from radiology studies and agree with radiologist interpretation: CXR concerning for RLL pneumonia and CHF.  ? [CS]  ?0240 CBC with normal WBC, Hgb and PLT are stable.  [CS]  ?5188 Lactic acid is neg. BNP is not significantly elevated.  [CS]  ?0709 Care of the patient will be signed out to Dr. Gilford Raid at the change of shift pending CTA. Anticipate that the patient will need admission pending those results.  [CS]  ?  ?Clinical Course User Index ?[CS] Truddie Hidden, MD  ? ? ? ?MDM Rules/Calculators/A&P ?Medical Decision Making ?Amount and/or Complexity of Data Reviewed ?Labs: ordered. Decision-making details documented in ED Course. ?Radiology: ordered and independent interpretation performed. Decision-making details documented in ED Course. ?ECG/medicine tests: ordered and independent interpretation performed. Decision-making details documented in ED Course. ? ?Risk ?Prescription drug management. ?Decision regarding hospitalization. ? ? ? ?Final Clinical Impression(s) / ED Diagnoses ?Final diagnoses:  ?Community acquired pneumonia of right lower lobe of lung  ?Tachycardia  ? ? ?Rx / DC Orders ?ED Discharge Orders   ? ? None  ? ?  ? ?  ?Truddie Hidden, MD ?09/21/21 [phone] ? ?

## 2021-09-21 NOTE — H&P (Signed)
?History and Physical  ? ? ?Patient: Theresa Moran QJJ:[id number] DOB: 1960/08/08 ?DOA: 09/21/2021 ?DOS: the patient was seen and examined on 09/21/2021 ?PCP: Tawnya Crook, MD  ?Patient coming from: Home via EMS ? ?Chief Complaint:  ?Chief Complaint  ?Patient presents with  ? Shortness of Breath  ?  BIBA from home, past few weeks Hgb and Hct have been low. Had X2 blood transfusions at [facility], pt states scheduled for blood transfusion on Monday. Productive cough X 3 days  ? ?HPI: Theresa Moran is a 61 y.o. female with medical history significant of neuropathy, anxiety/depression, hemolytic anemia, and thrombocytopenia who presents with complaints of shortness of breath.  She notes that everything is happened over the last 3 weeks. Patient has been being worked up in outpatient setting by oncology in regards to thrombocytopenia and anemia  Underwent bone marrow biopsy on 4/24 that noted hypercellular marrow with erythroid hyperplasia.  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  Patient had been referred to GI and is scheduled for EGD on 5/16. she has flow cytometry and other studies still pending.  6 days ago, she was transfused 2 units of packed red blood cells due to hemoglobin dropping down to 6.8 g/dL. Had a Cologuard test earlier this year that was negative denies any reports of blood in her stool.   She reports that she has had progressively worsening shortness of breath with leg swelling, and reported 15 pound weight gain over the last week. Her heart rates have been elevating up into the 130s and she is unable to move or do anything without being extremely short of breath.  Patient has had a mostly nonproductive cough until yesterday when she started bringing up grayish-green sputum.  She notes that her 62-year-old grandchild had a stomach virus  recently, but otherwise denies any recent sick contacts. She notes that she developed these red marks on her chest in the last 2 weeks.  Denies having any nausea, vomiting or diarrhea.  She does not have any significant history of alcohol use and has a 10-year smoking history, but quit over 15 years ago. Patient notes she was supposed to go to cardiology and be evaluated tomorrow because of the elevated heart rate.  ? ?Upon admission into the emergency department patient was seen to be afebrile with respirations 116-124, respirations 13-23, and all other vital signs maintained.  Labs Labs significant for hemoglobin 8.5, platelets 130, alkaline phosphatase 141, albumin 2.8, AST 51, BNP 39.6, and lactic acid 1.2->1.3.   Chest x-ray noted right basilar pneumonia superimposed over mild changes of CHF.  CTA of the chest was obtained noting low lung volumes small new layering pleural effusions and new multifocal bilateral lower lobe and right middle lobe opacity resembling a multifocal pneumonia. Blood cultures were obtained.  Patient was started on empiric antibiotics of Rocephin and azithromycin.  TRH called to admit. ? ?Review of Systems: As mentioned in the history of present illness. All other systems reviewed and are negative. ?Past Medical History:  ?Diagnosis Date  ? Allergy   ? Anxiety   ? Asthma   ? in the past  ? Chronic pain   ? Depression   ? Gallstones   ? GERD (gastroesophageal reflux disease)   ? Headache   ? stopped after menopause  ? Neuromuscular disorder ([place])   ? Urticaria   ? ?  Past Surgical History:  ?Procedure Laterality Date  ? APPENDECTOMY  1979  ? BREAST REDUCTION SURGERY    ? BREAST SURGERY  1980  ? breast reduction  ? CHOLECYSTECTOMY    ? NASAL SEPTUM SURGERY    ? ORIF ANKLE FRACTURE Left 03/26/2021  ? Procedure: OPEN REDUCTION INTERNAL FIXATION (ORIF) ANKLE FRACTURE;  Surgeon: Armond Hang, MD;  Location: [facility];  Service: Orthopedics;  Laterality: Left;  ? ?Social History:  reports that  she quit smoking about 17 years ago. Her smoking use included cigarettes. She has a 15.00 pack-year smoking history. She has never used smokeless tobacco. She reports that she does not currently use alcohol. She reports that she does not use drugs. ? ?Allergies  ?Allergen Reactions  ? Shellfish Allergy Anaphylaxis, Hives, Itching, Shortness Of Breath and Swelling  ? Shellfish-Derived Products   ? ? ?Family History  ?Problem Relation Age of Onset  ? Intellectual disability Mother   ? Hypertension Mother   ? Hyperlipidemia Mother   ? Depression Mother   ? Arthritis Mother   ? Mental illness Mother   ? Heart disease Father   ? Diabetes Father   ? Stroke Father   ? Heart disease Brother   ? COPD Brother   ? Mental illness Daughter   ? Learning disabilities Daughter   ? Diabetes Daughter   ? Allergic rhinitis Neg Hx   ? Angioedema Neg Hx   ? Asthma Neg Hx   ? Eczema Neg Hx   ? Immunodeficiency Neg Hx   ? Urticaria Neg Hx   ? Colon cancer Neg Hx   ? Pancreatic cancer Neg Hx   ? Liver cancer Neg Hx   ? Stomach cancer Neg Hx   ? ? ?Prior to Admission medications   ?Medication Sig Start Date End Date Taking? Authorizing Provider  ?baclofen (LIORESAL) 10 MG tablet Take 10 mg by mouth 2 (two) times daily.   Yes [provider]  ?busPIRone (BUSPAR) 10 MG tablet Take 1 tablet (10 mg total) by mouth 2 (two) times daily. 02/12/20  Yes Jacelyn Pi, Lilia Argue, MD  ?gabapentin (NEURONTIN) 600 MG tablet Take 600 mg by mouth 2 (two) times daily.   Yes [provider]  ?ibuprofen (ADVIL) 600 MG tablet Take 600 mg by mouth every 6 (six) hours as needed for headache or moderate pain.   Yes [provider]  ?NUCYNTA ER 50 MG 12 hr tablet Take 50 mg by mouth daily. 09/12/21  Yes [provider]  ?nortriptyline (PAMELOR) 50 MG capsule Take 100 capsules by mouth at bedtime. 08/27/21 11/25/21  [provider]  ? ? ?Physical Exam: ?Vitals:  ? 09/21/21 0345 09/21/21 0515 09/21/21 0530 09/21/21 0747  ?BP: (!)  128/52 (!) 125/56 (!) 120/51 (!) 120/44  ?Pulse: (!) 116 (!) 116 (!) 123 (!) 123  ?Resp: _0 ?Temp:      ?TempSrc:      ?SpO2: 98% 98% 100% 98%  ?Weight:      ?Height:      ? ? ? ?Constitutional: Older female who appears to be somewhat uncomfortable sitting up in the hospital bed ?Eyes: PERRL, lids and conjunctivae normal ?ENMT: Mucous membranes are moist. Posterior pharynx clear of any exudate or lesions. ?Neck: normal, supple ?Respiratory: Decreased aeration with some intermittent crackles appreciated in the lower lung fields.  O2 saturations maintained on room air ?Cardiovascular: Tachycardic without significant murmur appreciated.  Least 2+ pitting lower extremity edema present up to  thighs.  2+ pedal pulses. No carotid bruits.  ?Abdomen: No tenderness to palpation.  Bowel sounds appreciated. ?Musculoskeletal: nO cyanosis. No joint deformity upper and lower extremities. Good ROM, no contractures. Normal muscle tone.  ?Skin: spider angiomas noted of the anterior chest ?Neurologic: CN 2-12 grossly intact. Sensation intact, DTR normal. Strength 5/5 in all 4.  ?Psychiatric: Patient appears to be alert and oriented x3, but admits to being more foggy recently. ? ?Data Reviewed: ? ?Reviewed labs, images, and pertinent records as noted above in HPI ? ?Assessment and Plan: ?Community-acquired pneumonia ?Patient presents with complaints of worsening shortness of breath.  Initially noted to be tachycardic and tachypneic meeting SIRS criteria.  CTA of the chest did not note any signs of pulmonary embolus, but did note a multifocal appearing pneumonia with small pleural effusions.  Patient has had shallow breathing which may have led to her being at risk for pneumonia. ?-Admit to a telemetry bed ?-Aspiration precautions with elevation of head of bed ?-Incentive spirometry and flutter valve ?-Follow-up blood cultures ?-Check procalcitonin ?-Check sputum studies and cultures if able to be obtained ?-Continue empiric  antibiotics of Rocephin and azithromycin ? ?SIRS ?Acute.  Patient was noted to be tachycardic and tachypneic on admission meeting SIRS criteria.  Thought secondary to above.  White blood cell count was at t

## 2021-09-22 ENCOUNTER — Inpatient Hospital Stay: Payer: Medicare Other | Admitting: Hematology and Oncology

## 2021-09-22 ENCOUNTER — Inpatient Hospital Stay (HOSPITAL_COMMUNITY): Payer: Medicare Other

## 2021-09-22 ENCOUNTER — Ambulatory Visit: Payer: Medicare Other | Admitting: Internal Medicine

## 2021-09-22 ENCOUNTER — Encounter: Payer: Self-pay | Admitting: Hematology and Oncology

## 2021-09-22 ENCOUNTER — Telehealth: Payer: Self-pay | Admitting: Family Medicine

## 2021-09-22 DIAGNOSIS — K7682 Hepatic encephalopathy: Secondary | ICD-10-CM | POA: Diagnosis present

## 2021-09-22 DIAGNOSIS — R0609 Other forms of dyspnea: Secondary | ICD-10-CM

## 2021-09-22 DIAGNOSIS — D508 Other iron deficiency anemias: Secondary | ICD-10-CM

## 2021-09-22 DIAGNOSIS — R600 Localized edema: Secondary | ICD-10-CM | POA: Diagnosis not present

## 2021-09-22 DIAGNOSIS — D599 Acquired hemolytic anemia, unspecified: Secondary | ICD-10-CM | POA: Diagnosis not present

## 2021-09-22 DIAGNOSIS — K7460 Unspecified cirrhosis of liver: Secondary | ICD-10-CM | POA: Diagnosis not present

## 2021-09-22 DIAGNOSIS — J189 Pneumonia, unspecified organism: Secondary | ICD-10-CM | POA: Diagnosis not present

## 2021-09-22 LAB — ECHOCARDIOGRAM COMPLETE
AR max vel: 2.79 cm2
AV Area VTI: 3.39 cm2
AV Area mean vel: 2.82 cm2
AV Mean grad: 7 mmHg
AV Peak grad: 13.1 mmHg
Ao pk vel: 1.81 m/s
Area-P 1/2: 5.97 cm2
Calc EF: 63.7 %
Height: 66 in
MV VTI: 2.19 cm2
S' Lateral: 3 cm
Single Plane A2C EF: 58.8 %
Single Plane A4C EF: 67.7 %
Weight: 5200 oz

## 2021-09-22 LAB — BRAIN NATRIURETIC PEPTIDE: B Natriuretic Peptide: 69.7 pg/mL (ref 0.0–100.0)

## 2021-09-22 LAB — HEPATITIS PANEL, ACUTE
HCV Ab: NONREACTIVE
Hep A IgM: NONREACTIVE
Hep B C IgM: NONREACTIVE
Hepatitis B Surface Ag: NONREACTIVE

## 2021-09-22 LAB — COMPREHENSIVE METABOLIC PANEL
ALT: 23 U/L (ref 0–44)
AST: 46 U/L — ABNORMAL HIGH (ref 15–41)
Albumin: 2.5 g/dL — ABNORMAL LOW (ref 3.5–5.0)
Alkaline Phosphatase: 108 U/L (ref 38–126)
Anion gap: 5 (ref 5–15)
BUN: 9 mg/dL (ref 8–23)
CO2: 25 mmol/L (ref 22–32)
Calcium: 8.2 mg/dL — ABNORMAL LOW (ref 8.9–10.3)
Chloride: 107 mmol/L (ref 98–111)
Creatinine, Ser: 0.85 mg/dL (ref 0.44–1.00)
GFR, Estimated: >60 mL/min (ref >60)
Glucose, Bld: 128 mg/dL — ABNORMAL HIGH (ref 70–99)
Potassium: 3.9 mmol/L (ref 3.5–5.1)
Sodium: 137 mmol/L (ref 135–145)
Total Bilirubin: 1.1 mg/dL (ref 0.3–1.2)
Total Protein: 5.9 g/dL — ABNORMAL LOW (ref 6.5–8.1)

## 2021-09-22 LAB — CBC
HCT: 25.7 % — ABNORMAL LOW (ref 36.0–46.0)
Hemoglobin: 8.1 g/dL — ABNORMAL LOW (ref 12.0–15.0)
MCH: 29.6 pg (ref 26.0–34.0)
MCHC: 31.5 g/dL (ref 30.0–36.0)
MCV: 93.8 fL (ref 80.0–100.0)
Platelets: 134 10*3/uL — ABNORMAL LOW (ref 150–400)
RBC: 2.74 MIL/uL — ABNORMAL LOW (ref 3.87–5.11)
RDW: 17.4 % — ABNORMAL HIGH (ref 11.5–15.5)
WBC: 7.6 10*3/uL (ref 4.0–10.5)
nRBC: 0 % (ref 0.0–0.2)

## 2021-09-22 LAB — AMMONIA: Ammonia: 33 umol/L (ref 9–35)

## 2021-09-22 LAB — LACTATE DEHYDROGENASE: LDH: 183 U/L (ref 98–192)

## 2021-09-22 LAB — PROTIME-INR
INR: 1.5 — ABNORMAL HIGH (ref 0.8–1.2)
Prothrombin Time: 18.2 seconds — ABNORMAL HIGH (ref 11.4–15.2)

## 2021-09-22 LAB — RETICULOCYTES
Immature Retic Fract: 29.7 % — ABNORMAL HIGH (ref 2.3–15.9)
RBC.: 2.85 MIL/uL — ABNORMAL LOW (ref 3.87–5.11)
Retic Count, Absolute: 128 10*3/uL (ref 19.0–186.0)
Retic Ct Pct: 4.5 % — ABNORMAL HIGH (ref 0.4–3.1)

## 2021-09-22 LAB — MAGNESIUM
Magnesium: 2 mg/dL (ref 1.7–2.4)
Magnesium: 2.2 mg/dL (ref 1.7–2.4)

## 2021-09-22 LAB — TSH: TSH: 3.147 u[IU]/mL (ref 0.350–4.500)

## 2021-09-22 LAB — PROCALCITONIN: Procalcitonin: 0.2 ng/mL

## 2021-09-22 MED ORDER — PANTOPRAZOLE SODIUM 40 MG PO TBEC
40.0000 mg | DELAYED_RELEASE_TABLET | Freq: Every day | ORAL | Status: DC
  Administered 2021-09-22 – 2021-09-28 (×7): 40 mg via ORAL
  Filled 2021-09-22 (×7): qty 1

## 2021-09-22 MED ORDER — FUROSEMIDE 40 MG PO TABS
40.0000 mg | ORAL_TABLET | Freq: Once | ORAL | Status: AC
  Administered 2021-09-22: 40 mg via ORAL
  Filled 2021-09-22: qty 1

## 2021-09-22 MED ORDER — POTASSIUM CHLORIDE CRYS ER 20 MEQ PO TBCR
40.0000 meq | EXTENDED_RELEASE_TABLET | Freq: Once | ORAL | Status: AC
  Administered 2021-09-22: 40 meq via ORAL
  Filled 2021-09-22: qty 2

## 2021-09-22 MED ORDER — FUROSEMIDE 10 MG/ML IJ SOLN: 40.0000 mg | Freq: Every day | INTRAMUSCULAR | Status: DC

## 2021-09-22 MED ORDER — SODIUM CHLORIDE 0.9 % IV SOLN
510.0000 mg | Freq: Once | INTRAVENOUS | Status: DC
  Filled 2021-09-22: qty 17

## 2021-09-22 MED ORDER — HEPARIN SODIUM (PORCINE) 5000 UNIT/ML IJ SOLN
5000.0000 [IU] | Freq: Three times a day (TID) | INTRAMUSCULAR | Status: DC
Start: 2021-09-22 — End: 2021-09-25
  Administered 2021-09-22 – 2021-09-25 (×9): 5000 [IU] via SUBCUTANEOUS
  Filled 2021-09-22 (×9): qty 1

## 2021-09-22 NOTE — Evaluation (Signed)
Occupational Therapy Evaluation ?Patient Details ?Name: Theresa Moran ?MRN: [id number] ?DOB: [birth year] ?Today's Date: 09/22/2021 ? ? ?History of Present Illness Pt is a 62 year old woman admitted on 09/21/21 with SOB and LE edema x 3 weeks. + CAP and SIRS PMH: neuropathy, anxiety, depression, hemolytic anemia, thrombocytopenia, ankle fx, morbid obesity. Pt with recent imaging + hepatic cirrhosis, splenomegaly, thickening of stomach concerning for lymphoma or inflammatory process.  ? ?Clinical Impression ?  ?Pt is functioning modified independently in ADLs and mobility with RW. HR elevated to 120s with ambulation to bathroom. Pt is knowledgeable in energy conservation strategies. No further OT needs.  ?   ? ?Recommendations for follow up therapy are one component of a multi-disciplinary discharge planning process, led by the attending physician.  Recommendations may be updated based on patient status, additional functional criteria and insurance authorization.  ? ?Follow Up Recommendations ? No OT follow up  ?  ?Assistance Recommended at Discharge None  ?Patient can return home with the following Help with stairs or ramp for entrance;Assist for transportation ? ?  ?Functional Status Assessment ? Patient has had a recent decline in their functional status and demonstrates the ability to make significant improvements in function in a reasonable and predictable amount of time.  ?Equipment Recommendations ? None recommended by OT  ?  ?Recommendations for Other Services   ? ? ?  ?Precautions / Restrictions Precautions ?Precautions: Fall ?Precaution Comments: watch HR  ? ?  ? ?Mobility Bed Mobility ?Overal bed mobility: Modified Independent ?  ?  ?  ?  ?  ?  ?  ?  ? ?Transfers ?Overall transfer level: Modified independent ?Equipment used: Rolling walker (2 wheels) ?  ?  ?  ?  ?  ?  ?  ?  ?  ? ?  ?Balance Overall balance assessment: Modified Independent ?  ?  ?  ?  ?  ?  ?  ?  ?  ?  ?  ?  ?  ?  ?  ?  ?  ?  ?   ? ?ADL either  performed or assessed with clinical judgement  ? ?ADL Overall ADL's : Modified independent ?  ?  ?  ?  ?  ?  ?  ?  ?  ?  ?  ?  ?  ?  ?  ?  ?  ?  ?  ?General ADL Comments: Pt is knowledgeable in energy conservation strategies, has all necessary DME.  ? ? ? ?Vision Baseline Vision/History: 1 Wears glasses ?Ability to See in Adequate Light: 0 Adequate ?   ?   ?Perception   ?  ?Praxis   ?  ? ?Pertinent Vitals/Pain Pain Assessment ?Pain Assessment: No/denies pain  ? ? ? ?Hand Dominance Right ?  ?Extremity/Trunk Assessment Upper Extremity Assessment ?Upper Extremity Assessment: Overall WFL for tasks assessed ?  ?Lower Extremity Assessment ?Lower Extremity Assessment: Defer to PT evaluation ?  ?Cervical / Trunk Assessment ?Cervical / Trunk Assessment: Other exceptions ?Cervical / Trunk Exceptions: increased body habitus ?  ?Communication Communication ?Communication: No difficulties ?  ?Cognition Arousal/Alertness: Awake/alert ?Behavior During Therapy: [facility] for tasks assessed/performed ?Overall Cognitive Status: Within Functional Limits for tasks assessed ?  ?  ?  ?  ?  ?  ?  ?  ?  ?  ?  ?  ?  ?  ?  ?  ?  ?  ?  ?General Comments    ? ?  ?Exercises   ?  ?  Shoulder Instructions    ? ? ?Home Living Family/patient expects to be discharged to:: Private residence ?Living Arrangements: Spouse/significant other;Children (4 grandchildren) ?Available Help at Discharge: Family;Available 24 hours/day ?Type of Home: House ?Home Access: Stairs to enter ?Entrance Stairs-Number of Steps: 1 + 1 ?Entrance Stairs-Rails: None ?Home Layout: Multi-level;Able to live on main level with bedroom/bathroom ?Alternate Level Stairs-Number of Steps: stair lift to basement ?  ?Bathroom Shower/Tub: Tub/shower unit ?  ?Bathroom Toilet: Standard ?  ?  ?Home Equipment: Electric scooter;Cane - single point;Hand held shower head;Shower Land (2 wheels);Adaptive equipment ?Adaptive Equipment: [place] ?Additional Comments: adopted grandchildren ?   ? ?  ?Prior Functioning/Environment Prior Level of Function : Independent/Modified Independent;Driving ?  ?  ?  ?  ?  ?  ?Mobility Comments: used electric scooter long distance ?  ?  ? ?  ?  ?OT Problem List:   ?  ?   ?OT Treatment/Interventions:    ?  ?OT Goals(Current goals can be found in the care plan section)    ?OT Frequency:   ?  ? ?Co-evaluation   ?  ?  ?  ?  ? ?  ?AM-PAC OT "6 Clicks" Daily Activity     ?Outcome Measure Help from another person eating meals?: None ?Help from another person taking care of personal grooming?: None ?Help from another person toileting, which includes using toliet, bedpan, or urinal?: None ?Help from another person bathing (including washing, rinsing, drying)?: None ?Help from another person to put on and taking off regular upper body clothing?: None ?Help from another person to put on and taking off regular lower body clothing?: None ?6 Click Score: 24 ?  ?End of Session Equipment Utilized During Treatment: Rolling walker (2 wheels) ? ?Activity Tolerance: Patient tolerated treatment well ?Patient left: in bed;with call bell/phone within reach;Other (comment) (with [place]) ? ?OT Visit Diagnosis: Other abnormalities of gait and mobility (R26.89)  ?              ?Time: 2706-2376 ?OT Time Calculation (min): 30 min ?Charges:  OT General Charges ?$OT Visit: 1 Visit ?OT Evaluation ?$OT Eval Low Complexity: 1 Low ?OT Treatments ?$Self Care/Home Management : 8-22 mins ? ?Nestor Lewandowsky, OTR/L ?Acute Rehabilitation Services ?Pager: [phone] ?Office: [phone]  ? ?Malka So ?09/22/2021, 11:01 AM ?

## 2021-09-22 NOTE — Evaluation (Signed)
Clinical/Bedside Swallow Evaluation ?Patient Details  ?Name: Theresa Moran ?MRN: [id number] ?Date of Birth: Feb 24, 1961 ? ?Today's Date: 09/22/2021 ?Time: SLP Start Time (ACUTE ONLY): 6948 SLP Stop Time (ACUTE ONLY): 1208 ?SLP Time Calculation (min) (ACUTE ONLY): 23 min ? ?Past Medical History:  ?Past Medical History:  ?Diagnosis Date  ? Allergy   ? Anxiety   ? Asthma   ? in the past  ? Chronic pain   ? Depression   ? Gallstones   ? GERD (gastroesophageal reflux disease)   ? Headache   ? stopped after menopause  ? Neuromuscular disorder ([place])   ? Urticaria   ? ?Past Surgical History:  ?Past Surgical History:  ?Procedure Laterality Date  ? APPENDECTOMY  1979  ? BREAST REDUCTION SURGERY    ? BREAST SURGERY  1980  ? breast reduction  ? CHOLECYSTECTOMY    ? NASAL SEPTUM SURGERY    ? ORIF ANKLE FRACTURE Left 03/26/2021  ? Procedure: OPEN REDUCTION INTERNAL FIXATION (ORIF) ANKLE FRACTURE;  Surgeon: Armond Hang, MD;  Location: [facility];  Service: Orthopedics;  Laterality: Left;  ? ?HPI:  ?61 y.o. female with medical history significant of neuropathy, anxiety/depression, hemolytic anemia, and thrombocytopenia who presents with complaints of shortness of breath.  She notes that everything is happened over the last 3 weeks. Patient has been being worked up in outpatient setting by oncology in regards to thrombocytopenia and anemia  Underwent bone marrow biopsy on 4/24 that noted hypercellular marrow with erythroid hyperplasia.  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  Patient had been referred to GI and is scheduled for EGD on 5/16. she has flow cytometry and other studies still pending.  6 days ago, she was transfused 2 units of packed red blood cells due to hemoglobin dropping down to 6.8 g/dL. Had a Cologuard test earlier this year that was negative denies  any reports of blood in her stool.   She reports that she has had progressively worsening shortness of breath with leg swelling, and reported 15 pound weight gain over the last week. Her heart rates have been elevating up into the 130s and she is unable to move or do anything without being extremely short of breath; CTA chest with new multifocal bilateral LL and right middle lobe opacities representing multifocal PNA; BSE generated.  ?  ?Assessment / Plan / Recommendation  ?Clinical Impression ? Pt seen for a clinical swallowing evaluation with a normal oropharyngeal swallow noted during intake of thin via straw, puree and solids.  Timely swallow with good oral/pharyngeal clearance and no overt s/s of aspiration noted throughout trial.  Pt reports intermittent globus sensation during meals that occurred "fairly recently", but this was not observed during meal.  Recommend continuing Regular/thin liquid diet.  Pt has pending EGD on 10/07/21 that could address globus sensation, but if symptoms persist, ST may be consulted again for dysphagia tx prn; Otherwise, ST will s/o in acute setting. ? ?SLP Visit Diagnosis: Dysphagia, unspecified (R13.10) ?   ?Aspiration Risk ? No limitations  ?  ?Diet Recommendation   Regular/thin liquids ? ?Medication Administration: Whole meds with liquid  ?  ?Other  Recommendations Recommended Consults: Other (Comment) (has current GI consult with EGD pending on 10/07/21) ?Oral Care Recommendations: Oral care BID   ? ?Recommendations for follow up therapy are one component of a multi-disciplinary discharge planning process,  led by the attending physician.  Recommendations may be updated based on patient status, additional functional criteria and insurance authorization. ? ?Follow up Recommendations Other (comment) (Has consult with GI for EGD on 10/07/21.)  ? ? ?  ?Assistance Recommended at Discharge None  ?Functional Status Assessment Patient has had a recent decline in their functional status  and demonstrates the ability to make significant improvements in function in a reasonable and predictable amount of time.  ?Frequency and Duration  (Evaluation only)  ?  ?  ?   ? ?Prognosis Prognosis for Safe Diet Advancement: Good  ? ?  ? ?Swallow Study   ?General Date of Onset: 09/22/21 ?HPI: 61 y.o. female with medical history significant of neuropathy, anxiety/depression, hemolytic anemia, and thrombocytopenia who presents with complaints of shortness of breath.  She notes that everything is happened over the last 3 weeks. Patient has been being worked up in outpatient setting by oncology in regards to thrombocytopenia and anemia  Underwent bone marrow biopsy on 4/24 that noted hypercellular marrow with erythroid hyperplasia.  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  Patient had been referred to GI and is scheduled for EGD on 5/16. she has flow cytometry and other studies still pending.  6 days ago, she was transfused 2 units of packed red blood cells due to hemoglobin dropping down to 6.8 g/dL. Had a Cologuard test earlier this year that was negative denies any reports of blood in her stool.   She reports that she has had progressively worsening shortness of breath with leg swelling, and reported 15 pound weight gain over the last week. Her heart rates have been elevating up into the 130s and she is unable to move or do anything without being extremely short of breath; CTA chest with new multifocal bilateral LL and right middle lobe opacities representing multifocal PNA; BSE generated. ?Type of Study: Bedside Swallow Evaluation ?Previous Swallow Assessment: n/a ?Diet Prior to this Study: Regular;Thin liquids ?Temperature Spikes Noted: No ?Respiratory Status: Room air ?History of Recent Intubation: No ?Behavior/Cognition:  Alert;Cooperative;Pleasant mood ?Oral Cavity Assessment: Within Functional Limits ?Oral Care Completed by SLP: No ?Oral Cavity - Dentition: Adequate natural dentition;Missing dentition ?Vision: Functional for self-feeding ?Self-Feeding Abilities: Able to feed self ?Patient Positioning: Other (comment) (upright on side of bed) ?Baseline Vocal Quality: Normal ?Volitional Cough: Strong ?Volitional Swallow: Able to elicit  ?  ?Oral/Motor/Sensory Function Overall Oral Motor/Sensory Function: Within functional limits   ?Ice Chips Ice chips: Not tested   ?Thin Liquid Thin Liquid: Within functional limits ?Presentation: Straw  ?  ?Nectar Thick Nectar Thick Liquid: Not tested   ?Honey Thick Honey Thick Liquid: Not tested   ?Puree Puree: Within functional limits ?Presentation: Self Fed   ?Solid ? ? ?  Solid: Within functional limits ?Presentation: Self Fed  ? ?  ? ?Elvina Sidle, M.S., CCC-SLP ?09/22/2021,12:19 PM ? ? ? ?

## 2021-09-22 NOTE — Progress Notes (Deleted)
Cardiology Office Note:    Date:  09/22/2021   ID:  Theresa Moran, DOB Oct 17, 1960, MRN [id number]  PCP:  Tawnya Crook, MD   [facility] Providers Cardiologist:  None { Click to update primary MD,subspecialty MD or APP then REFRESH:1}    Referring MD: Tawnya Crook, MD   No chief complaint on file. ***  History of Present Illness:    Theresa Moran is a 61 y.o. female with a hx of depression, chronic pain presented to the ED with SOB and tachycardia in the setting of anemia  Saw hematology , noted to have hemolysis. Possibly related to complement consumption. She's being evaluated by GI for anemia. Cardiology was consulted 2/2 sinus tachycardia HR 107 bpm.     Past Medical History:  Diagnosis Date   Allergy    Anxiety    Asthma    in the past   Chronic pain    Depression    Gallstones    GERD (gastroesophageal reflux disease)    Headache    stopped after menopause   Neuromuscular disorder ([facility])    Urticaria     Past Surgical History:  Procedure Laterality Date   APPENDECTOMY  1979   BREAST REDUCTION SURGERY     BREAST SURGERY  1980   breast reduction   CHOLECYSTECTOMY     NASAL SEPTUM SURGERY     ORIF ANKLE FRACTURE Left 03/26/2021   Procedure: OPEN REDUCTION INTERNAL FIXATION (ORIF) ANKLE FRACTURE;  Surgeon: Armond Hang, MD;  Location: [facility];  Service: Orthopedics;  Laterality: Left;    Current Medications: No outpatient medications have been marked as taking for the 09/22/21 encounter (Appointment) with Janina Mayo, MD.     Allergies:   Shellfish allergy and Shellfish-derived products   Social History   Socioeconomic History   Marital status: Married    Spouse name: Not on file   Number of children: Not on file   Years of education: Not on file   Highest education level: Not on file  Occupational History   Not on file  Tobacco Use   Smoking status: Former    Packs/day: 1.00    Years: 15.00    Pack years: 15.00    Types: Cigarettes     Quit date: 01/2004    Years since quitting: 17.6   Smokeless tobacco: Never  Vaping Use   Vaping Use: Never used  Substance and Sexual Activity   Alcohol use: Not Currently   Drug use: No   Sexual activity: Not Currently  Other Topics Concern   Not on file  Social History Narrative   Has 1 child(artificial insem), 2 adopted, and 1 step from partner's former partner   Raising 4 grandchildren.   Social Determinants of Health   Financial Resource Strain: Not on file  Food Insecurity: Not on file  Transportation Needs: Not on file  Physical Activity: Not on file  Stress: Not on file  Social Connections: Not on file     Family History: The patient's ***family history includes Arthritis in her mother; COPD in her brother; Depression in her mother; Diabetes in her daughter and father; Heart disease in her brother and father; Hyperlipidemia in her mother; Hypertension in her mother; Intellectual disability in her mother; Learning disabilities in her daughter; Mental illness in her daughter and mother; Stroke in her father. There is no history of Allergic rhinitis, Angioedema, Asthma, Eczema, Immunodeficiency, Urticaria, Colon cancer, Pancreatic cancer, Liver cancer, or Stomach cancer.  ROS:   Please see the history of present illness.    *** All other systems reviewed and are negative.  EKGs/Labs/Other Studies Reviewed:    The following studies were reviewed today: ***  EKG:  EKG is *** ordered today.  The ekg ordered today demonstrates ***  Recent Labs: 09/18/2021: TSH 2.564 09/22/2021: ALT 23; B Natriuretic Peptide 69.7; BUN 9; Creatinine, Ser 0.85; Hemoglobin 8.1; Magnesium 2.0; Platelets 134; Potassium 3.9; Sodium 137  Recent Lipid Panel    Component Value Date/Time   CHOL WILL FOLLOW 09/19/2021 0920   TRIG WILL FOLLOW 09/19/2021 0920   HDL 49.00 08/04/2021 0930   HDL 42 03/22/2018 1114   CHOLHDL 3 08/04/2021 0930   VLDL 18.6 08/04/2021 0930   LDLCALC 103 (H)  08/04/2021 0930   LDLCALC 120 (H) 03/22/2018 1114     Risk Assessment/Calculations:   {Does this patient have ATRIAL FIBRILLATION?:[phone]}       Physical Exam:    VS:  There were no vitals taken for this visit.    Wt Readings from Last 3 Encounters:  09/21/21 (!) 325 lb (147.4 kg)  09/19/21 (!) 324 lb (147 kg)  09/18/21 (!) 324 lb (147 kg)     GEN: *** Well nourished, well developed in no acute distress HEENT: Normal NECK: No JVD; No carotid bruits LYMPHATICS: No lymphadenopathy CARDIAC: ***RRR, no murmurs, rubs, gallops RESPIRATORY:  Clear to auscultation without rales, wheezing or rhonchi  ABDOMEN: Soft, non-tender, non-distended MUSCULOSKELETAL:  No edema; No deformity  SKIN: Warm and dry NEUROLOGIC:  Alert and oriented x 3 PSYCHIATRIC:  Normal affect   ASSESSMENT:    Tachycardia: physiologic response to anemia. Recommend continue transfusion per hematology. Her echo shows ***. PLAN:    In order of problems listed above:  ***      {Are you ordering a CV Procedure (e.g. stress test, cath, DCCV, TEE, etc)?   Press F2        :[id number]}    Medication Adjustments/Labs and Tests Ordered: Current medicines are reviewed at length with the patient today.  Concerns regarding medicines are outlined above.  No orders of the defined types were placed in this encounter.  No orders of the defined types were placed in this encounter.   There are no Patient Instructions on file for this visit.   Signed, Janina Mayo, MD  09/22/2021 8:14 AM    [facility]

## 2021-09-22 NOTE — Evaluation (Signed)
Physical Therapy Evaluation ?Patient Details ?Name: Theresa Moran ?MRN: [id number] ?DOB: 07/16/1960 ?Today's Date: 09/22/2021 ? ?History of Present Illness ? Pt is a 61 year old woman admitted on 09/21/21 with SOB and LE edema x 3 weeks. + CAP and SIRS PMH: neuropathy, anxiety, depression, hemolytic anemia, thrombocytopenia, ankle fx, morbid obesity. Pt with recent imaging + hepatic cirrhosis, splenomegaly, thickening of stomach concerning for lymphoma or inflammatory process.  ?Clinical Impression ? Patient presents with decreased mobility due to deficits listed in PT problem list.  She was able to ambulate in the hallway with RW and minguard to S assist.  Limited by SOB/general weakness.  SpO2 on RW was 98% with HR max 128.  PT will continue to follow acutely.  Recommend HHPT at d/c.    ?   ? ?Recommendations for follow up therapy are one component of a multi-disciplinary discharge planning process, led by the attending physician.  Recommendations may be updated based on patient status, additional functional criteria and insurance authorization. ? ?Follow Up Recommendations Home health PT ? ?  ?Assistance Recommended at Discharge Intermittent Supervision/Assistance  ?Patient can return home with the following ? A little help with walking and/or transfers;A little help with bathing/dressing/bathroom;Assistance with cooking/housework;Assist for transportation ? ?  ?Equipment Recommendations None recommended by PT  ?Recommendations for Other Services ?    ?  ?Functional Status Assessment Patient has had a recent decline in their functional status and demonstrates the ability to make significant improvements in function in a reasonable and predictable amount of time.  ? ?  ?Precautions / Restrictions Precautions ?Precautions: Fall ?Precaution Comments: watch HR  ? ?  ? ?Mobility ? Bed Mobility ?Overal bed mobility: Modified Independent ?  ?  ?  ?  ?  ?  ?General bed mobility comments: HOB up ?  ? ?Transfers ?Overall transfer  level: Needs assistance ?Equipment used: Rolling walker (2 wheels) ?Transfers: Sit to/from Stand ?Sit to Stand: Min guard ?  ?  ?  ?  ?  ?General transfer comment: pulls up on RW and minguard for balance ?  ? ?Ambulation/Gait ?Ambulation/Gait assistance: Min guard ?Gait Distance (Feet): 80 Feet (& 15') ?Assistive device: Rolling walker (2 wheels) ?Gait Pattern/deviations: Step-through pattern, Decreased stride length, Wide base of support ?  ?  ?  ?General Gait Details: increased lateral sway; assist for safety ? ?Stairs ?  ?  ?  ?  ?  ? ?Wheelchair Mobility ?  ? ?Modified Rankin (Stroke Patients Only) ?  ? ?  ? ?Balance Overall balance assessment: Needs assistance ?  ?Sitting balance-Leahy Scale: Good ?  ?  ?  ?Standing balance-Leahy Scale: Fair ?Standing balance comment: can stand and attempt toilet hygiene, but fatigued and needed help to complete ?  ?  ?  ?  ?  ?  ?  ?  ?  ?  ?  ?   ? ? ? ?Pertinent Vitals/Pain Pain Assessment ?Pain Assessment: 0-10 ?Pain Score: 2  ?Pain Location: L ankle ?Pain Descriptors / Indicators: Aching ?Pain Intervention(s): Monitored during session  ? ? ?Home Living Family/patient expects to be discharged to:: Private residence ?Living Arrangements: Spouse/significant other;Children (4 grandchildren she has custody of; wife works) ?Available Help at Discharge: Family;Available 24 hours/day ?Type of Home: House ?Home Access: Stairs to enter ?Entrance Stairs-Rails: None ?Entrance Stairs-Number of Steps: 1 + 1 ?Alternate Level Stairs-Number of Steps: stair lift to basement ?Home Layout: Multi-level;Able to live on main level with bedroom/bathroom ?Home Equipment: Electric scooter;Cane - single point;Hand  held shower head;Rolling Walker (2 wheels);Adaptive equipment;Tub bench ?Additional Comments: adopted grandchildren  ?  ?Prior Function Prior Level of Function : Independent/Modified Independent;Driving ?  ?  ?  ?  ?  ?  ?Mobility Comments: used electric scooter long distance ?ADLs  Comments: retired PT ?  ? ? ?Hand Dominance  ? Dominant Hand: Right ? ?  ?Extremity/Trunk Assessment  ? Upper Extremity Assessment ?Upper Extremity Assessment: Defer to OT evaluation ?  ? ?Lower Extremity Assessment ?Lower Extremity Assessment: Generalized weakness;LLE deficits/detail ?LLE Deficits / Details: AROM Limited about 10 degrees from neutral DF ?  ? ?Cervical / Trunk Assessment ?Cervical / Trunk Exceptions: increased body habitus  ?Communication  ? Communication: No difficulties  ?Cognition Arousal/Alertness: Awake/alert ?Behavior During Therapy: [facility] for tasks assessed/performed ?Overall Cognitive Status: Within Functional Limits for tasks assessed ?  ?  ?  ?  ?  ?  ?  ?  ?  ?  ?  ?  ?  ?  ?  ?  ?  ?  ?  ? ?  ?General Comments General comments (skin integrity, edema, etc.): HR max 128, BP previous to walk 110/56; post 138/59; SpO2 98% throughout on RA, but RR up to low 40's ? ?  ?Exercises    ? ?Assessment/Plan  ?  ?PT Assessment Patient needs continued PT services  ?PT Problem List Decreased strength;Decreased mobility;Decreased activity tolerance;Decreased balance;Decreased knowledge of use of DME;Pain;Decreased knowledge of precautions ? ?   ?  ?PT Treatment Interventions DME instruction;Therapeutic activities;Therapeutic exercise;Gait training;Patient/family education;Balance training;Functional mobility training   ? ?PT Goals (Current goals can be found in the Care Plan section)  ?Acute Rehab PT Goals ?Patient Stated Goal: to return to independent ?PT Goal Formulation: With patient ?Time For Goal Achievement: 10/06/21 ?Potential to Achieve Goals: Good ? ?  ?Frequency Min 3X/week ?  ? ? ?Co-evaluation   ?  ?  ?  ?  ? ? ?  ?AM-PAC PT "6 Clicks" Mobility  ?Outcome Measure Help needed turning from your back to your side while in a flat bed without using bedrails?: None ?Help needed moving from lying on your back to sitting on the side of a flat bed without using bedrails?: None ?Help needed moving to and  from a bed to a chair (including a wheelchair)?: A Little ?Help needed standing up from a chair using your arms (e.g., wheelchair or bedside chair)?: None ?Help needed to walk in hospital room?: A Little ?Help needed climbing 3-5 steps with a railing? : Total ?6 Click Score: 19 ? ?  ?End of Session Equipment Utilized During Treatment: Gait belt ?Activity Tolerance: Patient limited by fatigue ?Patient left: in chair;with call bell/phone within reach;with nursing/sitter in room (to assist with bath) ?  ?PT Visit Diagnosis: Other abnormalities of gait and mobility (R26.89);Muscle weakness (generalized) (M62.81);History of falling (Z91.81) ?  ? ?Time: 4709-6283 ?PT Time Calculation (min) (ACUTE ONLY): 26 min ? ? ?Charges:   PT Evaluation ?$PT Eval Moderate Complexity: 1 Mod ?PT Treatments ?$Gait Training: 8-22 mins ?  ?   ? ? ?Magda Kiel, PT ?Acute Rehabilitation Services ?MOQHU:[phone] ?Office:[phone] ?09/22/2021 ? ? ?Reginia Naas ?09/22/2021, 3:17 PM ? ?

## 2021-09-22 NOTE — Progress Notes (Signed)
?  Transition of Care (TOC) Screening Note ? ? ?Patient Details  ?Name: Theresa Moran ?Date of Birth: 1960-09-25 ? ? ?Transition of Care (TOC) CM/SW Contact:    ?Cyndi Bender, RN ?Phone Number: ?09/22/2021, 9:12 AM ? ? ? ?Transition of Care Department [facility]) has reviewed patient and no TOC needs have been identified at this time. We will continue to monitor patient advancement through interdisciplinary progression rounds. If new patient transition needs arise, please place a TOC consult. ? ? ?

## 2021-09-22 NOTE — Progress Notes (Signed)
?   09/21/21 2100  ?Assess: MEWS Score  ?Pulse Rate (!) 115  ?ECG Heart Rate (!) 115  ?Resp 12  ?Level of Consciousness Alert  ?SpO2 96 %  ?O2 Device Room Air  ?Assess: MEWS Score  ?MEWS Temp 0  ?MEWS Systolic 0  ?MEWS Pulse 2  ?MEWS RR 1  ?MEWS LOC 0  ?MEWS Score 3  ?MEWS Score Color Yellow  ?Assess: if the MEWS score is Yellow or Red  ?Were vital signs taken at a resting state? Yes  ?Focused Assessment No change from prior assessment  ?Early Detection of Sepsis Score *See Row Information* Low  ?MEWS guidelines implemented *See Row Information* Yes  ?Treat  ?MEWS Interventions Administered scheduled meds/treatments;Escalated (See documentation below);Other (Comment) ?(MD aware pt in tachycardia and pt had ekg earlier with rhythm unchanged)  ?Pain Scale 0-10  ?Pain Score 0  ?Take Vital Signs  ?Increase Vital Sign Frequency  Yellow: Q 2hr X 2 then Q 4hr X 2, if remains yellow, continue Q 4hrs  ?Escalate  ?MEWS: Escalate Yellow: discuss with charge nurse/RN and consider discussing with provider and RRT  ?Notify: Charge Nurse/RN  ?Name of Charge Nurse/RN Notified [place] RN  ?Date Charge Nurse/RN Notified 09/21/21  ?Time Charge Nurse/RN Notified 2100  ?Document  ?Patient Outcome Other (Comment) ?(stable sinus tachycardia , md aware ekg earlier done aware)  ?Progress note created (see row info) Yes  ? ? ?

## 2021-09-22 NOTE — Progress Notes (Signed)
?  Echocardiogram ?2D Echocardiogram has been performed. ? ?Theresa Moran ?09/22/2021, 2:21 PM ?

## 2021-09-22 NOTE — Progress Notes (Addendum)
?                                  PROGRESS NOTE                                             ?                                                                                                                     ?                                         ? ? Patient Demographics:  ? ? Theresa Moran, is a 61 y.o. female, DOB - Mar 26, 1961, YNW:[id number] ? ?Outpatient Primary MD for the patient is Tawnya Crook, MD    LOS - 1  Admit date - 09/21/2021   ? ?Chief Complaint  ?Patient presents with  ? Shortness of Breath  ?  BIBA from home, past few weeks Hgb and Hct have been low. Had X2 blood transfusions at [facility], pt states scheduled for blood transfusion on Monday. Productive cough X 3 days  ?    ? ?Brief Narrative (HPI from H&P)  61 y.o. female with medical history significant of neuropathy, anxiety/depression, hemolytic anemia, and thrombocytopenia who presents with complaints of shortness of breath.  She notes that everything is happened over the last 3 weeks. Patient has been being worked up in outpatient setting by oncology in regards to thrombocytopenia and anemia  Underwent bone marrow biopsy on 4/24 that noted hypercellular marrow with erythroid hyperplasia.  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  Patient had been referred to GI and is scheduled for EGD on 5/16. she has flow cytometry and other studies still pending.  6 days ago, she was transfused 2 units of packed red blood cells due to hemoglobin dropping down to 6.8 g/dL. ? ?Presented to the hospital with shortness of breath she was diagnosed with community-acquired pneumonia along with fluid overload and admitted to the hospital. ? ? ? Subjective:  ? ? Theresa Moran today has, No headache, No chest pain, No abdominal pain - No Nausea, No new weakness tingling or  numbness, +ve cough & SOB. ? ? Assessment  & Plan :  ? ? ?CAP - with productive cough and right-sided infiltrate, monitor cultures has been placed on appropriate empiric antibiotics which will be continued, since infiltrate is predominantly on the right side we will have speech therapy evaluate the patient as well.  Continue to monitor clinically. ? ?2.  Morbid obesity with recent suspicion  of hepatic cirrhosis on outpatient imaging.  Question if she has underlying NASH, she has appointment with GI physician Dr. Fuller Plan on May 16.  For now we will check right upper quadrant ultrasound along with acute hepatitis panel and monitor. ? ?3.  Recent diagnosis of hemolytic anemia, chronic thrombocytopenia.  Under the care of oncologist Dr.Iruku, outpatient work-up being done she has undergone bone marrow biopsy with final results pending, continue to monitor with supportive care as needed, she is open for transfusions if required. ? ?4.  Anxiety and depression.  Continue home regimen which includes BuSpar and nortriptyline. ? ?5.  Fluid overload.  Question CHF.  Continue diuresis echocardiogram pending. ? ?6.  Persistent tachycardia, CTA stable, echocardiogram pending, check TSH.  Question if her hemolytic anemia is causing a high output state. ? ?   ? ?Condition - Fair ? ?Family Communication  :  None present ? ?Code Status :  Full ? ?Consults  :  None ? ?PUD Prophylaxis : PPI ? ? Procedures  :    ? ?TTE ? ?RUQ [place] ? ?CTA -  1. Vascular contrast bolus had to be repeated. No pulmonary embolus identified. 2. Lower lung volumes from 10 days ago with small new layering pleural effusions and new with multifocal bilateral lower lobe and right middle lobe opacity most resembling Multifocal Pneumonia. 3. Cirrhosis.  Calcified coronary artery atherosclerosis ? ?   ? ?Disposition Plan  :   ? ?Status is: Inpatient ? ?DVT Prophylaxis  :   ? ?heparin injection 5,000 Units Start: 09/22/21 1400 ?Place TED hose Start: 09/22/21 0555 ?SCDs  Start: 09/21/21 0955 ?  ? ?Lab Results  ?Component Value Date  ? PLT 134 (L) 09/22/2021  ? ? ?Diet :  ?Diet Order   ? ?       ?  Diet Heart Room service appropriate? Yes; Fluid consistency: Thin; Fluid restriction: 1500 mL Fluid  Diet effective now       ?  ? ?  ?  ? ?  ?  ? ?Inpatient Medications ? ?Scheduled Meds: ? baclofen  10 mg Oral BID  ? busPIRone  10 mg Oral BID  ? [START ON 09/23/2021] furosemide  40 mg Intravenous Daily  ? furosemide  40 mg Oral Once  ? gabapentin  600 mg Oral BID  ? guaiFENesin  600 mg Oral BID  ? heparin injection (subcutaneous)  5,000 Units Subcutaneous Q8H  ? nortriptyline  100 mg Oral QHS  ? pantoprazole  40 mg Oral Daily  ? potassium chloride  40 mEq Oral Once  ? sodium chloride flush  3 mL Intravenous Q12H  ? tapentadol  50 mg Oral Daily  ? ?Continuous Infusions: ? azithromycin    ? cefTRIAXone (ROCEPHIN)  IV 2 g (09/22/21 0901)  ? ?PRN Meds:.acetaminophen **OR** acetaminophen, albuterol ? ?Time Spent in minutes  30 ? ? ?Theresa Moran M.D on 09/22/2021 at 9:54 AM ? ?To page go to [email or web address]  ? ?[facility] -  Office  [phone] ? ?See all Orders from today for further details ? ? ? Objective:  ? ?Vitals:  ? 09/22/21 0400 09/22/21 0500 09/22/21 0600 09/22/21 0749  ?BP: (!) 107/52   (!) 128/59  ?Pulse: (!) 102 (!) 102 (!) 104 (!) 105  ?Resp: '14 14 14 19  ' ?Temp: 98.9 ?F (37.2 ?C)   97.6 ?F (36.4 ?C)  ?TempSrc: Oral   Oral  ?SpO2: 94% 96% 93% 97%  ?Weight:      ?Height:      ? ? ?  Wt Readings from Last 3 Encounters:  ?09/21/21 (!) 147.4 kg  ?09/19/21 (!) 147 kg  ?09/18/21 (!) 147 kg  ? ? ? ?Intake/Output Summary (Last 24 hours) at 09/22/2021 0954 ?Last data filed at 09/22/2021 0753 ?Gross per 24 hour  ?Intake 103 ml  ?Output 3100 ml  ?Net -2997 ml  ? ? ? ?Physical Exam ? ?Awake Alert, No new F.N deficits, Normal affect ?[place].AT,PERRAL ?Supple Neck, No JVD,   ?Symmetrical Chest wall movement, Good air movement bilaterally, CTAB ?RRR,No Gallops,Rubs or new Murmurs,  ?+ve B.Sounds, Abd  Soft, No tenderness,   ?1+ edema  ?   ? ? Data Review:  ? ? ?CBC ?Recent Labs  ?Lab 09/18/21 ?1042 09/21/21 ?0200 09/22/21 ?0206  ?WBC 6.8 10.4 7.6  ?HGB 8.6* 8.5* 8.1*  ?HCT 26.8* 26.9* 25.7*  ?PLT 108* 130* 134*  ?MCV 93.1 95.4 93.8  ?MCH 29.9 30.1 29.6  ?MCHC 32.1 31.6 31.5  ?RDW 18.7* 17.9* 17.4*  ?LYMPHSABS 1.7 2.4  --   ?MONOABS 0.6 1.2*  --   ?EOSABS 0.2 0.2  --   ?BASOSABS 0.1 0.1  --   ? ? ?Electrolytes ?Recent Labs  ?Lab 09/18/21 ?1042 09/19/21 ?0920 09/21/21 ?0200 09/21/21 ?0336 09/21/21 ?1750 09/22/21 ?0206 09/22/21 ?0740  ?NA 139  --  136  --   --  137  --   ?K 3.9  --  3.9  --   --  3.9  --   ?CL 107  --  106  --   --  107  --   ?CO2 26  --  23  --   --  25  --   ?GLUCOSE 100* 91 114*  --   --  128*  --   ?BUN 15  --  9  --   --  9  --   ?CREATININE 0.77  --  0.77  --   --  0.85  --   ?CALCIUM 8.5*  --  8.4*  --   --  8.2*  --   ?AST 53*  --  51*  --   --  46*  --   ?ALT 23  --  23  --   --  23  --   ?ALKPHOS 154*  --  141*  --   --  108  --   ?BILITOT 1.1  --  1.1  --   --  1.1  --   ?ALBUMIN 3.3*  --  2.8*  --   --  2.5*  --   ?MG  --   --   --   --   --  2.0 2.2  ?PROCALCITON  --   --   --   --  0.16 0.20  --   ?LATICACIDVEN  --   --  1.2 1.3  --   --   --   ?TSH 2.564  --   --   --   --   --   --   ?AMMONIA  --   --   --   --   --  33  --   ?BNP  --   --  39.6  --   --  69.7  --   ? ? ?Radiology Reports ?CT Angio Chest PE W and/or Wo Contrast ? ?Result Date: 09/21/2021 ?CLINICAL DATA:  61 year old female with shortness of breath and tachycardia. Hemolytic anemia. EXAM: CT ANGIOGRAPHY CHEST WITH CONTRAST TECHNIQUE: Multidetector CT imaging of the chest was performed using the standard  protocol during bolus administration of intravenous contrast. Multiplanar CT image reconstructions and MIPs were obtained to evaluate the vascular anatomy. RADIATION DOSE REDUCTION: This exam was performed according to the departmental dose-optimization program which includes automated exposure control, adjustment of  the mA and/or kV according to patient size and/or use of iterative reconstruction technique. CONTRAST:  Total of 200 mL OMNIPAQUE IOHEXOL 350 MG/ML SOLN COMPARISON:  Portable chest 0144 hours. CT Chest, [place]

## 2021-09-22 NOTE — Telephone Encounter (Signed)
Patient at hospital now  ? ?Patient ?Name: ?Theresa Moran ?Y ?Gender: Female ?DOB: 10-10-1960 ?Age: 61 Y 25 D ?Return ?Phone ?Number: ?[phone] ?(Primary) ?Address: ?City/ ?State/ ?Zip: ?[place] ? [address] ?Client [place] at [place] [facility] - ?Clie ?Presenter, broadcasting at [place] [employer] ?Contact Type Call ?Who Is Calling Patient / Member / Family / Caregiver ?Call Type Triage / Clinical ?Caller Name Humberto Seals ?Relationship To Patient Spouse ?Return Phone Number [phone] (Primary) ?Chief Complaint Heart palpitations or irregular heartbeat ?Reason for Call Symptomatic / Request for Health Information ?Initial Comment Caller states that her spouse was seen a month ?ago and referred to a hematologist, they have been ?doing tests and transfusions. Her pulse has been ?running in the 110's so the dr referred her to a ?cardiologist. She has an appt with the cardiologist ?on monday but tonight her pulse is running around ?130 laying still. ?Translation No ?Nurse Assessment ?Nurse: Scales, RN, Debroah Loop Date/Time (Eastern Time): 09/20/2021 11:29:44 PM ?Confirm and document reason for call. If ?symptomatic, describe symptoms. ?---Caller states that her spouse has been seen ?by a hematologist a month ago, doing lots of ?bloodwork. Her H&H has been dropping and requiring ?transfusions. Referred to a cardiologist. HR has been ?running in the 110s. Now HR is 130 and pt is SOB at ?rest. ?Does the patient have any new or worsening ?symptoms? ---Yes ?Will a triage be completed? ---Yes ?Related visit to physician within the last 2 weeks? ---Yes ?Does the PT have any chronic conditions? (i.e. ?diabetes, asthma, this includes High risk factors for ?pregnancy, etc.) ?---No ?Is this a behavioral health or substance abuse call? ---No ?Guidelines ?Guideline Title Affirmed Question Affirmed Notes Nurse Date/Time (Eastern ?Time) ?Heart Rate and ?Heartbeat Questions ?Unable to walk, or ?can only walk  with ?Scales, RN, Debroah Loop 09/20/2021 11:32:34 ?PM ?PLEASE NOTE: All timestamps contained within this report are represented as [place] Standard Time. ?CONFIDENTIALTY NOTICE: This fax transmission is intended only for the addressee. It contains information that is legally privileged, confidential or ?otherwise protected from use or disclosure. If you are not the intended recipient, you are strictly prohibited from reviewing, disclosing, copying using ?or disseminating any of this information or taking any action in reliance on or regarding this information. If you have received this fax in error, please ?notify us immediately by telephone so that we can arrange for its return to [place]. Phone: [phone], Toll-Free: [phone], Fax: [phone] ?Page: 2 of 2 ?Call Id: [id number] ?Guidelines ?Guideline Title Affirmed Question Affirmed Notes Nurse Date/Time (Eastern ?Time) ?assistance (e.g., ?requires support) ?Disp. Time (Eastern ?Time) Disposition Final User ?09/20/2021 11:40:28 PM 911 Outcome Documentation Scales, RN, Debroah Loop ?Reason: Caller did not answer. Went to ?voicemail ?09/20/2021 11:34:37 PM Call EMS 911 Now Yes Scales, RN, Debroah Loop ?Caller Disagree/Comply Comply ?Caller Understands Yes ?PreDisposition InappropriateToAsk ?Care Advice Given Per Guideline ?CALL EMS 911 NOW: CARE ADVICE given per Heart Rate and Heartbeat Questions (Adult) guideline. ?Comments ?User: Napoleon Form, RN Date/Time Eilene Ghazi Time): 09/20/2021 11:33:04 PM ?Pt is c/o being cold ?Referrals ?GO TO FACILITY UNDECIDED ?

## 2021-09-22 NOTE — Consult Note (Addendum)
? ?                                                                          [facility] Consult: ?12:09 PM ?09/22/2021 ? LOS: 1 day  ? ? ?Referring Provider: Dr Candiss Norse  ?Primary Care Physician:  Tawnya Crook, MD [facility] ?Primary Gastroenterologist:  Dr. Tarri Glenn. ? ? ? ?Reason for Consultation: Volume overload, new diagnosis cirrhosis. ?  ?HPI: Theresa Moran is a 61 y.o. female.  PMH morbid obesity.  Previous cholecystectomy, appendectomy, breast reduction, nasal septal surgeries.  Peripheral neuropathy, chronic regional pain syndrome.  Anemia, followed by Dr Chryl Heck at the cancer center.  Underwent bone marrow biopsy on 09/15/21, Hgb 6.8 and treated with 2 PRBCs.   ? ?09/2010 colonoscopy by Dr. Delfin Edis.  This was average risk screening study.  Diminutive polyp 30 cm removed, path consistent with prolapse type polyp.  Mild pan diverticulosis.  Lipoma at 30 cm. ?Cologuard was negative in 2023. ? ?Recently has new diagnosis of cirrhosis with splenomegaly, thrombocytopenia and was seen for office visit with Dr. Tarri Glenn on 4/28.  A CT of 09/11/2021 showed cirrhosis, splenomegaly.  Gastric distention with either large amount of food debris or severe wall thickening in the body and distal portion of the stomach can concerning for inflammation or infiltrative disease such as lymphoma.  There was a 4 mm subpleural nodule in the right middle lobe of the lung.  MD plan was to check the usual labs to rule out viral, autoimmune and metabolic causes of liver disease along with FibroSure testing.  Patient complained of intermittent dysphagia and Dr. Tarri Glenn planned EGD, this was set up for 5/16 with Dr. Fuller Plan.  An MRI also ordered for 09/25/2021.  Pt reports no prior history of abnormal LFTs or liver disease.  No excessive or unusual bleeding, bruising.  No excessive alcohol intake. ? ?Presented to the ED with 3 weeks of swelling  in her legs and her left hand.  She has had cough productive of purulent greenish "gunk" for 3 days and did see some streaks of blood in her sputum yesterday.  She has periodic spells of feeling icy cold without sweats over the past month, these have not gotten any worse in recent days.  She has had problems with a sense that food gets stuck in her esophagus periodically, this has not worsened in recent weeks.  Her dysphagia has never been associated with coughing or gagging or sense that she is aspirated anything. Pt uses 600 mg ibuprofen about 3 nights a week. ? ?Hgb 8.1.  MCV 93.  Platelets 134.  WBC 7.6 ?4/28 iron was 32, ferritin 25.  TIBC, folate, B12 normal.  Iron sats low at 8.7% ?T. bili 1.1.  Alk phos 108.  AST/ALT 46/23.  Glucose 128, normal serum chemistries.  BNP 69.7. ?IgM, IgG, ANA negative.  Smooth muscle/mitochondrial antibodies in process.  Hep B core Ab, hepatitis B surface Ag in process.   ?Echocardiogram completed, not yet read. ?CT angio of chest required repeated vascular contrast bolus.  No PE seen.  Lung volumes reduced compared with 10 days prior and now with new small, layering pleural effusions and new multifocal bilateral lower lobe and right middle  lobe opacity possibly indicating multifocal pneumonia ?Abdominal ultrasound, Limited.  Status post cholecystectomy.  Cirrhotic appearing liver, no focal liver lesions but liver imaging limited by body habitus.  Portal vein Dopplers are normal.  CBD 6 mm. ? ?Patient's been receiving IV Lasix but was previously not on any diuretics.  Her blood pressure is low though not particularly symptomatic from this.  Measures of 70s/40s-50s currently. ? ?Retired pediatric physical therapist.  Married, lives with her wife.  She has 2 children and 4 grandchildren.  Does not smoke.  Not currently using alcohol products and no history of heavy use.  No history illicit drug use. ?Family history negative for gastric, intestinal diseases/cancers.  No history of  liver disease.  No history of anemia. ? ?Past Medical History:  ?Diagnosis Date  ? Allergy   ? Anxiety   ? Asthma   ? in the past  ? Chronic pain   ? Depression   ? Gallstones   ? GERD (gastroesophageal reflux disease)   ? Headache   ? stopped after menopause  ? Neuromuscular disorder ([place])   ? Urticaria   ? ? ?Past Surgical History:  ?Procedure Laterality Date  ? APPENDECTOMY  1979  ? BREAST REDUCTION SURGERY    ? BREAST SURGERY  1980  ? breast reduction  ? CHOLECYSTECTOMY    ? NASAL SEPTUM SURGERY    ? ORIF ANKLE FRACTURE Left 03/26/2021  ? Procedure: OPEN REDUCTION INTERNAL FIXATION (ORIF) ANKLE FRACTURE;  Surgeon: Armond Hang, MD;  Location: [place] [facility];  Service: Orthopedics;  Laterality: Left;  ? ? ?Prior to Admission medications   ?Medication Sig Start Date End Date Taking? Authorizing Provider  ?baclofen (LIORESAL) 10 MG tablet Take 10 mg by mouth 2 (two) times daily.   Yes [provider]  ?busPIRone (BUSPAR) 10 MG tablet Take 1 tablet (10 mg total) by mouth 2 (two) times daily. 02/12/20  Yes Jacelyn Pi, Lilia Argue, MD  ?gabapentin (NEURONTIN) 600 MG tablet Take 600 mg by mouth 2 (two) times daily.   Yes [provider]  ?ibuprofen (ADVIL) 600 MG tablet Take 600 mg by mouth every 6 (six) hours as needed for headache or moderate pain.   Yes [provider]  ?NUCYNTA ER 50 MG 12 hr tablet Take 50 mg by mouth daily. 09/12/21  Yes [provider]  ?nortriptyline (PAMELOR) 50 MG capsule Take 100 mg by mouth at bedtime. 08/27/21 11/25/21  [provider]  ? ? ?Scheduled Meds: ? baclofen  10 mg Oral BID  ? busPIRone  10 mg Oral BID  ? [START ON 09/23/2021] furosemide  40 mg Intravenous Daily  ? furosemide  40 mg Oral Once  ? gabapentin  600 mg Oral BID  ? guaiFENesin  600 mg Oral BID  ? heparin injection (subcutaneous)  5,000 Units Subcutaneous Q8H  ? nortriptyline  100 mg Oral QHS  ? pantoprazole  40 mg Oral Daily  ? potassium chloride  40 mEq Oral Once  ? sodium chloride  flush  3 mL Intravenous Q12H  ? tapentadol  50 mg Oral Daily  ? ?Infusions: ? azithromycin    ? cefTRIAXone (ROCEPHIN)  IV 2 g (09/22/21 0901)  ? ?PRN Meds: ?acetaminophen **OR** acetaminophen, albuterol ? ? ?Allergies as of 09/21/2021 - Review Complete 09/21/2021  ?Allergen Reaction Noted  ? Shellfish allergy Anaphylaxis, Hives, Itching, Shortness Of Breath, and Swelling 12/18/2019  ? Shellfish-derived products  09/11/2010  ? ? ?Family History  ?Problem Relation Age of Onset  ?  Intellectual disability Mother   ? Hypertension Mother   ? Hyperlipidemia Mother   ? Depression Mother   ? Arthritis Mother   ? Mental illness Mother   ? Heart disease Father   ? Diabetes Father   ? Stroke Father   ? Heart disease Brother   ? COPD Brother   ? Mental illness Daughter   ? Learning disabilities Daughter   ? Diabetes Daughter   ? Allergic rhinitis Neg Hx   ? Angioedema Neg Hx   ? Asthma Neg Hx   ? Eczema Neg Hx   ? Immunodeficiency Neg Hx   ? Urticaria Neg Hx   ? Colon cancer Neg Hx   ? Pancreatic cancer Neg Hx   ? Liver cancer Neg Hx   ? Stomach cancer Neg Hx   ? ? ?Social History  ? ?Socioeconomic History  ? Marital status: Married  ?  Spouse name: Not on file  ? Number of children: Not on file  ? Years of education: Not on file  ? Highest education level: Not on file  ?Occupational History  ? Not on file  ?Tobacco Use  ? Smoking status: Former  ?  Packs/day: 1.00  ?  Years: 15.00  ?  Pack years: 15.00  ?  Types: Cigarettes  ?  Quit date: 01/2004  ?  Years since quitting: 17.6  ? Smokeless tobacco: Never  ?Vaping Use  ? Vaping Use: Never used  ?Substance and Sexual Activity  ? Alcohol use: Not Currently  ? Drug use: No  ? Sexual activity: Not Currently  ?Other Topics Concern  ? Not on file  ?Social History Narrative  ? Has 1 child(artificial insem), 2 adopted, and 1 step from partner's former partner  ? Raising 4 grandchildren.  ? ?Social Determinants of Health  ? ?Financial Resource Strain: Not on file  ?Food Insecurity: Not  on file  ?Transportation Needs: Not on file  ?Physical Activity: Not on file  ?Stress: Not on file  ?Social Connections: Not on file  ?Intimate Partner Violence: Not on file  ? ? ?REVIEW OF SYSTEMS: ?Cons

## 2021-09-22 NOTE — Progress Notes (Addendum)
HEMATOLOGY-ONCOLOGY PROGRESS NOTE ? ?ASSESSMENT AND PLAN: ?This is a pleasant 61 year old female patient with past medical history significant for neuropathy anxiety/depression referred to hematology for evaluation of progressive thrombocytopenia.   ?  ?On evaluation of labs, she was found to have progressive anemia, nonimmune mediated hemolysis with reticulocyte count around 5%.  No elevated LDH or total bilirubin.  DAT test is negative, there appears to be some complement consumption.  Given no clear etiology for the nonimmune mediated hemolytic anemia, recommended imaging which showed hepatic cirrhosis and splenomegaly, diffuse thickening of the stomach concerning for infiltrative diseases such as lymphoma or inflammation.  She was referred to GI and seen as an outpatient.  They recommended endoscopy which is currently scheduled later this month.  MRI of the liver was also recommended which has not yet been completed. ? ?Prior work-up has been reviewed.  Her PNH panel was normal.  DAT was negative.  SPEP was without evidence of myeloma.  Cold agglutinin titer was negative.  Immunoglobulin levels were unremarkable except for an elevated IgA.  Bone marrow biopsy was unrevealing.  Her iron studies were again reviewed.  Her ferritin level has dropped from 72 down to 25 in only 2 weeks.  She has not seen any obvious bleeding.  I have recommended that we proceed with 1 dose of Feraheme 510 mg IV.  Discussed the potential adverse effects of this medication and she agrees to proceed.  Recommend PRBC transfusion for hemoglobin less than 7. ? ?Recommend GI evaluation and would recommend upper endoscopy and possible colonoscopy given significant drop in ferritin level and only 2 weeks.   ? ?[name], DNP, AGPCNP-BC, AOCNP ? ?SUBJECTIVE: Theresa Moran is followed by our office for hemolytic anemia.  She has been undergoing work-up.  She was recently found to have cirrhosis and splenomegaly.  Was referred to GI and they are  planning for upper endoscopy.  She had a bone marrow biopsy performed which showed hypercellular marrow with erythroid hyperplasia and there was not a definitive cause identified for her cytopenias.  She presented to the emergency department with lower extremity edema and dyspnea.  She has suspected community-acquired pneumonia was started on empiric antibiotics.  She is also going additional work-up for her fluid overload and tachycardia.  GI has been consulted. ? ?Today, the patient reports that she still has a lot of fatigue.  She has ongoing lower extremity edema.  She is not complaining of any chest pain or shortness of breath at rest.  Denies abdominal pain, nausea, vomiting.  No bleeding reported. ? ?REVIEW OF SYSTEMS:   ?Review of Systems  ?Constitutional:  Negative for chills and fever.  ?HENT: Negative.    ?Respiratory:  Negative for cough.   ?Cardiovascular:  Positive for leg swelling. Negative for chest pain.  ?Skin: Negative.   ?Neurological: Negative.   ?Psychiatric/Behavioral: Negative.    ? ?I have reviewed the past medical history, past surgical history, social history and family history with the patient and they are unchanged from previous note. ? ? ?PHYSICAL EXAMINATION: ? ?Vitals:  ? 09/22/21 0749 09/22/21 1156  ?BP: (!) 128/59 (!) 108/47  ?Pulse: (!) 105 (!) 106  ?Resp: 19 20  ?Temp: 97.6 ?F (36.4 ?C) 97.8 ?F (36.6 ?C)  ?SpO2: 97% 97%  ? ?Filed Weights  ? 09/21/21 0107  ?Weight: (!) 147.4 kg  ? ? ?Intake/Output from previous day: ?04/30 0701 - 05/01 0700 ?In: 103 [I.V.:3; IV Piggyback:100] ?Out: 2200 [Urine:2200] ? ?Physical Exam ?Vitals reviewed.  ?Constitutional:   ?  General: She is not in acute distress. ?HENT:  ?   Head: Normocephalic.  ?Eyes:  ?   General: No scleral icterus. ?   Conjunctiva/sclera: Conjunctivae normal.  ?Pulmonary:  ?   Effort: Pulmonary effort is normal. No respiratory distress.  ?Abdominal:  ?   General: There is no distension.  ?   Palpations: Abdomen is soft.   ?Musculoskeletal:     ?   General: Swelling present.  ?Skin: ?   General: Skin is warm and dry.  ?Neurological:  ?   Mental Status: She is alert.  ? ? ?LABORATORY DATA:  ?I have reviewed the data as listed ? ?  Latest Ref Rng & Units 09/22/2021  ?  2:06 AM 09/21/2021  ?  2:00 AM 09/19/2021  ?  9:20 AM  ?CMP  ?Glucose 70 - 99 mg/dL 128   114   91    ?BUN 8 - 23 mg/dL 9   9     ?Creatinine 0.44 - 1.00 mg/dL 0.85   0.77     ?Sodium 135 - 145 mmol/L 137   136     ?Potassium 3.5 - 5.1 mmol/L 3.9   3.9     ?Chloride 98 - 111 mmol/L 107   106     ?CO2 22 - 32 mmol/L 25   23     ?Calcium 8.9 - 10.3 mg/dL 8.2   8.4     ?Total Protein 6.5 - 8.1 g/dL 5.9   6.3     ?Total Bilirubin 0.3 - 1.2 mg/dL 1.1   1.1     ?Alkaline Phos 38 - 126 U/L 108   141     ?AST 15 - 41 U/L 46   51     ?ALT 0 - 44 U/L 23   23     ? ? ?Lab Results  ?Component Value Date  ? WBC 7.6 09/22/2021  ? HGB 8.1 (L) 09/22/2021  ? HCT 25.7 (L) 09/22/2021  ? MCV 93.8 09/22/2021  ? PLT 134 (L) 09/22/2021  ? NEUTROABS 6.5 09/21/2021  ? ? ?No results found for: CEA1, CEA, [id number], CA125, PSA1 ? ?CT Angio Chest PE W and/or Wo Contrast ? ?Result Date: 09/21/2021 ?CLINICAL DATA:  61 year old female with shortness of breath and tachycardia. Hemolytic anemia. EXAM: CT ANGIOGRAPHY CHEST WITH CONTRAST TECHNIQUE: Multidetector CT imaging of the chest was performed using the standard protocol during bolus administration of intravenous contrast. Multiplanar CT image reconstructions and MIPs were obtained to evaluate the vascular anatomy. RADIATION DOSE REDUCTION: This exam was performed according to the departmental dose-optimization program which includes automated exposure control, adjustment of the mA and/or kV according to patient size and/or use of iterative reconstruction technique. CONTRAST:  Total of 200 mL OMNIPAQUE IOHEXOL 350 MG/ML SOLN COMPARISON:  Portable chest 0144 hours. CT Chest, Abdomen, and Pelvis 09/11/2021. FINDINGS: Cardiovascular: Largely inadequate initial  bolus repeated contrast bolus timing in the pulmonary arterial tree. At 0716 hours and is adequate. Mild respiratory motion. No pulmonary artery filling defect identified. Calcified coronary artery atherosclerosis evident on series 7, image 79. Minimal thoracic aortic atherosclerosis. No cardiomegaly or pericardial effusion. Mediastinum/Nodes: No mediastinal mass or lymphadenopathy. Lungs/Pleura: Lower lung volumes with atelectatic changes to the major airways today including the carina. Small bilateral layering pleural effusions. Confluent new multifocal lower lobe peribronchial opacity with early consolidation, greater on the right. Lingula atelectasis appears stable but there is new right middle lobe lateral segment peribronchial nodularity which appears inflammatory. No middle lobe consolidation.  Simple atelectasis in the upper lobes. Upper Abdomen: Stable visible upper abdomen since 09/11/2021; nodular cirrhotic liver. No ascites. On the later images contrast is being excreted to both renal collecting systems. Musculoskeletal: No acute osseous abnormality identified. Review of the MIP images confirms the above findings. IMPRESSION: 1. Vascular contrast bolus had to be repeated. No pulmonary embolus identified. 2. Lower lung volumes from 10 days ago with small new layering pleural effusions and new with multifocal bilateral lower lobe and right middle lobe opacity most resembling Multifocal Pneumonia. 3. Cirrhosis.  Calcified coronary artery atherosclerosis. Electronically Signed   By: Genevie Ann M.D.   On: 09/21/2021 07:47  ? ?CT CHEST ABDOMEN PELVIS W CONTRAST ? ?Result Date: 09/12/2021 ?CLINICAL DATA:  New onset hemolytic anemia. EXAM: CT CHEST, ABDOMEN, AND PELVIS WITH CONTRAST TECHNIQUE: Multidetector CT imaging of the chest, abdomen and pelvis was performed following the standard protocol during bolus administration of intravenous contrast. RADIATION DOSE REDUCTION: This exam was performed according to the  departmental dose-optimization program which includes automated exposure control, adjustment of the mA and/or kV according to patient size and/or use of iterative reconstruction technique. CONTRAST:  162m OM

## 2021-09-23 ENCOUNTER — Encounter (HOSPITAL_COMMUNITY): Payer: Self-pay | Admitting: Hematology and Oncology

## 2021-09-23 DIAGNOSIS — R1310 Dysphagia, unspecified: Secondary | ICD-10-CM

## 2021-09-23 DIAGNOSIS — J189 Pneumonia, unspecified organism: Secondary | ICD-10-CM | POA: Diagnosis not present

## 2021-09-23 DIAGNOSIS — R600 Localized edema: Secondary | ICD-10-CM | POA: Diagnosis not present

## 2021-09-23 DIAGNOSIS — K7460 Unspecified cirrhosis of liver: Secondary | ICD-10-CM | POA: Diagnosis not present

## 2021-09-23 LAB — COMPREHENSIVE METABOLIC PANEL
ALT: 22 U/L (ref 0–44)
AST: 47 U/L — ABNORMAL HIGH (ref 15–41)
Albumin: 2.5 g/dL — ABNORMAL LOW (ref 3.5–5.0)
Alkaline Phosphatase: 97 U/L (ref 38–126)
Anion gap: 7 (ref 5–15)
BUN: 10 mg/dL (ref 8–23)
CO2: 25 mmol/L (ref 22–32)
Calcium: 8.4 mg/dL — ABNORMAL LOW (ref 8.9–10.3)
Chloride: 106 mmol/L (ref 98–111)
Creatinine, Ser: 0.94 mg/dL (ref 0.44–1.00)
GFR, Estimated: >60 mL/min (ref >60)
Glucose, Bld: 84 mg/dL (ref 70–99)
Potassium: 3.5 mmol/L (ref 3.5–5.1)
Sodium: 138 mmol/L (ref 135–145)
Total Bilirubin: 0.8 mg/dL (ref 0.3–1.2)
Total Protein: 5.9 g/dL — ABNORMAL LOW (ref 6.5–8.1)

## 2021-09-23 LAB — CBC WITH DIFFERENTIAL/PLATELET
Abs Immature Granulocytes: 0.02 10*3/uL (ref 0.00–0.07)
Basophils Absolute: 0.1 10*3/uL (ref 0.0–0.1)
Basophils Relative: 1 %
Eosinophils Absolute: 0.3 10*3/uL (ref 0.0–0.5)
Eosinophils Relative: 3 %
HCT: 25.1 % — ABNORMAL LOW (ref 36.0–46.0)
Hemoglobin: 7.9 g/dL — ABNORMAL LOW (ref 12.0–15.0)
Immature Granulocytes: 0 %
Lymphocytes Relative: 38 %
Lymphs Abs: 3 10*3/uL (ref 0.7–4.0)
MCH: 29.5 pg (ref 26.0–34.0)
MCHC: 31.5 g/dL (ref 30.0–36.0)
MCV: 93.7 fL (ref 80.0–100.0)
Monocytes Absolute: 0.7 10*3/uL (ref 0.1–1.0)
Monocytes Relative: 9 %
Neutro Abs: 3.9 10*3/uL (ref 1.7–7.7)
Neutrophils Relative %: 49 %
Platelets: 148 10*3/uL — ABNORMAL LOW (ref 150–400)
RBC: 2.68 MIL/uL — ABNORMAL LOW (ref 3.87–5.11)
RDW: 17.3 % — ABNORMAL HIGH (ref 11.5–15.5)
WBC: 8 10*3/uL (ref 4.0–10.5)
nRBC: 0 % (ref 0.0–0.2)

## 2021-09-23 LAB — PROCALCITONIN: Procalcitonin: <0.1 ng/mL

## 2021-09-23 LAB — BRAIN NATRIURETIC PEPTIDE: B Natriuretic Peptide: 42.1 pg/mL (ref 0.0–100.0)

## 2021-09-23 LAB — MAGNESIUM: Magnesium: 2.1 mg/dL (ref 1.7–2.4)

## 2021-09-23 MED ORDER — PEG-KCL-NACL-NASULF-NA ASC-C 100 G PO SOLR
0.5000 | Freq: Once | ORAL | Status: AC
  Administered 2021-09-24: 100 g via ORAL
  Filled 2021-09-23: qty 1

## 2021-09-23 MED ORDER — MIDODRINE HCL 5 MG PO TABS
5.0000 mg | ORAL_TABLET | Freq: Three times a day (TID) | ORAL | Status: DC
  Administered 2021-09-23 – 2021-09-28 (×17): 5 mg via ORAL
  Filled 2021-09-23 (×17): qty 1

## 2021-09-23 MED ORDER — BISACODYL 5 MG PO TBEC: 20.0000 mg | DELAYED_RELEASE_TABLET | Freq: Once | ORAL | Status: DC

## 2021-09-23 MED ORDER — PEG-KCL-NACL-NASULF-NA ASC-C 100 G PO SOLR
0.5000 | Freq: Once | ORAL | Status: DC
  Filled 2021-09-23: qty 1

## 2021-09-23 MED ORDER — SPIRONOLACTONE 25 MG PO TABS
100.0000 mg | ORAL_TABLET | Freq: Every day | ORAL | Status: DC
  Administered 2021-09-24 – 2021-09-25 (×2): 100 mg via ORAL
  Filled 2021-09-23 (×2): qty 4

## 2021-09-23 MED ORDER — TAPENTADOL HCL 50 MG PO TABS
50.0000 mg | ORAL_TABLET | Freq: Every day | ORAL | Status: DC
  Administered 2021-09-23 – 2021-09-27 (×5): 50 mg via ORAL
  Filled 2021-09-23 (×5): qty 1

## 2021-09-23 MED ORDER — SPIRONOLACTONE 25 MG PO TABS
75.0000 mg | ORAL_TABLET | Freq: Once | ORAL | Status: AC
  Administered 2021-09-23: 75 mg via ORAL
  Filled 2021-09-23: qty 3

## 2021-09-23 MED ORDER — SPIRONOLACTONE 25 MG PO TABS
25.0000 mg | ORAL_TABLET | Freq: Every day | ORAL | Status: DC
Start: 2021-09-23 — End: 2021-09-23
  Administered 2021-09-23: 25 mg via ORAL
  Filled 2021-09-23: qty 1

## 2021-09-23 MED ORDER — TAPENTADOL HCL ER 50 MG PO TB12: 50.0000 mg | ORAL_TABLET | Freq: Every day | ORAL | Status: DC

## 2021-09-23 MED ORDER — METOCLOPRAMIDE HCL 5 MG/ML IJ SOLN
10.0000 mg | Freq: Four times a day (QID) | INTRAMUSCULAR | Status: AC
  Administered 2021-09-25: 10 mg via INTRAVENOUS
  Filled 2021-09-23 (×2): qty 2

## 2021-09-23 MED ORDER — PEG-KCL-NACL-NASULF-NA ASC-C 100 G PO SOLR
1.0000 | Freq: Once | ORAL | Status: DC
Start: 2021-09-24 — End: 2021-09-23

## 2021-09-23 MED ORDER — POTASSIUM CHLORIDE CRYS ER 20 MEQ PO TBCR
40.0000 meq | EXTENDED_RELEASE_TABLET | Freq: Every day | ORAL | Status: DC
  Administered 2021-09-23 – 2021-09-26 (×4): 40 meq via ORAL
  Filled 2021-09-23 (×4): qty 2

## 2021-09-23 MED ORDER — FUROSEMIDE 40 MG PO TABS
80.0000 mg | ORAL_TABLET | Freq: Every day | ORAL | Status: DC
  Administered 2021-09-23 – 2021-09-25 (×3): 80 mg via ORAL
  Filled 2021-09-23 (×3): qty 2

## 2021-09-23 MED ORDER — SODIUM CHLORIDE 0.9 % IV SOLN
510.0000 mg | Freq: Once | INTRAVENOUS | Status: AC
  Administered 2021-09-23: 510 mg via INTRAVENOUS
  Filled 2021-09-23: qty 17

## 2021-09-23 MED ORDER — TAPENTADOL HCL ER 50 MG PO TB12
50.0000 mg | ORAL_TABLET | Freq: Every day | ORAL | Status: DC
Start: 2021-09-23 — End: 2021-09-23
  Filled 2021-09-23: qty 1

## 2021-09-23 MED ORDER — METOCLOPRAMIDE HCL 5 MG/ML IJ SOLN: 10.0000 mg | Freq: Four times a day (QID) | INTRAMUSCULAR | Status: DC

## 2021-09-23 MED ORDER — PEG-KCL-NACL-NASULF-NA ASC-C 100 G PO SOLR
0.5000 | Freq: Once | ORAL | Status: AC
Start: 2021-09-24 — End: 2021-09-24
  Administered 2021-09-24: 100 g via ORAL
  Filled 2021-09-23: qty 1

## 2021-09-23 NOTE — Progress Notes (Signed)
?                                  PROGRESS NOTE                                             ?                                                                                                                     ?                                         ? ? Patient Demographics:  ? ? Theresa Moran, is a 61 y.o. female, DOB - 1960/09/23, TXM:[id number] ? ?Outpatient Primary MD for the patient is Tawnya Crook, MD    LOS - 2  Admit date - 09/21/2021   ? ?Chief Complaint  ?Patient presents with  ? Shortness of Breath  ?  BIBA from home, past few weeks Hgb and Hct have been low. Had X2 blood transfusions at [facility], pt states scheduled for blood transfusion on Monday. Productive cough X 3 days  ?    ? ?Brief Narrative (HPI from H&P)  61 y.o. female with medical history significant of neuropathy, anxiety/depression, hemolytic anemia, and thrombocytopenia who presents with complaints of shortness of breath.  She notes that everything is happened over the last 3 weeks. Patient has been being worked up in outpatient setting by oncology in regards to thrombocytopenia and anemia  Underwent bone marrow biopsy on 4/24 that noted hypercellular marrow with erythroid hyperplasia.  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  Patient had been referred to GI and is scheduled for EGD on 5/16. she has flow cytometry and other studies still pending.  6 days ago, she was transfused 2 units of packed red blood cells due to hemoglobin dropping down to 6.8 g/dL. ? ?Presented to the hospital with shortness of breath she was diagnosed with community-acquired pneumonia along with fluid overload and admitted to the hospital. ? ? ? Subjective:  ? ?Patient in bed, appears comfortable, denies any headache, no fever, no chest pain or pressure, improved cough and  shortness of breath , no abdominal pain. No new focal weakness. ? ? Assessment  & Plan :  ? ? ?CAP - with productive cough and right-sided infiltrate, monitor cultures has been placed on appropriate empiric antibiotics which will be continued, since infiltrate is predominantly on the right side we have involved speech as well.  Continue to monitor, improving clinically. ? ?2.  Morbid obesity with recent suspicion  of hepatic cirrhosis on outpatient imaging.  Question if she has underlying NASH, GI team following contemplating EGD and colonoscopy later this admission, right upper quadrant ultrasound confirms cirrhosis, cute hepatitis panel unremarkable. ? ?3.  Recent diagnosis of hemolytic anemia, chronic thrombocytopenia.  Under the care of oncologist Dr.Iruku, outpatient work-up being done she has undergone bone marrow biopsy with final results pending, oncology team is following. ? ?4.  Anxiety and depression.  Continue home regimen which includes BuSpar and nortriptyline. ? ?5.  Fluid overload.  Stable echocardiogram, continue diuresis with Lasix and Aldactone monitor electrolytes closely. ? ?6.  Persistent tachycardia, CTA stable, echocardiogram along with TSH nonacute and stable,  question if her hemolytic anemia is causing a high output state with underlying tachycardia. ? ?   ? ?Condition - Fair ? ?Family Communication  :  None present ? ?Code Status :  Full ? ?Consults  : GI, oncology ? ?PUD Prophylaxis : PPI ? ? Procedures  :    ? ?TTE - 1. Left ventricular ejection fraction, by estimation, is 60 to 65%. The left ventricle has normal function. The left ventricle has no regional wall motion abnormalities. Left ventricular diastolic parameters were normal.  2. Right ventricular systolic function is normal. The right ventricular size is normal. There is moderately elevated pulmonary artery systolic pressure.  3. Left atrial size was mildly dilated.  4. The mitral valve is grossly normal. Mild mitral valve  regurgitation. No evidence of mitral stenosis.  5. The aortic valve is grossly normal. There is mild calcification of the aortic valve. There is mild thickening of the aortic valve. Aortic valve regurgitation is not visualized. Aortic valve sclerosis/calcification is present, without any evidence of aortic stenosis.  6. The inferior vena cava is dilated in size with >50% respiratory variability, suggesting right atrial pressure of 8 mmHg. ? ?RUQ [place] - Status post cholecystectomy. Findings consistent with hepatic cirrhosis. No definite focal sonographic hepatic abnormality is noted, although imaging is somewhat limited due to body habitus. ? ?CTA -  1. Vascular contrast bolus had to be repeated. No pulmonary embolus identified. 2. Lower lung volumes from 10 days ago with small new layering pleural effusions and new with multifocal bilateral lower lobe and right middle lobe opacity most resembling Multifocal Pneumonia. 3. Cirrhosis.  Calcified coronary artery atherosclerosis ? ?   ? ?Disposition Plan  :   ? ?Status is: Inpatient ? ?DVT Prophylaxis  :   ? ?heparin injection 5,000 Units Start: 09/22/21 1400 ?Place TED hose Start: 09/22/21 0555 ?SCDs Start: 09/21/21 0955 ?  ? ?Lab Results  ?Component Value Date  ? PLT 148 (L) 09/23/2021  ? ? ?Diet :  ?Diet Order   ? ?       ?  Diet Heart Room service appropriate? Yes; Fluid consistency: Thin; Fluid restriction: 1500 mL Fluid  Diet effective now       ?  ? ?  ?  ? ?  ?  ? ?Inpatient Medications ? ?Scheduled Meds: ? baclofen  10 mg Oral BID  ? busPIRone  10 mg Oral BID  ? furosemide  80 mg Oral Daily  ? gabapentin  600 mg Oral BID  ? guaiFENesin  600 mg Oral BID  ? heparin injection (subcutaneous)  5,000 Units Subcutaneous Q8H  ? midodrine  5 mg Oral TID WC  ? nortriptyline  100 mg Oral QHS  ? pantoprazole  40 mg Oral Daily  ? potassium chloride  40 mEq Oral Daily  ? sodium  chloride flush  3 mL Intravenous Q12H  ? spironolactone  25 mg Oral Daily  ? tapentadol  50 mg Oral  Daily  ? ?Continuous Infusions: ? azithromycin Stopped (09/22/21 1340)  ? cefTRIAXone (ROCEPHIN)  IV 2 g (09/23/21 1000)  ? ferumoxytol    ? ?PRN Meds:.acetaminophen **OR** acetaminophen, albuterol ? ?Time Spent in minutes  30 ? ? ?Lala Lund M.D on 09/23/2021 at 10:19 AM ? ?To page go to [email or web address]  ? ?[facility] -  Office  [phone] ? ?See all Orders from today for further details ? ? ? Objective:  ? ?Vitals:  ? 09/23/21 0329 09/23/21 0354 09/23/21 0803 09/23/21 0900  ?BP: (!) 98/50  114/65 (!) 114/55  ?Pulse: 100  (!) 112 (!) 112  ?Resp: '16  20 16  ' ?Temp: 97.8 ?F (36.6 ?C)  97.7 ?F (36.5 ?C) 97.7 ?F (36.5 ?C)  ?TempSrc: Oral  Oral Oral  ?SpO2: 95%  97% 94%  ?Weight:  (!) 139.2 kg    ?Height:      ? ? ?Wt Readings from Last 3 Encounters:  ?09/23/21 (!) 139.2 kg  ?09/19/21 (!) 147 kg  ?09/18/21 (!) 147 kg  ? ? ? ?Intake/Output Summary (Last 24 hours) at 09/23/2021 1019 ?Last data filed at 09/23/2021 0981 ?Gross per 24 hour  ?Intake 590 ml  ?Output 900 ml  ?Net -310 ml  ? ? ? ?Physical Exam ? ?Awake Alert, No new F.N deficits, Normal affect ?[place].AT,PERRAL ?Supple Neck, No JVD,   ?Symmetrical Chest wall movement, Good air movement bilaterally, CTAB ?RRR,No Gallops, Rubs or new Murmurs,  ?+ve B.Sounds, Abd Soft, No tenderness,   ?1+ edema but better than yesterday   ? ? Data Review:  ? ? ?CBC ?Recent Labs  ?Lab 09/18/21 ?1042 09/21/21 ?0200 09/22/21 ?0206 09/23/21 ?0154  ?WBC 6.8 10.4 7.6 8.0  ?HGB 8.6* 8.5* 8.1* 7.9*  ?HCT 26.8* 26.9* 25.7* 25.1*  ?PLT 108* 130* 134* 148*  ?MCV 93.1 95.4 93.8 93.7  ?MCH 29.9 30.1 29.6 29.5  ?MCHC 32.1 31.6 31.5 31.5  ?RDW 18.7* 17.9* 17.4* 17.3*  ?LYMPHSABS 1.7 2.4  --  3.0  ?MONOABS 0.6 1.2*  --  0.7  ?EOSABS 0.2 0.2  --  0.3  ?BASOSABS 0.1 0.1  --  0.1  ? ? ?Electrolytes ?Recent Labs  ?Lab 09/18/21 ?1042 09/19/21 ?0920 09/21/21 ?0200 09/21/21 ?1914 09/21/21 ?1750 09/22/21 ?0206 09/22/21 ?0740 09/22/21 ?7829 09/23/21 ?0154  ?NA 139  --  136  --   --  137  --   --  138  ?K  3.9  --  3.9  --   --  3.9  --   --  3.5  ?CL 107  --  106  --   --  107  --   --  106  ?CO2 26  --  23  --   --  25  --   --  25  ?GLUCOSE 100* 91 114*  --   --  128*  --   --  84  ?BUN 15  --  9  --   -

## 2021-09-23 NOTE — Progress Notes (Addendum)
? ?       Daily Rounding Note ? ?09/23/2021, 10:27 AM ? LOS: 2 days  ? ?SUBJECTIVE:   ?Chief complaint:   Cirrhosis of liver.  ascites  ? ?BPs still running low to 90s/40s-50s overnight.  Now 1teens/ 50s-60s.  HR currently 112.   Excellent sats in mid to upper 90s on RA. ? ?Midodrine added this AM along w aldactone added to now po lasix.   ? ?Stools are brown.  Tolerating solid foods. ?Breathing improved but not quite at baseline in terms of sense of dyspnea.  Still with productive cough though not as bad as previous days.  Overall feeling pretty good. ? ?OBJECTIVE:        ? Vital signs in last 24 hours:    ?Temp:  [97.7 ?F (36.5 ?C)-97.9 ?F (36.6 ?C)] 97.7 ?F (36.5 ?C) (05/02 0900) ?Pulse Rate:  [100-112] 112 (05/02 0900) ?Resp:  [15-20] 16 (05/02 0900) ?BP: (96-115)/(43-65) 114/55 (05/02 0900) ?SpO2:  [94 %-99 %] 94 % (05/02 0900) ?Weight:  [139.2 kg] 139.2 kg (05/02 0354) ?Last BM Date : 09/21/21 ?Filed Weights  ? 09/21/21 0107 09/23/21 0354  ?Weight: (!) 147.4 kg (!) 139.2 kg  ? ?General: Looks well.  Comfortable.  No distress. ?Heart: RRR. ?Chest: No labored breathing.  No cough.  Some fine crackles at the right base otherwise clear. ?Abdomen: Obese, soft, not tender or distended. ?Extremities: No CCE. ?Neuro/Psych: Very pleasant, calm.  No gross deficits.  No tremor/asterixis. ? ?Intake/Output from previous day: ?05/01 0701 - 05/02 0700 ?In: 54 [P.O.:480; IV Piggyback:350] ?Out: 1800 [Urine:1800] ? ?Intake/Output this shift: ?No intake/output data recorded. ? ?Lab Results: ?Recent Labs  ?  09/21/21 ?0200 09/22/21 ?0206 09/23/21 ?0154  ?WBC 10.4 7.6 8.0  ?HGB 8.5* 8.1* 7.9*  ?HCT 26.9* 25.7* 25.1*  ?PLT 130* 134* 148*  ? ?BMET ?Recent Labs  ?  09/21/21 ?0200 09/22/21 ?0206 09/23/21 ?0154  ?NA 136 137 138  ?K 3.9 3.9 3.5  ?CL 106 107 106  ?CO2 '23 25 25  '$ ?GLUCOSE 114* 128* 84  ?BUN '9 9 10  '$ ?CREATININE 0.77 0.85 0.94  ?CALCIUM 8.4* 8.2* 8.4*  ? ?LFT ?Recent Labs   ?  09/21/21 ?0200 09/22/21 ?0206 09/23/21 ?0154  ?PROT 6.3* 5.9* 5.9*  ?ALBUMIN 2.8* 2.5* 2.5*  ?AST 51* 46* 47*  ?ALT '23 23 22  '$ ?ALKPHOS 141* 108 97  ?BILITOT 1.1 1.1 0.8  ? ?PT/INR ?Recent Labs  ?  09/22/21 ?0959  ?LABPROT 18.2*  ?INR 1.5*  ? ?Hepatitis Panel ?Recent Labs  ?  09/22/21 ?0740  ?HEPBSAG NON REACTIVE  ?HCVAB NON REACTIVE  ?HEPAIGM NON REACTIVE  ?HEPBIGM NON REACTIVE  ? ? ?Studies/Results: ?ECHOCARDIOGRAM COMPLETE ? ?Result Date: 09/22/2021 ?   ECHOCARDIOGRAM REPORT   Patient Name:   Theresa Moran Date of Exam: 09/22/2021 Medical Rec #:  [id number]    Height:       66.0 in Accession #:    [id number]   Weight:       325.0 lb Date of Birth:  1964/05/23     BSA:          2.458 m? Patient Age:    61 years     BP:           128/59 mmHg Patient Gender: F            HR:           114 bpm. Exam Location:  Inpatient Procedure: 2D Echo, 3D Echo, Cardiac  Doppler and Color Doppler Indications:    Hemolytic amemia.; R06.9 DOE  History:        Patient has no prior history of Echocardiogram examinations.                 Abnormal ECG; Arrythmias:Tachycardia.  Sonographer:    Roseanna Rainbow RDCS Referring Phys: [id number] RONDELL A SMITH  Sonographer Comments: Technically difficult study due to poor echo windows and patient is morbidly obese. Image acquisition challenging due to patient body habitus. IMPRESSIONS  1. Left ventricular ejection fraction, by estimation, is 60 to 65%. The left ventricle has normal function. The left ventricle has no regional wall motion abnormalities. Left ventricular diastolic parameters were normal.  2. Right ventricular systolic function is normal. The right ventricular size is normal. There is moderately elevated pulmonary artery systolic pressure.  3. Left atrial size was mildly dilated.  4. The mitral valve is grossly normal. Mild mitral valve regurgitation. No evidence of mitral stenosis.  5. The aortic valve is grossly normal. There is mild calcification of the aortic valve. There is mild  thickening of the aortic valve. Aortic valve regurgitation is not visualized. Aortic valve sclerosis/calcification is present, without any evidence of aortic stenosis.  6. The inferior vena cava is dilated in size with >50% respiratory variability, suggesting right atrial pressure of 8 mmHg. Comparison(s): No prior Echocardiogram. Conclusion(s)/Recommendation(s): Otherwise normal echocardiogram, with minor abnormalities described in the report. FINDINGS  Left Ventricle: Left ventricular ejection fraction, by estimation, is 60 to 65%. The left ventricle has normal function. The left ventricle has no regional wall motion abnormalities. The left ventricular internal cavity size was normal in size. There is  no left ventricular hypertrophy. Left ventricular diastolic parameters were normal. Right Ventricle: The right ventricular size is normal. Right vetricular wall thickness was not well visualized. Right ventricular systolic function is normal. There is moderately elevated pulmonary artery systolic pressure. The tricuspid regurgitant velocity is 3.08 m/s, and with an assumed right atrial pressure of 8 mmHg, the estimated right ventricular systolic pressure is 85.8 mmHg. Left Atrium: Left atrial size was mildly dilated. Right Atrium: Right atrial size was normal in size. Pericardium: There is no evidence of pericardial effusion. Mitral Valve: The mitral valve is grossly normal. Mild mitral valve regurgitation. No evidence of mitral valve stenosis. MV peak gradient, 18.1 mmHg. The mean mitral valve gradient is 8.0 mmHg. Tricuspid Valve: The tricuspid valve is normal in structure. Tricuspid valve regurgitation is trivial. No evidence of tricuspid stenosis. Aortic Valve: The aortic valve is grossly normal. There is mild calcification of the aortic valve. There is mild thickening of the aortic valve. Aortic valve regurgitation is not visualized. Aortic valve sclerosis/calcification is present, without any evidence of  aortic stenosis. Aortic valve mean gradient measures 7.0 mmHg. Aortic valve peak gradient measures 13.1 mmHg. Aortic valve area, by VTI measures 3.39 cm?. Pulmonic Valve: The pulmonic valve was not well visualized. Pulmonic valve regurgitation is not visualized. No evidence of pulmonic stenosis. Aorta: The aortic root, ascending aorta, aortic arch and descending aorta are all structurally normal, with no evidence of dilitation or obstruction. Venous: The inferior vena cava is dilated in size with greater than 50% respiratory variability, suggesting right atrial pressure of 8 mmHg. IAS/Shunts: The interatrial septum was not well visualized.  LEFT VENTRICLE PLAX 2D LVIDd:         4.90 cm     Diastology LVIDs:         3.00 cm  LV e' medial:    14.10 cm/s LV PW:         0.80 cm     LV E/e' medial:  11.2 LV IVS:        0.90 cm     LV e' lateral:   12.60 cm/s LVOT diam:     2.00 cm     LV E/e' lateral: 12.5 LV SV:         89 LV SV Index:   36 LVOT Area:     3.14 cm?                             3D Volume EF: LV Volumes (MOD)           3D EF:        64 % LV vol d, MOD A2C: 79.4 ml LV EDV:       187 ml LV vol d, MOD A4C: 88.3 ml LV ESV:       67 ml LV vol s, MOD A2C: 32.7 ml LV SV:        120 ml LV vol s, MOD A4C: 28.5 ml LV SV MOD A2C:     46.7 ml LV SV MOD A4C:     88.3 ml LV SV MOD BP:      54.0 ml RIGHT VENTRICLE             IVC RV Basal diam:  3.78 cm     IVC diam: 2.10 cm RV S prime:     13.20 cm/s TAPSE (M-mode): 2.4 cm LEFT ATRIUM             Index        RIGHT ATRIUM           Index LA diam:        3.60 cm 1.46 cm/m?   RA Area:     15.25 cm? LA Vol (A2C):   50.2 ml 20.42 ml/m?  RA Volume:   36.35 ml  14.79 ml/m? LA Vol (A4C):   74.2 ml 30.19 ml/m? LA Biplane Vol: 74.9 ml 30.47 ml/m?  AORTIC VALVE AV Area (Vmax):    2.79 cm? AV Area (Vmean):   2.82 cm? AV Area (VTI):     3.39 cm? AV Vmax:           181.00 cm/s AV Vmean:          127.000 cm/s AV VTI:            0.261 m AV Peak Grad:      13.1 mmHg AV Mean Grad:       7.0 mmHg LVOT Vmax:         161.00 cm/s LVOT Vmean:        114.000 cm/s LVOT VTI:          0.282 m LVOT/AV VTI ratio: 1.08  AORTA Ao Root diam: 3.10 cm Ao Asc diam:  3.20 cm MITRAL VALVE                TRICUSPID

## 2021-09-23 NOTE — Progress Notes (Signed)
Physical Therapy Treatment and Discharge ?Patient Details ?Name: Theresa Moran ?MRN: [id number] ?DOB: Jul 11, 1960 ?Today's Date: 09/23/2021 ? ? ?History of Present Illness Pt is a 61 year old woman admitted on 09/21/21 with SOB and LE edema x 3 weeks. + CAP and SIRS PMH: neuropathy, anxiety, depression, hemolytic anemia, thrombocytopenia, ankle fx, morbid obesity. Pt with recent imaging + hepatic cirrhosis, splenomegaly, thickening of stomach concerning for lymphoma or inflammatory process. ? ?  ?PT Comments  ? ? Patient mobilizing well with RW (140 ft) with HR max 124 bpm and dyspnea 3/4. Sats 94% on RA. 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 No further education or PT needed. Will discharge from PT. RN and NT made aware so they can continue to assist her with walking if has IV attached. ?   ?Recommendations for follow up therapy are one component of a multi-disciplinary discharge planning process, led by the attending physician.  Recommendations may be updated based on patient status, additional functional criteria and insurance authorization. ? ?Follow Up Recommendations ? No PT follow up ?  ?  ?Assistance Recommended at Discharge PRN  ?Patient can return home with the following A little help with bathing/dressing/bathroom;Assistance with cooking/housework ?  ?Equipment Recommendations ? None recommended by PT  ?  ?Recommendations for Other Services   ? ? ?  ?Precautions / Restrictions Precautions ?Precautions: Fall ?Precaution Comments: watch HR ?Restrictions ?Weight Bearing Restrictions: No  ?  ? ?Mobility ? Bed Mobility ?  ?  ?  ?  ?  ?  ?  ?General bed mobility comments: up in recliner ?  ? ?Transfers ?Overall transfer level: Modified independent ?Equipment used: Rolling walker (2 wheels) ?Transfers: Sit to/from Stand ?Sit to Stand: Modified independent (Device/Increase time) ?  ?  ?  ?  ?  ?General transfer comment: pulls up on RW  initially; then able to come to stand without UE support ?  ? ?Ambulation/Gait ?Ambulation/Gait assistance: Modified independent (Device/Increase time) ?Gait Distance (Feet): 140 Feet ?Assistive device: Rolling walker (2 wheels) ?Gait Pattern/deviations: Step-through pattern, Decreased stride length, Wide base of support, Antalgic ?  ?Gait velocity interpretation: 1.31 - 2.62 ft/sec, indicative of limited community ambulator ?  ?General Gait Details: sats 94% on RA; HR max 124 ? ? ?Stairs ?  ?  ?  ?  ?  ? ? ?Wheelchair Mobility ?  ? ?Modified Rankin (Stroke Patients Only) ?  ? ? ?  ?Balance Overall balance assessment: Needs assistance ?  ?Sitting balance-Leahy Scale: Good ?  ?  ?  ?Standing balance-Leahy Scale: Fair ?  ?  ?  ?  ?  ?  ?  ?  ?  ?  ?  ?  ?  ? ?  ?Cognition Arousal/Alertness: Awake/alert ?Behavior During Therapy: Theresa Moran for tasks assessed/performed ?Overall Cognitive Status: Within Functional Limits for tasks assessed ?  ?  ?  ?  ?  ?  ?  ?  ?  ?  ?  ?  ?  ?  ?  ?  ?  ?  ?  ? ?  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 ?  ?General Comments   ?  ?  ? ?  Pertinent Vitals/Pain Pain Assessment ?Pain Assessment: 0-10 ?Pain Score: 2  ?Pain Location: L ankle ?Pain Descriptors / Indicators: Aching ?Pain Intervention(s): Limited activity within patient's tolerance, Monitored during session  ? ? ?Home Living   ?  ?  ?  ?  ?  ?  ?  ?  ?  ?   ?  ?Prior Function    ?  ?  ?   ? ?PT Goals (current goals can now be found in the care plan section) Acute Rehab PT Goals ?Patient Stated Goal: to return to independent ?PT Goal Formulation: With patient ?Time For Goal Achievement: 10/06/21 ?Potential to Achieve Goals: Good ?Progress towards PT goals: Goals met/education completed, patient  discharged from PT ? ?  ?Frequency ? ? ? Min 3X/week ? ? ? ?  ?PT Plan Discharge plan needs to be updated  ? ? ?Co-evaluation   ?  ?  ?  ?  ? ?  ?AM-PAC PT "6 Clicks" Mobility   ?Outcome Measure ? Help needed turning from your back to your side while in a flat bed without using bedrails?: None ?Help needed moving from lying on your back to sitting on the side of a flat bed without using bedrails?: None ?Help needed moving to and from a bed to a chair (including a wheelchair)?: None ?Help needed standing up from a chair using your arms (e.g., wheelchair or bedside chair)?: None ?Help needed to walk in hospital room?: A Little ?Help needed climbing 3-5 steps with a railing? : A Little ?6 Click Score: 22 ? ?  ?End of Session   ?Activity Tolerance: Patient limited by fatigue ?Patient left: in chair;with call bell/phone within reach;with nursing/sitter in room (to assist with bath) ?Nurse Communication: Mobility status;Other (comment) (no longer needs PT) ?PT Visit Diagnosis: Other abnormalities of gait and mobility (R26.89);Muscle weakness (generalized) (M62.81);History of falling (Z91.81) ?  ?Patient is being discharged from PT services secondary to: ? ?Goals met and no further therapy needs identified. ? ? ?Please see latest Therapy Progress Note for current level of functioning and progress toward goals. ? ?Progress and discharge plan and discussed with patient/caregiver and they ? ?Agree ? ? ?Time: 4562-5638 ?PT Time Calculation (min) (ACUTE ONLY): 23 min ? ?Charges:  $Gait Training: 23-37 mins          ?          ? ? ?Arby Barrette, PT ?Acute Rehabilitation Services  ?Pager [phone] ?Office [phone] ? ? ? ?[name] ?09/23/2021, 4:20 PM ? ?

## 2021-09-23 NOTE — Progress Notes (Signed)
TED hose ordered and offered to pt. Wants placed before bed. ?

## 2021-09-24 ENCOUNTER — Encounter (HOSPITAL_COMMUNITY): Payer: Self-pay | Admitting: Hematology and Oncology

## 2021-09-24 DIAGNOSIS — J189 Pneumonia, unspecified organism: Secondary | ICD-10-CM | POA: Diagnosis not present

## 2021-09-24 DIAGNOSIS — D696 Thrombocytopenia, unspecified: Secondary | ICD-10-CM | POA: Diagnosis not present

## 2021-09-24 DIAGNOSIS — K7460 Unspecified cirrhosis of liver: Secondary | ICD-10-CM | POA: Diagnosis not present

## 2021-09-24 DIAGNOSIS — D599 Acquired hemolytic anemia, unspecified: Secondary | ICD-10-CM | POA: Diagnosis not present

## 2021-09-24 LAB — CBC WITH DIFFERENTIAL/PLATELET
Abs Immature Granulocytes: 0.02 10*3/uL (ref 0.00–0.07)
Basophils Absolute: 0.1 10*3/uL (ref 0.0–0.1)
Basophils Relative: 1 %
Eosinophils Absolute: 0.3 10*3/uL (ref 0.0–0.5)
Eosinophils Relative: 4 %
HCT: 26.9 % — ABNORMAL LOW (ref 36.0–46.0)
Hemoglobin: 8.4 g/dL — ABNORMAL LOW (ref 12.0–15.0)
Immature Granulocytes: 0 %
Lymphocytes Relative: 37 %
Lymphs Abs: 2.9 10*3/uL (ref 0.7–4.0)
MCH: 29.4 pg (ref 26.0–34.0)
MCHC: 31.2 g/dL (ref 30.0–36.0)
MCV: 94.1 fL (ref 80.0–100.0)
Monocytes Absolute: 0.8 10*3/uL (ref 0.1–1.0)
Monocytes Relative: 10 %
Neutro Abs: 3.7 10*3/uL (ref 1.7–7.7)
Neutrophils Relative %: 48 %
Platelets: 170 10*3/uL (ref 150–400)
RBC: 2.86 MIL/uL — ABNORMAL LOW (ref 3.87–5.11)
RDW: 17.2 % — ABNORMAL HIGH (ref 11.5–15.5)
WBC: 7.8 10*3/uL (ref 4.0–10.5)
nRBC: 0 % (ref 0.0–0.2)

## 2021-09-24 LAB — MAGNESIUM: Magnesium: 2.1 mg/dL (ref 1.7–2.4)

## 2021-09-24 LAB — COMPREHENSIVE METABOLIC PANEL
ALT: 26 U/L (ref 0–44)
AST: 52 U/L — ABNORMAL HIGH (ref 15–41)
Albumin: 2.6 g/dL — ABNORMAL LOW (ref 3.5–5.0)
Alkaline Phosphatase: 102 U/L (ref 38–126)
Anion gap: 8 (ref 5–15)
BUN: 12 mg/dL (ref 8–23)
CO2: 28 mmol/L (ref 22–32)
Calcium: 8.8 mg/dL — ABNORMAL LOW (ref 8.9–10.3)
Chloride: 104 mmol/L (ref 98–111)
Creatinine, Ser: 1.04 mg/dL — ABNORMAL HIGH (ref 0.44–1.00)
GFR, Estimated: >60 mL/min (ref >60)
Glucose, Bld: 91 mg/dL (ref 70–99)
Potassium: 3.7 mmol/L (ref 3.5–5.1)
Sodium: 140 mmol/L (ref 135–145)
Total Bilirubin: 1 mg/dL (ref 0.3–1.2)
Total Protein: 6.4 g/dL — ABNORMAL LOW (ref 6.5–8.1)

## 2021-09-24 LAB — PROCALCITONIN: Procalcitonin: <0.1 ng/mL

## 2021-09-24 LAB — BRAIN NATRIURETIC PEPTIDE: B Natriuretic Peptide: 9.1 pg/mL (ref 0.0–100.0)

## 2021-09-24 MED ORDER — ALBUMIN HUMAN 25 % IV SOLN
25.0000 g | Freq: Four times a day (QID) | INTRAVENOUS | Status: AC
  Administered 2021-09-24 – 2021-09-26 (×7): 25 g via INTRAVENOUS
  Filled 2021-09-24 (×7): qty 100

## 2021-09-24 NOTE — Care Management Important Message (Signed)
Important Message ? ?Patient Details  ?Name: Theresa Moran ?MRN: [id number] ?Date of Birth: 04-25-1961 ? ? ?Medicare Important Message Given:  Yes ? ? ? ? ?[name] ?09/24/2021, 3:55 PM ?

## 2021-09-24 NOTE — Progress Notes (Signed)
? ? ? ? Progress Note ? ? Subjective  ?Patient feeling better. Edema improving with diuretics. She is breathing okay, oxygen sats look good. She think she is ready for EGD / colonscopy ? ? Objective  ? ?Vital signs in last 24 hours: ?Temp:  [97.8 ?F (36.6 ?C)-98.2 ?F (36.8 ?C)] 97.9 ?F (36.6 ?C) (05/03 1148) ?Pulse Rate:  [100-109] 100 (05/03 1148) ?Resp:  [10-20] 20 (05/03 1148) ?BP: (80-108)/(47-72) 88/47 (05/03 1148) ?SpO2:  [90 %-96 %] 90 % (05/03 0600) ?Weight:  [139.7 kg] 139.7 kg (05/03 0500) ?Last BM Date : 09/23/21 ?General:    white female in NAD ?Extremities:  (+) edema in LE but improved from previous. ?Neurologic:  Alert and oriented,  grossly normal neurologically. ?Psych:  Cooperative. Normal mood and affect. ? ?Intake/Output from previous day: ?05/02 0701 - 05/03 0700 ?In: 516.4 [IV Piggyback:516.4] ?Out: -  ?Intake/Output this shift: ?No intake/output data recorded. ? ?Lab Results: ?Recent Labs  ?  09/22/21 ?0206 09/23/21 ?0154 09/24/21 ?8676  ?WBC 7.6 8.0 7.8  ?HGB 8.1* 7.9* 8.4*  ?HCT 25.7* 25.1* 26.9*  ?PLT 134* 148* 170  ? ?BMET ?Recent Labs  ?  09/22/21 ?0206 09/23/21 ?0154 09/24/21 ?7209  ?NA 137 138 140  ?K 3.9 3.5 3.7  ?CL 107 106 104  ?CO2 '25 25 28  '$ ?GLUCOSE 128* 84 91  ?BUN '9 10 12  '$ ?CREATININE 0.85 0.94 1.04*  ?CALCIUM 8.2* 8.4* 8.8*  ? ?LFT ?Recent Labs  ?  09/24/21 ?4709  ?PROT 6.4*  ?ALBUMIN 2.6*  ?AST 52*  ?ALT 26  ?ALKPHOS 102  ?BILITOT 1.0  ? ?PT/INR ?Recent Labs  ?  09/22/21 ?0959  ?LABPROT 18.2*  ?INR 1.5*  ? ? ?Studies/Results: ?ECHOCARDIOGRAM COMPLETE ? ?Result Date: 09/22/2021 ?   ECHOCARDIOGRAM REPORT   Patient Name:   CAROLIE MCILRATH Date of Exam: 09/22/2021 Medical Rec #:  [id number]    Height:       66.0 in Accession #:    [id number]   Weight:       325.0 lb Date of Birth:  01/14/1961     BSA:          2.458 m? Patient Age:    81 years     BP:           128/59 mmHg Patient Gender: F            HR:           114 bpm. Exam Location:  Inpatient Procedure: 2D Echo, 3D Echo, Cardiac  Doppler and Color Doppler Indications:    Hemolytic amemia.; R06.9 DOE  History:        Patient has no prior history of Echocardiogram examinations.                 Abnormal ECG; Arrythmias:Tachycardia.  Sonographer:    Roseanna Rainbow RDCS Referring Phys: [id number] RONDELL A SMITH  Sonographer Comments: Technically difficult study due to poor echo windows and patient is morbidly obese. Image acquisition challenging due to patient body habitus. IMPRESSIONS  1. Left ventricular ejection fraction, by estimation, is 60 to 65%. The left ventricle has normal function. The left ventricle has no regional wall motion abnormalities. Left ventricular diastolic parameters were normal.  2. Right ventricular systolic function is normal. The right ventricular size is normal. There is moderately elevated pulmonary artery systolic pressure.  3. Left atrial size was mildly dilated.  4. The mitral valve is grossly normal. Mild mitral valve regurgitation. No evidence of  mitral stenosis.  5. The aortic valve is grossly normal. There is mild calcification of the aortic valve. There is mild thickening of the aortic valve. Aortic valve regurgitation is not visualized. Aortic valve sclerosis/calcification is present, without any evidence of aortic stenosis.  6. The inferior vena cava is dilated in size with >50% respiratory variability, suggesting right atrial pressure of 8 mmHg. Comparison(s): No prior Echocardiogram. Conclusion(s)/Recommendation(s): Otherwise normal echocardiogram, with minor abnormalities described in the report. FINDINGS  Left Ventricle: Left ventricular ejection fraction, by estimation, is 60 to 65%. The left ventricle has normal function. The left ventricle has no regional wall motion abnormalities. The left ventricular internal cavity size was normal in size. There is  no left ventricular hypertrophy. Left ventricular diastolic parameters were normal. Right Ventricle: The right ventricular size is normal. Right vetricular  wall thickness was not well visualized. Right ventricular systolic function is normal. There is moderately elevated pulmonary artery systolic pressure. The tricuspid regurgitant velocity is 3.08 m/s, and with an assumed right atrial pressure of 8 mmHg, the estimated right ventricular systolic pressure is 51.8 mmHg. Left Atrium: Left atrial size was mildly dilated. Right Atrium: Right atrial size was normal in size. Pericardium: There is no evidence of pericardial effusion. Mitral Valve: The mitral valve is grossly normal. Mild mitral valve regurgitation. No evidence of mitral valve stenosis. MV peak gradient, 18.1 mmHg. The mean mitral valve gradient is 8.0 mmHg. Tricuspid Valve: The tricuspid valve is normal in structure. Tricuspid valve regurgitation is trivial. No evidence of tricuspid stenosis. Aortic Valve: The aortic valve is grossly normal. There is mild calcification of the aortic valve. There is mild thickening of the aortic valve. Aortic valve regurgitation is not visualized. Aortic valve sclerosis/calcification is present, without any evidence of aortic stenosis. Aortic valve mean gradient measures 7.0 mmHg. Aortic valve peak gradient measures 13.1 mmHg. Aortic valve area, by VTI measures 3.39 cm?. Pulmonic Valve: The pulmonic valve was not well visualized. Pulmonic valve regurgitation is not visualized. No evidence of pulmonic stenosis. Aorta: The aortic root, ascending aorta, aortic arch and descending aorta are all structurally normal, with no evidence of dilitation or obstruction. Venous: The inferior vena cava is dilated in size with greater than 50% respiratory variability, suggesting right atrial pressure of 8 mmHg. IAS/Shunts: The interatrial septum was not well visualized.  LEFT VENTRICLE PLAX 2D LVIDd:         4.90 cm     Diastology LVIDs:         3.00 cm     LV e' medial:    14.10 cm/s LV PW:         0.80 cm     LV E/e' medial:  11.2 LV IVS:        0.90 cm     LV e' lateral:   12.60 cm/s LVOT  diam:     2.00 cm     LV E/e' lateral: 12.5 LV SV:         89 LV SV Index:   36 LVOT Area:     3.14 cm?                             3D Volume EF: LV Volumes (MOD)           3D EF:        64 % LV vol d, MOD A2C: 79.4 ml LV EDV:       187 ml LV  vol d, MOD A4C: 88.3 ml LV ESV:       67 ml LV vol s, MOD A2C: 32.7 ml LV SV:        120 ml LV vol s, MOD A4C: 28.5 ml LV SV MOD A2C:     46.7 ml LV SV MOD A4C:     88.3 ml LV SV MOD BP:      54.0 ml RIGHT VENTRICLE             IVC RV Basal diam:  3.78 cm     IVC diam: 2.10 cm RV S prime:     13.20 cm/s TAPSE (M-mode): 2.4 cm LEFT ATRIUM             Index        RIGHT ATRIUM           Index LA diam:        3.60 cm 1.46 cm/m?   RA Area:     15.25 cm? LA Vol (A2C):   50.2 ml 20.42 ml/m?  RA Volume:   36.35 ml  14.79 ml/m? LA Vol (A4C):   74.2 ml 30.19 ml/m? LA Biplane Vol: 74.9 ml 30.47 ml/m?  AORTIC VALVE AV Area (Vmax):    2.79 cm? AV Area (Vmean):   2.82 cm? AV Area (VTI):     3.39 cm? AV Vmax:           181.00 cm/s AV Vmean:          127.000 cm/s AV VTI:            0.261 m AV Peak Grad:      13.1 mmHg AV Mean Grad:      7.0 mmHg LVOT Vmax:         161.00 cm/s LVOT Vmean:        114.000 cm/s LVOT VTI:          0.282 m LVOT/AV VTI ratio: 1.08  AORTA Ao Root diam: 3.10 cm Ao Asc diam:  3.20 cm MITRAL VALVE                TRICUSPID VALVE MV Area (PHT): 5.97 cm?     TR Peak grad:   37.9 mmHg MV Area VTI:   2.19 cm?     TR Vmax:        308.00 cm/s MV Peak grad:  18.1 mmHg MV Mean grad:  8.0 mmHg     SHUNTS MV Vmax:       2.13 m/s     Systemic VTI:  0.28 m MV Vmean:      132.0 cm/s   Systemic Diam: 2.00 cm MV Decel Time: 127 msec MV E velocity: 158.00 cm/s MV A velocity: 190.00 cm/s MV E/A ratio:  0.83 Buford Dresser MD Electronically signed by Buford Dresser MD Signature Date/Time: 09/22/2021/4:56:35 PM    Final    ? ? ? ? Assessment / Plan:   ?61 y/o female with history of IDA, newly diagnosed cirrhosis, admitted with progressive LE and UE edema. Recently had cough -  found to have multifocal pneumonia. She has had issues with dysphagia and thickening of her stomach on CT scan. Recent US shows stable cirrhosis of the liver but somewhat limited by body habitus. Cirrhosis p

## 2021-09-24 NOTE — Progress Notes (Signed)
?                                  PROGRESS NOTE                                             ?                                                                                                                     ?                                         ? ? Patient Demographics:  ? ? Theresa Moran, is a 61 y.o. female, DOB - 04-17-1961, ZDG:[id number] ? ?Outpatient Primary MD for the patient is Tawnya Crook, MD    LOS - 3  Admit date - 09/21/2021   ? ?Chief Complaint  ?Patient presents with  ? Shortness of Breath  ?  BIBA from home, past few weeks Hgb and Hct have been low. Had X2 blood transfusions at [facility], pt states scheduled for blood transfusion on Monday. Productive cough X 3 days  ?    ? ?Brief Narrative (HPI from H&P)  61 y.o. female with medical history significant of neuropathy, anxiety/depression, hemolytic anemia, and thrombocytopenia who presents with complaints of shortness of breath.  She notes that everything is happened over the last 3 weeks. Patient has been being worked up in outpatient setting by oncology in regards to thrombocytopenia and anemia  Underwent bone marrow biopsy on 4/24 that noted hypercellular marrow with erythroid hyperplasia.  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  Patient had been referred to GI and is scheduled for EGD on 5/16. she has flow cytometry and other studies still pending.  6 days ago, she was transfused 2 units of packed red blood cells due to hemoglobin dropping down to 6.8 g/dL. ? ?Presented to the hospital with shortness of breath she was diagnosed with community-acquired pneumonia along with fluid overload and admitted to the hospital. ? ? ? Subjective:  ? ?Patient in bed, she denies any complaints today, reports dyspnea and cough has significantly improved, no nausea,  no vomiting. ? ? Assessment  & Plan :  ? ?CAP - with productive cough and right-sided infiltrate, monitor cultures has been placed on appropriate empiric antibiotics which will be continued, since infiltrate is predominantly on the right side we have involved speech as well.  Continue to monitor, improving clinically. ? ?Morbid obesity with recent suspicion of hepatic cirrhosis on outpatient imaging with suspicion of NASH.  Question if  she has underlying NASH ?-Input greatly appreciated, plan for endoscopy/colonoscopy tomorrow. ?- right upper quadrant ultrasound confirms cirrhosis, ?-Acute hepatitis panel unremarkable. ?-Blood pressure remains soft, continue with Midodrin, did add albumin as well. ?-On Lasix and Aldactone. ? ?Recent diagnosis of hemolytic anemia, chronic thrombocytopenia.  Iron deficiency anemia ?-  under the care of oncologist Dr.Iruku, outpatient work-up being done she has undergone bone marrow biopsy with final results pending, oncology team is following. ?-plan for endoscopy , colonoscopy for evaluation of iron deficiency anemia ? ?Anxiety and depression.  Continue home regimen which includes BuSpar and nortriptyline. ? ?Fluid overload.  Stable echocardiogram, continue diuresis with Lasix and Aldactone monitor electrolytes closely. ? ?Persistent tachycardia, CTA stable, echocardiogram along with TSH nonacute and stable,  question if her hemolytic anemia is causing a high output state with underlying tachycardia. ? ?Obesity with BMI of 49 ? ?   ? ?Condition - Fair ? ?Family Communication  :  None present ? ?Code Status :  Full ? ?Consults  : GI, oncology ? ?PUD Prophylaxis : PPI ? ? Procedures  :    ? ?TTE - 1. Left ventricular ejection fraction, by estimation, is 60 to 65%. The left ventricle has normal function. The left ventricle has no regional wall motion abnormalities. Left ventricular diastolic parameters were normal.  2. Right ventricular systolic function is normal. The right ventricular  size is normal. There is moderately elevated pulmonary artery systolic pressure.  3. Left atrial size was mildly dilated.  4. The mitral valve is grossly normal. Mild mitral valve regurgitation. No evidence of mitral stenosis.  5. The aortic valve is grossly normal. There is mild calcification of the aortic valve. There is mild thickening of the aortic valve. Aortic valve regurgitation is not visualized. Aortic valve sclerosis/calcification is present, without any evidence of aortic stenosis.  6. The inferior vena cava is dilated in size with >50% respiratory variability, suggesting right atrial pressure of 8 mmHg. ? ?RUQ [place] - Status post cholecystectomy. Findings consistent with hepatic cirrhosis. No definite focal sonographic hepatic abnormality is noted, although imaging is somewhat limited due to body habitus. ? ?CTA -  1. Vascular contrast bolus had to be repeated. No pulmonary embolus identified. 2. Lower lung volumes from 10 days ago with small new layering pleural effusions and new with multifocal bilateral lower lobe and right middle lobe opacity most resembling Multifocal Pneumonia. 3. Cirrhosis.  Calcified coronary artery atherosclerosis ? ?   ? ?Disposition Plan  :   ? ?Status is: Inpatient ? ?DVT Prophylaxis  :   ? ?heparin injection 5,000 Units Start: 09/22/21 1400 ?Place TED hose Start: 09/22/21 0555 ?SCDs Start: 09/21/21 0955 ?  ? ?Lab Results  ?Component Value Date  ? PLT 170 09/24/2021  ? ? ?Diet :  ?Diet Order   ? ?       ?  Diet NPO time specified Except for: Sips with Meds  Diet effective midnight       ?  ?  Diet clear liquid Room service appropriate? Yes; Fluid consistency: Thin  Diet effective 0500 tomorrow       ?  ? ?  ?  ? ?  ?  ? ?Inpatient Medications ? ?Scheduled Meds: ? baclofen  10 mg Oral BID  ? bisacodyl  20 mg Oral Once  ? busPIRone  10 mg Oral BID  ? furosemide  80 mg Oral Daily  ? gabapentin  600 mg Oral BID  ? guaiFENesin  600 mg Oral BID  ?  heparin injection (subcutaneous)   5,000 Units Subcutaneous Q8H  ? metoCLOPramide (REGLAN) injection  10 mg Intravenous Q6H  ? midodrine  5 mg Oral TID WC  ? nortriptyline  100 mg Oral QHS  ? pantoprazole  40 mg Oral Daily  ? peg 3350 powder  0.5 kit Oral Once  ? And  ? peg 3350 powder  0.5 kit Oral Once  ? potassium chloride  40 mEq Oral Daily  ? sodium chloride flush  3 mL Intravenous Q12H  ? spironolactone  100 mg Oral Daily  ? tapentadol  50 mg Oral Daily  ? ?Continuous Infusions: ? albumin human 25 g (09/24/21 1155)  ? azithromycin Stopped (09/23/21 1421)  ? cefTRIAXone (ROCEPHIN)  IV Stopped (09/23/21 1034)  ? ?PRN Meds:.acetaminophen **OR** acetaminophen, albuterol ? ?Time Spent in minutes  30 ? ? ?Phillips Climes M.D on 09/24/2021 at 2:15 PM ? ?To page go to [email or web address]  ? ?[facility] -  Office  [phone] ? ?See all Orders from today for further details ? ? ? Objective:  ? ?Vitals:  ? 09/24/21 0500 09/24/21 0600 09/24/21 0744 09/24/21 1148  ?BP:  108/66 (!) 106/57 (!) 88/47  ?Pulse:  (!) 106  100  ?Resp: '20 14  20  ' ?Temp:   97.9 ?F (36.6 ?C) 97.9 ?F (36.6 ?C)  ?TempSrc:   Oral Oral  ?SpO2:  90%    ?Weight: (!) 139.7 kg     ?Height:      ? ? ?Wt Readings from Last 3 Encounters:  ?09/24/21 (!) 139.7 kg  ?09/19/21 (!) 147 kg  ?09/18/21 (!) 147 kg  ? ? ? ?Intake/Output Summary (Last 24 hours) at 09/24/2021 1415 ?Last data filed at 09/24/2021 [id number] ?Gross per 24 hour  ?Intake 516.4 ml  ?Output --  ?Net 516.4 ml  ? ? ? ? ?Physical Exam ? ?Awake Alert, Oriented X 3, No new F.N deficits, Normal affect ?Symmetrical Chest wall movement, Good air movement bilaterally, CTAB ?RRR,No Gallops,Rubs or new Murmurs, No Parasternal Heave ?+ve B.Sounds, Abd Soft, No tenderness, No rebound - guarding or rigidity. ?No Cyanosis, Clubbing ,+1 edema, No new Rash or bruise   ? ? ? Data Review:  ? ? ?CBC ?Recent Labs  ?Lab 09/18/21 ?1042 09/21/21 ?0200 09/22/21 ?0206 09/23/21 ?0154 09/24/21 ?7225  ?WBC 6.8 10.4 7.6 8.0 7.8  ?HGB 8.6* 8.5* 8.1* 7.9* 8.4*  ?HCT  26.8* 26.9* 25.7* 25.1* 26.9*  ?PLT 108* 130* 134* 148* 170  ?MCV 93.1 95.4 93.8 93.7 94.1  ?MCH 29.9 30.1 29.6 29.5 29.4  ?MCHC 32.1 31.6 31.5 31.5 31.2  ?RDW 18.7* 17.9* 17.4* 17.3* 17.2*  ?LYMPHSABS 1

## 2021-09-24 NOTE — H&P (View-Only) (Signed)
? ? ? ? Progress Note ? ? Subjective  ?Patient feeling better. Edema improving with diuretics. She is breathing okay, oxygen sats look good. She think she is ready for EGD / colonscopy ? ? Objective  ? ?Vital signs in last 24 hours: ?Temp:  [97.8 ?F (36.6 ?C)-98.2 ?F (36.8 ?C)] 97.9 ?F (36.6 ?C) (05/03 1148) ?Pulse Rate:  [100-109] 100 (05/03 1148) ?Resp:  [10-20] 20 (05/03 1148) ?BP: (80-108)/(47-72) 88/47 (05/03 1148) ?SpO2:  [90 %-96 %] 90 % (05/03 0600) ?Weight:  [139.7 kg] 139.7 kg (05/03 0500) ?Last BM Date : 09/23/21 ?General:    white female in NAD ?Extremities:  (+) edema in LE but improved from previous. ?Neurologic:  Alert and oriented,  grossly normal neurologically. ?Psych:  Cooperative. Normal mood and affect. ? ?Intake/Output from previous day: ?05/02 0701 - 05/03 0700 ?In: 516.4 [IV Piggyback:516.4] ?Out: -  ?Intake/Output this shift: ?No intake/output data recorded. ? ?Lab Results: ?Recent Labs  ?  09/22/21 ?0206 09/23/21 ?0154 09/24/21 ?6333  ?WBC 7.6 8.0 7.8  ?HGB 8.1* 7.9* 8.4*  ?HCT 25.7* 25.1* 26.9*  ?PLT 134* 148* 170  ? ?BMET ?Recent Labs  ?  09/22/21 ?0206 09/23/21 ?0154 09/24/21 ?5456  ?NA 137 138 140  ?K 3.9 3.5 3.7  ?CL 107 106 104  ?CO2 '25 25 28  '$ ?GLUCOSE 128* 84 91  ?BUN '9 10 12  '$ ?CREATININE 0.85 0.94 1.04*  ?CALCIUM 8.2* 8.4* 8.8*  ? ?LFT ?Recent Labs  ?  09/24/21 ?2563  ?PROT 6.4*  ?ALBUMIN 2.6*  ?AST 52*  ?ALT 26  ?ALKPHOS 102  ?BILITOT 1.0  ? ?PT/INR ?Recent Labs  ?  09/22/21 ?0959  ?LABPROT 18.2*  ?INR 1.5*  ? ? ?Studies/Results: ?ECHOCARDIOGRAM COMPLETE ? ?Result Date: 09/22/2021 ?   ECHOCARDIOGRAM REPORT   Patient Name:   Theresa Moran Date of Exam: 09/22/2021 Medical Rec #:  [id number]    Height:       66.0 in Accession #:    [id number]   Weight:       325.0 lb Date of Birth:  11/19/1960     BSA:          2.458 m? Patient Age:    61 years     BP:           128/59 mmHg Patient Gender: F            HR:           114 bpm. Exam Location:  Inpatient Procedure: 2D Echo, 3D Echo, Cardiac  Doppler and Color Doppler Indications:    Hemolytic amemia.; R06.9 DOE  History:        Patient has no prior history of Echocardiogram examinations.                 Abnormal ECG; Arrythmias:Tachycardia.  Sonographer:    Roseanna Rainbow RDCS Referring Phys: [id number] RONDELL A SMITH  Sonographer Comments: Technically difficult study due to poor echo windows and patient is morbidly obese. Image acquisition challenging due to patient body habitus. IMPRESSIONS  1. Left ventricular ejection fraction, by estimation, is 60 to 65%. The left ventricle has normal function. The left ventricle has no regional wall motion abnormalities. Left ventricular diastolic parameters were normal.  2. Right ventricular systolic function is normal. The right ventricular size is normal. There is moderately elevated pulmonary artery systolic pressure.  3. Left atrial size was mildly dilated.  4. The mitral valve is grossly normal. Mild mitral valve regurgitation. No evidence of  mitral stenosis.  5. The aortic valve is grossly normal. There is mild calcification of the aortic valve. There is mild thickening of the aortic valve. Aortic valve regurgitation is not visualized. Aortic valve sclerosis/calcification is present, without any evidence of aortic stenosis.  6. The inferior vena cava is dilated in size with >50% respiratory variability, suggesting right atrial pressure of 8 mmHg. Comparison(s): No prior Echocardiogram. Conclusion(s)/Recommendation(s): Otherwise normal echocardiogram, with minor abnormalities described in the report. FINDINGS  Left Ventricle: Left ventricular ejection fraction, by estimation, is 60 to 65%. The left ventricle has normal function. The left ventricle has no regional wall motion abnormalities. The left ventricular internal cavity size was normal in size. There is  no left ventricular hypertrophy. Left ventricular diastolic parameters were normal. Right Ventricle: The right ventricular size is normal. Right vetricular  wall thickness was not well visualized. Right ventricular systolic function is normal. There is moderately elevated pulmonary artery systolic pressure. The tricuspid regurgitant velocity is 3.08 m/s, and with an assumed right atrial pressure of 8 mmHg, the estimated right ventricular systolic pressure is 09.8 mmHg. Left Atrium: Left atrial size was mildly dilated. Right Atrium: Right atrial size was normal in size. Pericardium: There is no evidence of pericardial effusion. Mitral Valve: The mitral valve is grossly normal. Mild mitral valve regurgitation. No evidence of mitral valve stenosis. MV peak gradient, 18.1 mmHg. The mean mitral valve gradient is 8.0 mmHg. Tricuspid Valve: The tricuspid valve is normal in structure. Tricuspid valve regurgitation is trivial. No evidence of tricuspid stenosis. Aortic Valve: The aortic valve is grossly normal. There is mild calcification of the aortic valve. There is mild thickening of the aortic valve. Aortic valve regurgitation is not visualized. Aortic valve sclerosis/calcification is present, without any evidence of aortic stenosis. Aortic valve mean gradient measures 7.0 mmHg. Aortic valve peak gradient measures 13.1 mmHg. Aortic valve area, by VTI measures 3.39 cm?. Pulmonic Valve: The pulmonic valve was not well visualized. Pulmonic valve regurgitation is not visualized. No evidence of pulmonic stenosis. Aorta: The aortic root, ascending aorta, aortic arch and descending aorta are all structurally normal, with no evidence of dilitation or obstruction. Venous: The inferior vena cava is dilated in size with greater than 50% respiratory variability, suggesting right atrial pressure of 8 mmHg. IAS/Shunts: The interatrial septum was not well visualized.  LEFT VENTRICLE PLAX 2D LVIDd:         4.90 cm     Diastology LVIDs:         3.00 cm     LV e' medial:    14.10 cm/s LV PW:         0.80 cm     LV E/e' medial:  11.2 LV IVS:        0.90 cm     LV e' lateral:   12.60 cm/s LVOT  diam:     2.00 cm     LV E/e' lateral: 12.5 LV SV:         89 LV SV Index:   36 LVOT Area:     3.14 cm?                             3D Volume EF: LV Volumes (MOD)           3D EF:        64 % LV vol d, MOD A2C: 79.4 ml LV EDV:       187 ml LV  vol d, MOD A4C: 88.3 ml LV ESV:       67 ml LV vol s, MOD A2C: 32.7 ml LV SV:        120 ml LV vol s, MOD A4C: 28.5 ml LV SV MOD A2C:     46.7 ml LV SV MOD A4C:     88.3 ml LV SV MOD BP:      54.0 ml RIGHT VENTRICLE             IVC RV Basal diam:  3.78 cm     IVC diam: 2.10 cm RV S prime:     13.20 cm/s TAPSE (M-mode): 2.4 cm LEFT ATRIUM             Index        RIGHT ATRIUM           Index LA diam:        3.60 cm 1.46 cm/m?   RA Area:     15.25 cm? LA Vol (A2C):   50.2 ml 20.42 ml/m?  RA Volume:   36.35 ml  14.79 ml/m? LA Vol (A4C):   74.2 ml 30.19 ml/m? LA Biplane Vol: 74.9 ml 30.47 ml/m?  AORTIC VALVE AV Area (Vmax):    2.79 cm? AV Area (Vmean):   2.82 cm? AV Area (VTI):     3.39 cm? AV Vmax:           181.00 cm/s AV Vmean:          127.000 cm/s AV VTI:            0.261 m AV Peak Grad:      13.1 mmHg AV Mean Grad:      7.0 mmHg LVOT Vmax:         161.00 cm/s LVOT Vmean:        114.000 cm/s LVOT VTI:          0.282 m LVOT/AV VTI ratio: 1.08  AORTA Ao Root diam: 3.10 cm Ao Asc diam:  3.20 cm MITRAL VALVE                TRICUSPID VALVE MV Area (PHT): 5.97 cm?     TR Peak grad:   37.9 mmHg MV Area VTI:   2.19 cm?     TR Vmax:        308.00 cm/s MV Peak grad:  18.1 mmHg MV Mean grad:  8.0 mmHg     SHUNTS MV Vmax:       2.13 m/s     Systemic VTI:  0.28 m MV Vmean:      132.0 cm/s   Systemic Diam: 2.00 cm MV Decel Time: 127 msec MV E velocity: 158.00 cm/s MV A velocity: 190.00 cm/s MV E/A ratio:  0.83 Buford Dresser MD Electronically signed by Buford Dresser MD Signature Date/Time: 09/22/2021/4:56:35 PM    Final    ? ? ? ? Assessment / Plan:   ?61 y/o female with history of IDA, newly diagnosed cirrhosis, admitted with progressive LE and UE edema. Recently had cough -  found to have multifocal pneumonia. She has had issues with dysphagia and thickening of her stomach on CT scan. Recent US shows stable cirrhosis of the liver but somewhat limited by body habitus. Cirrhosis p

## 2021-09-24 NOTE — Progress Notes (Signed)
[place] ?CONSULT NOTE ? ?Patient Care Team: ?Tawnya Crook, MD as PCP - General (Family Medicine) ? ?CHIEF COMPLAINTS/PURPOSE OF CONSULTATION:  ?Thrombocytopenia. ? ?ASSESSMENT & PLAN:  ? ?This is a pleasant 61 year old female patient with past medical history significant for neuropathy anxiety/depression referred to hematology for evaluation of progressive thrombocytopenia.   ? ?On evaluation of labs, she was found to have progressive anemia, nonimmune mediated hemolysis with reticulocyte count around 5%.  No elevated LDH or total bilirubin.  DAT test is negative, there appears to be some complement consumption.  Given no clear etiology for the nonimmune mediated hemolytic anemia, recommended imaging which showed hepatic cirrhosis and splenomegaly, diffuse thickening of the stomach concerning for infiltrative diseases such as lymphoma or inflammation.  Endoscopy was recommended.   ?She is admitted and treated for CAP.  ? ?She is recovering well. She is scheduled for endoscopy and colonoscopy tomorrow. ?Hb greater than 8, no indication for transfusion. ?We will follow up after EGD/colonoscopy. ?We will arrange for follow up outpatient. ? ?Subjective: Patient is feeling much better today. She has noticed significant improvement in BLE. ?She is scheduled for endoscopy and colonoscopy tomorrow. ?She tolerated iron well. ? ?MEDICAL HISTORY:  ?Past Medical History:  ?Diagnosis Date  ? Allergy   ? Anxiety   ? Asthma   ? in the past  ? Chronic pain   ? Depression   ? Gallstones   ? GERD (gastroesophageal reflux disease)   ? Headache   ? stopped after menopause  ? Neuromuscular disorder ([place])   ? Urticaria   ? ? ?SURGICAL HISTORY: ?Past Surgical History:  ?Procedure Laterality Date  ? APPENDECTOMY  1979  ? BREAST REDUCTION SURGERY    ? BREAST SURGERY  1980  ? breast reduction  ? CHOLECYSTECTOMY    ? NASAL SEPTUM SURGERY    ? ORIF ANKLE FRACTURE Left 03/26/2021  ? Procedure: OPEN REDUCTION INTERNAL  FIXATION (ORIF) ANKLE FRACTURE;  Surgeon: Armond Hang, MD;  Location: [place];  Service: Orthopedics;  Laterality: Left;  ? ? ?SOCIAL HISTORY: ?Social History  ? ?Socioeconomic History  ? Marital status: Married  ?  Spouse name: Not on file  ? Number of children: Not on file  ? Years of education: Not on file  ? Highest education level: Not on file  ?Occupational History  ? Not on file  ?Tobacco Use  ? Smoking status: Former  ?  Packs/day: 1.00  ?  Years: 15.00  ?  Pack years: 15.00  ?  Types: Cigarettes  ?  Quit date: 01/2004  ?  Years since quitting: 17.6  ? Smokeless tobacco: Never  ?Vaping Use  ? Vaping Use: Never used  ?Substance and Sexual Activity  ? Alcohol use: Not Currently  ? Drug use: No  ? Sexual activity: Not Currently  ?Other Topics Concern  ? Not on file  ?Social History Narrative  ? Has 1 child(artificial insem), 2 adopted, and 1 step from partner's former partner  ? Raising 4 grandchildren.  ? ?Social Determinants of Health  ? ?Financial Resource Strain: Not on file  ?Food Insecurity: Not on file  ?Transportation Needs: Not on file  ?Physical Activity: Not on file  ?Stress: Not on file  ?Social Connections: Not on file  ?Intimate Partner Violence: Not on file  ? ? ?FAMILY HISTORY: ?Family History  ?Problem Relation Age of Onset  ? Intellectual disability Mother   ? Hypertension Mother   ? Hyperlipidemia Mother   ? Depression Mother   ?  Arthritis Mother   ? Mental illness Mother   ? Heart disease Father   ? Diabetes Father   ? Stroke Father   ? Heart disease Brother   ? COPD Brother   ? Mental illness Daughter   ? Learning disabilities Daughter   ? Diabetes Daughter   ? Allergic rhinitis Neg Hx   ? Angioedema Neg Hx   ? Asthma Neg Hx   ? Eczema Neg Hx   ? Immunodeficiency Neg Hx   ? Urticaria Neg Hx   ? Colon cancer Neg Hx   ? Pancreatic cancer Neg Hx   ? Liver cancer Neg Hx   ? Stomach cancer Neg Hx   ? ? ?ALLERGIES:  is allergic to shellfish allergy and shellfish-derived  products. ? ?MEDICATIONS:  ?Current Facility-Administered Medications  ?Medication Dose Route Frequency Provider Last Rate Last Admin  ? acetaminophen (TYLENOL) tablet 650 mg  650 mg Oral Q6H PRN Norval Morton, MD      ? Or  ? acetaminophen (TYLENOL) suppository 650 mg  650 mg Rectal Q6H PRN Fuller Plan A, MD      ? albumin human 25 % solution 25 g  25 g Intravenous Q6H Elgergawy, Silver Huguenin, MD 60 mL/hr at 09/24/21 2107 25 g at 09/24/21 2107  ? albuterol (PROVENTIL) (2.5 MG/3ML) 0.083% nebulizer solution 2.5 mg  2.5 mg Nebulization Q2H PRN Fuller Plan A, MD      ? azithromycin (ZITHROMAX) 500 mg in sodium chloride 0.9 % 250 mL IVPB  500 mg Intravenous Q24H Smith, Rondell A, MD 250 mL/hr at 09/24/21 2123 500 mg at 09/24/21 2123  ? baclofen (LIORESAL) tablet 10 mg  10 mg Oral BID Fuller Plan A, MD   10 mg at 09/24/21 2115  ? bisacodyl (DULCOLAX) EC tablet 20 mg  20 mg Oral Once Vena Rua, PA-C      ? busPIRone (BUSPAR) tablet 10 mg  10 mg Oral BID Fuller Plan A, MD   10 mg at 09/24/21 2115  ? cefTRIAXone (ROCEPHIN) 2 g in sodium chloride 0.9 % 100 mL IVPB  2 g Intravenous Q24H Smith, Rondell A, MD 200 mL/hr at 09/24/21 1749 2 g at 09/24/21 1749  ? furosemide (LASIX) tablet 80 mg  80 mg Oral Daily Thurnell Lose, MD   80 mg at 09/24/21 1000  ? gabapentin (NEURONTIN) tablet 600 mg  600 mg Oral BID Fuller Plan A, MD   600 mg at 09/24/21 2115  ? guaiFENesin (MUCINEX) 12 hr tablet 600 mg  600 mg Oral BID Fuller Plan A, MD   600 mg at 09/24/21 2115  ? heparin injection 5,000 Units  5,000 Units Subcutaneous Q8H Thurnell Lose, MD   5,000 Units at 09/24/21 2115  ? metoCLOPramide (REGLAN) injection 10 mg  10 mg Intravenous Q6H Vena Rua, PA-C      ? midodrine (PROAMATINE) tablet 5 mg  5 mg Oral TID WC Thurnell Lose, MD   5 mg at 09/24/21 1729  ? nortriptyline (PAMELOR) capsule 100 mg  100 mg Oral QHS Fuller Plan A, MD   100 mg at 09/24/21 2115  ? pantoprazole (PROTONIX) EC tablet  40 mg  40 mg Oral Daily Thurnell Lose, MD   40 mg at 09/24/21 1000  ? peg 3350 powder (MOVIPREP) kit 100 g  0.5 kit Oral Once Vena Rua, PA-C      ? potassium chloride SA (KLOR-CON M) CR tablet 40 mEq  40 mEq  Oral Daily Thurnell Lose, MD   40 mEq at 09/24/21 1001  ? sodium chloride flush (NS) 0.9 % injection 3 mL  3 mL Intravenous Q12H Smith, Rondell A, MD   3 mL at 09/24/21 2116  ? spironolactone (ALDACTONE) tablet 100 mg  100 mg Oral Daily Vena Rua, PA-C   100 mg at 09/24/21 1001  ? tapentadol (NUCYNTA) tablet 50 mg  50 mg Oral Daily Pham, Minh Q, RPH-CPP   50 mg at 09/24/21 2115  ? ? ? ?PHYSICAL EXAMINATION: ?ECOG PERFORMANCE STATUS: 0 - Asymptomatic ? ?Vitals:  ? 09/24/21 1705 09/24/21 1932  ?BP: (!) 115/58 131/60  ?Pulse:  (!) 105  ?Resp:  18  ?Temp: 97.6 ?F (36.4 ?C) 97.7 ?F (36.5 ?C)  ?SpO2:  98%  ? ?Filed Weights  ? 09/21/21 0107 09/23/21 0354 09/24/21 0500  ?Weight: (!) 325 lb (147.4 kg) (!) 306 lb 14.1 oz (139.2 kg) (!) 307 lb 15.7 oz (139.7 kg)  ? ?General appearance: Alert oriented and in no acute distress. ?Lower extremity swelling has improved compared to last visit. ? ?LABORATORY DATA:  ?I have reviewed the data as listed ?Lab Results  ?Component Value Date  ? WBC 7.8 09/24/2021  ? HGB 8.4 (L) 09/24/2021  ? HCT 26.9 (L) 09/24/2021  ? MCV 94.1 09/24/2021  ? PLT 170 09/24/2021  ? ?  Chemistry   ?   ?Component Value Date/Time  ? NA 140 09/24/2021 0058  ? NA 141 12/18/2019 1506  ? K 3.7 09/24/2021 0058  ? CL 104 09/24/2021 0058  ? CO2 28 09/24/2021 0058  ? BUN 12 09/24/2021 0058  ? BUN 16 12/18/2019 1506  ? CREATININE 1.04 (H) 09/24/2021 0058  ? CREATININE 1.09 (H) 12/24/2015 1649  ? GLU 138 (H) 09/19/2021 0920  ?    ?Component Value Date/Time  ? CALCIUM 8.8 (L) 09/24/2021 0058  ? ALKPHOS 102 09/24/2021 0058  ? AST 52 (H) 09/24/2021 0058  ? ALT 26 09/24/2021 0058  ? BILITOT 1.0 09/24/2021 0058  ? BILITOT 0.4 12/18/2019 1506  ?  ? ?I reviewed the labs today with the patient.  No  indication for blood transfusion today. ?Hemoglobin today at 8.6.  Slowly progressive nonimmune mediated hemolysis.  Hemoglobin at baseline was around 14 g/dL.  Mild thrombocytopenia with platelet count ranging from 100 220,000. ?

## 2021-09-25 ENCOUNTER — Inpatient Hospital Stay (HOSPITAL_COMMUNITY): Payer: Medicare Other | Admitting: Anesthesiology

## 2021-09-25 ENCOUNTER — Encounter (HOSPITAL_COMMUNITY): Payer: Self-pay | Admitting: Internal Medicine

## 2021-09-25 ENCOUNTER — Encounter (HOSPITAL_COMMUNITY)
Admission: EM | Disposition: A | Discharge status: Home / Self Care | Payer: Self-pay | Source: Home / Self Care | Attending: Internal Medicine

## 2021-09-25 ENCOUNTER — Ambulatory Visit (HOSPITAL_COMMUNITY): Payer: Medicare Other

## 2021-09-25 DIAGNOSIS — K2970 Gastritis, unspecified, without bleeding: Secondary | ICD-10-CM

## 2021-09-25 DIAGNOSIS — K7460 Unspecified cirrhosis of liver: Secondary | ICD-10-CM | POA: Diagnosis not present

## 2021-09-25 DIAGNOSIS — D696 Thrombocytopenia, unspecified: Secondary | ICD-10-CM | POA: Diagnosis not present

## 2021-09-25 DIAGNOSIS — J189 Pneumonia, unspecified organism: Secondary | ICD-10-CM | POA: Diagnosis not present

## 2021-09-25 DIAGNOSIS — D126 Benign neoplasm of colon, unspecified: Secondary | ICD-10-CM

## 2021-09-25 DIAGNOSIS — K635 Polyp of colon: Secondary | ICD-10-CM

## 2021-09-25 DIAGNOSIS — K766 Portal hypertension: Secondary | ICD-10-CM

## 2021-09-25 DIAGNOSIS — I8510 Secondary esophageal varices without bleeding: Secondary | ICD-10-CM

## 2021-09-25 DIAGNOSIS — K3189 Other diseases of stomach and duodenum: Secondary | ICD-10-CM

## 2021-09-25 HISTORY — PX: BIOPSY: SHX5522

## 2021-09-25 HISTORY — PX: COLONOSCOPY WITH PROPOFOL: SHX5780

## 2021-09-25 HISTORY — PX: ESOPHAGOGASTRODUODENOSCOPY: SHX5428

## 2021-09-25 HISTORY — PX: POLYPECTOMY: SHX5525

## 2021-09-25 LAB — CBC WITH DIFFERENTIAL/PLATELET
Abs Immature Granulocytes: 0.02 10*3/uL (ref 0.00–0.07)
Basophils Absolute: 0.1 10*3/uL (ref 0.0–0.1)
Basophils Relative: 2 %
Eosinophils Absolute: 0.3 10*3/uL (ref 0.0–0.5)
Eosinophils Relative: 5 %
HCT: 27.4 % — ABNORMAL LOW (ref 36.0–46.0)
Hemoglobin: 8.7 g/dL — ABNORMAL LOW (ref 12.0–15.0)
Immature Granulocytes: 0 %
Lymphocytes Relative: 35 %
Lymphs Abs: 2.4 10*3/uL (ref 0.7–4.0)
MCH: 29.5 pg (ref 26.0–34.0)
MCHC: 31.8 g/dL (ref 30.0–36.0)
MCV: 92.9 fL (ref 80.0–100.0)
Monocytes Absolute: 0.8 10*3/uL (ref 0.1–1.0)
Monocytes Relative: 12 %
Neutro Abs: 3.2 10*3/uL (ref 1.7–7.7)
Neutrophils Relative %: 46 %
Platelets: 179 10*3/uL (ref 150–400)
RBC: 2.95 MIL/uL — ABNORMAL LOW (ref 3.87–5.11)
RDW: 17.1 % — ABNORMAL HIGH (ref 11.5–15.5)
WBC: 6.9 10*3/uL (ref 4.0–10.5)
nRBC: 0 % (ref 0.0–0.2)

## 2021-09-25 LAB — COMPREHENSIVE METABOLIC PANEL
ALT: 26 U/L (ref 0–44)
AST: 58 U/L — ABNORMAL HIGH (ref 15–41)
Albumin: 3.7 g/dL (ref 3.5–5.0)
Alkaline Phosphatase: 97 U/L (ref 38–126)
Anion gap: 10 (ref 5–15)
BUN: 8 mg/dL (ref 8–23)
CO2: 26 mmol/L (ref 22–32)
Calcium: 9.2 mg/dL (ref 8.9–10.3)
Chloride: 106 mmol/L (ref 98–111)
Creatinine, Ser: 1.02 mg/dL — ABNORMAL HIGH (ref 0.44–1.00)
GFR, Estimated: >60 mL/min (ref >60)
Glucose, Bld: 100 mg/dL — ABNORMAL HIGH (ref 70–99)
Potassium: 3.4 mmol/L — ABNORMAL LOW (ref 3.5–5.1)
Sodium: 142 mmol/L (ref 135–145)
Total Bilirubin: 1.3 mg/dL — ABNORMAL HIGH (ref 0.3–1.2)
Total Protein: 7.5 g/dL (ref 6.5–8.1)

## 2021-09-25 LAB — MAGNESIUM: Magnesium: 2.1 mg/dL (ref 1.7–2.4)

## 2021-09-25 LAB — BRAIN NATRIURETIC PEPTIDE: B Natriuretic Peptide: 7.5 pg/mL (ref 0.0–100.0)

## 2021-09-25 LAB — GLUCOSE, CAPILLARY: Glucose-Capillary: 69 mg/dL — ABNORMAL LOW (ref 70–99)

## 2021-09-25 SURGERY — COLONOSCOPY WITH PROPOFOL
Anesthesia: Monitor Anesthesia Care

## 2021-09-25 MED ORDER — PROMETHAZINE HCL 25 MG/ML IJ SOLN: 6.2500 mg | INTRAMUSCULAR | Status: DC | PRN

## 2021-09-25 MED ORDER — HYDROMORPHONE HCL 1 MG/ML IJ SOLN: 0.2500 mg | INTRAMUSCULAR | Status: DC | PRN

## 2021-09-25 MED ORDER — LACTATED RINGERS IV SOLN: INTRAVENOUS | Status: DC

## 2021-09-25 MED ORDER — PHENYLEPHRINE 80 MCG/ML (10ML) SYRINGE FOR IV PUSH (FOR BLOOD PRESSURE SUPPORT)
PREFILLED_SYRINGE | INTRAVENOUS | Status: DC | PRN
  Administered 2021-09-25 (×3): 80 ug via INTRAVENOUS
  Administered 2021-09-25: 160 ug via INTRAVENOUS
  Administered 2021-09-25 (×2): 80 ug via INTRAVENOUS

## 2021-09-25 MED ORDER — LIDOCAINE 2% (20 MG/ML) 5 ML SYRINGE
INTRAMUSCULAR | Status: DC | PRN
Start: 2021-09-25 — End: 2021-09-25
  Administered 2021-09-25: 50 mg via INTRAVENOUS

## 2021-09-25 MED ORDER — OXYCODONE HCL 5 MG/5ML PO SOLN: 5.0000 mg | Freq: Once | ORAL | Status: DC | PRN

## 2021-09-25 MED ORDER — PROPOFOL 500 MG/50ML IV EMUL
INTRAVENOUS | Status: DC | PRN
  Administered 2021-09-25: 100 ug/kg/min via INTRAVENOUS

## 2021-09-25 MED ORDER — CARVEDILOL 3.125 MG PO TABS
3.1250 mg | ORAL_TABLET | Freq: Two times a day (BID) | ORAL | Status: DC
  Administered 2021-09-25 – 2021-09-26 (×2): 3.125 mg via ORAL
  Filled 2021-09-25 (×2): qty 1

## 2021-09-25 MED ORDER — POTASSIUM CHLORIDE CRYS ER 20 MEQ PO TBCR
40.0000 meq | EXTENDED_RELEASE_TABLET | Freq: Once | ORAL | Status: AC
  Administered 2021-09-25: 40 meq via ORAL
  Filled 2021-09-25: qty 2

## 2021-09-25 MED ORDER — OXYCODONE HCL 5 MG PO TABS: 5.0000 mg | ORAL_TABLET | Freq: Once | ORAL | Status: DC | PRN

## 2021-09-25 MED ORDER — PROPOFOL 10 MG/ML IV BOLUS
INTRAVENOUS | Status: DC | PRN
  Administered 2021-09-25: 20 mg via INTRAVENOUS
  Administered 2021-09-25: 30 mg via INTRAVENOUS
  Administered 2021-09-25 (×3): 20 mg via INTRAVENOUS

## 2021-09-25 SURGICAL SUPPLY — 22 items

## 2021-09-25 NOTE — Op Note (Signed)
[facility] ?Patient Name: Theresa Moran ?Procedure Date : 09/25/2021 ?MRN: [id number] ?Attending MD: Carlota Raspberry. Havery Moros , MD ?Date of Birth: Jun 18, 1960 ?CSN: [id number] ?Age: 61 ?Admit Type: Inpatient ?Procedure:                Colonoscopy ?Indications:              Iron deficiency anemia ?Providers:                Carlota Raspberry. Havery Moros, MD, Ladoris Gene, RN,  ?                          Gloris Ham, Technician ?Referring MD:              ?Medicines:                Monitored Anesthesia Care ?Complications:            No immediate complications. Estimated blood loss:  ?                          Minimal. ?Estimated Blood Loss:     Estimated blood loss was minimal. ?Procedure:                Pre-Anesthesia Assessment: ?                          - Prior to the procedure, a History and Physical  ?                          was performed, and patient medications and  ?                          allergies were reviewed. The patient's tolerance of  ?                          previous anesthesia was also reviewed. The risks  ?                          and benefits of the procedure and the sedation  ?                          options and risks were discussed with the patient.  ?                          All questions were answered, and informed consent  ?                          was obtained. Prior Anticoagulants: The patient has  ?                          taken no previous anticoagulant or antiplatelet  ?                          agents. ASA Grade Assessment: III - A patient with  ?                          severe  systemic disease. After reviewing the risks  ?                          and benefits, the patient was deemed in  ?                          satisfactory condition to undergo the procedure. ?                          After obtaining informed consent, the colonoscope  ?                          was passed under direct vision. Throughout the  ?                          procedure, the patient's blood  pressure, pulse, and  ?                          oxygen saturations were monitored continuously. The  ?                          CF-HQ190L ([id number]) Olympus coloscope was  ?                          introduced through the anus and advanced to the the  ?                          terminal ileum, with identification of the  ?                          appendiceal orifice and IC valve. The colonoscopy  ?                          was performed without difficulty. The patient  ?                          tolerated the procedure well. The quality of the  ?                          bowel preparation was adequate. The terminal ileum,  ?                          ileocecal valve, appendiceal orifice, and rectum  ?                          were photographed. ?Scope In: 8:06:26 AM ?Scope Out: 8:30:14 AM ?Scope Withdrawal Time: 0 hours 19 minutes 48 seconds  ?Total Procedure Duration: 0 hours 23 minutes 48 seconds  ?Findings: ?     The perianal and digital rectal examinations were normal. ?     The terminal ileum appeared normal. ?     A 5 to 6 mm polyp was found in the hepatic flexure. The polyp was  ?     sessile. The polyp was removed with a cold snare. Resection and  ?  retrieval were complete. ?     Scattered small-mouthed diverticula were found in the left colon and  ?     distal transverse colon. ?     There was a medium-sized lipoma, in the sigmoid colon. ?     Internal hemorrhoids were found during retroflexion. ?     Residual stool was noted throughout the colon - time was taken to lavage  ?     these areas to achieve adequate views. The exam was otherwise without  ?     abnormality. ?Impression:               - The examined portion of the ileum was normal. ?                          - One 5 to 6 mm polyp at the hepatic flexure,  ?                          removed with a cold snare. Resected and retrieved. ?                          - Diverticulosis in the left colon and distal  ?                          transverse  colon. ?                          - Medium-sized lipoma in the sigmoid colon. ?                          - Internal hemorrhoids. ?                          - The examination was otherwise normal. ?                          No cause for iron deficiency / anemia on this exam. ?Recommendation:           - Return patient to hospital ward for ongoing care. ?                          - Advance diet as tolerated. ?                          - Continue present medications. ?                          - Await pathology results. ?                          - Recommendations per EGD note ?Procedure Code(s):        --- Professional --- ?                          [phone], Colonoscopy, flexible; with removal of  ?                          tumor(s), polyp(s), or  other lesion(s) by snare  ?                          technique ?Diagnosis Code(s):        --- Professional --- ?                          Q76.2, Other hemorrhoids ?                          K63.5, Polyp of colon ?                          D17.5, Benign lipomatous neoplasm of  ?                          intra-abdominal organs ?                          D50.9, Iron deficiency anemia, unspecified ?                          K57.30, Diverticulosis of large intestine without  ?                          perforation or abscess without bleeding ?CPT copyright 2019 American Medical Association. All rights reserved. ?The codes documented in this report are preliminary and upon coder review may  ?be revised to meet current compliance requirements. ?Carlota Raspberry. Raekwon Winkowski, MD ?09/25/2021 8:37:20 AM ?This report has been signed electronically. ?Number of Addenda: 0 ?

## 2021-09-25 NOTE — Progress Notes (Signed)
?                                  PROGRESS NOTE                                             ?                                                                                                                     ?                                         ? ? Patient Demographics:  ? ? Theresa Moran, is a 61 y.o. female, DOB - 01/25/1961, GNO:[id number] ? ?Outpatient Primary MD for the patient is Tawnya Crook, MD    LOS - 4  Admit date - 09/21/2021   ? ?Chief Complaint  ?Patient presents with  ? Shortness of Breath  ?  BIBA from home, past few weeks Hgb and Hct have been low. Had X2 blood transfusions at [facility], pt states scheduled for blood transfusion on Monday. Productive cough X 3 days  ?    ? ?Brief Narrative (HPI from H&P)  61 y.o. female with medical history significant of neuropathy, anxiety/depression, hemolytic anemia, and thrombocytopenia who presents with complaints of shortness of breath.  She notes that everything is happened over the last 3 weeks. Patient has been being worked up in outpatient setting by oncology in regards to thrombocytopenia and anemia  Underwent bone marrow biopsy on 4/24 that noted hypercellular marrow with erythroid hyperplasia.  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  Patient had been referred to GI and is scheduled for EGD on 5/16. she has flow cytometry and other studies still pending.  6 days ago, she was transfused 2 units of packed red blood cells due to hemoglobin dropping down to 6.8 g/dL. ? ?Presented to the hospital with shortness of breath she was diagnosed with community-acquired pneumonia along with fluid overload and admitted to the hospital. ? ? ? Subjective:  ? ?Patient in bed, she denies any complaints today, reports dyspnea and cough has significantly improved, no nausea,  no vomiting. ? ? Assessment  & Plan :  ? ?CAP - with productive cough and right-sided infiltrate, monitor cultures has been placed on appropriate empiric antibiotics which will be continued, since infiltrate is predominantly on the right side we have involved speech as well.  Continue to monitor, improving clinically. ? ?Morbid obesity with recent suspicion of hepatic cirrhosis on outpatient imaging with suspicion of NASH.  Question if  she has underlying NASH ?-Input greatly appreciated, plan for endoscopy/colonoscopy tomorrow. ?- right upper quadrant ultrasound confirms cirrhosis, ?-Acute hepatitis panel unremarkable. ?-Blood pressure remains soft, continue with Midodrin, did add albumin as well.  Blood pressure has improved ?-On Lasix and Aldactone. ?-Post endoscopy and colonoscopy today, with evidence of early varices, will start on low-dose Coreg and uptitrate as blood pressure allows, if unable to tolerate then likely well need banding as discussed with GI. ? ?Recent diagnosis of hemolytic anemia, chronic thrombocytopenia.  Iron deficiency anemia ?-  under the care of oncologist Dr.Iruku, outpatient work-up being done she has undergone bone marrow biopsy with final results pending, oncology team is following. ?-plan for endoscopy , colonoscopy for evaluation of iron deficiency anemia ? ?Anxiety and depression.  Continue home regimen which includes BuSpar and nortriptyline. ? ?Fluid overload.  Stable echocardiogram, continue diuresis with Lasix and Aldactone monitor electrolytes closely. ? ?Persistent tachycardia, CTA stable, echocardiogram along with TSH nonacute and stable,  question if her hemolytic anemia is causing a high output state with underlying tachycardia. ? ?Obesity with BMI of 49 ? ?   ? ?Condition - Fair ? ?Family Communication  :  None present ? ?Code Status :  Full ? ?Consults  : GI, oncology ? ?PUD Prophylaxis : PPI ? ? Procedures  :    ? ?TTE - 1. Left ventricular ejection fraction, by  estimation, is 60 to 65%. The left ventricle has normal function. The left ventricle has no regional wall motion abnormalities. Left ventricular diastolic parameters were normal.  2. Right ventricular systolic function is normal. The right ventricular size is normal. There is moderately elevated pulmonary artery systolic pressure.  3. Left atrial size was mildly dilated.  4. The mitral valve is grossly normal. Mild mitral valve regurgitation. No evidence of mitral stenosis.  5. The aortic valve is grossly normal. There is mild calcification of the aortic valve. There is mild thickening of the aortic valve. Aortic valve regurgitation is not visualized. Aortic valve sclerosis/calcification is present, without any evidence of aortic stenosis.  6. The inferior vena cava is dilated in size with >50% respiratory variability, suggesting right atrial pressure of 8 mmHg. ? ?RUQ [place] - Status post cholecystectomy. Findings consistent with hepatic cirrhosis. No definite focal sonographic hepatic abnormality is noted, although imaging is somewhat limited due to body habitus. ? ?CTA -  1. Vascular contrast bolus had to be repeated. No pulmonary embolus identified. 2. Lower lung volumes from 10 days ago with small new layering pleural effusions and new with multifocal bilateral lower lobe and right middle lobe opacity most resembling Multifocal Pneumonia. 3. Cirrhosis.  Calcified coronary artery atherosclerosis ? ?   ? ?Disposition Plan  :   ? ?Status is: Inpatient ? ?DVT Prophylaxis  : We will hold subcu heparin given her anemia, she is compliant with SCDs and ambulatory. ? ?Place TED hose Start: 09/22/21 0555 ?SCDs Start: 09/21/21 0955 ?  ? ?Lab Results  ?Component Value Date  ? PLT 179 09/25/2021  ? ? ?Diet :  ?Diet Order   ? ?       ?  Diet full liquid Room service appropriate? Yes; Fluid consistency: Thin  Diet effective now       ?  ? ?  ?  ? ?  ?  ? ?Inpatient Medications ? ?Scheduled Meds: ? baclofen  10 mg Oral BID  ?  bisacodyl  20 mg Oral Once  ? busPIRone  10 mg Oral BID  ? carvedilol  3.125 mg Oral BID WC  ? furosemide  80 mg Oral Daily  ? gabapentin  600 mg Oral BID  ? guaiFENesin  600 mg Oral BID  ? midodrine  5 mg Oral TID WC  ? nortriptyline  100 mg Oral QHS  ? pantoprazole  40 mg Oral Daily  ? potassium chloride  40 mEq Oral Daily  ? sodium chloride flush  3 mL Intravenous Q12H  ? spironolactone  100 mg Oral Daily  ? tapentadol  50 mg Oral Daily  ? ?Continuous Infusions: ? albumin human 25 g (09/25/21 1007)  ? cefTRIAXone (ROCEPHIN)  IV 2 g (09/25/21 1255)  ? ?PRN Meds:.acetaminophen **OR** acetaminophen, albuterol, oxyCODONE **OR** oxyCODONE ? ? ? ?Phillips Climes M.D on 09/25/2021 at 1:09 PM ? ?To page go to [email or web address]  ? ?[facility] -  Office  [phone] ? ?See all Orders from today for further details ? ? ? Objective:  ? ?Vitals:  ? 09/25/21 0839 09/25/21 0854 09/25/21 0922 09/25/21 1212  ?BP: (!) 118/59 (!) 112/56 (!) 145/76 (!) 128/55  ?Pulse:  (!) 103 100 100  ?Resp: '20  17 17  ' ?Temp: (!) 97.5 ?F (36.4 ?C) 97.7 ?F (36.5 ?C) (!) 97.4 ?F (36.3 ?C) 97.7 ?F (36.5 ?C)  ?TempSrc:   Oral Oral  ?SpO2: 96% 97% 100% 100%  ?Weight:      ?Height:      ? ? ?Wt Readings from Last 3 Encounters:  ?09/25/21 95.3 kg  ?09/19/21 (!) 147 kg  ?09/18/21 (!) 147 kg  ? ? ? ?Intake/Output Summary (Last 24 hours) at 09/25/2021 1309 ?Last data filed at 09/25/2021 0854 ?Gross per 24 hour  ?Intake 1140 ml  ?Output --  ?Net 1140 ml  ? ? ? ? ?Physical Exam ? ?Awake Alert, Oriented X 3, No new F.N deficits, Normal affect ?Symmetrical Chest wall movement, Good air movement bilaterally, CTAB ?RRR,No Gallops,Rubs or new Murmurs, No Parasternal Heave ?+ve B.Sounds, Abd Soft, No tenderness, No rebound - guarding or rigidity. ?No Cyanosis, Clubbing ,+1 edema, No new Rash or bruise   ? ? ? Data Review:  ? ? ?CBC ?Recent Labs  ?Lab 09/21/21 ?0200 09/22/21 ?0206 09/23/21 ?0154 09/24/21 ?8453 09/25/21 ?6468  ?WBC 10.4 7.6 8.0 7.8 6.9  ?HGB 8.5* 8.1*  7.9* 8.4* 8.7*  ?HCT 26.9* 25.7* 25.1* 26.9* 27.4*  ?PLT 130* 134* 148* 170 179  ?MCV 95.4 93.8 93.7 94.1 92.9  ?MCH 30.1 29.6 29.5 29.4 29.5  ?MCHC 31.6 31.5 31.5 31.2 31.8  ?RDW 17.9* 17.4* 17.3* 17.2* 17.1*

## 2021-09-25 NOTE — Anesthesia Procedure Notes (Signed)
Procedure Name: [place] ?Date/Time: 09/25/2021 7:32 AM ?Performed by: Mariea Clonts, CRNA ?Pre-anesthesia Checklist: Patient identified, Emergency Drugs available, Suction available, Patient being monitored and Timeout performed ?Patient Re-evaluated:Patient Re-evaluated prior to induction ?Oxygen Delivery Method: Simple face mask and Nasal cannula ? ? ? ? ?

## 2021-09-25 NOTE — Progress Notes (Addendum)
[facility] ?CONSULT NOTE ? ?Patient Care Team: ?Tawnya Crook, MD as PCP - General (Family Medicine) ? ?CHIEF COMPLAINTS/PURPOSE OF CONSULTATION:  ?Thrombocytopenia. ? ?ASSESSMENT & PLAN:  ? ?This is a pleasant 61 year old female patient with past medical history significant for neuropathy anxiety/depression referred to hematology for evaluation of progressive thrombocytopenia.   ? ?On evaluation of labs, she was found to have progressive anemia, nonimmune mediated hemolysis with reticulocyte count around 5%.  No elevated LDH or total bilirubin.  DAT test is negative, there appears to be some complement consumption.  Given no clear etiology for the nonimmune mediated hemolytic anemia, recommended imaging which showed hepatic cirrhosis and splenomegaly, diffuse thickening of the stomach concerning for infiltrative diseases such as lymphoma or inflammation.  Endoscopy was recommended.   ?She is admitted and treated for CAP.  ? ?She is recovering well.  She had an EGD and colonoscopy performed earlier today and no obvious cause for iron deficiency anemia was found.  The stomach was found to be normal with some mucosal changes.  This was biopsied and these results are currently pending. ?She is status post 1 dose of Feraheme 510 mg IV on 09/23/2021 which she tolerated well. ?Her hemoglobin is slowly improving. ?Hb greater than 8, no indication for transfusion. ?We will arrange for follow up outpatient. ? ?[name], DNP, AGPCNP-BC, AOCNP ? ?SUBJECTIVE:  ?Received a dose of Feraheme on 09/23/2021 and tolerated well ?Had an EGD and colonoscopy performed this morning ?Colonoscopy showed 1 polyp at the hepatic flexure which was removed and diverticulosis.  There was also a medium sized lipoma in the sigmoid colon and internal hemorrhoids. ?EGD showed esophageal varices which were mostly small and there was no high risk stigmata for bleeding, very mild portal hypertensive gastropathy which was biopsied,  normal stomach with mucosal changes which was biopsied. ?She reported some bleeding last evening-she was unsure if it was from her rectum, but no other bleeding has been noted ?Lower extremity edema improved ? ?MEDICAL HISTORY:  ?Past Medical History:  ?Diagnosis Date  ? Allergy   ? Anxiety   ? Asthma   ? in the past  ? Chronic pain   ? Depression   ? Gallstones   ? GERD (gastroesophageal reflux disease)   ? Headache   ? stopped after menopause  ? Neuromuscular disorder ([place])   ? Urticaria   ? ? ?SURGICAL HISTORY: ?Past Surgical History:  ?Procedure Laterality Date  ? APPENDECTOMY  1979  ? BREAST REDUCTION SURGERY    ? BREAST SURGERY  1980  ? breast reduction  ? CHOLECYSTECTOMY    ? NASAL SEPTUM SURGERY    ? ORIF ANKLE FRACTURE Left 03/26/2021  ? Procedure: OPEN REDUCTION INTERNAL FIXATION (ORIF) ANKLE FRACTURE;  Surgeon: Armond Hang, MD;  Location: [facility];  Service: Orthopedics;  Laterality: Left;  ? ? ?SOCIAL HISTORY: ?Social History  ? ?Socioeconomic History  ? Marital status: Married  ?  Spouse name: Not on file  ? Number of children: Not on file  ? Years of education: Not on file  ? Highest education level: Not on file  ?Occupational History  ? Not on file  ?Tobacco Use  ? Smoking status: Former  ?  Packs/day: 1.00  ?  Years: 15.00  ?  Pack years: 15.00  ?  Types: Cigarettes  ?  Quit date: 01/2004  ?  Years since quitting: 17.6  ? Smokeless tobacco: Never  ?Vaping Use  ? Vaping Use: Never used  ?Substance  and Sexual Activity  ? Alcohol use: Not Currently  ? Drug use: No  ? Sexual activity: Not Currently  ?Other Topics Concern  ? Not on file  ?Social History Narrative  ? Has 1 child(artificial insem), 2 adopted, and 1 step from partner's former partner  ? Raising 4 grandchildren.  ? ?Social Determinants of Health  ? ?Financial Resource Strain: Not on file  ?Food Insecurity: Not on file  ?Transportation Needs: Not on file  ?Physical Activity: Not on file  ?Stress: Not on file  ?Social Connections: Not on  file  ?Intimate Partner Violence: Not on file  ? ? ?FAMILY HISTORY: ?Family History  ?Problem Relation Age of Onset  ? Intellectual disability Mother   ? Hypertension Mother   ? Hyperlipidemia Mother   ? Depression Mother   ? Arthritis Mother   ? Mental illness Mother   ? Heart disease Father   ? Diabetes Father   ? Stroke Father   ? Heart disease Brother   ? COPD Brother   ? Mental illness Daughter   ? Learning disabilities Daughter   ? Diabetes Daughter   ? Allergic rhinitis Neg Hx   ? Angioedema Neg Hx   ? Asthma Neg Hx   ? Eczema Neg Hx   ? Immunodeficiency Neg Hx   ? Urticaria Neg Hx   ? Colon cancer Neg Hx   ? Pancreatic cancer Neg Hx   ? Liver cancer Neg Hx   ? Stomach cancer Neg Hx   ? ? ?ALLERGIES:  is allergic to shellfish allergy and shellfish-derived products. ? ?MEDICATIONS:  ?Current Facility-Administered Medications  ?Medication Dose Route Frequency Provider Last Rate Last Admin  ? acetaminophen (TYLENOL) tablet 650 mg  650 mg Oral Q6H PRN Norval Morton, MD      ? Or  ? acetaminophen (TYLENOL) suppository 650 mg  650 mg Rectal Q6H PRN Fuller Plan A, MD      ? albumin human 25 % solution 25 g  25 g Intravenous Q6H Elgergawy, Silver Huguenin, MD 60 mL/hr at 09/25/21 1007 25 g at 09/25/21 1007  ? albuterol (PROVENTIL) (2.5 MG/3ML) 0.083% nebulizer solution 2.5 mg  2.5 mg Nebulization Q2H PRN Fuller Plan A, MD      ? azithromycin (ZITHROMAX) 500 mg in sodium chloride 0.9 % 250 mL IVPB  500 mg Intravenous Q24H Smith, Rondell A, MD 250 mL/hr at 09/25/21 1016 500 mg at 09/25/21 1016  ? baclofen (LIORESAL) tablet 10 mg  10 mg Oral BID Fuller Plan A, MD   10 mg at 09/25/21 1023  ? bisacodyl (DULCOLAX) EC tablet 20 mg  20 mg Oral Once Vena Rua, PA-C      ? busPIRone (BUSPAR) tablet 10 mg  10 mg Oral BID Fuller Plan A, MD   10 mg at 09/25/21 1022  ? cefTRIAXone (ROCEPHIN) 2 g in sodium chloride 0.9 % 100 mL IVPB  2 g Intravenous Q24H Norval Morton, MD   Stopped at 09/24/21 1821  ? furosemide  (LASIX) tablet 80 mg  80 mg Oral Daily Thurnell Lose, MD   80 mg at 09/25/21 1023  ? gabapentin (NEURONTIN) tablet 600 mg  600 mg Oral BID Fuller Plan A, MD   600 mg at 09/25/21 1022  ? guaiFENesin (MUCINEX) 12 hr tablet 600 mg  600 mg Oral BID Tamala Julian, Rondell A, MD   600 mg at 09/25/21 1022  ? heparin injection 5,000 Units  5,000 Units Subcutaneous Q8H Singh, Prashant K,  MD   5,000 Units at 09/25/21 0558  ? midodrine (PROAMATINE) tablet 5 mg  5 mg Oral TID WC Thurnell Lose, MD   5 mg at 09/24/21 1729  ? nortriptyline (PAMELOR) capsule 100 mg  100 mg Oral QHS Fuller Plan A, MD   100 mg at 09/24/21 2115  ? oxyCODONE (Oxy IR/ROXICODONE) immediate release tablet 5 mg  5 mg Oral Once PRN Lynda Rainwater, MD      ? Or  ? oxyCODONE (ROXICODONE) 5 MG/5ML solution 5 mg  5 mg Oral Once PRN Lynda Rainwater, MD      ? pantoprazole (PROTONIX) EC tablet 40 mg  40 mg Oral Daily Thurnell Lose, MD   40 mg at 09/25/21 1022  ? potassium chloride SA (KLOR-CON M) CR tablet 40 mEq  40 mEq Oral Daily Thurnell Lose, MD   40 mEq at 09/25/21 1023  ? sodium chloride flush (NS) 0.9 % injection 3 mL  3 mL Intravenous Q12H Smith, Rondell A, MD   3 mL at 09/25/21 1024  ? spironolactone (ALDACTONE) tablet 100 mg  100 mg Oral Daily Vena Rua, PA-C   100 mg at 09/25/21 1022  ? tapentadol (NUCYNTA) tablet 50 mg  50 mg Oral Daily Pham, Minh Q, RPH-CPP   50 mg at 09/24/21 2115  ? ? ? ?PHYSICAL EXAMINATION: ?ECOG PERFORMANCE STATUS: 0 - Asymptomatic ? ?Vitals:  ? 09/25/21 0854 09/25/21 0922  ?BP: (!) 112/56 (!) 145/76  ?Pulse: (!) 103 100  ?Resp:  17  ?Temp: 97.7 ?F (36.5 ?C) (!) 97.4 ?F (36.3 ?C)  ?SpO2: 97% 100%  ? ?Filed Weights  ? 09/24/21 0500 09/25/21 0500 09/25/21 0723  ?Weight: (!) 139.7 kg 95.3 kg 95.3 kg  ? ?General appearance: Alert oriented and in no acute distress. ?Lower extremity swelling has improved compared to last visit. ? ?LABORATORY DATA:  ?I have reviewed the data as listed ?Lab Results   ?Component Value Date  ? WBC 6.9 09/25/2021  ? HGB 8.7 (L) 09/25/2021  ? HCT 27.4 (L) 09/25/2021  ? MCV 92.9 09/25/2021  ? PLT 179 09/25/2021  ? ?  Chemistry   ?   ?Component Value Date/Time  ? NA 142 09/25/2021 02

## 2021-09-25 NOTE — Progress Notes (Addendum)
?   09/25/21 0616  ?Vitals  ?Temp 97.7 ?F (36.5 ?C)  ?Temp Source Oral  ?BP (!) 115/46  ?MAP (mmHg) (!) 64  ?BP Location Right Arm  ?BP Method Automatic  ?Patient Position (if appropriate) Lying  ?Pulse Rate (!) 115  ?Pulse Rate Source Monitor  ?ECG Heart Rate (!) 115  ?Resp 20  ?MEWS COLOR  ?MEWS Score Color Yellow  ?Oxygen Therapy  ?SpO2 97 %  ?Pain Assessment  ?Pain Scale 0-10  ?Pain Score 2  ?Pain Type Chronic pain  ?Pain Location Foot  ?Pain Orientation Left  ?Pain Descriptors / Indicators Aching  ?Pain Frequency Constant  ?Pain Onset On-going  ?Patients Stated Pain Goal 2  ?Pain Intervention(s) Repositioned  ?MEWS Score  ?MEWS Temp 0  ?MEWS Systolic 0  ?MEWS Pulse 2  ?MEWS RR 0  ?MEWS LOC 0  ?MEWS Score 2  ?Provider Notification  ?Date Provider Notified 09/25/21  ?Time Provider Notified [phone]  ?Provider response No new orders  ?Date of Provider Response 09/25/21  ?Time of Provider Response [phone]  ? ?Pt noted with bright red blood when up to bathroom, happened earlier in shift however it appeared to be from injection site on lower abdomen. Unable to identify definite source, however upon assessment this morning, it appears to be coming from vaginal area. Pt not actively bleeding. Pt for EGD/colonoscopy this am. No blood noted in stool with prep. Pt noted Dr. Cyd Silence notified, no new orders at this time ?

## 2021-09-25 NOTE — Anesthesia Preprocedure Evaluation (Signed)
Anesthesia Evaluation  ?Patient identified by MRN, date of birth, ID band ?Patient awake ? ? ? ?Reviewed: ?Allergy & Precautions, NPO status , Patient's Chart, lab work & pertinent test results ? ?Airway ?Mallampati: II ? ?TM Distance: >3 FB ?Neck ROM: Full ? ? ? Dental ?no notable dental hx. ? ?  ?Pulmonary ?asthma , former smoker,  ?  ?Pulmonary exam normal ?breath sounds clear to auscultation ? ? ? ? ? ? Cardiovascular ?negative cardio ROS ?Normal cardiovascular exam ?Rhythm:Regular Rate:Normal ? ? ?  ?Neuro/Psych ? Headaches, Anxiety Depression negative psych ROS  ? GI/Hepatic ?Neg liver ROS, GERD  ,  ?Endo/Other  ?negative endocrine ROS ? Renal/GU ?negative Renal ROS  ?negative genitourinary ?  ?Musculoskeletal ?negative musculoskeletal ROS ?(+)  ? Abdominal ?(+) + obese,   ?Peds ?negative pediatric ROS ?(+)  Hematology ? ?(+) Blood dyscrasia, anemia ,   ?Anesthesia Other Findings ? ? Reproductive/Obstetrics ?negative OB ROS ? ?  ? ? ? ? ? ? ? ? ? ? ? ? ? ?  ?  ? ? ? ? ? ? ? ? ?Anesthesia Physical ?Anesthesia Plan ? ?ASA: 3 ? ?Anesthesia Plan: MAC  ? ?Post-op Pain Management: Minimal or no pain anticipated  ? ?Induction: Intravenous ? ?PONV Risk Score and Plan: 2 and Propofol infusion, Ondansetron and Treatment may vary due to age or medical condition ? ?Airway Management Planned: Nasal Cannula ? ?Additional Equipment:  ? ?Intra-op Plan:  ? ?Post-operative Plan:  ? ?Informed Consent: I have reviewed the patients History and Physical, chart, labs and discussed the procedure including the risks, benefits and alternatives for the proposed anesthesia with the patient or authorized representative who has indicated his/her understanding and acceptance.  ? ? ? ?Dental advisory given ? ?Plan Discussed with: CRNA ? ?Anesthesia Plan Comments:   ? ? ? ? ? ? ?Anesthesia Quick Evaluation ? ?

## 2021-09-25 NOTE — Anesthesia Postprocedure Evaluation (Signed)
Anesthesia Post Note ? ?Patient: Theresa Moran ? ?Procedure(s) Performed: COLONOSCOPY WITH PROPOFOL ?ESOPHAGOGASTRODUODENOSCOPY (EGD) ?BIOPSY ?POLYPECTOMY ? ?  ? ?Patient location during evaluation: PACU ?Anesthesia Type: MAC ?Level of consciousness: awake and alert ?Pain management: pain level controlled ?Vital Signs Assessment: post-procedure vital signs reviewed and stable ?Respiratory status: spontaneous breathing, nonlabored ventilation and respiratory function stable ?Cardiovascular status: blood pressure returned to baseline and stable ?Postop Assessment: no apparent nausea or vomiting ?Anesthetic complications: no ? ? ?No notable events documented. ? ?Last Vitals:  ?Vitals:  ? 09/25/21 0854 09/25/21 0922  ?BP: (!) 112/56 (!) 145/76  ?Pulse: (!) 103 100  ?Resp:  17  ?Temp: 36.5 ?C (!) 36.3 ?C  ?SpO2: 97% 100%  ?  ?Last Pain:  ?Vitals:  ? 09/25/21 0922  ?TempSrc: Oral  ?PainSc:   ? ? ?  ?  ?  ?  ?  ?  ? ?Lynda Rainwater ? ? ? ? ?

## 2021-09-25 NOTE — Progress Notes (Signed)
HOSPITAL MEDICINE OVERNIGHT EVENT NOTE   ? ?Notified by nursing that patient exhibited a and an episode of a small amount of bright red bleeding while the patient was up in the bathroom.  Nursing states that it appeared that this bleeding was vaginal. ? ?Patient is hemodynamically stable.  Hemoglobin this morning is 8.7, and increased from the last recorded value of 8.4 yesterday. ? ?Patient is being followed by hematology for nonimmune mediated hemolytic anemia with a number of findings identified during this work-up concerning for infiltrative disease such as lymphoma.  Patient is to undergo endoscopic work-up today. ? ?Per my discussion with nursing, it seems that even the possibility of vaginal bleeding is in question.  We will continue to monitor for any recurrent episodes of bleeding and if patient is indeed still exhibiting vaginal bleeding patient may require further work-up such as transvaginal ultrasound. ? ?Vernelle Emerald  MD ?[facility]  ? ? ? ? ? ? ? ? ?.so ?

## 2021-09-25 NOTE — Interval H&P Note (Signed)
History and Physical Interval Note: Patient here for EGD and colonoscopy to evaluate IDA, gastric thickening on CT Scan, dysphagia, history of cirrhosis. I have discussed risks / benefits with her and she understands and wants to proceed. She is feeling better from respiratory standpoint without any breathing problems today. Her LE edema is also improved. Further recommendations pending the results.  ? ?09/25/2021 ?7:27 AM ? ?Theresa Moran  has presented today for surgery, with the diagnosis of Anemia.  Cirrhosis new diagnosis.  FOBT status unknown..  The various methods of treatment have been discussed with the patient and family. After consideration of risks, benefits and other options for treatment, the patient has consented to  Procedure(s): ?COLONOSCOPY WITH PROPOFOL (N/A) ?ESOPHAGOGASTRODUODENOSCOPY (EGD) (N/A) as a surgical intervention.  The patient's history has been reviewed, patient examined, no change in status, stable for surgery.  I have reviewed the patient's chart and labs.  Questions were answered to the patient's satisfaction.   ? ? ?Theresa Moran ? ? ?

## 2021-09-25 NOTE — Transfer of Care (Signed)
Immediate Anesthesia Transfer of Care Note ? ?Patient: Theresa Moran ? ?Procedure(s) Performed: COLONOSCOPY WITH PROPOFOL ?ESOPHAGOGASTRODUODENOSCOPY (EGD) ?BIOPSY ?POLYPECTOMY ? ?Patient Location: PACU ? ?Anesthesia Type:MAC ? ?Level of Consciousness: awake, alert  and oriented ? ?Airway & Oxygen Therapy: Patient Spontanous Breathing and Patient connected to nasal cannula oxygen ? ?Post-op Assessment: Report given to RN and Post -op Vital signs reviewed and stable ? ?Post vital signs: Reviewed and stable ? ?Last Vitals:  ?Vitals Value Taken Time  ?BP    ?Temp    ?Pulse    ?Resp    ?SpO2    ? ? ?Last Pain:  ?Vitals:  ? 09/25/21 0723  ?TempSrc: Oral  ?PainSc:   ?   ? ?Patients Stated Pain Goal: 2 (09/25/21 3568) ? ?Complications: No notable events documented. ?

## 2021-09-25 NOTE — Progress Notes (Signed)
?   09/25/21 2005  ?Assess: MEWS Score  ?Temp 97.6 ?F (36.4 ?C)  ?BP (!) 105/41  ?Pulse Rate (!) 111  ?ECG Heart Rate (!) 111  ?Resp 16  ?Level of Consciousness Alert  ?SpO2 97 %  ?O2 Device Room Air  ?Assess: MEWS Score  ?MEWS Temp 0  ?MEWS Systolic 0  ?MEWS Pulse 2  ?MEWS RR 0  ?MEWS LOC 0  ?MEWS Score 2  ?MEWS Score Color Yellow  ?Assess: if the MEWS score is Yellow or Red  ?Were vital signs taken at a resting state? Yes  ?Focused Assessment No change from prior assessment  ?Early Detection of Sepsis Score *See Row Information* Low  ?MEWS guidelines implemented *See Row Information* Yes  ?Treat  ?Pain Scale 0-10  ?Pain Score 3  ?Pain Type Chronic pain  ?Pain Location Foot  ?Pain Orientation Left  ?Pain Descriptors / Indicators Discomfort  ?Patients Stated Pain Goal 2  ?Pain Intervention(s) Medication (See eMAR) ?(scheduled pain med)  ?Take Vital Signs  ?Increase Vital Sign Frequency  Yellow: Q 2hr X 2 then Q 4hr X 2, if remains yellow, continue Q 4hrs  ?Escalate  ?MEWS: Escalate Yellow: discuss with charge nurse/RN and consider discussing with provider and RRT  ?Notify: Charge Nurse/RN  ?Name of Charge Nurse/RN Notified Webb Silversmith, RN  ?Date Charge Nurse/RN Notified 09/25/21  ?Time Charge Nurse/RN Notified 2005  ?Document  ?Patient Outcome Stabilized after interventions  ?Progress note created (see row info) Yes  ? ? ?

## 2021-09-25 NOTE — Op Note (Signed)
[facility] ?Patient Name: Theresa Moran ?Procedure Date : 09/25/2021 ?MRN: [id number] ?Attending MD: Carlota Raspberry. Havery Moros , MD ?Date of Birth: 12/18/1960 ?CSN: [id number] ?Age: 61 ?Admit Type: Inpatient ?Procedure:                Upper GI endoscopy ?Indications:              Iron deficiency anemia, Dysphagia, Abnormal CT of  ?                          the GI tract - gastric thickening, cirrhosis - rule  ?                          out varices ?Providers:                Carlota Raspberry. Havery Moros, MD, Ladoris Gene, RN,  ?                          Gloris Ham, Technician ?Referring MD:              ?Medicines:                Monitored Anesthesia Care ?Complications:            No immediate complications. Estimated blood loss:  ?                          Minimal. ?Estimated Blood Loss:     Estimated blood loss was minimal. ?Procedure:                Pre-Anesthesia Assessment: ?                          - Prior to the procedure, a History and Physical  ?                          was performed, and patient medications and  ?                          allergies were reviewed. The patient's tolerance of  ?                          previous anesthesia was also reviewed. The risks  ?                          and benefits of the procedure and the sedation  ?                          options and risks were discussed with the patient.  ?                          All questions were answered, and informed consent  ?                          was obtained. Prior Anticoagulants: The patient has  ?  taken no previous anticoagulant or antiplatelet  ?                          agents. ASA Grade Assessment: III - A patient with  ?                          severe systemic disease. After reviewing the risks  ?                          and benefits, the patient was deemed in  ?                          satisfactory condition to undergo the procedure. ?                          After obtaining informed  consent, the endoscope was  ?                          passed under direct vision. Throughout the  ?                          procedure, the patient's blood pressure, pulse, and  ?                          oxygen saturations were monitored continuously. The  ?                          [id number]-[id number] ([id number] Olympus endoscope was introduced  ?                          through the mouth, and advanced to the second part  ?                          of duodenum. The upper GI endoscopy was  ?                          accomplished without difficulty. The patient  ?                          tolerated the procedure well. ?Scope In: ?Scope Out: ?Findings: ?     Esophagogastric landmarks were identified: the Z-line was found at 38  ?     cm, the gastroesophageal junction was found at 38 cm and the upper  ?     extent of the gastric folds was found at 38 cm from the incisors. ?     Varices were found in the lower third of the esophagus. They were mostly  ?     small in size, one moderate column noted - no high risk stigmata. ?     The exam of the esophagus was otherwise normal. ?     Very mild portal hypertensive gastropathy was found in the entire  ?     examined stomach - snakeskin appearance. Biopsies were taken with a cold  ?     forceps for Helicobacter pylori testing. ?     The exam of  the stomach was otherwise normal. No gastric varices. ?     Focal mucosal changes were found in the duodenal bulb - I suspect benign  ?     peptic duodenitis but biopsies were taken with a cold forceps for  ?     histology to ensure no adenomatous change. ?     The duodenal sweep was quite angulated and difficult to visualize.  ?     Multiple passes made - no clear abnormality noted. Mild superficial  ?     endoscopic trauma from traversing the area. The exam of the duodenum was  ?     otherwise normal. ?Impression:               - Esophagogastric landmarks identified. ?                          - Esophageal varices - mostly small, one column  of  ?                          moderate - no high risk stigmata for bleeding ?                          - Portal hypertensive gastropathy - very mild.  ?                          Biopsied. ?                          - Normal stomach otherwise - no gastric varices ?                          - Mucosal changes in the duodenum as outlined.  ?                          Biopsied. ?                          - Angulated duodenal sweep ?                          - Normal duodenum otherwise ?                          No concerning cause for dysphagia seen. Symptoms  ?                          mild, empiric dilation not performed (discussed  ?                          this with patient pre-procedure and she did not  ?                          want empiric dilation). NO concerning changes in  ?                          the stomach in regards to prior CT changes, which  ?  could have represented residual food. ?Recommendation:           - Return patient to hospital ward for ongoing care. ?                          - Advance diet as tolerated. ?                          - Continue present medications. ?                          - Await pathology results. ?                          - Ideally would start nadolol or propranolol at  ?                          some point in time for varices and portal HTN, but  ?                          if edema / diuretics management do not allow for  ?                          that, can do banding of the moderate varix. Will  ?                          discuss with the patient ?Procedure Code(s):        --- Professional --- ?                          [phone], Esophagogastroduodenoscopy, flexible,  ?                          transoral; with biopsy, single or multiple ?Diagnosis Code(s):        --- Professional --- ?                          I85.00, Esophageal varices without bleeding ?                          K76.6, Portal hypertension ?                          K31.89, Other  diseases of stomach and duodenum ?                          D50.9, Iron deficiency anemia, unspecified ?                          R13.10, Dysphagia, unspecified ?                          R93.3, Abnormal findings on diagnostic imaging of  ?                          other parts of digestive tract ?CPT copyright 2019 American Medical  Association. All rights reserved. ?The codes documented in this report are preliminary and upon coder review may  ?be revised to meet current compliance requirements. ?Carlota Raspberry. Milica Gully, MD ?09/25/2021 8:45:51 AM ?This report has been signed electronically. ?Number of Addenda: 0 ?

## 2021-09-26 ENCOUNTER — Inpatient Hospital Stay (HOSPITAL_COMMUNITY): Payer: Medicare Other

## 2021-09-26 ENCOUNTER — Encounter (HOSPITAL_COMMUNITY): Payer: Self-pay | Admitting: Gastroenterology

## 2021-09-26 DIAGNOSIS — D599 Acquired hemolytic anemia, unspecified: Secondary | ICD-10-CM | POA: Diagnosis not present

## 2021-09-26 DIAGNOSIS — K2990 Gastroduodenitis, unspecified, without bleeding: Secondary | ICD-10-CM

## 2021-09-26 DIAGNOSIS — J189 Pneumonia, unspecified organism: Secondary | ICD-10-CM | POA: Diagnosis not present

## 2021-09-26 DIAGNOSIS — K2970 Gastritis, unspecified, without bleeding: Secondary | ICD-10-CM

## 2021-09-26 DIAGNOSIS — K7460 Unspecified cirrhosis of liver: Secondary | ICD-10-CM | POA: Diagnosis not present

## 2021-09-26 LAB — CBC WITH DIFFERENTIAL/PLATELET
Abs Immature Granulocytes: 0.06 10*3/uL (ref 0.00–0.07)
Basophils Absolute: 0.1 10*3/uL (ref 0.0–0.1)
Basophils Relative: 2 %
Eosinophils Absolute: 0.3 10*3/uL (ref 0.0–0.5)
Eosinophils Relative: 4 %
HCT: 23.5 % — ABNORMAL LOW (ref 36.0–46.0)
Hemoglobin: 7.5 g/dL — ABNORMAL LOW (ref 12.0–15.0)
Immature Granulocytes: 1 %
Lymphocytes Relative: 31 %
Lymphs Abs: 2.3 10*3/uL (ref 0.7–4.0)
MCH: 29.8 pg (ref 26.0–34.0)
MCHC: 31.9 g/dL (ref 30.0–36.0)
MCV: 93.3 fL (ref 80.0–100.0)
Monocytes Absolute: 0.8 10*3/uL (ref 0.1–1.0)
Monocytes Relative: 10 %
Neutro Abs: 4 10*3/uL (ref 1.7–7.7)
Neutrophils Relative %: 52 %
Platelets: 165 10*3/uL (ref 150–400)
RBC: 2.52 MIL/uL — ABNORMAL LOW (ref 3.87–5.11)
RDW: 17.2 % — ABNORMAL HIGH (ref 11.5–15.5)
WBC: 7.6 10*3/uL (ref 4.0–10.5)
nRBC: 0.3 % — ABNORMAL HIGH (ref 0.0–0.2)

## 2021-09-26 LAB — CULTURE, BLOOD (ROUTINE X 2)
Culture: NO GROWTH
Culture: NO GROWTH
Special Requests: ADEQUATE
Special Requests: ADEQUATE

## 2021-09-26 LAB — BRAIN NATRIURETIC PEPTIDE: B Natriuretic Peptide: 33.6 pg/mL (ref 0.0–100.0)

## 2021-09-26 LAB — COMPREHENSIVE METABOLIC PANEL
ALT: 22 U/L (ref 0–44)
AST: 44 U/L — ABNORMAL HIGH (ref 15–41)
Albumin: 4 g/dL (ref 3.5–5.0)
Alkaline Phosphatase: 105 U/L (ref 38–126)
Anion gap: 7 (ref 5–15)
BUN: 9 mg/dL (ref 8–23)
CO2: 27 mmol/L (ref 22–32)
Calcium: 9.1 mg/dL (ref 8.9–10.3)
Chloride: 108 mmol/L (ref 98–111)
Creatinine, Ser: 1.06 mg/dL — ABNORMAL HIGH (ref 0.44–1.00)
GFR, Estimated: 59 mL/min — ABNORMAL LOW (ref >60)
Glucose, Bld: 99 mg/dL (ref 70–99)
Potassium: 4.2 mmol/L (ref 3.5–5.1)
Sodium: 142 mmol/L (ref 135–145)
Total Bilirubin: 1 mg/dL (ref 0.3–1.2)
Total Protein: 7 g/dL (ref 6.5–8.1)

## 2021-09-26 LAB — MAGNESIUM: Magnesium: 2.3 mg/dL (ref 1.7–2.4)

## 2021-09-26 MED ORDER — GADOBUTROL 1 MMOL/ML IV SOLN
10.0000 mL | Freq: Once | INTRAVENOUS | Status: AC | PRN
  Administered 2021-09-26: 10 mL via INTRAVENOUS

## 2021-09-26 MED ORDER — SPIRONOLACTONE 25 MG PO TABS
50.0000 mg | ORAL_TABLET | Freq: Every day | ORAL | Status: DC
Start: 2021-09-26 — End: 2021-09-27
  Administered 2021-09-26: 50 mg via ORAL
  Filled 2021-09-26: qty 2

## 2021-09-26 MED ORDER — LORAZEPAM 2 MG/ML IJ SOLN
0.5000 mg | Freq: Once | INTRAMUSCULAR | Status: AC | PRN
  Administered 2021-09-26: 0.5 mg via INTRAVENOUS
  Filled 2021-09-26: qty 1

## 2021-09-26 MED ORDER — FUROSEMIDE 40 MG PO TABS
40.0000 mg | ORAL_TABLET | Freq: Every day | ORAL | Status: DC
  Administered 2021-09-26: 40 mg via ORAL
  Filled 2021-09-26: qty 1

## 2021-09-26 MED ORDER — CARVEDILOL 6.25 MG PO TABS
6.2500 mg | ORAL_TABLET | Freq: Two times a day (BID) | ORAL | Status: DC
  Administered 2021-09-26: 6.25 mg via ORAL
  Filled 2021-09-26: qty 1

## 2021-09-26 MED ORDER — POTASSIUM CHLORIDE CRYS ER 20 MEQ PO TBCR
20.0000 meq | EXTENDED_RELEASE_TABLET | Freq: Every day | ORAL | Status: DC
  Administered 2021-09-27 – 2021-09-28 (×2): 20 meq via ORAL
  Filled 2021-09-26 (×2): qty 1

## 2021-09-26 NOTE — Progress Notes (Signed)
? ?   Progress Note ? ? Subjective  ?Chief Complaint: Iron deficiency anemia, newly diagnosed cirrhosis, admitted with progressive lower extremity and upper extremity edema ? ?Status post EGD and colonoscopy yesterday.  EGD with mostly small esophageal varices, portal hypertensive gastropathy which was mild and otherwise normal.  That time discussed ideally would start Nadolol or captopril at some point but if edema/diuretic management did not allow for that then would have to hold.  She did not want to band the moderate varix at this time as it seemed too invasive.  Colonoscopy with 1 polyp at the hepatic flexure and diverticulosis in the left and transverse colon as well as internal hemorrhoids. ? ?Today, the patient tells me she is doing well.  She did have a bowel movement with some small amount of blood in it yesterday.  She seems to be feeling about the same as yesterday and does ask if we can get her MRI of liver done while she is hospitalized. ? ? Objective  ? ?Vital signs in last 24 hours: ?Temp:  [97.6 ?F (36.4 ?C)-98.5 ?F (36.9 ?C)] 97.7 ?F (36.5 ?C) (05/05 0755) ?Pulse Rate:  [100-111] 104 (05/05 0755) ?Resp:  [16-17] 16 (05/05 0755) ?BP: (89-128)/(40-72) 107/49 (05/05 0755) ?SpO2:  [92 %-100 %] 92 % (05/05 0755) ?Last BM Date : 09/24/21 ?General:    white female in NAD ?Heart:  Regular rate and rhythm; no murmurs ?Lungs: Respirations even and unlabored, lungs CTA bilaterally ?Abdomen:  Soft, nontender and nondistended. Normal bowel sounds. ?Psych:  Cooperative. Normal mood and affect. ? ?Intake/Output from previous day: ?05/04 0701 - 05/05 0700 ?In: 550 [I.V.:550] ?Out: -  ? ? ?Lab Results: ?Recent Labs  ?  09/24/21 ?2956 09/25/21 ?2130 09/26/21 ?0142  ?WBC 7.8 6.9 7.6  ?HGB 8.4* 8.7* 7.5*  ?HCT 26.9* 27.4* 23.5*  ?PLT 170 179 165  ? ?BMET ?Recent Labs  ?  09/24/21 ?8657 09/25/21 ?8469 09/26/21 ?0142  ?NA 140 142 142  ?K 3.7 3.4* 4.2  ?CL 104 106 108  ?CO2 '28 26 27  '$ ?GLUCOSE 91 100* 99  ?BUN '12 8 9   '$ ?CREATININE 1.04* 1.02* 1.06*  ?CALCIUM 8.8* 9.2 9.1  ? ?LFT ?Recent Labs  ?  09/26/21 ?0142  ?PROT 7.0  ?ALBUMIN 4.0  ?AST 44*  ?ALT 22  ?ALKPHOS 105  ?BILITOT 1.0  ? ? Assessment / Plan:   ?Assessment: ?1.  IDA: Status post EGD and colonoscopy yesterday with mild portal gastropathy and varices likely contributing to iron deficiency anemia ?2.  Newly diagnosed cirrhosis: Thought likely due to underlying Theresa Moran, serologic work-up done negative ?3.  Multifocal pneumonia ?4.  Dysphagia: EGD yesterday with no overt stricture ? ?Plan: ?1.  Continue Aldactone and Lasix ?2.  We will need to continue monitoring hemoglobin and transfusion as needed less than 7 ?3.  Patient asked about getting her MRI of the liver done while in the hospital, we will go ahead and order this today ?4.  Again as per Dr. Havery Moros would ideally start Nadolol or Propranolol at some point in time for varices and portal hypertension if able ? ?We will sign off.  Please let Theresa Moran know if we can be of any further assistance. ? ? LOS: 5 days  ? ?Lavone Nian Tashika Goodin  09/26/2021, 11:17 AM ? ?  ?

## 2021-09-26 NOTE — Progress Notes (Signed)
?                                  PROGRESS NOTE                                             ?                                                                                                                     ?                                         ? ? Patient Demographics:  ? ? Theresa Moran, is a 61 y.o. female, DOB - 03-19-1961, VHQ:[id number] ? ?Outpatient Primary MD for the patient is Tawnya Crook, MD    LOS - 5  Admit date - 09/21/2021   ? ?Chief Complaint  ?Patient presents with  ? Shortness of Breath  ?  BIBA from home, past few weeks Hgb and Hct have been low. Had X2 blood transfusions at [facility], pt states scheduled for blood transfusion on Monday. Productive cough X 3 days  ?    ? ?Brief Narrative (HPI from H&P)  61 y.o. female with medical history significant of neuropathy, anxiety/depression, hemolytic anemia, and thrombocytopenia who presents with complaints of shortness of breath.  She notes that everything is happened over the last 3 weeks. Patient has been being worked up in outpatient setting by oncology in regards to thrombocytopenia and anemia  Underwent bone marrow biopsy on 4/24 that noted hypercellular marrow with erythroid hyperplasia.  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  Patient had been referred to GI and is scheduled for EGD on 5/16. she has flow cytometry and other studies still pending.  6 days ago, she was transfused 2 units of packed red blood cells due to hemoglobin dropping down to 6.8 g/dL. ? ?Presented to the hospital with shortness of breath she was diagnosed with community-acquired pneumonia along with fluid overload and admitted to the hospital. ? ? ? Subjective:  ? ?Patient in bed, she denies any complaints today, reports dyspnea and cough has significantly improved, no nausea,  no vomiting. ? ? Assessment  & Plan :  ? ?CAP - with productive cough and right-sided infiltrate, monitor cultures has been placed on appropriate empiric antibiotics which will be continued, since infiltrate is predominantly on the right side we have involved speech as well.  Continue to monitor, improving clinically. ? ?Morbid obesity with recent suspicion of hepatic cirrhosis on outpatient imaging with suspicion of NASH.  Question if  she has underlying NASH ?-Input greatly appreciated, plan for endoscopy/colonoscopy tomorrow. ?- right upper quadrant ultrasound confirms cirrhosis, ?-Acute hepatitis panel unremarkable. ?-Blood pressure remains soft, continue with Midodrin, did add albumin as well.  Blood pressure has improved ?-On Lasix and Aldactone.  Blood pressure is soft, heart rate is elevated despite starting Coreg, she appears to be dry currently so I have decreased her Aldactone to 50 mg and Lasix to 40 mg oral daily. ?-Post endoscopy and colonoscopy 5/4, with evidence of early varices, she was started on low-dose Coreg, tolerating so far, uptitrate if able, not able to tolerate Coreg then she will need banding of her varices.  . ?-MRI liver is pending.   ? ?Recent diagnosis of hemolytic anemia, chronic thrombocytopenia.  Iron deficiency anemia ?-  under the care of oncologist Dr.Iruku, outpatient work-up being done she has undergone bone marrow biopsy with final results pending, oncology team is following. ?-This post endoscopy, colonoscopy on 5/5.  Evidence of varices, but no evidence of active bleed, hemoglobin this morning 7.5, continue to monitor closely. ?-We will hold subcu heparin. ? ?Anxiety and depression.  Continue home regimen which includes BuSpar and nortriptyline. ? ?Fluid overload.  Stable echocardiogram,, volume status significantly improved with current diuresis, actually I have decreased her Lasix and Aldactone, please see above discussion. . ? ?Persistent tachycardia, CTA stable,  echocardiogram along with TSH nonacute and stable,  question if her hemolytic anemia is causing a high output state with underlying tachycardia. ? ?Obesity with BMI of 49 ? ?   ? ?Condition - Fair ? ?Family Communication  :  None present ? ?Code Status :  Full ? ?Consults  : GI, oncology ? ?PUD Prophylaxis : PPI ? ? Procedures  :    ? ?TTE - 1. Left ventricular ejection fraction, by estimation, is 60 to 65%. The left ventricle has normal function. The left ventricle has no regional wall motion abnormalities. Left ventricular diastolic parameters were normal.  2. Right ventricular systolic function is normal. The right ventricular size is normal. There is moderately elevated pulmonary artery systolic pressure.  3. Left atrial size was mildly dilated.  4. The mitral valve is grossly normal. Mild mitral valve regurgitation. No evidence of mitral stenosis.  5. The aortic valve is grossly normal. There is mild calcification of the aortic valve. There is mild thickening of the aortic valve. Aortic valve regurgitation is not visualized. Aortic valve sclerosis/calcification is present, without any evidence of aortic stenosis.  6. The inferior vena cava is dilated in size with >50% respiratory variability, suggesting right atrial pressure of 8 mmHg. ? ?RUQ [place] - Status post cholecystectomy. Findings consistent with hepatic cirrhosis. No definite focal sonographic hepatic abnormality is noted, although imaging is somewhat limited due to body habitus. ? ?CTA -  1. Vascular contrast bolus had to be repeated. No pulmonary embolus identified. 2. Lower lung volumes from 10 days ago with small new layering pleural effusions and new with multifocal bilateral lower lobe and right middle lobe opacity most resembling Multifocal Pneumonia. 3. Cirrhosis.  Calcified coronary artery atherosclerosis ? ?   ? ?Disposition Plan  :   ? ?Status is: Inpatient ? ?DVT Prophylaxis  : We will hold subcu heparin given her anemia, she is compliant with  SCDs and ambulatory. ? ?Place TED hose Start: 09/22/21 0555 ?SCDs Start: 09/21/21 0955 ?  ? ?Lab Results  ?Component Value Date  ? PLT 165 09/26/2021  ? ? ?Diet :  ?Diet Order   ? ?       ?  Diet 2 gram sodium Room service appropriate? Yes; Fluid consistency: Thin  Diet effective now       ?  ? ?  ?  ? ?  ?  ? ?Inpatient Medications ? ?Scheduled Meds: ? baclofen  10 mg Oral BID  ? bisacodyl  20 mg Oral Once  ? busPIRone  10 mg Oral BID  ? carvedilol  3.125 mg Oral BID WC  ? furosemide  40 mg Oral Daily  ? gabapentin  600 mg Oral BID  ? guaiFENesin  600 mg Oral BID  ? midodrine  5 mg Oral TID WC  ? nortriptyline  100 mg Oral QHS  ? pantoprazole  40 mg Oral Daily  ? potassium chloride  40 mEq Oral Daily  ? sodium chloride flush  3 mL Intravenous Q12H  ? spironolactone  50 mg Oral Daily  ? tapentadol  50 mg Oral Daily  ? ?Continuous Infusions: ? ? ?PRN Meds:.acetaminophen **OR** acetaminophen, albuterol, oxyCODONE **OR** oxyCODONE ? ? ? ?Phillips Climes M.D on 09/26/2021 at 4:05 PM ? ?To page go to [email or web address]  ? ?[facility] -  Office  [phone] ? ?See all Orders from today for further details ? ? ? Objective:  ? ?Vitals:  ? 09/26/21 0000 09/26/21 0755 09/26/21 1203 09/26/21 1553  ?BP: 111/72 (!) 107/49 (!) 111/56 (!) 98/50  ?Pulse: (!) 104 (!) 104 99 100  ?Resp: '16 16 11 18  ' ?Temp: 98.3 ?F (36.8 ?C) 97.7 ?F (36.5 ?C) 98.2 ?F (36.8 ?C) 98.1 ?F (36.7 ?C)  ?TempSrc: Oral Oral Oral Oral  ?SpO2: 94% 92% 97% 96%  ?Weight:      ?Height:      ? ? ?Wt Readings from Last 3 Encounters:  ?09/25/21 95.3 kg  ?09/19/21 (!) 147 kg  ?09/18/21 (!) 147 kg  ? ? ?No intake or output data in the 24 hours ending 09/26/21 1605 ? ? ?Physical Exam ? ?Awake Alert, Oriented X 3, No new F.N deficits, Normal affect ?Symmetrical Chest wall movement, Good air movement bilaterally, CTAB ?RRR,No Gallops,Rubs or new Murmurs, No Parasternal Heave ?+ve B.Sounds, Abd Soft, No tenderness, No rebound - guarding or rigidity. ?No Cyanosis,  Clubbing or edema, No new Rash or bruise   ? ? ? ? Data Review:  ? ? ?CBC ?Recent Labs  ?Lab 09/21/21 ?0200 09/22/21 ?0206 09/23/21 ?0154 09/24/21 ?8110 09/25/21 ?3159 09/26/21 ?0142  ?WBC 10.4 7.6 8.0 7.8 6.9 7.6

## 2021-09-26 NOTE — Plan of Care (Signed)

## 2021-09-26 NOTE — Progress Notes (Signed)
No complaints of pain have been made this shift. Midodrine PO administered for blood pressure management. Patient up to chair with 1 person standby assistance. Patient is status post EGD and colonoscopy complete on 5/4. See GI note for further details. MRI of abdomen ordered by MD today. Patient is Aox4. VSS.  ? ?Theresa Moran is a 61 y.o. female patient. ?1. Community acquired pneumonia of right lower lobe of lung   ?2. Tachycardia   ?3. Other iron deficiency anemia   ? ?Past Medical History:  ?Diagnosis Date  ? Allergy   ? Anxiety   ? Asthma   ? in the past  ? Chronic pain   ? Depression   ? Gallstones   ? GERD (gastroesophageal reflux disease)   ? Headache   ? stopped after menopause  ? Neuromuscular disorder ([place])   ? Urticaria   ? ?Current Facility-Administered Medications  ?Medication Dose Route Frequency Provider Last Rate Last Admin  ? acetaminophen (TYLENOL) tablet 650 mg  650 mg Oral Q6H PRN Norval Morton, MD      ? Or  ? acetaminophen (TYLENOL) suppository 650 mg  650 mg Rectal Q6H PRN Fuller Plan A, MD      ? albuterol (PROVENTIL) (2.5 MG/3ML) 0.083% nebulizer solution 2.5 mg  2.5 mg Nebulization Q2H PRN Fuller Plan A, MD      ? baclofen (LIORESAL) tablet 10 mg  10 mg Oral BID Fuller Plan A, MD   10 mg at 09/26/21 0859  ? bisacodyl (DULCOLAX) EC tablet 20 mg  20 mg Oral Once Vena Rua, PA-C      ? busPIRone (BUSPAR) tablet 10 mg  10 mg Oral BID Fuller Plan A, MD   10 mg at 09/26/21 0858  ? carvedilol (COREG) tablet 3.125 mg  3.125 mg Oral BID WC Elgergawy, Silver Huguenin, MD   3.125 mg at 09/26/21 0858  ? furosemide (LASIX) tablet 40 mg  40 mg Oral Daily Elgergawy, Silver Huguenin, MD   40 mg at 09/26/21 0900  ? gabapentin (NEURONTIN) tablet 600 mg  600 mg Oral BID Fuller Plan A, MD   600 mg at 09/26/21 0900  ? guaiFENesin (MUCINEX) 12 hr tablet 600 mg  600 mg Oral BID Tamala Julian, Rondell A, MD   600 mg at 09/26/21 0900  ? midodrine (PROAMATINE) tablet 5 mg  5 mg Oral TID WC Thurnell Lose, MD    5 mg at 09/26/21 1151  ? nortriptyline (PAMELOR) capsule 100 mg  100 mg Oral QHS Fuller Plan A, MD   100 mg at 09/25/21 2131  ? oxyCODONE (Oxy IR/ROXICODONE) immediate release tablet 5 mg  5 mg Oral Once PRN Lynda Rainwater, MD      ? Or  ? oxyCODONE (ROXICODONE) 5 MG/5ML solution 5 mg  5 mg Oral Once PRN Lynda Rainwater, MD      ? pantoprazole (PROTONIX) EC tablet 40 mg  40 mg Oral Daily Thurnell Lose, MD   40 mg at 09/26/21 0857  ? potassium chloride SA (KLOR-CON M) CR tablet 40 mEq  40 mEq Oral Daily Thurnell Lose, MD   40 mEq at 09/26/21 0900  ? sodium chloride flush (NS) 0.9 % injection 3 mL  3 mL Intravenous Q12H Smith, Rondell A, MD   3 mL at 09/26/21 7673  ? spironolactone (ALDACTONE) tablet 50 mg  50 mg Oral Daily Elgergawy, Silver Huguenin, MD   50 mg at 09/26/21 0859  ? tapentadol (NUCYNTA) tablet  50 mg  50 mg Oral Daily Pham, Minh Q, RPH-CPP   50 mg at 09/25/21 2001  ? ?Allergies  ?Allergen Reactions  ? Shellfish Allergy Anaphylaxis, Hives, Itching, Shortness Of Breath and Swelling  ? Shellfish-Derived Products   ? ?Principal Problem: ?  CAP (community acquired pneumonia) ?Active Problems: ?  Depressive disorder ?  SIRS (systemic inflammatory response syndrome) (HCC) ?  Absolute anemia ?  Thrombocytopenia ([place]) ?  Morbid obesity with BMI of 50.0-59.9, adult ([place]) ?  Cirrhosis of liver ([place]) ?  Bilateral lower extremity edema ?  Tachycardia ?  Dysphagia ?  Gastritis and gastroduodenitis ?  Benign neoplasm of colon ? ?Blood pressure (!) 111/56, pulse 99, temperature 98.2 ?F (36.8 ?C), temperature source Oral, resp. rate 11, height '5\' 6"'$  (1.676 m), weight 95.3 kg, SpO2 97 %. ? ?Subjective ?Objective: ?Vital signs: (most recent): Blood pressure (!) 111/56, pulse 99, temperature 98.2 ?F (36.8 ?C), temperature source Oral, resp. rate 11, height '5\' 6"'$  (1.676 m), weight 95.3 kg, SpO2 97 %.   ?Assessment & Plan ? ?Theresa Moran A Gwyn Mehring ?09/26/2021 ? ? ?

## 2021-09-27 DIAGNOSIS — N179 Acute kidney failure, unspecified: Secondary | ICD-10-CM

## 2021-09-27 DIAGNOSIS — J189 Pneumonia, unspecified organism: Secondary | ICD-10-CM | POA: Diagnosis not present

## 2021-09-27 DIAGNOSIS — K7460 Unspecified cirrhosis of liver: Secondary | ICD-10-CM | POA: Diagnosis not present

## 2021-09-27 LAB — CBC WITH DIFFERENTIAL/PLATELET
Abs Immature Granulocytes: 0.08 10*3/uL — ABNORMAL HIGH (ref 0.00–0.07)
Basophils Absolute: 0.1 10*3/uL (ref 0.0–0.1)
Basophils Relative: 1 %
Eosinophils Absolute: 0.3 10*3/uL (ref 0.0–0.5)
Eosinophils Relative: 4 %
HCT: 26.1 % — ABNORMAL LOW (ref 36.0–46.0)
Hemoglobin: 8.3 g/dL — ABNORMAL LOW (ref 12.0–15.0)
Immature Granulocytes: 1 %
Lymphocytes Relative: 32 %
Lymphs Abs: 2.7 10*3/uL (ref 0.7–4.0)
MCH: 30.1 pg (ref 26.0–34.0)
MCHC: 31.8 g/dL (ref 30.0–36.0)
MCV: 94.6 fL (ref 80.0–100.0)
Monocytes Absolute: 0.8 10*3/uL (ref 0.1–1.0)
Monocytes Relative: 9 %
Neutro Abs: 4.5 10*3/uL (ref 1.7–7.7)
Neutrophils Relative %: 53 %
Platelets: 179 10*3/uL (ref 150–400)
RBC: 2.76 MIL/uL — ABNORMAL LOW (ref 3.87–5.11)
RDW: 17.3 % — ABNORMAL HIGH (ref 11.5–15.5)
WBC: 8.4 10*3/uL (ref 4.0–10.5)
nRBC: 0 % (ref 0.0–0.2)

## 2021-09-27 LAB — MITOCHONDRIAL ANTIBODIES: Mitochondrial M2 Ab, IgG: 31.2 U — ABNORMAL HIGH (ref <=20.0)

## 2021-09-27 LAB — COMPREHENSIVE METABOLIC PANEL
ALT: 24 U/L (ref 0–44)
AST: 49 U/L — ABNORMAL HIGH (ref 15–41)
Albumin: 3.9 g/dL (ref 3.5–5.0)
Alkaline Phosphatase: 93 U/L (ref 38–126)
Anion gap: 8 (ref 5–15)
BUN: 11 mg/dL (ref 8–23)
CO2: 28 mmol/L (ref 22–32)
Calcium: 9.4 mg/dL (ref 8.9–10.3)
Chloride: 102 mmol/L (ref 98–111)
Creatinine, Ser: 1.24 mg/dL — ABNORMAL HIGH (ref 0.44–1.00)
GFR, Estimated: 49 mL/min — ABNORMAL LOW (ref >60)
Glucose, Bld: 123 mg/dL — ABNORMAL HIGH (ref 70–99)
Potassium: 3.8 mmol/L (ref 3.5–5.1)
Sodium: 138 mmol/L (ref 135–145)
Total Bilirubin: 0.8 mg/dL (ref 0.3–1.2)
Total Protein: 6.9 g/dL (ref 6.5–8.1)

## 2021-09-27 LAB — HEPATITIS B SURFACE ANTIGEN: Hepatitis B Surface Ag: NONREACTIVE

## 2021-09-27 LAB — IGM: IgM, Serum: 117 mg/dL (ref 50–300)

## 2021-09-27 LAB — HEPATITIS B CORE ANTIBODY, TOTAL: Hep B Core Total Ab: NONREACTIVE

## 2021-09-27 LAB — ANA: Anti Nuclear Antibody (ANA): NEGATIVE

## 2021-09-27 LAB — ANTI-SMOOTH MUSCLE ANTIBODY, IGG: Actin (Smooth Muscle) Antibody (IGG): <20 U (ref <20)

## 2021-09-27 LAB — IGG: IgG (Immunoglobin G), Serum: 1392 mg/dL (ref 600–1540)

## 2021-09-27 MED ORDER — ALBUMIN HUMAN 25 % IV SOLN
25.0000 g | Freq: Four times a day (QID) | INTRAVENOUS | Status: AC
  Administered 2021-09-27 – 2021-09-28 (×4): 25 g via INTRAVENOUS
  Filled 2021-09-27 (×4): qty 100

## 2021-09-27 MED ORDER — CARVEDILOL 3.125 MG PO TABS: 3.1250 mg | ORAL_TABLET | Freq: Two times a day (BID) | ORAL | Status: DC

## 2021-09-27 NOTE — Progress Notes (Signed)
?                                  PROGRESS NOTE                                             ?                                                                                                                     ?                                         ? ? Patient Demographics:  ? ? Theresa Moran, is a 61 y.o. female, DOB - [birth year], IEP:[id number] ? ?Outpatient Primary MD for the patient is Tawnya Crook, MD    LOS - 6  Admit date - 09/21/2021   ? ?Chief Complaint  ?Patient presents with  ? Shortness of Breath  ?  BIBA from home, past few weeks Hgb and Hct have been low. Had X2 blood transfusions at [facility], pt states scheduled for blood transfusion on Monday. Productive cough X 3 days  ?    ? ?Brief Narrative (HPI from H&P)  61 y.o. female with medical history significant of neuropathy, anxiety/depression, hemolytic anemia, and thrombocytopenia who presents with complaints of shortness of breath.  She notes that everything is happened over the last 3 weeks. Patient has been being worked up in outpatient setting by oncology in regards to thrombocytopenia and anemia  Underwent bone marrow biopsy on 4/24 that noted hypercellular marrow with erythroid hyperplasia.  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  Patient had been referred to GI and is scheduled for EGD on 5/16. she has flow cytometry and other studies still pending.  6 days ago, she was transfused 2 units of packed red blood cells due to hemoglobin dropping down to 6.8 g/dL. ? ?Presented to the hospital with shortness of breath she was diagnosed with community-acquired pneumonia along with fluid overload and admitted to the hospital. ? ? ? Subjective:  ? ?Patient in bed, she denies any complaints today, reports dyspnea and cough has significantly improved, no nausea,  no vomiting. ? ? Assessment  & Plan :  ? ?CAP - with productive cough and right-sided infiltrate, has been placed on appropriate empiric antibiotics which will be continued, since infiltrate is predominantly on the right side we have involved speech as well.  Continue to monitor, improving clinically. ? ?Morbid obesity with recent suspicion of hepatic cirrhosis on outpatient imaging with suspicion of NASH.  Question if she has  underlying NASH ?-Input greatly appreciated, plan for endoscopy/colonoscopy tomorrow. ?- right upper quadrant ultrasound confirms cirrhosis, ?-Acute hepatitis panel unremarkable. ?-Volume status significantly improved currently on Aldactone and Lasix, but creatinine is trending up, blood pressure remains soft, so diuresis has been held currently, will reassess tomorrow if it needs to be resumed at a lower dose before discharge. ?-Continue with midodrine to assist with blood pressure, will resume back on albumin today specially in setting of worsening renal function. ?-Due to blockers has been discontinued given soft blood pressure. ?-MRI liver significant for cirrhosis, portal hypertension, but no liver mass.. ?-Post endoscopy and colonoscopy 5/4, with evidence of early varices, she was started on low-dose Coreg, tolerating so far, uptitrate if able, not able to tolerate Coreg then she will need banding of her varices.  . ? ?AKI ?-Likely due to diuresis, soft blood pressure, hold Coreg, Aldactone and Lasix, will start on albumin, continue with midodrine and avoid hypotension. ? ?Recent diagnosis of hemolytic anemia, chronic thrombocytopenia.  Iron deficiency anemia ?-  under the care of oncologist Dr.Iruku, outpatient work-up being done she has undergone bone marrow biopsy with final results pending, oncology team is following. ?-This post endoscopy, colonoscopy on 5/5.  Evidence of varices, but no evidence of active bleed. ?-We will hold subcu heparin. ? ?Anxiety and depression.  Continue  home regimen which includes BuSpar and nortriptyline. ? ?Fluid overload.  Stable echocardiogram,, volume status significantly improved with current diuresis, actually I have decreased her Lasix and Aldactone, please see above discussion. . ? ?Persistent tachycardia, CTA stable, echocardiogram along with TSH nonacute and stable,  question if her hemolytic anemia is causing a high output state with underlying tachycardia. ? ?Obesity with BMI of 49 ? ?   ? ?Condition - Fair ? ?Family Communication  :  None present ? ?Code Status :  Full ? ?Consults  : GI, oncology ? ?PUD Prophylaxis : PPI ? ? Procedures  :    ? ?TTE - 1. Left ventricular ejection fraction, by estimation, is 60 to 65%. The left ventricle has normal function. The left ventricle has no regional wall motion abnormalities. Left ventricular diastolic parameters were normal.  2. Right ventricular systolic function is normal. The right ventricular size is normal. There is moderately elevated pulmonary artery systolic pressure.  3. Left atrial size was mildly dilated.  4. The mitral valve is grossly normal. Mild mitral valve regurgitation. No evidence of mitral stenosis.  5. The aortic valve is grossly normal. There is mild calcification of the aortic valve. There is mild thickening of the aortic valve. Aortic valve regurgitation is not visualized. Aortic valve sclerosis/calcification is present, without any evidence of aortic stenosis.  6. The inferior vena cava is dilated in size with >50% respiratory variability, suggesting right atrial pressure of 8 mmHg. ? ?RUQ [place] - Status post cholecystectomy. Findings consistent with hepatic cirrhosis. No definite focal sonographic hepatic abnormality is noted, although imaging is somewhat limited due to body habitus. ? ?CTA -  1. Vascular contrast bolus had to be repeated. No pulmonary embolus identified. 2. Lower lung volumes from 10 days ago with small new layering pleural effusions and new with multifocal bilateral  lower lobe and right middle lobe opacity most resembling Multifocal Pneumonia. 3. Cirrhosis.  Calcified coronary artery atherosclerosis ? ?   ? ?Disposition Plan  :   ? ?Status is: Inpatient ? ?DVT Prophylaxis  : We will hold subcu heparin given her anemia, she is compliant with SCDs and ambulatory. ? ?Place TED  hose Start: 09/22/21 0555 ?SCDs Start: 09/21/21 0955 ?  ? ?Lab Results  ?Component Value Date  ? PLT 179 09/27/2021  ? ? ?Diet :  ?Diet Order   ? ?       ?  Diet 2 gram sodium Room service appropriate? Yes; Fluid consistency: Thin  Diet effective now       ?  ? ?  ?  ? ?  ?  ? ?Inpatient Medications ? ?Scheduled Meds: ? baclofen  10 mg Oral BID  ? bisacodyl  20 mg Oral Once  ? busPIRone  10 mg Oral BID  ? gabapentin  600 mg Oral BID  ? guaiFENesin  600 mg Oral BID  ? midodrine  5 mg Oral TID WC  ? nortriptyline  100 mg Oral QHS  ? pantoprazole  40 mg Oral Daily  ? potassium chloride  20 mEq Oral Daily  ? sodium chloride flush  3 mL Intravenous Q12H  ? tapentadol  50 mg Oral Daily  ? ?Continuous Infusions: ? albumin human 25 g (09/27/21 0900)  ? ? ?PRN Meds:.acetaminophen **OR** acetaminophen, albuterol, oxyCODONE **OR** oxyCODONE ? ? ? ?Phillips Climes M.D on 09/27/2021 at 12:48 PM ? ?To page go to [email or web address]  ? ?[facility] -  Office  [phone] ? ?See all Orders from today for further details ? ? ? Objective:  ? ?Vitals:  ? 09/27/21 0000 09/27/21 0309 09/27/21 9021 09/27/21 1155  ?BP: 115/60 104/80  117/74  ?Pulse: 90 90  99  ?Resp: '18 18  20  ' ?Temp: 97.9 ?F (36.6 ?C) 98.4 ?F (36.9 ?C)  98.9 ?F (37.2 ?C)  ?TempSrc: Oral Oral  Oral  ?SpO2: 95% 91%  90%  ?Weight:   96 kg   ?Height:      ? ? ?Wt Readings from Last 3 Encounters:  ?09/27/21 96 kg  ?09/19/21 (!) 147 kg  ?09/18/21 (!) 147 kg  ? ? ? ?Intake/Output Summary (Last 24 hours) at 09/27/2021 1248 ?Last data filed at 09/27/2021 1033 ?Gross per 24 hour  ?Intake 600 ml  ?Output --  ?Net 600 ml  ? ? ? ?Physical Exam ? ?Awake Alert, Oriented X 3, No  new F.N deficits, Normal affect ?Symmetrical Chest wall movement, Good air movement bilaterally, CTAB ?RRR,No Gallops,Rubs or new Murmurs, No Parasternal Heave ?+ve B.Sounds, Abd Soft, No tenderness, No re

## 2021-09-28 DIAGNOSIS — J189 Pneumonia, unspecified organism: Secondary | ICD-10-CM | POA: Diagnosis not present

## 2021-09-28 DIAGNOSIS — D599 Acquired hemolytic anemia, unspecified: Secondary | ICD-10-CM | POA: Diagnosis not present

## 2021-09-28 DIAGNOSIS — K7460 Unspecified cirrhosis of liver: Secondary | ICD-10-CM | POA: Diagnosis not present

## 2021-09-28 LAB — BASIC METABOLIC PANEL
Anion gap: 9 (ref 5–15)
BUN: 11 mg/dL (ref 8–23)
CO2: 28 mmol/L (ref 22–32)
Calcium: 9.9 mg/dL (ref 8.9–10.3)
Chloride: 104 mmol/L (ref 98–111)
Creatinine, Ser: 0.94 mg/dL (ref 0.44–1.00)
GFR, Estimated: >60 mL/min (ref >60)
Glucose, Bld: 104 mg/dL — ABNORMAL HIGH (ref 70–99)
Potassium: 4.1 mmol/L (ref 3.5–5.1)
Sodium: 141 mmol/L (ref 135–145)

## 2021-09-28 LAB — CBC
HCT: 25.5 % — ABNORMAL LOW (ref 36.0–46.0)
Hemoglobin: 8.1 g/dL — ABNORMAL LOW (ref 12.0–15.0)
MCH: 29.8 pg (ref 26.0–34.0)
MCHC: 31.8 g/dL (ref 30.0–36.0)
MCV: 93.8 fL (ref 80.0–100.0)
Platelets: 166 10*3/uL (ref 150–400)
RBC: 2.72 MIL/uL — ABNORMAL LOW (ref 3.87–5.11)
RDW: 17.2 % — ABNORMAL HIGH (ref 11.5–15.5)
WBC: 7.4 10*3/uL (ref 4.0–10.5)
nRBC: 0 % (ref 0.0–0.2)

## 2021-09-28 MED ORDER — CARVEDILOL 3.125 MG PO TABS
3.1250 mg | ORAL_TABLET | Freq: Two times a day (BID) | ORAL | Status: DC
Start: 2021-09-28 — End: 2021-09-28
  Administered 2021-09-28: 3.125 mg via ORAL
  Filled 2021-09-28: qty 1

## 2021-09-28 MED ORDER — FERROUS SULFATE 325 (65 FE) MG PO TBEC: 325.0000 mg | DELAYED_RELEASE_TABLET | Freq: Two times a day (BID) | ORAL | 2 refills | Status: DC

## 2021-09-28 MED ORDER — FUROSEMIDE 20 MG PO TABS: 20.0000 mg | ORAL_TABLET | Freq: Every day | ORAL | 0 refills | Status: DC

## 2021-09-28 MED ORDER — CARVEDILOL 3.125 MG PO TABS: 3.1250 mg | ORAL_TABLET | Freq: Two times a day (BID) | ORAL | 0 refills | Status: DC

## 2021-09-28 MED ORDER — SPIRONOLACTONE 25 MG PO TABS
25.0000 mg | ORAL_TABLET | Freq: Every day | ORAL | Status: DC
  Administered 2021-09-28: 25 mg via ORAL
  Filled 2021-09-28: qty 1

## 2021-09-28 MED ORDER — FUROSEMIDE 20 MG PO TABS
20.0000 mg | ORAL_TABLET | Freq: Every day | ORAL | Status: DC
Start: 2021-09-28 — End: 2021-09-28
  Administered 2021-09-28: 20 mg via ORAL
  Filled 2021-09-28: qty 1

## 2021-09-28 MED ORDER — PANTOPRAZOLE SODIUM 40 MG PO TBEC: 40.0000 mg | DELAYED_RELEASE_TABLET | Freq: Every day | ORAL | 3 refills | Status: DC

## 2021-09-28 MED ORDER — SPIRONOLACTONE 25 MG PO TABS: 25.0000 mg | ORAL_TABLET | Freq: Every day | ORAL | 0 refills | Status: DC

## 2021-09-28 MED ORDER — MIDODRINE HCL 5 MG PO TABS: 5.0000 mg | ORAL_TABLET | Freq: Three times a day (TID) | ORAL | 0 refills | Status: DC

## 2021-09-28 NOTE — Discharge Summary (Signed)
Physician Discharge Summary  ?MAECI KALBFLEISCH BSW:[id number] DOB: 02-Sep-1960 DOA: 09/21/2021 ? ?PCP: Tawnya Crook, MD ? ?Admit date: 09/21/2021 ?Discharge date: 09/28/2021 ? ?Admitted From: Home ?Disposition:  Home ? ?Recommendations for Outpatient Follow-up:  ?Follow up with PCP in 1-2 weeks ?Please obtain BMP/CBC in one week ?Please follow up on the following pending results: ? ?Home Health:NO ? ?Discharge Condition:Stable ?CODE STATUS:FULL ?Diet recommendation: Heart Healthy / 2 dm sodium Diet ? ?Brief/Interim Summary: ? ? 61 y.o. female with medical history significant of neuropathy, anxiety/depression, hemolytic anemia, and thrombocytopenia who presents with complaints of shortness of breath.  She notes that everything is happened over the last 3 weeks. Patient has been being worked up in outpatient setting by oncology in regards to thrombocytopenia and anemia  Underwent bone marrow biopsy on 4/24 that noted hypercellular marrow with erythroid hyperplasia.  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  Patient had been referred to GI and is scheduled for EGD on 5/16. she has flow cytometry and other studies still pending.  6 days ago, she was transfused 2 units of packed red blood cells due to hemoglobin dropping down to 6.8 g/dL. ?- Presented to the hospital with shortness of breath she was diagnosed with community-acquired pneumonia along with fluid overload and admitted to the hospital. ?  ?  ?CAP  ?- with productive cough and right-sided infiltrate, has been placed on appropriate empiric antibiotics which will be continued, since infiltrate is predominantly on the right side we have involved speech as well.  low risk for aspiration, finished antibiotic treatment.   ?  ?Liver cirrhosis with suspicion for NASH . ?Morbid obesity  ?- Question if she has  underlying NASH ?- right upper quadrant ultrasound confirms cirrhosis, ?-Acute hepatitis panel unremarkable. ?-GI were consulted, started Aldactone, Lasix, volume status has significantly improved, doses has been titrated during hospital stay given labile blood pressure and renal function, but overall blood pressure has stabilized on midodrine, and getting has normalized, . ?-She will be discharged on low-dose Lasix, Aldactone as blood pressure allows, as well with evidence of mild esophageal varices she was started on on low-dose Coreg as well.  . ?-MRI liver significant for cirrhosis, portal hypertension, but no liver mass.. ?-Post endoscopy and colonoscopy 5/4, with evidence of early varices, she was started on low-dose Coreg, if not tolerating then she will need banding of her varices, this can be done as an outpatient.   ? ?  ?AKI ?-Likely due to diuresis, soft blood pressure, retinae peaked at 1.24, she responded to midodrine, albumin, renal function within normal limit today.   ?  ?Recent diagnosis of hemolytic anemia,  ?- chronic thrombocytopenia.  ?-  Iron deficiency anemia ?-  under the care of oncologist Dr.Iruku, outpatient work-up being done she has undergone bone marrow biopsy with final results pending, oncology team is following. ?-This post endoscopy, colonoscopy on 5/5.  Evidence of varices, but no evidence of active bleed. ?-Started on iron supplements. ?  ?Anxiety and depression.  Continue home regimen which includes BuSpar and nortriptyline. ?  ?Fluid overload due to liver cirrhosis.  Stable echocardiogram,, volume status significantly improved with current diuresis,  please see above discussion. . ?  ?Persistent tachycardia, CTA stable, echocardiogram along with TSH nonacute and stable, she is started on low-dose beta-blockers. ? ?Obesity with BMI of 49 ? ?Discharge Diagnoses:  ?  Principal Problem: ?  CAP (community acquired pneumonia) ?Active Problems: ?  Depressive disorder ?  SIRS (systemic  inflammatory response syndrome) (HCC) ?  Absolute anemia ?  Thrombocytopenia ([place]) ?  Morbid obesity with BMI of 50.0-59.9, adult ([place]) ?  Cirrhosis of liver ([place]) ?  Bilateral lower extremity edema ?  Tachycardia ?  Dysphagia ?  Gastritis and gastroduodenitis ?  Benign neoplasm of colon ? ? ? ?Discharge Instructions ? ?Discharge Instructions   ? ? Diet - low sodium heart healthy   Complete by: As directed ?  ? Discharge instructions   Complete by: As directed ?  ? Follow with Primary MD Tawnya Crook, MD in 7 days  ? ?Get CBC, CMP,  checked  by Primary MD next visit.  ? ? ?Activity: As tolerated with Full fall precautions use walker/cane & assistance as needed ? ? ?Disposition Home  ? ? ?Diet: 2 g sodium diet ? ? ?On your next visit with your primary care physician please Get Medicines reviewed and adjusted. ? ? ?Please request your Prim.MD to go over all Hospital Tests and Procedure/Radiological results at the follow up, please get all Hospital records sent to your Prim MD by signing hospital release before you go home. ? ? ?If you experience worsening of your admission symptoms, develop shortness of breath, life threatening emergency, suicidal or homicidal thoughts you must seek medical attention immediately by calling 911 or calling your MD immediately  if symptoms less severe. ? ?You Must read complete instructions/literature along with all the possible adverse reactions/side effects for all the Medicines you take and that have been prescribed to you. Take any new Medicines after you have completely understood and accpet all the possible adverse reactions/side effects.  ? ?Do not drive, operating heavy machinery, perform activities at heights, swimming or participation in water activities or provide baby sitting services if your were admitted for syncope or siezures until you have seen by Primary MD or a Neurologist and advised to do so again. ? ?Do not drive when taking Pain medications.  ? ? ?Do not take  more than prescribed Pain, Sleep and Anxiety Medications ? ?Special Instructions: If you have smoked or chewed Tobacco  in the last 2 yrs please stop smoking, stop any regular Alcohol  and or any Recreational drug use. ? ?Wear Seat belts while driving. ? ? ?Please note ? ?You were cared for by a hospitalist during your hospital stay. If you have any questions about your discharge medications or the care you received while you were in the hospital after you are discharged, you can call the unit and asked to speak with the hospitalist on call if the hospitalist that took care of you is not available. Once you are discharged, your primary care physician will handle any further medical issues. Please note that NO REFILLS for any discharge medications will be authorized once you are discharged, as it is imperative that you return to your primary care physician (or establish a relationship with a primary care physician if you do not have one) for your aftercare needs so that they can reassess your need for medications and monitor your lab values.  ? Increase activity slowly   Complete by: As directed ?  ? ?  ? ?Allergies as of 09/28/2021   ? ?   Reactions  ? Shellfish Allergy Anaphylaxis, Hives, Itching, Shortness Of Breath, Swelling  ? Shellfish-derived Products   ? ?  ? ?  ?Medication List  ?  ? ?  STOP taking these medications   ? ?ibuprofen 600 MG tablet ?Commonly known as: ADVIL ?  ? ?  ? ?TAKE these medications   ? ?baclofen 10 MG tablet ?Commonly known as: LIORESAL ?Take 10 mg by mouth 2 (two) times daily. ?  ?busPIRone 10 MG tablet ?Commonly known as: BUSPAR ?Take 1 tablet (10 mg total) by mouth 2 (two) times daily. ?  ?carvedilol 3.125 MG tablet ?Commonly known as: COREG ?Take 1 tablet (3.125 mg total) by mouth 2 (two) times daily with a meal. ?  ?ferrous sulfate 325 (65 FE) MG EC tablet ?Take 1 tablet (325 mg total) by mouth 2 (two) times daily. ?  ?furosemide 20 MG tablet ?Commonly known as: LASIX ?Take 1 tablet  (20 mg total) by mouth daily. ?Start taking on: Sep 29, 2021 ?  ?gabapentin 600 MG tablet ?Commonly known as: NEURONTIN ?Take 600 mg by mouth 2 (two) times daily. ?  ?midodrine 5 MG tablet ?Commonly known a

## 2021-09-28 NOTE — Progress Notes (Signed)
Patient discharging home. Vital signs stable at time of discharge as reflected in discharge summary. Discharge instructions given and verbal understanding returned. No questions at this time. 

## 2021-09-28 NOTE — Progress Notes (Signed)
HOSPITAL MEDICINE OVERNIGHT EVENT NOTE   ? ?Notified by nursing that patient is exhibiting a yellow MEWS score. ? ?Chart reviewed, score seems to be yellow due to persisting tachycardia which has been ongoing.  Patient was treated for pneumonia during this hospitalization with a total of 5 days of intravenous ceftriaxone and azithromycin with antibiotic course completed on 5/4. ? ?Nursing reports no clinical change.  Patient has no new complaints. ? ?No need for change in plan of care at this time.  Continue to monitor. ? ?Theresa Emerald  MD ?[facility]  ? ? ? ? ? ? ? ? ? ? ?

## 2021-09-28 NOTE — Discharge Instructions (Signed)
Follow with Primary MD Tawnya Crook, MD in 7 days  ? ?Get CBC, CMP,  checked  by Primary MD next visit.  ? ? ?Activity: As tolerated with Full fall precautions use walker/cane & assistance as needed ? ? ?Disposition Home  ? ? ?Diet: 2 g sodium diet ? ? ?On your next visit with your primary care physician please Get Medicines reviewed and adjusted. ? ? ?Please request your Prim.MD to go over all Hospital Tests and Procedure/Radiological results at the follow up, please get all Hospital records sent to your Prim MD by signing hospital release before you go home. ? ? ?If you experience worsening of your admission symptoms, develop shortness of breath, life threatening emergency, suicidal or homicidal thoughts you must seek medical attention immediately by calling 911 or calling your MD immediately  if symptoms less severe. ? ?You Must read complete instructions/literature along with all the possible adverse reactions/side effects for all the Medicines you take and that have been prescribed to you. Take any new Medicines after you have completely understood and accpet all the possible adverse reactions/side effects.  ? ?Do not drive, operating heavy machinery, perform activities at heights, swimming or participation in water activities or provide baby sitting services if your were admitted for syncope or siezures until you have seen by Primary MD or a Neurologist and advised to do so again. ? ?Do not drive when taking Pain medications.  ? ? ?Do not take more than prescribed Pain, Sleep and Anxiety Medications ? ?Special Instructions: If you have smoked or chewed Tobacco  in the last 2 yrs please stop smoking, stop any regular Alcohol  and or any Recreational drug use. ? ?Wear Seat belts while driving. ? ? ?Please note ? ?You were cared for by a hospitalist during your hospital stay. If you have any questions about your discharge medications or the care you received while you were in the hospital after you are  discharged, you can call the unit and asked to speak with the hospitalist on call if the hospitalist that took care of you is not available. Once you are discharged, your primary care physician will handle any further medical issues. Please note that NO REFILLS for any discharge medications will be authorized once you are discharged, as it is imperative that you return to your primary care physician (or establish a relationship with a primary care physician if you do not have one) for your aftercare needs so that they can reassess your need for medications and monitor your lab values.  ?

## 2021-09-29 ENCOUNTER — Inpatient Hospital Stay: Payer: Medicare Other

## 2021-09-29 ENCOUNTER — Other Ambulatory Visit: Payer: Self-pay

## 2021-09-29 ENCOUNTER — Telehealth: Payer: Self-pay | Admitting: Hematology and Oncology

## 2021-09-29 ENCOUNTER — Inpatient Hospital Stay: Payer: Medicare Other | Attending: Hematology and Oncology | Admitting: Hematology and Oncology

## 2021-09-29 ENCOUNTER — Telehealth: Payer: Self-pay

## 2021-09-29 VITALS — BP 112/45 | HR 98 | Temp 97.9°F | Wt 297.2 lb

## 2021-09-29 DIAGNOSIS — D509 Iron deficiency anemia, unspecified: Secondary | ICD-10-CM | POA: Insufficient documentation

## 2021-09-29 DIAGNOSIS — D599 Acquired hemolytic anemia, unspecified: Secondary | ICD-10-CM | POA: Diagnosis not present

## 2021-09-29 DIAGNOSIS — K7460 Unspecified cirrhosis of liver: Secondary | ICD-10-CM

## 2021-09-29 DIAGNOSIS — K219 Gastro-esophageal reflux disease without esophagitis: Secondary | ICD-10-CM | POA: Diagnosis not present

## 2021-09-29 DIAGNOSIS — Z79899 Other long term (current) drug therapy: Secondary | ICD-10-CM | POA: Diagnosis not present

## 2021-09-29 DIAGNOSIS — D589 Hereditary hemolytic anemia, unspecified: Secondary | ICD-10-CM | POA: Diagnosis not present

## 2021-09-29 DIAGNOSIS — D696 Thrombocytopenia, unspecified: Secondary | ICD-10-CM

## 2021-09-29 DIAGNOSIS — G629 Polyneuropathy, unspecified: Secondary | ICD-10-CM | POA: Insufficient documentation

## 2021-09-29 DIAGNOSIS — J45909 Unspecified asthma, uncomplicated: Secondary | ICD-10-CM | POA: Insufficient documentation

## 2021-09-29 DIAGNOSIS — R000 Tachycardia, unspecified: Secondary | ICD-10-CM

## 2021-09-29 DIAGNOSIS — R933 Abnormal findings on diagnostic imaging of other parts of digestive tract: Secondary | ICD-10-CM

## 2021-09-29 HISTORY — DX: Iron deficiency anemia, unspecified: D50.9

## 2021-09-29 LAB — CBC WITH DIFFERENTIAL/PLATELET
Abs Immature Granulocytes: 0.07 10*3/uL (ref 0.00–0.07)
Basophils Absolute: 0.1 10*3/uL (ref 0.0–0.1)
Basophils Relative: 1 %
Eosinophils Absolute: 0.3 10*3/uL (ref 0.0–0.5)
Eosinophils Relative: 3 %
HCT: 30.6 % — ABNORMAL LOW (ref 36.0–46.0)
Hemoglobin: 9.8 g/dL — ABNORMAL LOW (ref 12.0–15.0)
Immature Granulocytes: 1 %
Lymphocytes Relative: 25 %
Lymphs Abs: 2.2 10*3/uL (ref 0.7–4.0)
MCH: 30.2 pg (ref 26.0–34.0)
MCHC: 32 g/dL (ref 30.0–36.0)
MCV: 94.2 fL (ref 80.0–100.0)
Monocytes Absolute: 0.8 10*3/uL (ref 0.1–1.0)
Monocytes Relative: 9 %
Neutro Abs: 5.6 10*3/uL (ref 1.7–7.7)
Neutrophils Relative %: 61 %
Platelets: 177 10*3/uL (ref 150–400)
RBC: 3.25 MIL/uL — ABNORMAL LOW (ref 3.87–5.11)
RDW: 17.8 % — ABNORMAL HIGH (ref 11.5–15.5)
WBC: 9 10*3/uL (ref 4.0–10.5)
nRBC: 0 % (ref 0.0–0.2)

## 2021-09-29 LAB — NASH FIBROSURE
ALPHA 2-MACROGLOBULINS, QN: 375 mg/dL — ABNORMAL HIGH (ref 110–276)
ALT (SGPT) P5P: 32 IU/L (ref 0–40)
AST (SGOT) P5P: 87 IU/L — ABNORMAL HIGH (ref 0–40)
Apolipoprotein A-1: 167 mg/dL (ref 116–209)
Bilirubin, Total: 0.2 mg/dL (ref 0.0–1.2)
Cholesterol, Total: 219 mg/dL — ABNORMAL HIGH (ref 100–199)
Fibrosis Score: 0.38 — ABNORMAL HIGH (ref 0.00–0.21)
GGT: 39 IU/L (ref 0–60)
Glucose: 138 mg/dL — ABNORMAL HIGH (ref 70–99)
Haptoglobin: 79 mg/dL (ref 37–355)
Height: 65 in
NASH Score: 0.75 — ABNORMAL HIGH
Steatosis Score: 0.53 — ABNORMAL HIGH (ref 0.00–0.30)
Triglycerides: 122 mg/dL (ref 0–149)
Weight: 324 [lb_av]

## 2021-09-29 LAB — COMPREHENSIVE METABOLIC PANEL
ALT: 20 U/L (ref 0–44)
AST: 46 U/L — ABNORMAL HIGH (ref 15–41)
Albumin: 4.8 g/dL (ref 3.5–5.0)
Alkaline Phosphatase: 111 U/L (ref 38–126)
Anion gap: 6 (ref 5–15)
BUN: 16 mg/dL (ref 8–23)
CO2: 30 mmol/L (ref 22–32)
Calcium: 10.2 mg/dL (ref 8.9–10.3)
Chloride: 106 mmol/L (ref 98–111)
Creatinine, Ser: 1.03 mg/dL — ABNORMAL HIGH (ref 0.44–1.00)
GFR, Estimated: >60 mL/min (ref >60)
Glucose, Bld: 114 mg/dL — ABNORMAL HIGH (ref 70–99)
Potassium: 4.5 mmol/L (ref 3.5–5.1)
Sodium: 142 mmol/L (ref 135–145)
Total Bilirubin: 1.1 mg/dL (ref 0.3–1.2)
Total Protein: 8.4 g/dL — ABNORMAL HIGH (ref 6.5–8.1)

## 2021-09-29 LAB — STATUS REPORT

## 2021-09-29 LAB — RETICULOCYTES
Immature Retic Fract: 24.3 % — ABNORMAL HIGH (ref 2.3–15.9)
RBC.: 3.22 MIL/uL — ABNORMAL LOW (ref 3.87–5.11)
Retic Count, Absolute: 170 10*3/uL (ref 19.0–186.0)
Retic Ct Pct: 5.3 % — ABNORMAL HIGH (ref 0.4–3.1)

## 2021-09-29 LAB — INSULIN, FREE AND TOTAL

## 2021-09-29 LAB — TSH: TSH: 0.967 u[IU]/mL (ref 0.350–4.500)

## 2021-09-29 NOTE — Progress Notes (Signed)
[place] ?CONSULT NOTE ? ?Patient Care Team: ?Tawnya Crook, MD as PCP - General (Family Medicine) ? ?CHIEF COMPLAINTS/PURPOSE OF CONSULTATION:  ?Thrombocytopenia. ? ?ASSESSMENT & PLAN:  ? ?This is a pleasant 61 year old female patient with past medical history significant for neuropathy anxiety/depression referred to hematology for evaluation of progressive thrombocytopenia.   ? ?On evaluation of labs, she was found to have progressive anemia, nonimmune mediated hemolysis with reticulocyte count around 5%.  No elevated LDH or total bilirubin.  DAT test is negative, there appears to be some complement consumption.  Given no clear etiology for the nonimmune mediated hemolytic anemia, recommended imaging which showed hepatic cirrhosis and splenomegaly, diffuse thickening of the stomach concerning for infiltrative diseases such as lymphoma or inflammation.   ?BMB showed hypercellular marrow with erythroid hyperplasia, morphologic features do not provide a precise explanation for the patient cytopenias. ?Endoscopy and colonoscopy with evidence of obvious pathology, awaiting surgical pathology ?She was recently admitted and discharged for decompensated cirrhosis. ?She feels well today. No complaints, diuresis has helped her tremendously. ? ?At this time, there is no clear etiology for the anemia, it could be multifactorial including decompensated liver disease, consumption from splenomegaly as well as some hemolysis. I will give her another dose of feraheme. ? ?We discussed about [facility] to [facility]. She is appreciative. ?We will continue labs once a week, standing infusion will be scheduled to transfuse if Hb less than or equal to 8 gm/dl. ? ?HISTORY OF PRESENTING ILLNESS:  ?Theresa Moran 61 y.o. female is here because of thrombocytopenia ? ?This is a very pleasant 61 year old female patient with history of thrombocytopenia referred to hematology for evaluation and recommendations.   ? ?Since  last visit, she was admitted to the hospital. She was diuresed and feels significantly better.  Energy is better ?She denies any bleeding symptoms.  Rest of the pertinent 10 point ROS reviewed and negative. ?MEDICAL HISTORY:  ?Past Medical History:  ?Diagnosis Date  ? Allergy   ? Anxiety   ? Asthma   ? in the past  ? Chronic pain   ? Depression   ? Gallstones   ? GERD (gastroesophageal reflux disease)   ? Headache   ? stopped after menopause  ? Neuromuscular disorder ([place])   ? Urticaria   ? ? ?SURGICAL HISTORY: ?Past Surgical History:  ?Procedure Laterality Date  ? APPENDECTOMY  1979  ? BIOPSY  09/25/2021  ? Procedure: BIOPSY;  Surgeon: Yetta Flock, MD;  Location: [facility] ENDOSCOPY;  Service: Gastroenterology;;  ? BREAST REDUCTION SURGERY    ? BREAST SURGERY  1980  ? breast reduction  ? CHOLECYSTECTOMY    ? COLONOSCOPY WITH PROPOFOL N/A 09/25/2021  ? Procedure: COLONOSCOPY WITH PROPOFOL;  Surgeon: Yetta Flock, MD;  Location: [place];  Service: Gastroenterology;  Laterality: N/A;  ? ESOPHAGOGASTRODUODENOSCOPY N/A 09/25/2021  ? Procedure: ESOPHAGOGASTRODUODENOSCOPY (EGD);  Surgeon: Yetta Flock, MD;  Location: [facility] ENDOSCOPY;  Service: Gastroenterology;  Laterality: N/A;  ? NASAL SEPTUM SURGERY    ? ORIF ANKLE FRACTURE Left 03/26/2021  ? Procedure: OPEN REDUCTION INTERNAL FIXATION (ORIF) ANKLE FRACTURE;  Surgeon: Armond Hang, MD;  Location: [place];  Service: Orthopedics;  Laterality: Left;  ? POLYPECTOMY  09/25/2021  ? Procedure: POLYPECTOMY;  Surgeon: Yetta Flock, MD;  Location[facility] ENDOSCOPY;  Service: Gastroenterology;;  ? ? ?SOCIAL HISTORY: ?Social History  ? ?Socioeconomic History  ? Marital status: Married  ?  Spouse name: Not on file  ? Number of children:  Not on file  ? Years of education: Not on file  ? Highest education level: Not on file  ?Occupational History  ? Not on file  ?Tobacco Use  ? Smoking status: Former  ?  Packs/day: 1.00  ?  Years: 15.00  ?  Pack years: 15.00  ?   Types: Cigarettes  ?  Quit date: 01/2004  ?  Years since quitting: 17.6  ? Smokeless tobacco: Never  ?Vaping Use  ? Vaping Use: Never used  ?Substance and Sexual Activity  ? Alcohol use: Not Currently  ? Drug use: No  ? Sexual activity: Not Currently  ?Other Topics Concern  ? Not on file  ?Social History Narrative  ? Has 1 child(artificial insem), 2 adopted, and 1 step from partner's former partner  ? Raising 4 grandchildren.  ? ?Social Determinants of Health  ? ?Financial Resource Strain: Not on file  ?Food Insecurity: Not on file  ?Transportation Needs: Not on file  ?Physical Activity: Not on file  ?Stress: Not on file  ?Social Connections: Not on file  ?Intimate Partner Violence: Not on file  ? ? ?FAMILY HISTORY: ?Family History  ?Problem Relation Age of Onset  ? Intellectual disability Mother   ? Hypertension Mother   ? Hyperlipidemia Mother   ? Depression Mother   ? Arthritis Mother   ? Mental illness Mother   ? Heart disease Father   ? Diabetes Father   ? Stroke Father   ? Heart disease Brother   ? COPD Brother   ? Mental illness Daughter   ? Learning disabilities Daughter   ? Diabetes Daughter   ? Allergic rhinitis Neg Hx   ? Angioedema Neg Hx   ? Asthma Neg Hx   ? Eczema Neg Hx   ? Immunodeficiency Neg Hx   ? Urticaria Neg Hx   ? Colon cancer Neg Hx   ? Pancreatic cancer Neg Hx   ? Liver cancer Neg Hx   ? Stomach cancer Neg Hx   ? ? ?ALLERGIES:  is allergic to shellfish allergy and shellfish-derived products. ? ?MEDICATIONS:  ?Current Outpatient Medications  ?Medication Sig Dispense Refill  ? baclofen (LIORESAL) 10 MG tablet Take 10 mg by mouth 2 (two) times daily.    ? busPIRone (BUSPAR) 10 MG tablet Take 1 tablet (10 mg total) by mouth 2 (two) times daily. 60 tablet 0  ? carvedilol (COREG) 3.125 MG tablet Take 1 tablet (3.125 mg total) by mouth 2 (two) times daily with a meal. 60 tablet 0  ? ferrous sulfate 325 (65 FE) MG EC tablet Take 1 tablet (325 mg total) by mouth 2 (two) times daily. 60 tablet 2  ?  furosemide (LASIX) 20 MG tablet Take 1 tablet (20 mg total) by mouth daily. 30 tablet 0  ? gabapentin (NEURONTIN) 600 MG tablet Take 600 mg by mouth 2 (two) times daily.    ? midodrine (PROAMATINE) 5 MG tablet Take 1 tablet (5 mg total) by mouth 3 (three) times daily with meals. 90 tablet 0  ? nortriptyline (PAMELOR) 50 MG capsule Take 100 mg by mouth at bedtime.    ? NUCYNTA ER 50 MG 12 hr tablet Take 50 mg by mouth daily.    ? pantoprazole (PROTONIX) 40 MG tablet Take 1 tablet (40 mg total) by mouth daily. 30 tablet 3  ? spironolactone (ALDACTONE) 25 MG tablet Take 1 tablet (25 mg total) by mouth daily. 30 tablet 0  ? ?No current facility-administered medications for this visit.  ? ? ? ?  PHYSICAL EXAMINATION: ?ECOG PERFORMANCE STATUS: 0 - Asymptomatic ? ?Vitals:  ? 09/29/21 1146  ?BP: (!) 112/45  ?Pulse: 98  ?Temp: 97.9 ?F (36.6 ?C)  ?SpO2: 98%  ? ?Filed Weights  ? 09/29/21 1146  ?Weight: 297 lb 3.2 oz (134.8 kg)  ? ?General appearance: Alert oriented and in no acute distress. ?Lower extremity swelling has signifcantly improved compared to last visit. ? ?LABORATORY DATA:  ?I have reviewed the data as listed ?Lab Results  ?Component Value Date  ? WBC 9.0 09/29/2021  ? HGB 9.8 (L) 09/29/2021  ? HCT 30.6 (L) 09/29/2021  ? MCV 94.2 09/29/2021  ? PLT 177 09/29/2021  ? ?  Chemistry   ?   ?Component Value Date/Time  ? NA 142 09/29/2021 1254  ? NA 141 12/18/2019 1506  ? K 4.5 09/29/2021 1254  ? CL 106 09/29/2021 1254  ? CO2 30 09/29/2021 1254  ? BUN 16 09/29/2021 1254  ? BUN 16 12/18/2019 1506  ? CREATININE 1.03 (H) 09/29/2021 1254  ? CREATININE 1.09 (H) 12/24/2015 1649  ? GLU 138 (H) 09/19/2021 0920  ?    ?Component Value Date/Time  ? CALCIUM 10.2 09/29/2021 1254  ? ALKPHOS 111 09/29/2021 1254  ? AST 46 (H) 09/29/2021 1254  ? ALT 20 09/29/2021 1254  ? BILITOT 1.1 09/29/2021 1254  ? BILITOT 0.4 12/18/2019 1506  ?  ? ?I reviewed the labs today with the patient.  No indication for blood transfusion today. ?Hemoglobin today  at 8.6.  Slowly progressive nonimmune mediated hemolysis.  Hemoglobin at baseline was around 14 g/dL.  Mild thrombocytopenia with platelet count ranging from 100 220,000. ?PNH panel normal ?DAT negative

## 2021-09-29 NOTE — Telephone Encounter (Signed)
Scheduled appointment per providers request. Patient aware.  ?

## 2021-09-29 NOTE — Telephone Encounter (Signed)
Transition Care Management Follow-up Telephone Call ?Date of discharge and from where: [place] [facility] 09/29/21 ?How have you been since you were released from the hospital? It's only been 24 hours but I'M ok  ?Any questions or concerns? No ? ?Items Reviewed: ?Did the pt receive and understand the discharge instructions provided? Yes  ?Medications obtained and verified? Yes  ?Other? No  ?Any new allergies since your discharge? No  ?Dietary orders reviewed? Yes ?Do you have support at home? Yes  ? ?Home Care and Equipment/Supplies: ?Were home health services ordered? not applicable ?If so, what is the name of the agency?   ?Has the agency set up a time to come to the patient's home?  ?Were any new equipment or medical supplies ordered?   ?What is the name of the medical supply agency?  ?Were you able to get the supplies/equipment?  ?Do you have any questions related to the use of the equipment or supplies?  ? ?Functional Questionnaire: (I = Independent and D = Dependent) ?ADLs: I ? ?Bathing/Dressing- I ? ?Meal Prep- I ? ?Eating- I ? ?Maintaining continence- I ? ?Transferring/Ambulation- I ? ?Managing Meds- I ? ?Follow up appointments reviewed: ? ?PCP Hospital f/u appt confirmed? Yes  Scheduled to see Dr Cherlynn Kaiser on 10/09/21 @ 10:00. ?[facility] f/u appt confirmed?  Are transportation arrangements needed? No  ?If their condition worsens, is the pt aware to call PCP or go to the Emergency Dept.? Yes ?Was the patient provided with contact information for the PCP's office or ED? Yes ?Was to pt encouraged to call back with questions or concerns? Yes ? ?

## 2021-09-30 LAB — SURGICAL PATHOLOGY

## 2021-10-02 ENCOUNTER — Telehealth: Payer: Self-pay | Admitting: Gastroenterology

## 2021-10-02 NOTE — Telephone Encounter (Signed)
This patient called and said we have ordered bloodwork for her to do next week.  She has bloodwork scheduled to do at the cancer center tomorrow and was wondering if we could send our order over there to do so she would only have to have one stick.  Please call patient and advise.  Thank you. ?

## 2021-10-02 NOTE — Telephone Encounter (Signed)
Called pt and made her aware of Dr. Tarri Glenn response. Expressed appreciation for my call. ?

## 2021-10-03 ENCOUNTER — Other Ambulatory Visit: Payer: Self-pay

## 2021-10-03 ENCOUNTER — Inpatient Hospital Stay: Payer: Medicare Other

## 2021-10-03 VITALS — BP 114/60 | HR 88 | Temp 97.7°F | Resp 18

## 2021-10-03 DIAGNOSIS — D696 Thrombocytopenia, unspecified: Secondary | ICD-10-CM | POA: Diagnosis not present

## 2021-10-03 DIAGNOSIS — D509 Iron deficiency anemia, unspecified: Secondary | ICD-10-CM

## 2021-10-03 MED ORDER — SODIUM CHLORIDE 0.9 % IV SOLN: Freq: Once | INTRAVENOUS | Status: AC

## 2021-10-03 MED ORDER — SODIUM CHLORIDE 0.9 % IV SOLN
510.0000 mg | Freq: Once | INTRAVENOUS | Status: AC
  Administered 2021-10-03: 510 mg via INTRAVENOUS
  Filled 2021-10-03: qty 510

## 2021-10-03 NOTE — Progress Notes (Signed)
Pt observed for 30 minutes post Feraheme infusion. VSS. No complaints at time of discharge.  ?

## 2021-10-03 NOTE — Patient Instructions (Signed)

## 2021-10-06 ENCOUNTER — Ambulatory Visit: Payer: Medicare Other | Admitting: Gastroenterology

## 2021-10-06 ENCOUNTER — Other Ambulatory Visit (INDEPENDENT_AMBULATORY_CARE_PROVIDER_SITE_OTHER): Payer: Medicare Other

## 2021-10-06 DIAGNOSIS — K7460 Unspecified cirrhosis of liver: Secondary | ICD-10-CM | POA: Diagnosis not present

## 2021-10-06 DIAGNOSIS — R933 Abnormal findings on diagnostic imaging of other parts of digestive tract: Secondary | ICD-10-CM | POA: Diagnosis not present

## 2021-10-06 DIAGNOSIS — D696 Thrombocytopenia, unspecified: Secondary | ICD-10-CM | POA: Diagnosis not present

## 2021-10-06 LAB — CBC
HCT: 32.8 % — ABNORMAL LOW (ref 36.0–46.0)
Hemoglobin: 10.8 g/dL — ABNORMAL LOW (ref 12.0–15.0)
MCHC: 33 g/dL (ref 30.0–36.0)
MCV: 93 fl (ref 78.0–100.0)
Platelets: 136 10*3/uL — ABNORMAL LOW (ref 150.0–400.0)
RBC: 3.53 Mil/uL — ABNORMAL LOW (ref 3.87–5.11)
RDW: 19.4 % — ABNORMAL HIGH (ref 11.5–15.5)
WBC: 7.4 10*3/uL (ref 4.0–10.5)

## 2021-10-07 ENCOUNTER — Ambulatory Visit (HOSPITAL_COMMUNITY): Admit: 2021-10-07 | Payer: Medicare Other | Admitting: Gastroenterology

## 2021-10-07 ENCOUNTER — Telehealth: Payer: Self-pay

## 2021-10-07 ENCOUNTER — Encounter (HOSPITAL_COMMUNITY): Payer: Self-pay

## 2021-10-07 LAB — BASIC METABOLIC PANEL
BUN: 18 mg/dL (ref 6–23)
CO2: 25 mEq/L (ref 19–32)
Calcium: 9.7 mg/dL (ref 8.4–10.5)
Chloride: 105 mEq/L (ref 96–112)
Creatinine, Ser: 0.83 mg/dL (ref 0.40–1.20)
GFR: 75.19 mL/min (ref >60.00)
Glucose, Bld: 219 mg/dL — ABNORMAL HIGH (ref 70–99)
Potassium: 4.4 mEq/L (ref 3.5–5.1)
Sodium: 140 mEq/L (ref 135–145)

## 2021-10-07 SURGERY — ESOPHAGOGASTRODUODENOSCOPY (EGD) WITH PROPOFOL
Anesthesia: Monitor Anesthesia Care

## 2021-10-07 NOTE — Telephone Encounter (Signed)
Spoke with pt's spouse, Lindy who states pt is pale and weak, voiced concerns for needing blood transfusion but pt had CBC drawn yesterday and her hgb did not meet parameters for transfusion. Eduardo Osier is asking for answers and is waiting for liver functions to come back for more answers. She knows to go to ED if pt is weaker, has heart palpitations.  ?

## 2021-10-08 ENCOUNTER — Encounter: Payer: Self-pay | Admitting: Family Medicine

## 2021-10-09 ENCOUNTER — Ambulatory Visit (INDEPENDENT_AMBULATORY_CARE_PROVIDER_SITE_OTHER): Payer: Medicare Other | Admitting: Family Medicine

## 2021-10-09 ENCOUNTER — Encounter: Payer: Self-pay | Admitting: Family Medicine

## 2021-10-09 VITALS — BP 122/80 | HR 86 | Temp 98.0°F | Ht 66.0 in | Wt 299.4 lb

## 2021-10-09 DIAGNOSIS — J189 Pneumonia, unspecified organism: Secondary | ICD-10-CM

## 2021-10-09 DIAGNOSIS — K7469 Other cirrhosis of liver: Secondary | ICD-10-CM

## 2021-10-09 DIAGNOSIS — D508 Other iron deficiency anemias: Secondary | ICD-10-CM | POA: Diagnosis not present

## 2021-10-09 DIAGNOSIS — R739 Hyperglycemia, unspecified: Secondary | ICD-10-CM

## 2021-10-09 MED ORDER — CARVEDILOL 3.125 MG PO TABS: 3.1250 mg | ORAL_TABLET | Freq: Two times a day (BID) | ORAL | 1 refills | Status: DC

## 2021-10-09 MED ORDER — FUROSEMIDE 20 MG PO TABS
20.0000 mg | ORAL_TABLET | Freq: Every day | ORAL | 1 refills | Status: DC
Start: 2021-10-09 — End: 2022-04-12

## 2021-10-09 MED ORDER — MIDODRINE HCL 5 MG PO TABS: 5.0000 mg | ORAL_TABLET | Freq: Three times a day (TID) | ORAL | 1 refills | Status: DC

## 2021-10-09 MED ORDER — SPIRONOLACTONE 25 MG PO TABS: 25.0000 mg | ORAL_TABLET | Freq: Every day | ORAL | 1 refills | Status: DC

## 2021-10-09 NOTE — Patient Instructions (Signed)
It was very nice to see you today!  Low sodium diet   PLEASE NOTE:  If you had any lab tests please let us know if you have not heard back within a few days. You may see your results on MyChart before we have a chance to review them but we will give you a call once they are reviewed by [place]. If we ordered any referrals today, please let us know if you have not heard from their office within the next week.   Please try these tips to maintain a healthy lifestyle:  Eat most of your calories during the day when you are active. Eliminate processed foods including packaged sweets (pies, cakes, cookies), reduce intake of potatoes, white bread, white pasta, and white rice. Look for whole grain options, oat flour or almond flour.  Each meal should contain half fruits/vegetables, one quarter protein, and one quarter carbs (no bigger than a computer mouse).  Cut down on sweet beverages. This includes juice, soda, and sweet tea. Also watch fruit intake, though this is a healthier sweet option, it still contains natural sugar! Limit to 3 servings daily.  Drink at least 1 glass of water with each meal and aim for at least 8 glasses per day  Exercise at least 150 minutes every week.

## 2021-10-09 NOTE — Progress Notes (Signed)
Subjective:     Patient ID: Theresa Moran, female    DOB: [birth year], 61 y.o.   MRN: [id number]  Chief Complaint  Patient presents with   Follow-up    ED follow-up Discuss recent results and next steps Had coffee this morning, no food      HPI-here w/wife Was hosp for SOB-pneumonia, and CHF.  Hosp 09/21/21-09/28/21.  Had tachycardia and sob so went to ER. Progressively worsening over prev 3 wks.  Hgb dropped to 6.8 and xfused 2uPRBC.  CAP-R side.  Finished abx  Hemolytic anemia-seeing heme-supposed to refer to [facility] in June 8.  EGD/colon-1 polyp and some varicies. But not bleeding.  On BB. Partner prefers [facility] for f/u as will go for heme as well.  No ETOH, no fh cirrhosis.  Hep studies neg. Lost 27# of fluid.  Echo ok. IMPRESSIONS  1. Left ventricular ejection fraction, by estimation, is 60 to 65%. The left ventricle has normal function. The left ventricle has no regional wall motion abnormalities. Left ventricular diastolic parameters were normal.  2. Right ventricular systolic function is normal. The right ventricular size is normal. There is moderately elevated pulmonary artery systolic pressure.  3. Left atrial size was mildly dilated.  4. The mitral valve is grossly normal. Mild mitral valve regurgitation. No evidence of mitral stenosis.  5. The aortic valve is grossly normal. There is mild calcification of the aortic valve. There is mild thickening of the aortic valve. Aortic valve regurgitation is not visualized. Aortic valve sclerosis/calcification is present, without any evidence of aortic stenosis.  6. The inferior vena cava is dilated in size with >50% respiratory variability, suggesting right atrial pressure of 8 mmHg.  ETOH-2/yr. On Serzone for 25 yrs-off market 2 yrs ago for liver issues.  Pt LFT's have been ok.   Sleeping more. No energy, some cognitive deficits.   Some days "foggy" brained.   Health Maintenance Due  Topic Date Due   Zoster Vaccines- Shingrix (1 of  2) Never done   PAP SMEAR-Modifier  03/22/2021    Past Medical History:  Diagnosis Date   Allergy    Anxiety    Asthma    in the past   Chronic pain    Depression    Gallstones    GERD (gastroesophageal reflux disease)    Headache    stopped after menopause   Neuromuscular disorder ([place])    Urticaria     Past Surgical History:  Procedure Laterality Date   APPENDECTOMY  1979   BIOPSY  09/25/2021   Procedure: BIOPSY;  Surgeon: Yetta Flock, MD;  Location: [place];  Service: Gastroenterology;;   BREAST REDUCTION SURGERY     BREAST SURGERY  1980   breast reduction   CHOLECYSTECTOMY     COLONOSCOPY WITH PROPOFOL N/A 09/25/2021   Procedure: COLONOSCOPY WITH PROPOFOL;  Surgeon: Yetta Flock, MD;  Location: [place];  Service: Gastroenterology;  Laterality: N/A;   ESOPHAGOGASTRODUODENOSCOPY N/A 09/25/2021   Procedure: ESOPHAGOGASTRODUODENOSCOPY (EGD);  Surgeon: Yetta Flock, MD;  Location: [facility] ENDOSCOPY;  Service: Gastroenterology;  Laterality: N/A;   NASAL SEPTUM SURGERY     ORIF ANKLE FRACTURE Left 03/26/2021   Procedure: OPEN REDUCTION INTERNAL FIXATION (ORIF) ANKLE FRACTURE;  Surgeon: Armond Hang, MD;  Location: [place];  Service: Orthopedics;  Laterality: Left;   POLYPECTOMY  09/25/2021   Procedure: POLYPECTOMY;  Surgeon: Yetta Flock, MD;  Location: [facility] ENDOSCOPY;  Service: Gastroenterology;;    Outpatient Medications Prior to Visit  Medication Sig Dispense Refill   busPIRone (BUSPAR) 10 MG tablet Take 1 tablet (10 mg total) by mouth 2 (two) times daily. 60 tablet 0   ferrous sulfate 325 (65 FE) MG EC tablet Take 1 tablet (325 mg total) by mouth 2 (two) times daily. 60 tablet 2   gabapentin (NEURONTIN) 600 MG tablet Take 600 mg by mouth 2 (two) times daily.     nortriptyline (PAMELOR) 50 MG capsule Take 100 mg by mouth at bedtime.     NUCYNTA ER 50 MG 12 hr tablet Take 50 mg by mouth daily.     pantoprazole (PROTONIX) 40 MG tablet Take  1 tablet (40 mg total) by mouth daily. 30 tablet 3   carvedilol (COREG) 3.125 MG tablet Take 1 tablet (3.125 mg total) by mouth 2 (two) times daily with a meal. 60 tablet 0   furosemide (LASIX) 20 MG tablet Take 1 tablet (20 mg total) by mouth daily. 30 tablet 0   midodrine (PROAMATINE) 5 MG tablet Take 1 tablet (5 mg total) by mouth 3 (three) times daily with meals. 90 tablet 0   spironolactone (ALDACTONE) 25 MG tablet Take 1 tablet (25 mg total) by mouth daily. 30 tablet 0   baclofen (LIORESAL) 10 MG tablet Take 10 mg by mouth 2 (two) times daily.     No facility-administered medications prior to visit.    Allergies  Allergen Reactions   Shellfish Allergy Anaphylaxis, Hives, Itching, Shortness Of Breath and Swelling   Shellfish-Derived Products    ROS neg/noncontributory except as noted HPI/below       Objective:     BP 122/80   Pulse 86   Temp 98 F (36.7 C) (Temporal)   Ht '5\' 6"'$  (1.676 m)   Wt 299 lb 6 oz (135.8 kg)   SpO2 97%   BMI 48.32 kg/m  Wt Readings from Last 3 Encounters:  10/09/21 299 lb 6 oz (135.8 kg)  09/29/21 297 lb 3.2 oz (134.8 kg)  09/28/21 (!) 311 lb 4.6 oz (141.2 kg)    Physical Exam   Gen: [facility]N NAD MOWF HEENT: NCAT, conjunctiva not injected, sclera nonicteric NECK:  supple, no thyromegaly, no nodes, no carotid bruits CARDIAC: RRR, S1S2+, no murmur. DP 2+B LUNGS: CTAB. No wheezes ABDOMEN:  BS+, soft, NTND, No HSM, no masses EXT:  tr edema MSK: no gross abnormalities. Using walker NEURO: A&O x3.  CN II-XII intact.  PSYCH: normal mood. Good eye contact  Had been reviewing records as available.  Spent 45 min after pt left reviewing chart.  Spent 49mn w/pt/wife answering questions, updating hx, etc. .     Assessment & Plan:   Problem List Items Addressed This Visit       Respiratory   CAP (community acquired pneumonia)     Digestive   Cirrhosis of liver ([place]     Other   IDA (iron deficiency anemia)   Other Visit Diagnoses      Hyperglycemia    -  Primary   Relevant Orders   Hemoglobin A1c   Comprehensive metabolic panel      Community acquired pneumonia-tx'd.  Resolved Iron def anemia-etiol unk.  Seeing heme.  Getting xfusions and iron infusions.  Had BM bx-no clear etiol.  [facility]-they see heme tomorrow so will f/u,. 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  ? From meds in past, ? From fatty liver, other.  Has been seen by GI.  Had EGD/colon-some esoph varicies.  Currently stable on coreg 3.'125mg'$  bid, lasix '20mg'$  daily and spironolactone '25mg'$  daily-continue.  Sees GI 6/8.  Would like referral to [facility] to try to co-ordinate care w/heme there as well.  Advised 2g sodium diet.   Hyperglycemia on recent bmp.  Will check again and A1C-but may be inconclusive d/t xfusion.    GI [facility] ordered this encounter  Medications   carvedilol (COREG) 3.125 MG tablet    Sig: Take 1 tablet (3.125 mg total) by mouth 2 (two) times daily with a meal.    Dispense:  180 tablet    Refill:  1   spironolactone (ALDACTONE) 25 MG tablet    Sig: Take 1 tablet (25 mg total) by mouth daily.    Dispense:  90 tablet    Refill:  1   furosemide (LASIX) 20 MG tablet    Sig: Take 1 tablet (20 mg total) by mouth daily.    Dispense:  90 tablet    Refill:  1   midodrine (PROAMATINE) 5 MG tablet    Sig: Take 1 tablet (5 mg total) by mouth 3 (three) times daily with meals.    Dispense:  270 tablet    Refill:  1    Wellington Hampshire, MD

## 2021-10-10 ENCOUNTER — Inpatient Hospital Stay: Payer: Medicare Other

## 2021-10-10 ENCOUNTER — Other Ambulatory Visit: Payer: Self-pay | Admitting: *Deleted

## 2021-10-10 ENCOUNTER — Other Ambulatory Visit: Payer: Self-pay | Admitting: Family Medicine

## 2021-10-10 ENCOUNTER — Other Ambulatory Visit: Payer: Self-pay

## 2021-10-10 DIAGNOSIS — D599 Acquired hemolytic anemia, unspecified: Secondary | ICD-10-CM

## 2021-10-10 DIAGNOSIS — D696 Thrombocytopenia, unspecified: Secondary | ICD-10-CM

## 2021-10-10 DIAGNOSIS — R000 Tachycardia, unspecified: Secondary | ICD-10-CM

## 2021-10-10 LAB — CBC WITH DIFFERENTIAL/PLATELET
Abs Immature Granulocytes: 0.01 10*3/uL (ref 0.00–0.07)
Basophils Absolute: 0.1 10*3/uL (ref 0.0–0.1)
Basophils Relative: 1 %
Eosinophils Absolute: 0.3 10*3/uL (ref 0.0–0.5)
Eosinophils Relative: 4 %
HCT: 37.7 % (ref 36.0–46.0)
Hemoglobin: 12.2 g/dL (ref 12.0–15.0)
Immature Granulocytes: 0 %
Lymphocytes Relative: 25 %
Lymphs Abs: 1.7 10*3/uL (ref 0.7–4.0)
MCH: 30.6 pg (ref 26.0–34.0)
MCHC: 32.4 g/dL (ref 30.0–36.0)
MCV: 94.5 fL (ref 80.0–100.0)
Monocytes Absolute: 0.5 10*3/uL (ref 0.1–1.0)
Monocytes Relative: 8 %
Neutro Abs: 4.4 10*3/uL (ref 1.7–7.7)
Neutrophils Relative %: 62 %
Platelets: 142 10*3/uL — ABNORMAL LOW (ref 150–400)
RBC: 3.99 MIL/uL (ref 3.87–5.11)
RDW: 18.5 % — ABNORMAL HIGH (ref 11.5–15.5)
WBC: 7 10*3/uL (ref 4.0–10.5)
nRBC: 0 % (ref 0.0–0.2)

## 2021-10-10 LAB — COMPREHENSIVE METABOLIC PANEL
ALT: 32 U/L (ref 0–44)
AST: 64 U/L — ABNORMAL HIGH (ref 15–41)
Albumin: 4.4 g/dL (ref 3.5–5.0)
Alkaline Phosphatase: 182 U/L — ABNORMAL HIGH (ref 38–126)
Anion gap: 6 (ref 5–15)
BUN: 21 mg/dL (ref 8–23)
CO2: 28 mmol/L (ref 22–32)
Calcium: 9.7 mg/dL (ref 8.9–10.3)
Chloride: 106 mmol/L (ref 98–111)
Creatinine, Ser: 1.09 mg/dL — ABNORMAL HIGH (ref 0.44–1.00)
GFR, Estimated: 57 mL/min — ABNORMAL LOW (ref >60)
Glucose, Bld: 105 mg/dL — ABNORMAL HIGH (ref 70–99)
Potassium: 4.8 mmol/L (ref 3.5–5.1)
Sodium: 140 mmol/L (ref 135–145)
Total Bilirubin: 1 mg/dL (ref 0.3–1.2)
Total Protein: 8.9 g/dL — ABNORMAL HIGH (ref 6.5–8.1)

## 2021-10-10 LAB — SAMPLE TO BLOOD BANK

## 2021-10-10 LAB — TSH: TSH: 1.838 u[IU]/mL (ref 0.350–4.500)

## 2021-10-10 NOTE — Progress Notes (Signed)
Hgb 12.2. Patient made aware no need for transfusion and provided a copy of lab results. She had no questions or concerns and is aware of upcoming appointments.

## 2021-10-11 DIAGNOSIS — K7682 Hepatic encephalopathy: Secondary | ICD-10-CM

## 2021-10-11 HISTORY — DX: Hepatic encephalopathy: K76.82

## 2021-10-11 HISTORY — DX: Morbid (severe) obesity due to excess calories: E66.01

## 2021-10-13 LAB — HGB A1C W/O EAG: Hgb A1c MFr Bld: 4.9 % (ref 4.8–5.6)

## 2021-10-14 ENCOUNTER — Other Ambulatory Visit: Payer: Self-pay

## 2021-10-14 ENCOUNTER — Inpatient Hospital Stay (HOSPITAL_BASED_OUTPATIENT_CLINIC_OR_DEPARTMENT_OTHER): Payer: Medicare Other | Admitting: Hematology and Oncology

## 2021-10-14 ENCOUNTER — Inpatient Hospital Stay: Payer: Medicare Other

## 2021-10-14 ENCOUNTER — Telehealth: Payer: Self-pay

## 2021-10-14 ENCOUNTER — Ambulatory Visit: Payer: Medicare Other

## 2021-10-14 ENCOUNTER — Encounter: Payer: Self-pay | Admitting: Hematology and Oncology

## 2021-10-14 VITALS — BP 133/56 | HR 98 | Temp 97.7°F | Resp 18 | Ht 66.0 in | Wt 298.5 lb

## 2021-10-14 DIAGNOSIS — D649 Anemia, unspecified: Secondary | ICD-10-CM

## 2021-10-14 DIAGNOSIS — D479 Neoplasm of uncertain behavior of lymphoid, hematopoietic and related tissue, unspecified: Secondary | ICD-10-CM

## 2021-10-14 DIAGNOSIS — D599 Acquired hemolytic anemia, unspecified: Secondary | ICD-10-CM

## 2021-10-14 DIAGNOSIS — D508 Other iron deficiency anemias: Secondary | ICD-10-CM

## 2021-10-14 DIAGNOSIS — D696 Thrombocytopenia, unspecified: Secondary | ICD-10-CM | POA: Diagnosis not present

## 2021-10-14 DIAGNOSIS — K7469 Other cirrhosis of liver: Secondary | ICD-10-CM

## 2021-10-14 DIAGNOSIS — R718 Other abnormality of red blood cells: Secondary | ICD-10-CM

## 2021-10-14 DIAGNOSIS — D509 Iron deficiency anemia, unspecified: Secondary | ICD-10-CM | POA: Diagnosis not present

## 2021-10-14 DIAGNOSIS — R000 Tachycardia, unspecified: Secondary | ICD-10-CM

## 2021-10-14 LAB — AMMONIA: Ammonia: 62 umol/L — ABNORMAL HIGH (ref 9–35)

## 2021-10-14 LAB — CBC WITH DIFFERENTIAL/PLATELET
Abs Immature Granulocytes: 0.01 10*3/uL (ref 0.00–0.07)
Basophils Absolute: 0.1 10*3/uL (ref 0.0–0.1)
Basophils Relative: 1 %
Eosinophils Absolute: 0.3 10*3/uL (ref 0.0–0.5)
Eosinophils Relative: 4 %
HCT: 35.3 % — ABNORMAL LOW (ref 36.0–46.0)
Hemoglobin: 11.6 g/dL — ABNORMAL LOW (ref 12.0–15.0)
Immature Granulocytes: 0 %
Lymphocytes Relative: 33 %
Lymphs Abs: 2.2 10*3/uL (ref 0.7–4.0)
MCH: 31 pg (ref 26.0–34.0)
MCHC: 32.9 g/dL (ref 30.0–36.0)
MCV: 94.4 fL (ref 80.0–100.0)
Monocytes Absolute: 0.6 10*3/uL (ref 0.1–1.0)
Monocytes Relative: 10 %
Neutro Abs: 3.5 10*3/uL (ref 1.7–7.7)
Neutrophils Relative %: 52 %
Platelets: 94 10*3/uL — ABNORMAL LOW (ref 150–400)
RBC: 3.74 MIL/uL — ABNORMAL LOW (ref 3.87–5.11)
RDW: 17.9 % — ABNORMAL HIGH (ref 11.5–15.5)
WBC: 6.7 10*3/uL (ref 4.0–10.5)
nRBC: 0 % (ref 0.0–0.2)

## 2021-10-14 LAB — COMPREHENSIVE METABOLIC PANEL
ALT: 31 U/L (ref 0–44)
AST: 58 U/L — ABNORMAL HIGH (ref 15–41)
Albumin: 4 g/dL (ref 3.5–5.0)
Alkaline Phosphatase: 133 U/L — ABNORMAL HIGH (ref 38–126)
Anion gap: 4 — ABNORMAL LOW (ref 5–15)
BUN: 14 mg/dL (ref 8–23)
CO2: 30 mmol/L (ref 22–32)
Calcium: 9.5 mg/dL (ref 8.9–10.3)
Chloride: 106 mmol/L (ref 98–111)
Creatinine, Ser: 0.93 mg/dL (ref 0.44–1.00)
GFR, Estimated: >60 mL/min (ref >60)
Glucose, Bld: 97 mg/dL (ref 70–99)
Potassium: 3.8 mmol/L (ref 3.5–5.1)
Sodium: 140 mmol/L (ref 135–145)
Total Bilirubin: 1 mg/dL (ref 0.3–1.2)
Total Protein: 7.7 g/dL (ref 6.5–8.1)

## 2021-10-14 LAB — TSH: TSH: 1.795 u[IU]/mL (ref 0.350–4.500)

## 2021-10-14 NOTE — Progress Notes (Signed)
[facility] [place] CONSULT NOTE  Patient Care Team: Tawnya Crook, MD as PCP - General (Family Medicine)  CHIEF COMPLAINTS/PURPOSE OF CONSULTATION:  Thrombocytopenia.  ASSESSMENT & PLAN:   This is a pleasant 61 year old female patient with past medical history significant for neuropathy anxiety/depression referred to hematology for evaluation of progressive thrombocytopenia.    On evaluation of labs, she was found to have progressive anemia, nonimmune mediated hemolysis with reticulocyte count around 5%.  No elevated LDH or total bilirubin.  DAT test is negative, there appears to be some complement consumption.  Given no clear etiology for the nonimmune mediated hemolytic anemia, recommended imaging which showed hepatic cirrhosis and splenomegaly, diffuse thickening of the stomach concerning for infiltrative diseases such as lymphoma or inflammation.   BMB showed hypercellular marrow with erythroid hyperplasia, morphologic features do not provide a precise explanation for the patient cytopenias. Endoscopy and colonoscopy with evidence of obvious pathology, awaiting surgical pathology She was recently admitted and discharged for decompensated cirrhosis and encephalopathy  She is here with her wife today to the appointment.  She was admitted for hepatic encephalopathy last week and was discharged.  She currently continues on lactulose.  She is scheduled to see a hepatologist tomorrow.  She denies any new complaints.  Physical examination, she appears well, no pallor, lower extremity swelling significantly better. Labs today show hemoglobin ranging around 11 to 12 g which is significantly better compared to her baseline. At this time there is no indication for transfusion.  She will continue on oral iron and will return to clinic for follow-up in 2 weeks with repeat labs.   HISTORY OF PRESENTING ILLNESS:  Theresa Moran 61 y.o. female is here because of thrombocytopenia  This is a very  pleasant 61 year old female patient with history of thrombocytopenia referred to hematology for evaluation and recommendations.    Since last visit, she was admitted to the hospital. She was diuresed and feels significantly better.  Energy is better She denies any bleeding symptoms.  Rest of the pertinent 10 point ROS reviewed and negative. MEDICAL HISTORY:  Past Medical History:  Diagnosis Date   Allergy    Anxiety    Asthma    in the past   Chronic pain    Depression    Gallstones    GERD (gastroesophageal reflux disease)    Headache    stopped after menopause   Neuromuscular disorder ([place])    Urticaria     SURGICAL HISTORY: Past Surgical History:  Procedure Laterality Date   APPENDECTOMY  1979   BIOPSY  09/25/2021   Procedure: BIOPSY;  Surgeon: Yetta Flock, MD;  Location: [facility];  Service: Gastroenterology;;   BREAST REDUCTION SURGERY     BREAST SURGERY  1980   breast reduction   CHOLECYSTECTOMY     COLONOSCOPY WITH PROPOFOL N/A 09/25/2021   Procedure: COLONOSCOPY WITH PROPOFOL;  Surgeon: Yetta Flock, MD;  Location: [facility];  Service: Gastroenterology;  Laterality: N/A;   ESOPHAGOGASTRODUODENOSCOPY N/A 09/25/2021   Procedure: ESOPHAGOGASTRODUODENOSCOPY (EGD);  Surgeon: Yetta Flock, MD;  Location: [facility];  Service: Gastroenterology;  Laterality: N/A;   NASAL SEPTUM SURGERY     ORIF ANKLE FRACTURE Left 03/26/2021   Procedure: OPEN REDUCTION INTERNAL FIXATION (ORIF) ANKLE FRACTURE;  Surgeon: Armond Hang, MD;  Location: [facility];  Service: Orthopedics;  Laterality: Left;   POLYPECTOMY  09/25/2021   Procedure: POLYPECTOMY;  Surgeon: Yetta Flock, MD;  Location: [facility] ENDOSCOPY;  Service: Gastroenterology;;  SOCIAL HISTORY: Social History   Socioeconomic History   Marital status: Married    Spouse name: Not on file   Number of children: Not on file   Years of education: Not on file   Highest education level: Not on file   Occupational History   Not on file  Tobacco Use   Smoking status: Former    Packs/day: 1.00    Years: 15.00    Pack years: 15.00    Types: Cigarettes    Quit date: 01/2004    Years since quitting: 17.7   Smokeless tobacco: Never  Vaping Use   Vaping Use: Never used  Substance and Sexual Activity   Alcohol use: Not Currently   Drug use: No   Sexual activity: Not Currently  Other Topics Concern   Not on file  Social History Narrative   Has 1 child(artificial insem), 2 adopted, and 1 step from partner's former partner   Raising 4 grandchildren.   Social Determinants of Health   Financial Resource Strain: Not on file  Food Insecurity: Not on file  Transportation Needs: Not on file  Physical Activity: Not on file  Stress: Not on file  Social Connections: Not on file  Intimate Partner Violence: Not on file    FAMILY HISTORY: Family History  Problem Relation Age of Onset   Intellectual disability Mother    Hypertension Mother    Hyperlipidemia Mother    Depression Mother    Arthritis Mother    Mental illness Mother    Heart disease Father    Diabetes Father    Stroke Father    Heart disease Brother    COPD Brother    Mental illness Daughter    Learning disabilities Daughter    Diabetes Daughter    Allergic rhinitis Neg Hx    Angioedema Neg Hx    Asthma Neg Hx    Eczema Neg Hx    Immunodeficiency Neg Hx    Urticaria Neg Hx    Colon cancer Neg Hx    Pancreatic cancer Neg Hx    Liver cancer Neg Hx    Stomach cancer Neg Hx     ALLERGIES:  is allergic to shellfish allergy and shellfish-derived products.  MEDICATIONS:  Current Outpatient Medications  Medication Sig Dispense Refill   busPIRone (BUSPAR) 10 MG tablet Take 1 tablet (10 mg total) by mouth 2 (two) times daily. 60 tablet 0   carvedilol (COREG) 3.125 MG tablet Take 1 tablet (3.125 mg total) by mouth 2 (two) times daily with a meal. 180 tablet 1   ferrous sulfate 325 (65 FE) MG EC tablet Take 1  tablet (325 mg total) by mouth 2 (two) times daily. 60 tablet 2   furosemide (LASIX) 20 MG tablet Take 1 tablet (20 mg total) by mouth daily. 90 tablet 1   gabapentin (NEURONTIN) 600 MG tablet Take 600 mg by mouth 2 (two) times daily.     midodrine (PROAMATINE) 5 MG tablet Take 1 tablet (5 mg total) by mouth 3 (three) times daily with meals. 270 tablet 1   nortriptyline (PAMELOR) 50 MG capsule Take 100 mg by mouth at bedtime.     NUCYNTA ER 50 MG 12 hr tablet Take 50 mg by mouth daily.     pantoprazole (PROTONIX) 40 MG tablet Take 1 tablet (40 mg total) by mouth daily. 30 tablet 3   spironolactone (ALDACTONE) 25 MG tablet Take 1 tablet (25 mg total) by mouth daily. 90 tablet 1  No current facility-administered medications for this visit.     PHYSICAL EXAMINATION: ECOG PERFORMANCE STATUS: 0 - Asymptomatic  Vitals:   10/14/21 1349  BP: (!) 133/56  Pulse: 98  Resp: 18  Temp: 97.7 F (36.5 C)  SpO2: 99%   Filed Weights   10/14/21 1349  Weight: 298 lb 8 oz (135.4 kg)   General appearance: Alert oriented and in no acute distress. Lower extremity swelling has signifcantly improved compared to last visit.  LABORATORY DATA:  I have reviewed the data as listed Lab Results  Component Value Date   WBC 6.7 10/14/2021   HGB 11.6 (L) 10/14/2021   HCT 35.3 (L) 10/14/2021   MCV 94.4 10/14/2021   PLT 94 (L) 10/14/2021     Chemistry      Component Value Date/Time   NA 140 10/14/2021 1318   NA 141 12/18/2019 1506   K 3.8 10/14/2021 1318   CL 106 10/14/2021 1318   CO2 30 10/14/2021 1318   BUN 14 10/14/2021 1318   BUN 16 12/18/2019 1506   CREATININE 0.93 10/14/2021 1318   CREATININE 1.09 (H) 12/24/2015 1649   GLU 138 (H) 09/19/2021 0920      Component Value Date/Time   CALCIUM 9.5 10/14/2021 1318   ALKPHOS 133 (H) 10/14/2021 1318   AST 58 (H) 10/14/2021 1318   ALT 31 10/14/2021 1318   BILITOT 1.0 10/14/2021 1318   BILITOT 0.4 12/18/2019 1506     I reviewed the labs today  with the patient.  No indication for blood transfusion today. Hemoglobin today at 8.6.  Slowly progressive nonimmune mediated hemolysis.  Hemoglobin at baseline was around 14 g/dL.  Mild thrombocytopenia with platelet count ranging from 100 220,000. PNH panel normal DAT negative SPEP without any evidence of myeloma. Complete complement consumption noted Cold agglutinin titer pending. Immunoglobulin levels are unremarkable except for elevated IgA.  RADIOGRAPHIC STUDIES: I have personally reviewed the radiological images as listed and agreed with the findings in the report. CT Angio Chest PE W and/or Wo Contrast  Result Date: 09/21/2021 CLINICAL DATA:  61 year old female with shortness of breath and tachycardia. Hemolytic anemia. EXAM: CT ANGIOGRAPHY CHEST WITH CONTRAST TECHNIQUE: Multidetector CT imaging of the chest was performed using the standard protocol during bolus administration of intravenous contrast. Multiplanar CT image reconstructions and MIPs were obtained to evaluate the vascular anatomy. RADIATION DOSE REDUCTION: This exam was performed according to the departmental dose-optimization program which includes automated exposure control, adjustment of the mA and/or kV according to patient size and/or use of iterative reconstruction technique. CONTRAST:  Total of 200 mL OMNIPAQUE IOHEXOL 350 MG/ML SOLN COMPARISON:  Portable chest 0144 hours. CT Chest, Abdomen, and Pelvis 09/11/2021. FINDINGS: Cardiovascular: Largely inadequate initial bolus repeated contrast bolus timing in the pulmonary arterial tree. At 0716 hours and is adequate. Mild respiratory motion. No pulmonary artery filling defect identified. Calcified coronary artery atherosclerosis evident on series 7, image 79. Minimal thoracic aortic atherosclerosis. No cardiomegaly or pericardial effusion. Mediastinum/Nodes: No mediastinal mass or lymphadenopathy. Lungs/Pleura: Lower lung volumes with atelectatic changes to the major airways  today including the carina. Small bilateral layering pleural effusions. Confluent new multifocal lower lobe peribronchial opacity with early consolidation, greater on the right. Lingula atelectasis appears stable but there is new right middle lobe lateral segment peribronchial nodularity which appears inflammatory. No middle lobe consolidation. Simple atelectasis in the upper lobes. Upper Abdomen: Stable visible upper abdomen since 09/11/2021; nodular cirrhotic liver. No ascites. On the later images contrast is being  excreted to both renal collecting systems. Musculoskeletal: No acute osseous abnormality identified. Review of the MIP images confirms the above findings. IMPRESSION: 1. Vascular contrast bolus had to be repeated. No pulmonary embolus identified. 2. Lower lung volumes from 10 days ago with small new layering pleural effusions and new with multifocal bilateral lower lobe and right middle lobe opacity most resembling Multifocal Pneumonia. 3. Cirrhosis.  Calcified coronary artery atherosclerosis. Electronically Signed   By: Genevie Ann M.D.   On: 09/21/2021 07:47   MR ABDOMEN W WO CONTRAST  Result Date: 09/26/2021 CLINICAL DATA: Hemolytic anemia.  Cirrhosis.  Recent iron infusion 2 days ago. EXAM: MRI ABDOMEN WITHOUT AND WITH CONTRAST TECHNIQUE: Multiplanar multisequence MR imaging of the abdomen was performed both before and after the administration of intravenous contrast. CONTRAST:  46m GADAVIST GADOBUTROL 1 MMOL/ML IV SOLN COMPARISON:  CT on 09/11/2021 FINDINGS: Lower chest: Mild bibasilar atelectasis. Hepatobiliary: Caudate and left hepatic lobe hypertrophy and diffuse capsular nodularity are consistent with cirrhosis. Diffuse T2 hypointensity of the hepatic parenchyma is consistent with hemosiderosis. No hepatic masses are identified. No evidence of ascites. Prior cholecystectomy. No evidence of biliary obstruction. Pancreas:  Normal appearance. Spleen: No evidence of splenomegaly. Diffuse T2  hypointensity of splenic parenchyma is consistent with hemosiderosis. No masses identified. Adrenals/Urinary Tract: No masses identified. No evidence of hydronephrosis. Stomach/Bowel: Unremarkable. Vascular/Lymphatic: No pathologically enlarged lymph nodes identified. No acute vascular findings. Recanalization of paraumbilical veins is consistent with portal venous hypertension. Other:  None. Musculoskeletal: No suspicious bone lesions identified. Diffuse bone marrow T2 hypointensity, consistent with hemosiderosis. IMPRESSION: Hepatic cirrhosis and findings of portal venous hypertension. No evidence of hepatic neoplasm. Transfusional siderosis. Electronically Signed   By: JMarlaine HindM.D.   On: 09/26/2021 21:45   DG Chest Port 1 View  Result Date: 09/21/2021 CLINICAL DATA:  Shortness of breath and tachycardia EXAM: PORTABLE CHEST 1 VIEW COMPARISON:  03/10/2021 FINDINGS: Cardiac shadow is enlarged. Aortic calcifications are noted. Lungs are well aerated bilaterally with evidence of mild vascular congestion and interstitial edema. More marked airspace opacity is noted in the right base consistent with early pneumonia. No bony abnormality is seen. IMPRESSION: Right basilar pneumonia superimposed over mild changes of CHF. Electronically Signed   By: MInez CatalinaM.D.   On: 09/21/2021 01:53   ECHOCARDIOGRAM COMPLETE  Result Date: 09/22/2021    ECHOCARDIOGRAM REPORT   Patient Name:   AWINFRED REDELDate of Exam: 09/22/2021 Medical Rec #:  [id number]   Height:       66.0 in Accession #:    [id number]  Weight:       325.0 lb Date of Birth:  403-Aug-1962    BSA:          2.458 m Patient Age:    89years     BP:           128/59 mmHg Patient Gender: F            HR:           114 bpm. Exam Location:  Inpatient Procedure: 2D Echo, 3D Echo, Cardiac Doppler and Color Doppler Indications:    Hemolytic amemia.; R06.9 DOE  History:        Patient has no prior history of Echocardiogram examinations.                 Abnormal ECG;  Arrythmias:Tachycardia.  Sonographer:    TRoseanna RainbowRDCS Referring Phys: [id number] SMITH  Sonographer Comments:  Technically difficult study due to poor echo windows and patient is morbidly obese. Image acquisition challenging due to patient body habitus. IMPRESSIONS  1. Left ventricular ejection fraction, by estimation, is 60 to 65%. The left ventricle has normal function. The left ventricle has no regional wall motion abnormalities. Left ventricular diastolic parameters were normal.  2. Right ventricular systolic function is normal. The right ventricular size is normal. There is moderately elevated pulmonary artery systolic pressure.  3. Left atrial size was mildly dilated.  4. The mitral valve is grossly normal. Mild mitral valve regurgitation. No evidence of mitral stenosis.  5. The aortic valve is grossly normal. There is mild calcification of the aortic valve. There is mild thickening of the aortic valve. Aortic valve regurgitation is not visualized. Aortic valve sclerosis/calcification is present, without any evidence of aortic stenosis.  6. The inferior vena cava is dilated in size with >50% respiratory variability, suggesting right atrial pressure of 8 mmHg. Comparison(s): No prior Echocardiogram. Conclusion(s)/Recommendation(s): Otherwise normal echocardiogram, with minor abnormalities described in the report. FINDINGS  Left Ventricle: Left ventricular ejection fraction, by estimation, is 60 to 65%. The left ventricle has normal function. The left ventricle has no regional wall motion abnormalities. The left ventricular internal cavity size was normal in size. There is  no left ventricular hypertrophy. Left ventricular diastolic parameters were normal. Right Ventricle: The right ventricular size is normal. Right vetricular wall thickness was not well visualized. Right ventricular systolic function is normal. There is moderately elevated pulmonary artery systolic pressure. The tricuspid regurgitant  velocity is 3.08 m/s, and with an assumed right atrial pressure of 8 mmHg, the estimated right ventricular systolic pressure is 16.1 mmHg. Left Atrium: Left atrial size was mildly dilated. Right Atrium: Right atrial size was normal in size. Pericardium: There is no evidence of pericardial effusion. Mitral Valve: The mitral valve is grossly normal. Mild mitral valve regurgitation. No evidence of mitral valve stenosis. MV peak gradient, 18.1 mmHg. The mean mitral valve gradient is 8.0 mmHg. Tricuspid Valve: The tricuspid valve is normal in structure. Tricuspid valve regurgitation is trivial. No evidence of tricuspid stenosis. Aortic Valve: The aortic valve is grossly normal. There is mild calcification of the aortic valve. There is mild thickening of the aortic valve. Aortic valve regurgitation is not visualized. Aortic valve sclerosis/calcification is present, without any evidence of aortic stenosis. Aortic valve mean gradient measures 7.0 mmHg. Aortic valve peak gradient measures 13.1 mmHg. Aortic valve area, by VTI measures 3.39 cm. Pulmonic Valve: The pulmonic valve was not well visualized. Pulmonic valve regurgitation is not visualized. No evidence of pulmonic stenosis. Aorta: The aortic root, ascending aorta, aortic arch and descending aorta are all structurally normal, with no evidence of dilitation or obstruction. Venous: The inferior vena cava is dilated in size with greater than 50% respiratory variability, suggesting right atrial pressure of 8 mmHg. IAS/Shunts: The interatrial septum was not well visualized.  LEFT VENTRICLE PLAX 2D LVIDd:         4.90 cm     Diastology LVIDs:         3.00 cm     LV e' medial:    14.10 cm/s LV PW:         0.80 cm     LV E/e' medial:  11.2 LV IVS:        0.90 cm     LV e' lateral:   12.60 cm/s LVOT diam:     2.00 cm     LV E/e' lateral:  12.5 LV SV:         89 LV SV Index:   36 LVOT Area:     3.14 cm                             3D Volume EF: LV Volumes (MOD)           3D EF:         64 % LV vol d, MOD A2C: 79.4 ml LV EDV:       187 ml LV vol d, MOD A4C: 88.3 ml LV ESV:       67 ml LV vol s, MOD A2C: 32.7 ml LV SV:        120 ml LV vol s, MOD A4C: 28.5 ml LV SV MOD A2C:     46.7 ml LV SV MOD A4C:     88.3 ml LV SV MOD BP:      54.0 ml RIGHT VENTRICLE             IVC RV Basal diam:  3.78 cm     IVC diam: 2.10 cm RV S prime:     13.20 cm/s TAPSE (M-mode): 2.4 cm LEFT ATRIUM             Index        RIGHT ATRIUM           Index LA diam:        3.60 cm 1.46 cm/m   RA Area:     15.25 cm LA Vol (A2C):   50.2 ml 20.42 ml/m  RA Volume:   36.35 ml  14.79 ml/m LA Vol (A4C):   74.2 ml 30.19 ml/m LA Biplane Vol: 74.9 ml 30.47 ml/m  AORTIC VALVE AV Area (Vmax):    2.79 cm AV Area (Vmean):   2.82 cm AV Area (VTI):     3.39 cm AV Vmax:           181.00 cm/s AV Vmean:          127.000 cm/s AV VTI:            0.261 m AV Peak Grad:      13.1 mmHg AV Mean Grad:      7.0 mmHg LVOT Vmax:         161.00 cm/s LVOT Vmean:        114.000 cm/s LVOT VTI:          0.282 m LVOT/AV VTI ratio: 1.08  AORTA Ao Root diam: 3.10 cm Ao Asc diam:  3.20 cm MITRAL VALVE                TRICUSPID VALVE MV Area (PHT): 5.97 cm     TR Peak grad:   37.9 mmHg MV Area VTI:   2.19 cm     TR Vmax:        308.00 cm/s MV Peak grad:  18.1 mmHg MV Mean grad:  8.0 mmHg     SHUNTS MV Vmax:       2.13 m/s     Systemic VTI:  0.28 m MV Vmean:      132.0 cm/s   Systemic Diam: 2.00 cm MV Decel Time: 127 msec MV E velocity: 158.00 cm/s MV A velocity: 190.00 cm/s MV E/A ratio:  0.83 Buford Dresser MD Electronically signed by Buford Dresser MD Signature Date/Time: 09/22/2021/4:56:35 PM    Final    US Abdomen Limited  RUQ (LIVER/GB)  Result Date: 09/22/2021 CLINICAL DATA:  Hepatic cirrhosis. EXAM: ULTRASOUND ABDOMEN LIMITED RIGHT UPPER QUADRANT COMPARISON:  CT of September 21, 2021. FINDINGS: Gallbladder: Status post cholecystectomy. Common bile duct: Diameter: 6 mm which is within normal limits. Liver: Slightly nodular hepatic  margins are noted suggesting hepatic cirrhosis. Heterogeneous echotexture of hepatic parenchyma is noted. No definite focal sonographic hepatic abnormality is noted. Portal vein is patent on color Doppler imaging with normal direction of blood flow towards the liver. Other: None. IMPRESSION: Status post cholecystectomy. Findings consistent with hepatic cirrhosis. No definite focal sonographic hepatic abnormality is noted, although imaging is somewhat limited due to body habitus. Electronically Signed   By: Marijo Conception M.D.   On: 09/22/2021 11:21    Labs  HIV NR Hep NR Ferritin 72 on 4/14 Retic count ranges between 4-5% TSH 2.471 ANA negative DAT neg PNH neg Complement C4 11, low Ig A 545, otherwise normal Cold agglutinin neg Ferritin dropped to 25 on 4/28 B12 levels 697 SPEP, not detected  CBC from today reviewed white blood cell count at 6.7, hemoglobin of 11.6, hematocrit of 35 and platelet count at 94 CMP with AST of 58 and alk phos of 133 Ammonia of 62.  All questions were answered. The patient knows to call the clinic with any problems, questions or concerns. I spent 30 minutes in the care of this patient including H and P, review of records, counseling and coordination of care.    Benay Pike, MD 10/14/2021 3:09 PM

## 2021-10-14 NOTE — Telephone Encounter (Signed)
Per Dr. Chryl Heck - patient does not require blood transfusion today. Infusion aware.

## 2021-10-23 ENCOUNTER — Inpatient Hospital Stay: Payer: Medicare Other

## 2021-10-29 ENCOUNTER — Encounter: Payer: Self-pay | Admitting: Family Medicine

## 2021-10-29 ENCOUNTER — Encounter (HOSPITAL_COMMUNITY): Payer: Self-pay | Admitting: Gastroenterology

## 2021-10-29 ENCOUNTER — Other Ambulatory Visit: Payer: Self-pay | Admitting: *Deleted

## 2021-10-29 ENCOUNTER — Inpatient Hospital Stay: Payer: Medicare Other

## 2021-10-29 ENCOUNTER — Inpatient Hospital Stay: Payer: Medicare Other | Attending: Hematology and Oncology | Admitting: Hematology and Oncology

## 2021-10-29 ENCOUNTER — Other Ambulatory Visit: Payer: Self-pay

## 2021-10-29 VITALS — BP 123/48 | HR 96 | Temp 97.5°F | Resp 19 | Ht 66.0 in | Wt 290.5 lb

## 2021-10-29 DIAGNOSIS — G9340 Encephalopathy, unspecified: Secondary | ICD-10-CM | POA: Insufficient documentation

## 2021-10-29 DIAGNOSIS — D599 Acquired hemolytic anemia, unspecified: Secondary | ICD-10-CM

## 2021-10-29 DIAGNOSIS — Z79899 Other long term (current) drug therapy: Secondary | ICD-10-CM | POA: Insufficient documentation

## 2021-10-29 DIAGNOSIS — D696 Thrombocytopenia, unspecified: Secondary | ICD-10-CM

## 2021-10-29 DIAGNOSIS — Z87891 Personal history of nicotine dependence: Secondary | ICD-10-CM | POA: Diagnosis not present

## 2021-10-29 DIAGNOSIS — K219 Gastro-esophageal reflux disease without esophagitis: Secondary | ICD-10-CM | POA: Diagnosis not present

## 2021-10-29 DIAGNOSIS — G8929 Other chronic pain: Secondary | ICD-10-CM | POA: Diagnosis not present

## 2021-10-29 DIAGNOSIS — K7469 Other cirrhosis of liver: Secondary | ICD-10-CM

## 2021-10-29 DIAGNOSIS — R718 Other abnormality of red blood cells: Secondary | ICD-10-CM

## 2021-10-29 DIAGNOSIS — K7460 Unspecified cirrhosis of liver: Secondary | ICD-10-CM | POA: Diagnosis not present

## 2021-10-29 DIAGNOSIS — D508 Other iron deficiency anemias: Secondary | ICD-10-CM

## 2021-10-29 DIAGNOSIS — R000 Tachycardia, unspecified: Secondary | ICD-10-CM

## 2021-10-29 LAB — IRON AND IRON BINDING CAPACITY (CC-WL,HP ONLY)
Iron: 86 ug/dL (ref 28–170)
Saturation Ratios: 26 % (ref 10.4–31.8)
TIBC: 328 ug/dL (ref 250–450)
UIBC: 242 ug/dL

## 2021-10-29 LAB — CMP (CANCER CENTER ONLY)
ALT: 34 U/L (ref 0–44)
AST: 64 U/L — ABNORMAL HIGH (ref 15–41)
Albumin: 4.4 g/dL (ref 3.5–5.0)
Alkaline Phosphatase: 116 U/L (ref 38–126)
Anion gap: 10 (ref 5–15)
BUN: 22 mg/dL (ref 8–23)
CO2: 26 mmol/L (ref 22–32)
Calcium: 10.1 mg/dL (ref 8.9–10.3)
Chloride: 105 mmol/L (ref 98–111)
Creatinine: 1.24 mg/dL — ABNORMAL HIGH (ref 0.44–1.00)
GFR, Estimated: 49 mL/min — ABNORMAL LOW (ref >60)
Glucose, Bld: 92 mg/dL (ref 70–99)
Potassium: 4.1 mmol/L (ref 3.5–5.1)
Sodium: 141 mmol/L (ref 135–145)
Total Bilirubin: 1.2 mg/dL (ref 0.3–1.2)
Total Protein: 8.5 g/dL — ABNORMAL HIGH (ref 6.5–8.1)

## 2021-10-29 LAB — CBC WITH DIFFERENTIAL/PLATELET
Abs Immature Granulocytes: 0.01 10*3/uL (ref 0.00–0.07)
Basophils Absolute: 0.1 10*3/uL (ref 0.0–0.1)
Basophils Relative: 1 %
Eosinophils Absolute: 0.2 10*3/uL (ref 0.0–0.5)
Eosinophils Relative: 3 %
HCT: 41.2 % (ref 36.0–46.0)
Hemoglobin: 13.8 g/dL (ref 12.0–15.0)
Immature Granulocytes: 0 %
Lymphocytes Relative: 33 %
Lymphs Abs: 2.3 10*3/uL (ref 0.7–4.0)
MCH: 31.7 pg (ref 26.0–34.0)
MCHC: 33.5 g/dL (ref 30.0–36.0)
MCV: 94.5 fL (ref 80.0–100.0)
Monocytes Absolute: 0.4 10*3/uL (ref 0.1–1.0)
Monocytes Relative: 6 %
Neutro Abs: 3.9 10*3/uL (ref 1.7–7.7)
Neutrophils Relative %: 57 %
Platelets: 113 10*3/uL — ABNORMAL LOW (ref 150–400)
RBC: 4.36 MIL/uL (ref 3.87–5.11)
RDW: 16.6 % — ABNORMAL HIGH (ref 11.5–15.5)
WBC: 6.9 10*3/uL (ref 4.0–10.5)
nRBC: 0 % (ref 0.0–0.2)

## 2021-10-29 LAB — RETICULOCYTES
Immature Retic Fract: 9.6 % (ref 2.3–15.9)
RBC.: 4.27 MIL/uL (ref 3.87–5.11)
Retic Count, Absolute: 61.1 10*3/uL (ref 19.0–186.0)
Retic Ct Pct: 1.4 % (ref 0.4–3.1)

## 2021-10-29 LAB — LACTATE DEHYDROGENASE: LDH: 183 U/L (ref 98–192)

## 2021-10-29 LAB — FERRITIN: Ferritin: 127 ng/mL (ref 11–307)

## 2021-10-29 LAB — AMMONIA: Ammonia: 63 umol/L — ABNORMAL HIGH (ref 9–35)

## 2021-10-29 LAB — TSH: TSH: 1.551 u[IU]/mL (ref 0.350–4.500)

## 2021-10-29 LAB — SAMPLE TO BLOOD BANK

## 2021-10-29 NOTE — Progress Notes (Signed)
[place] CONSULT NOTE  Patient Care Team: Tawnya Crook, MD as PCP - General (Family Medicine)  CHIEF COMPLAINTS/PURPOSE OF CONSULTATION:  Thrombocytopenia.  ASSESSMENT & PLAN:   This is a pleasant 61 year old female patient with past medical history significant for neuropathy anxiety/depression referred to hematology for evaluation of progressive thrombocytopenia.    On evaluation of labs, she was found to have progressive anemia, nonimmune mediated hemolysis with reticulocyte count around 5%.  No elevated LDH or total bilirubin.  DAT test is negative, there appears to be some complement consumption.  Given no clear etiology for the nonimmune mediated hemolytic anemia, recommended imaging which showed hepatic cirrhosis and splenomegaly, diffuse thickening of the stomach concerning for infiltrative diseases such as lymphoma or inflammation.   BMB showed hypercellular marrow with erythroid hyperplasia, morphologic features do not provide a precise explanation for the patient cytopenias. Endoscopy and colonoscopy with evidence of obvious pathology,  She is currently being treated for decompensated cirrhosis. Anemia has resolved since hospitalization. She was seen by hepatology at [facility], recommendation is to consider ongoing weight loss and continue medication for decompensated cirrhosis We will repeat CBC, CMP, LDH and reticulocyte count today.  If she has stable anemia, we can stretch the visit to 6 weeks.  She was however encouraged to contact us sooner with any new questions or concerns.  HISTORY OF PRESENTING ILLNESS:  Theresa Moran 61 y.o. female is here because of thrombocytopenia  This is a very pleasant 61 year old female patient with history of thrombocytopenia referred to hematology for evaluation and recommendations.   Since last visit, she went to see the hepatologist.  She continues on treatment for decompensated cirrhosis BLE swelling improving. Encephalopathy  is intermittent. No hematochezia or melena.  Rest of the pertinent 10 point ROS reviewed and negative  MEDICAL HISTORY:  Past Medical History:  Diagnosis Date   Allergy    Anxiety    Asthma    in the past   Chronic pain    Depression    Gallstones    GERD (gastroesophageal reflux disease)    Headache    stopped after menopause   Neuromuscular disorder ([place])    Urticaria     SURGICAL HISTORY: Past Surgical History:  Procedure Laterality Date   APPENDECTOMY  1979   BIOPSY  09/25/2021   Procedure: BIOPSY;  Surgeon: Yetta Flock, MD;  Location: [place];  Service: Gastroenterology;;   BREAST REDUCTION SURGERY     BREAST SURGERY  1980   breast reduction   CHOLECYSTECTOMY     COLONOSCOPY WITH PROPOFOL N/A 09/25/2021   Procedure: COLONOSCOPY WITH PROPOFOL;  Surgeon: Yetta Flock, MD;  Location: [place];  Service: Gastroenterology;  Laterality: N/A;   ESOPHAGOGASTRODUODENOSCOPY N/A 09/25/2021   Procedure: ESOPHAGOGASTRODUODENOSCOPY (EGD);  Surgeon: Yetta Flock, MD;  Location: [facility] ENDOSCOPY;  Service: Gastroenterology;  Laterality: N/A;   NASAL SEPTUM SURGERY     ORIF ANKLE FRACTURE Left 03/26/2021   Procedure: OPEN REDUCTION INTERNAL FIXATION (ORIF) ANKLE FRACTURE;  Surgeon: Armond Hang, MD;  Location: [place];  Service: Orthopedics;  Laterality: Left;   POLYPECTOMY  09/25/2021   Procedure: POLYPECTOMY;  Surgeon: Yetta Flock, MD;  Location: [facility] ENDOSCOPY;  Service: Gastroenterology;;    SOCIAL HISTORY: Social History   Socioeconomic History   Marital status: Married    Spouse name: Not on file   Number of children: Not on file   Years of education: Not on file   Highest education level: Not on  file  Occupational History   Not on file  Tobacco Use   Smoking status: Former    Packs/day: 1.00    Years: 15.00    Pack years: 15.00    Types: Cigarettes    Quit date: 01/2004    Years since quitting: 17.7   Smokeless tobacco: Never   Vaping Use   Vaping Use: Never used  Substance and Sexual Activity   Alcohol use: Not Currently   Drug use: No   Sexual activity: Not Currently  Other Topics Concern   Not on file  Social History Narrative   Has 1 child(artificial insem), 2 adopted, and 1 step from partner's former partner   Raising 4 grandchildren.   Social Determinants of Health   Financial Resource Strain: Not on file  Food Insecurity: Not on file  Transportation Needs: Not on file  Physical Activity: Not on file  Stress: Not on file  Social Connections: Not on file  Intimate Partner Violence: Not on file    FAMILY HISTORY: Family History  Problem Relation Age of Onset   Intellectual disability Mother    Hypertension Mother    Hyperlipidemia Mother    Depression Mother    Arthritis Mother    Mental illness Mother    Heart disease Father    Diabetes Father    Stroke Father    Heart disease Brother    COPD Brother    Mental illness Daughter    Learning disabilities Daughter    Diabetes Daughter    Allergic rhinitis Neg Hx    Angioedema Neg Hx    Asthma Neg Hx    Eczema Neg Hx    Immunodeficiency Neg Hx    Urticaria Neg Hx    Colon cancer Neg Hx    Pancreatic cancer Neg Hx    Liver cancer Neg Hx    Stomach cancer Neg Hx     ALLERGIES:  is allergic to shellfish allergy and shellfish-derived products.  MEDICATIONS:  Current Outpatient Medications  Medication Sig Dispense Refill   lactulose (CHRONULAC) 10 GM/15ML solution Take 30 mLs by mouth 3 (three) times daily.     busPIRone (BUSPAR) 10 MG tablet Take 1 tablet (10 mg total) by mouth 2 (two) times daily. 60 tablet 0   carvedilol (COREG) 3.125 MG tablet Take 1 tablet (3.125 mg total) by mouth 2 (two) times daily with a meal. 180 tablet 1   ferrous sulfate 325 (65 FE) MG EC tablet Take 1 tablet (325 mg total) by mouth 2 (two) times daily. 60 tablet 2   furosemide (LASIX) 20 MG tablet Take 1 tablet (20 mg total) by mouth daily. 90 tablet 1    gabapentin (NEURONTIN) 600 MG tablet Take 600 mg by mouth 2 (two) times daily.     midodrine (PROAMATINE) 5 MG tablet Take 1 tablet (5 mg total) by mouth 3 (three) times daily with meals. 270 tablet 1   nortriptyline (PAMELOR) 50 MG capsule Take 100 mg by mouth at bedtime.     NUCYNTA ER 50 MG 12 hr tablet Take 50 mg by mouth daily.     pantoprazole (PROTONIX) 40 MG tablet Take 1 tablet (40 mg total) by mouth daily. 30 tablet 3   spironolactone (ALDACTONE) 25 MG tablet Take 1 tablet (25 mg total) by mouth daily. 90 tablet 1   No current facility-administered medications for this visit.     PHYSICAL EXAMINATION: ECOG PERFORMANCE STATUS: 0 - Asymptomatic  Vitals:   10/29/21 1329  BP: (!) 123/48  Pulse: 96  Resp: 19  Temp: (!) 97.5 F (36.4 C)  SpO2: 96%    Filed Weights   10/29/21 1329  Weight: 290 lb 8 oz (131.8 kg)    General appearance: Alert oriented and in no acute distress. Chest: Clear to auscultation bilaterally Heart: Rate and rhythm regular Lower extremity swelling has signifcantly improved compared to last visit.  LABORATORY DATA:  I have reviewed the data as listed Lab Results  Component Value Date   WBC 6.7 10/14/2021   HGB 11.6 (L) 10/14/2021   HCT 35.3 (L) 10/14/2021   MCV 94.4 10/14/2021   PLT 94 (L) 10/14/2021     Chemistry      Component Value Date/Time   NA 140 10/14/2021 1318   NA 141 12/18/2019 1506   K 3.8 10/14/2021 1318   CL 106 10/14/2021 1318   CO2 30 10/14/2021 1318   BUN 14 10/14/2021 1318   BUN 16 12/18/2019 1506   CREATININE 0.93 10/14/2021 1318   CREATININE 1.09 (H) 12/24/2015 1649   GLU 138 (H) 09/19/2021 0920      Component Value Date/Time   CALCIUM 9.5 10/14/2021 1318   ALKPHOS 133 (H) 10/14/2021 1318   AST 58 (H) 10/14/2021 1318   ALT 31 10/14/2021 1318   BILITOT 1.0 10/14/2021 1318   BILITOT 0.4 12/18/2019 1506     I reviewed the labs today with the patient.  No indication for blood transfusion today. Hemoglobin  today at 8.6.  Slowly progressive nonimmune mediated hemolysis.  Hemoglobin at baseline was around 14 g/dL.  Mild thrombocytopenia with platelet count ranging from 100 220,000. PNH panel normal DAT negative SPEP without any evidence of myeloma. Complete complement consumption noted Cold agglutinin titer pending. Immunoglobulin levels are unremarkable except for elevated IgA.  RADIOGRAPHIC STUDIES: I have personally reviewed the radiological images as listed and agreed with the findings in the report. No results found.  Labs  HIV NR Hep NR Ferritin 72 on 4/14 Retic count ranges between 4-5% TSH 2.471 ANA negative DAT neg PNH neg Complement C4 11, low Ig A 545, otherwise normal Cold agglutinin neg Ferritin dropped to 25 on 4/28 B12 levels 697 SPEP, not detected  CBC from today reviewed white blood cell count at 6.7, hemoglobin of 11.6, hematocrit of 35 and platelet count at 94 CMP with AST of 58 and alk phos of 133 Ammonia of 62.  Labs from today are pending  All questions were answered. The patient knows to call the clinic with any problems, questions or concerns. I spent 30 minutes in the care of this patient including H and P, review of records, counseling and coordination of care.    Theresa Pike, MD 10/29/2021 1:39 PM

## 2021-10-29 NOTE — Progress Notes (Signed)
Patient's Hgb 13.8 today, no blood needed per Dr. Chryl Heck.

## 2021-10-30 ENCOUNTER — Ambulatory Visit: Payer: Medicare Other | Admitting: Gastroenterology

## 2021-10-30 ENCOUNTER — Telehealth: Payer: Self-pay | Admitting: Hematology and Oncology

## 2021-10-30 NOTE — Telephone Encounter (Signed)
Scheduled appointment per 6/7 los. Patient is aware. 

## 2021-10-31 ENCOUNTER — Telehealth: Payer: Self-pay | Admitting: *Deleted

## 2021-10-31 NOTE — Telephone Encounter (Addendum)
-----   Message from Benay Pike, MD sent at 10/29/2021  3:53 PM EDT ----- Can you convey the results to her. Normal hemoglobin, platelets are better. No hemolysis. Her ammonia is still high. She said she already talked to her hepatologist about the lactulose titration, but may be just let her know.  Discussed with pt who states she will follow up with liver MD.

## 2021-11-06 ENCOUNTER — Encounter: Payer: Self-pay | Admitting: Family Medicine

## 2021-11-06 ENCOUNTER — Ambulatory Visit (INDEPENDENT_AMBULATORY_CARE_PROVIDER_SITE_OTHER): Payer: Medicare Other | Admitting: Family Medicine

## 2021-11-06 VITALS — BP 101/58 | HR 86 | Temp 98.2°F | Ht 66.0 in | Wt 290.0 lb

## 2021-11-06 DIAGNOSIS — R739 Hyperglycemia, unspecified: Secondary | ICD-10-CM

## 2021-11-06 DIAGNOSIS — K7469 Other cirrhosis of liver: Secondary | ICD-10-CM | POA: Diagnosis not present

## 2021-11-06 DIAGNOSIS — D508 Other iron deficiency anemias: Secondary | ICD-10-CM | POA: Diagnosis not present

## 2021-11-06 DIAGNOSIS — Z6841 Body Mass Index (BMI) 40.0 and over, adult: Secondary | ICD-10-CM

## 2021-11-06 NOTE — Patient Instructions (Signed)
It was very nice to see you today!  Check that Theresa Moran is '5mg'$  Keep up the great work.    PLEASE NOTE:  If you had any lab tests please let us know if you have not heard back within a few days. You may see your results on MyChart before we have a chance to review them but we will give you a call once they are reviewed by [place]. If we ordered any referrals today, please let us know if you have not heard from their office within the next week.   Please try these tips to maintain a healthy lifestyle:  Eat most of your calories during the day when you are active. Eliminate processed foods including packaged sweets (pies, cakes, cookies), reduce intake of potatoes, white bread, white pasta, and white rice. Look for whole grain options, oat flour or almond flour.  Each meal should contain half fruits/vegetables, one quarter protein, and one quarter carbs (no bigger than a computer mouse).  Cut down on sweet beverages. This includes juice, soda, and sweet tea. Also watch fruit intake, though this is a healthier sweet option, it still contains natural sugar! Limit to 3 servings daily.  Drink at least 1 glass of water with each meal and aim for at least 8 glasses per day  Exercise at least 150 minutes every week.

## 2021-11-06 NOTE — Progress Notes (Signed)
Subjective:     Patient ID: Theresa Moran, female    DOB: [birth year], 61 y.o.   MRN: [id number]  Chief Complaint  Patient presents with   Follow-up    Hyperglycemia    HPI-here w/wife Lindy  Hyperglycemia-working on TLC.  On Mounjaro 2.'5mg'$  x 3 wks ang doing well.   NAFLD/cirrhosis-seeing GI.  Working on [employer].  On mult meds.  Did have admission 5/20 for hepatic encephalopathy.  Saw xplant team/liver.  Still having some fogginess at times.    Hemolytic anemia-etiol unk.  ? All from cirrhoisis. Being managed by heme.  Last Hgb 6/7 13.8.  plts 113  Health Maintenance Due  Topic Date Due   Zoster Vaccines- Shingrix (1 of 2) Never done   PAP SMEAR-Modifier  03/22/2021    Past Medical History:  Diagnosis Date   Allergy    Anxiety    Asthma    in the past   Chronic pain    Depression    Gallstones    GERD (gastroesophageal reflux disease)    Headache    stopped after menopause   Neuromuscular disorder ([facility])    Urticaria     Past Surgical History:  Procedure Laterality Date   APPENDECTOMY  1979   BIOPSY  09/25/2021   Procedure: BIOPSY;  Surgeon: Yetta Flock, MD;  Location: [place];  Service: Gastroenterology;;   BREAST REDUCTION SURGERY     BREAST SURGERY  1980   breast reduction   CHOLECYSTECTOMY     COLONOSCOPY WITH PROPOFOL N/A 09/25/2021   Procedure: COLONOSCOPY WITH PROPOFOL;  Surgeon: Yetta Flock, MD;  Location: [place];  Service: Gastroenterology;  Laterality: N/A;   ESOPHAGOGASTRODUODENOSCOPY N/A 09/25/2021   Procedure: ESOPHAGOGASTRODUODENOSCOPY (EGD);  Surgeon: Yetta Flock, MD;  Location: [facility] ENDOSCOPY;  Service: Gastroenterology;  Laterality: N/A;   NASAL SEPTUM SURGERY     ORIF ANKLE FRACTURE Left 03/26/2021   Procedure: OPEN REDUCTION INTERNAL FIXATION (ORIF) ANKLE FRACTURE;  Surgeon: Armond Hang, MD;  Location: [place];  Service: Orthopedics;  Laterality: Left;   POLYPECTOMY  09/25/2021   Procedure: POLYPECTOMY;  Surgeon:  Yetta Flock, MD;  Location: [facility] ENDOSCOPY;  Service: Gastroenterology;;    Outpatient Medications Prior to Visit  Medication Sig Dispense Refill   baclofen (LIORESAL) 20 MG tablet Take 20 mg by mouth 2 (two) times daily.     busPIRone (BUSPAR) 10 MG tablet Take 1 tablet (10 mg total) by mouth 2 (two) times daily. 60 tablet 0   carvedilol (COREG) 3.125 MG tablet Take 1 tablet (3.125 mg total) by mouth 2 (two) times daily with a meal. 180 tablet 1   EPINEPHrine 0.3 mg/0.3 mL IJ SOAJ injection Inject into the muscle.     ferrous sulfate 325 (65 FE) MG EC tablet Take 1 tablet (325 mg total) by mouth 2 (two) times daily. 60 tablet 2   furosemide (LASIX) 20 MG tablet Take 1 tablet (20 mg total) by mouth daily. 90 tablet 1   gabapentin (NEURONTIN) 600 MG tablet Take 600 mg by mouth 2 (two) times daily.     hepatitis b vaccine (HEPLISAV-B) injection Inject into the muscle.     lactulose (CHRONULAC) 10 GM/15ML solution Take 30 mLs by mouth 3 (three) times daily.     midodrine (PROAMATINE) 5 MG tablet Take 1 tablet (5 mg total) by mouth 3 (three) times daily with meals. 270 tablet 1   MOUNJARO 2.5 MG/0.5ML Pen Inject into the skin.  nortriptyline (PAMELOR) 50 MG capsule Take 100 mg by mouth at bedtime.     NUCYNTA ER 50 MG 12 hr tablet Take 50 mg by mouth daily.     pantoprazole (PROTONIX) 40 MG tablet Take 1 tablet (40 mg total) by mouth daily. 30 tablet 3   rifaximin (XIFAXAN) 550 MG TABS tablet Take by mouth.     spironolactone (ALDACTONE) 25 MG tablet Take 1 tablet (25 mg total) by mouth daily. 90 tablet 1   No facility-administered medications prior to visit.    Allergies  Allergen Reactions   Shellfish Allergy Anaphylaxis, Hives, Itching, Shortness Of Breath and Swelling   Shellfish-Derived Products    ROS neg/noncontributory except as noted HPI/below Slipped in tub yesterday and jammed toe. Occ dizziness.  Occ fogginess.  Getting off Nucynta in 1 wk and then nortriptylline.        Objective:     BP (!) 101/58 (BP Location: Left Arm, Patient Position: Sitting, Cuff Size: Large)   Pulse 86   Temp 98.2 F (36.8 C) (Temporal)   Ht '5\' 6"'$  (1.676 m)   Wt 290 lb (131.5 kg)   SpO2 94%   BMI 46.81 kg/m  Wt Readings from Last 3 Encounters:  11/06/21 290 lb (131.5 kg)  10/29/21 290 lb 8 oz (131.8 kg)  10/14/21 298 lb 8 oz (135.4 kg)    Physical Exam   Gen: WDWN NAD MOWF-looks great today. HEENT: NCAT, conjunctiva not injected, sclera nonicteric NECK:  supple, no thyromegaly, no nodes, no carotid bruits CARDIAC: RRR, S1S2+, no murmur. DP 2+B LUNGS: CTAB. No wheezes ABDOMEN:  BS+, soft, NTND, No HSM, no masses EXT:  tr edema MSK: using walker NEURO: A&O x3.  CN II-XII intact.  PSYCH: normal mood. Good eye contact  Reviewed recent labs     Assessment & Plan:   Problem List Items Addressed This Visit       Digestive   Cirrhosis of liver ([place])     Other   IDA (iron deficiency anemia)   Other Visit Diagnoses     Hyperglycemia    -  Primary   Morbid obesity with BMI of 40.0-44.9, adult ([place])       Relevant Medications   MOUNJARO 2.5 MG/0.5ML Pen      Hyperglycemia-resolved.  Cont TLC Cirrhosis of liver-thought to be secondary to NAFLD-currently stable on meds.  Working on [employer] . Mounjaro helping.  Pt will make sure has the '5mg'$  dose.  Seeing GI.  Weaning off meds like Nucynta and nortiptylline. Anemia-s/p xfusions PRBC and iron.  Seeing heme.  Now improved.  Plts still low which may be splenic sequestration.  May all have been d/t acute liver failure. Has close f/u w/heme.  F/u 3 nmo  No orders of the defined types were placed in this encounter.   Wellington Hampshire, MD

## 2021-11-11 ENCOUNTER — Ambulatory Visit (INDEPENDENT_AMBULATORY_CARE_PROVIDER_SITE_OTHER): Payer: Medicare Other

## 2021-11-11 DIAGNOSIS — Z0000 Encounter for general adult medical examination without abnormal findings: Secondary | ICD-10-CM

## 2021-11-11 NOTE — Progress Notes (Signed)
Virtual Visit via Telephone Note  I connected with  Theresa Moran on 11/11/21 at 11:45 AM EDT by telephone and verified that I am speaking with the correct person using two identifiers.  Medicare Annual Wellness visit completed telephonically due to Covid-19 pandemic.   Persons participating in this call: This Health Coach and this patient.   Location: Patient: home Provider: office   I discussed the limitations, risks, security and privacy concerns of performing an evaluation and management service by telephone and the availability of in person appointments. The patient expressed understanding and agreed to proceed.  Unable to perform video visit due to video visit attempted and failed and/or patient does not have video capability.   Some vital signs may be absent or patient reported.   Theresa Brace, LPN   Subjective:   Theresa Moran is a 61 y.o. female who presents for Medicare Annual (Subsequent) preventive examination.  Review of Systems     Cardiac Risk Factors include: advanced age (>76mn, >>76women);obesity (BMI >30kg/m2)     Objective:    There were no vitals filed for this visit. There is no height or weight on file to calculate BMI.     11/11/2021   11:38 AM 09/25/2021    7:21 AM 09/21/2021    5:40 PM 09/15/2021    7:00 AM 09/04/2021   12:00 PM 03/27/2021    3:55 PM 03/27/2021    3:42 PM  Advanced Directives  Does Patient Have a Medical Advance Directive? Yes No No No No  No  Does patient want to make changes to medical advance directive? Yes (MAU/Ambulatory/Procedural Areas - Information given)        Would patient like information on creating a medical advance directive?   No - Patient declined  Yes (Inpatient - patient defers creating a medical advance directive at this time - Information given) No - Patient declined     Current Medications (verified) Outpatient Encounter Medications as of 11/11/2021  Medication Sig   baclofen (LIORESAL) 20 MG tablet Take 20  mg by mouth 2 (two) times daily.   busPIRone (BUSPAR) 10 MG tablet Take 1 tablet (10 mg total) by mouth 2 (two) times daily.   carvedilol (COREG) 3.125 MG tablet Take 1 tablet (3.125 mg total) by mouth 2 (two) times daily with a meal.   EPINEPHrine 0.3 mg/0.3 mL IJ SOAJ injection Inject into the muscle.   ferrous sulfate 325 (65 FE) MG EC tablet Take 1 tablet (325 mg total) by mouth 2 (two) times daily.   furosemide (LASIX) 20 MG tablet Take 1 tablet (20 mg total) by mouth daily.   gabapentin (NEURONTIN) 600 MG tablet Take 600 mg by mouth 2 (two) times daily.   lactulose (CHRONULAC) 10 GM/15ML solution Take 30 mLs by mouth 3 (three) times daily.   midodrine (PROAMATINE) 5 MG tablet Take 1 tablet (5 mg total) by mouth 3 (three) times daily with meals.   MOUNJARO 2.5 MG/0.5ML Pen Inject into the skin.   nortriptyline (PAMELOR) 50 MG capsule Take 100 mg by mouth at bedtime.   pantoprazole (PROTONIX) 40 MG tablet Take 1 tablet (40 mg total) by mouth daily.   rifaximin (XIFAXAN) 550 MG TABS tablet Take by mouth.   spironolactone (ALDACTONE) 25 MG tablet Take 1 tablet (25 mg total) by mouth daily.   hepatitis b vaccine (HEPLISAV-B) injection Inject into the muscle.   [DISCONTINUED] NUCYNTA ER 50 MG 12 hr tablet Take 50 mg by mouth daily.  No facility-administered encounter medications on file as of 11/11/2021.    Allergies (verified) Shellfish allergy and Shellfish-derived products   History: Past Medical History:  Diagnosis Date   Allergy    Anxiety    Asthma    in the past   Chronic pain    Depression    Gallstones    GERD (gastroesophageal reflux disease)    Headache    stopped after menopause   Neuromuscular disorder ([place])    Urticaria    Past Surgical History:  Procedure Laterality Date   APPENDECTOMY  1979   BIOPSY  09/25/2021   Procedure: BIOPSY;  Surgeon: Yetta Flock, MD;  Location: [place];  Service: Gastroenterology;;   BREAST REDUCTION SURGERY     BREAST  SURGERY  1980   breast reduction   CHOLECYSTECTOMY     COLONOSCOPY WITH PROPOFOL N/A 09/25/2021   Procedure: COLONOSCOPY WITH PROPOFOL;  Surgeon: Yetta Flock, MD;  Location: [place];  Service: Gastroenterology;  Laterality: N/A;   ESOPHAGOGASTRODUODENOSCOPY N/A 09/25/2021   Procedure: ESOPHAGOGASTRODUODENOSCOPY (EGD);  Surgeon: Yetta Flock, MD;  Location: [facility] ENDOSCOPY;  Service: Gastroenterology;  Laterality: N/A;   NASAL SEPTUM SURGERY     ORIF ANKLE FRACTURE Left 03/26/2021   Procedure: OPEN REDUCTION INTERNAL FIXATION (ORIF) ANKLE FRACTURE;  Surgeon: Armond Hang, MD;  Location: [place];  Service: Orthopedics;  Laterality: Left;   POLYPECTOMY  09/25/2021   Procedure: POLYPECTOMY;  Surgeon: Yetta Flock, MD;  Location[facility] ENDOSCOPY;  Service: Gastroenterology;;   Family History  Problem Relation Age of Onset   Intellectual disability Mother    Hypertension Mother    Hyperlipidemia Mother    Depression Mother    Arthritis Mother    Mental illness Mother    Heart disease Father    Diabetes Father    Stroke Father    Heart disease Brother    COPD Brother    Mental illness Daughter    Learning disabilities Daughter    Diabetes Daughter    Allergic rhinitis Neg Hx    Angioedema Neg Hx    Asthma Neg Hx    Eczema Neg Hx    Immunodeficiency Neg Hx    Urticaria Neg Hx    Colon cancer Neg Hx    Pancreatic cancer Neg Hx    Liver cancer Neg Hx    Stomach cancer Neg Hx    Social History   Socioeconomic History   Marital status: Married    Spouse name: Not on file   Number of children: Not on file   Years of education: Not on file   Highest education level: Not on file  Occupational History   Not on file  Tobacco Use   Smoking status: Former    Packs/day: 1.00    Years: 15.00    Total pack years: 15.00    Types: Cigarettes    Quit date: 01/2004    Years since quitting: 17.8   Smokeless tobacco: Never  Vaping Use   Vaping Use: Never used   Substance and Sexual Activity   Alcohol use: Not Currently   Drug use: No   Sexual activity: Not Currently  Other Topics Concern   Not on file  Social History Narrative   Has 1 child(artificial insem), 2 adopted, and 1 step from partner's former partner   Raising 4 grandchildren.   Social Determinants of Health   Financial Resource Strain: Low Risk  (11/11/2021)   Overall Financial Resource Strain (CARDIA)  Difficulty of Paying Living Expenses: Not hard at all  Food Insecurity: No Food Insecurity (11/11/2021)   Hunger Vital Sign    Worried About Running Out of Food in the Last Year: Never true    Ran Out of Food in the Last Year: Never true  Transportation Needs: No Transportation Needs (11/11/2021)   PRAPARE - Hydrologist (Medical): No    Lack of Transportation (Non-Medical): No  Physical Activity: Inactive (11/11/2021)   Exercise Vital Sign    Days of Exercise per Week: 0 days    Minutes of Exercise per Session: 0 min  Stress: No Stress Concern Present (11/11/2021)   [place]    Feeling of Stress : Not at all  Social Connections: Moderately Integrated (11/11/2021)   Social Connection and Isolation Panel [NHANES]    Frequency of Communication with Friends and Family: More than three times a week    Frequency of Social Gatherings with Friends and Family: More than three times a week    Attends Religious Services: Never    Marine scientist or Organizations: Yes    Attends Music therapist: 1 to 4 times per year    Marital Status: Married    Tobacco Counseling Counseling given: Not Answered   Clinical Intake:  Pre-visit preparation completed: Yes  Pain : No/denies pain     BMI - recorded: 46.81 Nutritional Status: BMI > 30  Obese Nutritional Risks: None Diabetes: No  How often do you need to have someone help you when you read instructions,  pamphlets, or other written materials from your doctor or pharmacy?: 1 - Never  Diabetic?no  Interpreter Needed?: No  Information entered by :: Charlott Rakes, LPN   Activities of Daily Living    11/11/2021   11:40 AM 09/21/2021    5:40 PM  In your present state of health, do you have any difficulty performing the following activities:  Hearing? 0 0  Vision? 0 0  Difficulty concentrating or making decisions? 0 0  Walking or climbing stairs? 1 0  Comment slowly   Dressing or bathing? 0 0  Doing errands, shopping? 0 0  Preparing Food and eating ? N   Using the Toilet? N   In the past six months, have you accidently leaked urine? N   Do you have problems with loss of bowel control? N   Managing your Medications? N   Managing your Finances? N   Housekeeping or managing your Housekeeping? N     Patient Care Team: Tawnya Crook, MD as PCP - General (Family Medicine)  Indicate any recent Medical Services you may have received from other than [facility] providers in the past year (date may be approximate).     Assessment:   This is a routine wellness examination for [place].  Hearing/Vision screen Hearing Screening - Comments:: Pt denies hearing issues  Vision Screening - Comments:: Pt follows up with Dr Idolina Primer For annual eye exams   Dietary issues and exercise activities discussed: Current Exercise Habits: The patient does not participate in regular exercise at present   Goals Addressed             This Visit's Progress    Patient Stated       Lose weight        Depression Screen    11/11/2021   11:37 AM 08/04/2021    8:50 AM 12/18/2019    2:16  PM 03/22/2018   10:02 AM 12/24/2015    3:58 PM 12/16/2014   10:43 AM  PHQ 2/9 Scores  PHQ - 2 Score 1 0 0 0 0 0  PHQ- 9 Score  0        Fall Risk    11/11/2021   11:39 AM 08/04/2021    8:51 AM 12/18/2019    2:16 PM 03/22/2018   10:02 AM 12/24/2015    3:58 PM  Fall Risk   Falls in the past year? 1 1 0 Yes No  Comment     Fell 1 month ago scrapped knee and elbow. tripped over feet.   Number falls in past yr: 1 1 0    Injury with Fall? 1 1 0    Comment fx ankle      Risk for fall due to : Impaired vision;Impaired balance/gait;Impaired mobility      Follow up Falls prevention discussed  Falls evaluation completed      FALL RISK PREVENTION PERTAINING TO THE HOME:  Any stairs in or around the home? Yes  If so, are there any without handrails? No  Home free of loose throw rugs in walkways, pet beds, electrical cords, etc? Yes  Adequate lighting in your home to reduce risk of falls? Yes   ASSISTIVE DEVICES UTILIZED TO PREVENT FALLS:  Life alert? No  Use of a cane, walker or w/c? Yes  Grab bars in the bathroom? No  Shower chair or bench in shower? Yes  Elevated toilet seat or a handicapped toilet? No   TIMED UP AND GO:  Was the test performed? No .  Cognitive Function:        11/11/2021   11:42 AM  6CIT Screen  What Year? 0 points  What month? 0 points  What time? 0 points  Count back from 20 0 points  Months in reverse 0 points  Repeat phrase 0 points  Total Score 0 points    Immunizations Immunization History  Administered Date(s) Administered   Hepatitis A, Adult 10/15/2021   Hepb-cpg 10/15/2021   Influenza Split 02/28/2013   Influenza,inj,Quad PF,6+ Mos 03/22/2018, 01/15/2020   Janssen (J&J) SARS-COV-2 Vaccination 08/29/2019   Moderna SARS-COV2 Booster Vaccination 04/28/2020   Tdap 03/22/2018    TDAP status: Up to date  Flu Vaccine status: Due, Education has been provided regarding the importance of this vaccine. Advised may receive this vaccine at local pharmacy or Health Dept. Aware to provide a copy of the vaccination record if obtained from local pharmacy or Health Dept. Verbalized acceptance and understanding.  Pneumococcal vaccine status: Due, Education has been provided regarding the importance of this vaccine. Advised may receive this vaccine at local pharmacy or Health  Dept. Aware to provide a copy of the vaccination record if obtained from local pharmacy or Health Dept. Verbalized acceptance and understanding.  Covid-19 vaccine status: Completed vaccines  Qualifies for Shingles Vaccine? Yes   Zostavax completed No   Shingrix Completed?: No.    Education has been provided regarding the importance of this vaccine. Patient has been advised to call insurance company to determine out of pocket expense if they have not yet received this vaccine. Advised may also receive vaccine at local pharmacy or Health Dept. Verbalized acceptance and understanding.  Screening Tests Health Maintenance  Topic Date Due   Zoster Vaccines- Shingrix (1 of 2) Never done   PAP SMEAR-Modifier  03/22/2021   COVID-19 Vaccine (2 - Janssen risk series) 03/17/2022 (Originally 05/26/2020)   INFLUENZA  VACCINE  12/23/2021   MAMMOGRAM  02/05/2022   TETANUS/TDAP  03/22/2028   COLONOSCOPY (Pts 45-9yr Insurance coverage will need to be confirmed)  09/26/2031   Hepatitis C Screening  Completed   HIV Screening  Completed   HPV VACCINES  Aged Out    Health Maintenance  Health Maintenance Due  Topic Date Due   Zoster Vaccines- Shingrix (1 of 2) Never done   PAP SMEAR-Modifier  03/22/2021    Colorectal cancer screening: Type of screening: Colonoscopy. Completed 09/25/21. Repeat every 10 years  Mammogram status: Completed 02/06/20. Repeat every year     Additional Screening:  Hepatitis C Screening:  Completed 09/22/21  Vision Screening: Recommended annual ophthalmology exams for early detection of glaucoma and other disorders of the eye. Is the patient up to date with their annual eye exam?  Yes  Who is the provider or what is the name of the office in which the patient attends annual eye exams? Dr DIdolina PrimerIf pt is not established with a provider, would they like to be referred to a provider to establish care? No .   Dental Screening: Recommended annual dental exams for proper oral  hygiene  Community Resource Referral / Chronic Care Management: CRR required this visit?  No   CCM required this visit?  No      Plan:     I have personally reviewed and noted the following in the patient's chart:   Medical and social history Use of alcohol, tobacco or illicit drugs  Current medications and supplements including opioid prescriptions.  Functional ability and status Nutritional status Physical activity Advanced directives List of other physicians Hospitalizations, surgeries, and ER visits in previous 12 months Vitals Screenings to include cognitive, depression, and falls Referrals and appointments  In addition, I have reviewed and discussed with patient certain preventive protocols, quality metrics, and best practice recommendations. A written personalized care plan for preventive services as well as general preventive health recommendations were provided to patient.     TWillette Brace LPN   [id number]  Nurse Notes: None

## 2021-11-11 NOTE — Patient Instructions (Signed)
Theresa Moran , Thank you for taking time to come for your Medicare Wellness Visit. I appreciate your ongoing commitment to your health goals. Please review the following plan we discussed and let me know if I can assist you in the future.   Screening recommendations/referrals: Colonoscopy: Done 09/25/21 repeat every 10 years  Mammogram: Done 02/06/20 repeat every 2 years  Recommended yearly ophthalmology/optometry visit for glaucoma screening and checkup Recommended yearly dental visit for hygiene and checkup  Vaccinations: Influenza vaccine: Due  Pneumococcal vaccine: due Tdap vaccine: Done 03/22/18 repeat every 10 years  Shingles vaccine: Shingrix discussed. Please contact your pharmacy for coverage information.    Covid-19:Completed 4/6, 04/28/20  Advanced directives: Please bring a copy of your health care power of attorney and living will to the office at your convenience.  Conditions/risks identified: lose weight   Next appointment: Follow up in one year for your annual wellness visit    Preventive Care 65 Years and Older, Female Preventive care refers to lifestyle choices and visits with your health care provider that can promote health and wellness. What does preventive care include? A yearly physical exam. This is also called an annual well check. Dental exams once or twice a year. Routine eye exams. Ask your health care provider how often you should have your eyes checked. Personal lifestyle choices, including: Daily care of your teeth and gums. Regular physical activity. Eating a healthy diet. Avoiding tobacco and drug use. Limiting alcohol use. Practicing safe sex. Taking low-dose aspirin every day. Taking vitamin and mineral supplements as recommended by your health care provider. What happens during an annual well check? The services and screenings done by your health care provider during your annual well check will depend on your age, overall health, lifestyle risk  factors, and family history of disease. Counseling  Your health care provider may ask you questions about your: Alcohol use. Tobacco use. Drug use. Emotional well-being. Home and relationship well-being. Sexual activity. Eating habits. History of falls. Memory and ability to understand (cognition). Work and work Statistician. Reproductive health. Screening  You may have the following tests or measurements: Height, weight, and BMI. Blood pressure. Lipid and cholesterol levels. These may be checked every 5 years, or more frequently if you are over 42 years old. Skin check. Lung cancer screening. You may have this screening every year starting at age 89 if you have a 30-pack-year history of smoking and currently smoke or have quit within the past 15 years. Fecal occult blood test (FOBT) of the stool. You may have this test every year starting at age 39. Flexible sigmoidoscopy or colonoscopy. You may have a sigmoidoscopy every 5 years or a colonoscopy every 10 years starting at age 18. Hepatitis C blood test. Hepatitis B blood test. Sexually transmitted disease (STD) testing. Diabetes screening. This is done by checking your blood sugar (glucose) after you have not eaten for a while (fasting). You may have this done every 1-3 years. Bone density scan. This is done to screen for osteoporosis. You may have this done starting at age 58. Mammogram. This may be done every 1-2 years. Talk to your health care provider about how often you should have regular mammograms. Talk with your health care provider about your test results, treatment options, and if necessary, the need for more tests. Vaccines  Your health care provider may recommend certain vaccines, such as: Influenza vaccine. This is recommended every year. Tetanus, diphtheria, and acellular pertussis (Tdap, Td) vaccine. You may need a Td  booster every 10 years. Zoster vaccine. You may need this after age 28. Pneumococcal 13-valent  conjugate (PCV13) vaccine. One dose is recommended after age 31. Pneumococcal polysaccharide (PPSV23) vaccine. One dose is recommended after age 48. Talk to your health care provider about which screenings and vaccines you need and how often you need them. This information is not intended to replace advice given to you by your health care provider. Make sure you discuss any questions you have with your health care provider. Document Released: 06/07/2015 Document Revised: 01/29/2016 Document Reviewed: 03/12/2015 Elsevier Interactive Patient Education  2017 [employer] Prevention in the Home Falls can cause injuries. They can happen to people of all ages. There are many things you can do to make your home safe and to help prevent falls. What can I do on the outside of my home? Regularly fix the edges of walkways and driveways and fix any cracks. Remove anything that might make you trip as you walk through a door, such as a raised step or threshold. Trim any bushes or trees on the path to your home. Use bright outdoor lighting. Clear any walking paths of anything that might make someone trip, such as rocks or tools. Regularly check to see if handrails are loose or broken. Make sure that both sides of any steps have handrails. Any raised decks and porches should have guardrails on the edges. Have any leaves, snow, or ice cleared regularly. Use sand or salt on walking paths during winter. Clean up any spills in your garage right away. This includes oil or grease spills. What can I do in the bathroom? Use night lights. Install grab bars by the toilet and in the tub and shower. Do not use towel bars as grab bars. Use non-skid mats or decals in the tub or shower. If you need to sit down in the shower, use a plastic, non-slip stool. Keep the floor dry. Clean up any water that spills on the floor as soon as it happens. Remove soap buildup in the tub or shower regularly. Attach bath mats  securely with double-sided non-slip rug tape. Do not have throw rugs and other things on the floor that can make you trip. What can I do in the bedroom? Use night lights. Make sure that you have a light by your bed that is easy to reach. Do not use any sheets or blankets that are too big for your bed. They should not hang down onto the floor. Have a firm chair that has side arms. You can use this for support while you get dressed. Do not have throw rugs and other things on the floor that can make you trip. What can I do in the kitchen? Clean up any spills right away. Avoid walking on wet floors. Keep items that you use a lot in easy-to-reach places. If you need to reach something above you, use a strong step stool that has a grab bar. Keep electrical cords out of the way. Do not use floor polish or wax that makes floors slippery. If you must use wax, use non-skid floor wax. Do not have throw rugs and other things on the floor that can make you trip. What can I do with my stairs? Do not leave any items on the stairs. Make sure that there are handrails on both sides of the stairs and use them. Fix handrails that are broken or loose. Make sure that handrails are as long as the stairways. Check any carpeting  to make sure that it is firmly attached to the stairs. Fix any carpet that is loose or worn. Avoid having throw rugs at the top or bottom of the stairs. If you do have throw rugs, attach them to the floor with carpet tape. Make sure that you have a light switch at the top of the stairs and the bottom of the stairs. If you do not have them, ask someone to add them for you. What else can I do to help prevent falls? Wear shoes that: Do not have high heels. Have rubber bottoms. Are comfortable and fit you well. Are closed at the toe. Do not wear sandals. If you use a stepladder: Make sure that it is fully opened. Do not climb a closed stepladder. Make sure that both sides of the stepladder  are locked into place. Ask someone to hold it for you, if possible. Clearly mark and make sure that you can see: Any grab bars or handrails. First and last steps. Where the edge of each step is. Use tools that help you move around (mobility aids) if they are needed. These include: Canes. Walkers. Scooters. Crutches. Turn on the lights when you go into a dark area. Replace any light bulbs as soon as they burn out. Set up your furniture so you have a clear path. Avoid moving your furniture around. If any of your floors are uneven, fix them. If there are any pets around you, be aware of where they are. Review your medicines with your doctor. Some medicines can make you feel dizzy. This can increase your chance of falling. Ask your doctor what other things that you can do to help prevent falls. This information is not intended to replace advice given to you by your health care provider. Make sure you discuss any questions you have with your health care provider. Document Released: 03/07/2009 Document Revised: 10/17/2015 Document Reviewed: 06/15/2014 Elsevier Interactive Patient Education  2017 [employer].

## 2021-11-14 ENCOUNTER — Encounter: Payer: Self-pay | Admitting: Family Medicine

## 2021-11-17 ENCOUNTER — Other Ambulatory Visit: Payer: Self-pay | Admitting: *Deleted

## 2021-11-17 DIAGNOSIS — K7469 Other cirrhosis of liver: Secondary | ICD-10-CM

## 2021-11-26 ENCOUNTER — Other Ambulatory Visit: Payer: Self-pay | Admitting: *Deleted

## 2021-11-26 ENCOUNTER — Encounter: Payer: Self-pay | Admitting: Family Medicine

## 2021-11-26 MED ORDER — TIRZEPATIDE 5 MG/0.5ML ~~LOC~~ SOAJ: 5.0000 mg | SUBCUTANEOUS | 0 refills | Status: DC

## 2021-12-04 ENCOUNTER — Ambulatory Visit (INDEPENDENT_AMBULATORY_CARE_PROVIDER_SITE_OTHER): Payer: Medicare Other

## 2021-12-04 DIAGNOSIS — Z23 Encounter for immunization: Secondary | ICD-10-CM

## 2021-12-08 ENCOUNTER — Other Ambulatory Visit: Payer: Self-pay

## 2021-12-08 ENCOUNTER — Encounter: Payer: Self-pay | Admitting: Hematology and Oncology

## 2021-12-08 DIAGNOSIS — D696 Thrombocytopenia, unspecified: Secondary | ICD-10-CM

## 2021-12-08 DIAGNOSIS — D599 Acquired hemolytic anemia, unspecified: Secondary | ICD-10-CM

## 2021-12-08 DIAGNOSIS — D509 Iron deficiency anemia, unspecified: Secondary | ICD-10-CM

## 2021-12-09 ENCOUNTER — Ambulatory Visit (HOSPITAL_COMMUNITY)
Admission: RE | Admit: 2021-12-09 | Discharge: 2021-12-09 | Disposition: A | Discharge status: Home / Self Care | Payer: Medicare Other | Source: Ambulatory Visit | Attending: Physician Assistant | Admitting: Physician Assistant

## 2021-12-09 ENCOUNTER — Other Ambulatory Visit: Payer: Self-pay

## 2021-12-09 ENCOUNTER — Inpatient Hospital Stay: Payer: Medicare Other | Attending: Hematology and Oncology

## 2021-12-09 ENCOUNTER — Inpatient Hospital Stay (HOSPITAL_BASED_OUTPATIENT_CLINIC_OR_DEPARTMENT_OTHER): Payer: Medicare Other | Admitting: Physician Assistant

## 2021-12-09 ENCOUNTER — Inpatient Hospital Stay: Payer: Medicare Other

## 2021-12-09 VITALS — BP 103/55 | HR 98 | Temp 98.1°F | Resp 18 | Wt 283.9 lb

## 2021-12-09 DIAGNOSIS — K7469 Other cirrhosis of liver: Secondary | ICD-10-CM | POA: Diagnosis not present

## 2021-12-09 DIAGNOSIS — K7460 Unspecified cirrhosis of liver: Secondary | ICD-10-CM | POA: Insufficient documentation

## 2021-12-09 DIAGNOSIS — Z79899 Other long term (current) drug therapy: Secondary | ICD-10-CM | POA: Insufficient documentation

## 2021-12-09 DIAGNOSIS — G8929 Other chronic pain: Secondary | ICD-10-CM | POA: Diagnosis not present

## 2021-12-09 DIAGNOSIS — D589 Hereditary hemolytic anemia, unspecified: Secondary | ICD-10-CM | POA: Insufficient documentation

## 2021-12-09 DIAGNOSIS — D599 Acquired hemolytic anemia, unspecified: Secondary | ICD-10-CM

## 2021-12-09 DIAGNOSIS — K219 Gastro-esophageal reflux disease without esophagitis: Secondary | ICD-10-CM | POA: Insufficient documentation

## 2021-12-09 DIAGNOSIS — R0602 Shortness of breath: Secondary | ICD-10-CM | POA: Insufficient documentation

## 2021-12-09 DIAGNOSIS — B370 Candidal stomatitis: Secondary | ICD-10-CM | POA: Diagnosis not present

## 2021-12-09 DIAGNOSIS — B379 Candidiasis, unspecified: Secondary | ICD-10-CM | POA: Diagnosis not present

## 2021-12-09 DIAGNOSIS — D696 Thrombocytopenia, unspecified: Secondary | ICD-10-CM | POA: Diagnosis present

## 2021-12-09 DIAGNOSIS — Z87891 Personal history of nicotine dependence: Secondary | ICD-10-CM | POA: Insufficient documentation

## 2021-12-09 DIAGNOSIS — R000 Tachycardia, unspecified: Secondary | ICD-10-CM

## 2021-12-09 DIAGNOSIS — D509 Iron deficiency anemia, unspecified: Secondary | ICD-10-CM

## 2021-12-09 LAB — CBC WITH DIFFERENTIAL/PLATELET
Abs Immature Granulocytes: 0.02 10*3/uL (ref 0.00–0.07)
Basophils Absolute: 0.1 10*3/uL (ref 0.0–0.1)
Basophils Relative: 1 %
Eosinophils Absolute: 0.2 10*3/uL (ref 0.0–0.5)
Eosinophils Relative: 2 %
HCT: 41.4 % (ref 36.0–46.0)
Hemoglobin: 14.6 g/dL (ref 12.0–15.0)
Immature Granulocytes: 0 %
Lymphocytes Relative: 16 %
Lymphs Abs: 1.6 10*3/uL (ref 0.7–4.0)
MCH: 31.8 pg (ref 26.0–34.0)
MCHC: 35.3 g/dL (ref 30.0–36.0)
MCV: 90.2 fL (ref 80.0–100.0)
Monocytes Absolute: 0.8 10*3/uL (ref 0.1–1.0)
Monocytes Relative: 9 %
Neutro Abs: 7 10*3/uL (ref 1.7–7.7)
Neutrophils Relative %: 72 %
Platelets: 183 10*3/uL (ref 150–400)
RBC: 4.59 MIL/uL (ref 3.87–5.11)
RDW: 15.7 % — ABNORMAL HIGH (ref 11.5–15.5)
WBC: 9.7 10*3/uL (ref 4.0–10.5)
nRBC: 0 % (ref 0.0–0.2)

## 2021-12-09 LAB — COMPREHENSIVE METABOLIC PANEL
ALT: 28 U/L (ref 0–44)
AST: 47 U/L — ABNORMAL HIGH (ref 15–41)
Albumin: 3.5 g/dL (ref 3.5–5.0)
Alkaline Phosphatase: 122 U/L (ref 38–126)
Anion gap: 6 (ref 5–15)
BUN: 13 mg/dL (ref 8–23)
CO2: 26 mmol/L (ref 22–32)
Calcium: 9.1 mg/dL (ref 8.9–10.3)
Chloride: 103 mmol/L (ref 98–111)
Creatinine, Ser: 0.94 mg/dL (ref 0.44–1.00)
GFR, Estimated: >60 mL/min (ref >60)
Glucose, Bld: 107 mg/dL — ABNORMAL HIGH (ref 70–99)
Potassium: 4.1 mmol/L (ref 3.5–5.1)
Sodium: 135 mmol/L (ref 135–145)
Total Bilirubin: 1.4 mg/dL — ABNORMAL HIGH (ref 0.3–1.2)
Total Protein: 7.7 g/dL (ref 6.5–8.1)

## 2021-12-09 LAB — SAMPLE TO BLOOD BANK

## 2021-12-09 LAB — RETICULOCYTES
Immature Retic Fract: 14.3 % (ref 2.3–15.9)
RBC.: 4.61 MIL/uL (ref 3.87–5.11)
Retic Count, Absolute: 76.5 10*3/uL (ref 19.0–186.0)
Retic Ct Pct: 1.7 % (ref 0.4–3.1)

## 2021-12-09 LAB — TSH: TSH: 3.081 u[IU]/mL (ref 0.350–4.500)

## 2021-12-09 MED ORDER — LIDOCAINE VISCOUS HCL 2 % MT SOLN: 5.0000 mL | Freq: Three times a day (TID) | OROMUCOSAL | 0 refills | Status: DC

## 2021-12-09 MED ORDER — NYSTATIN 100000 UNIT/ML MT SUSP: 5.0000 mL | Freq: Three times a day (TID) | OROMUCOSAL | 0 refills | Status: DC | PRN

## 2021-12-09 NOTE — Progress Notes (Signed)
Symptom Management Consult note [place]    Patient Care Team: Tawnya Crook, MD as PCP - General (Family Medicine)    Name of the patient: Theresa Moran  [id number]  1961/03/10   Date of visit: 12/09/2021    Chief complaint/ Reason for visit-shortness of breath  Oncology History   No history exists.    Current Therapy: IV Feraheme  Last treatment: 10/03/2021  Interval history- Theresa Moran is a 61 y.o. with pertinent history of thrombocytopenia as above presenting to [facility] today with chief complaint of shortness of breath x 1 week. Spouse accompanies patient to clinic today and provides additional history.  Patient is endorsing exertional dyspnea.  She notices it mostly at night when she is getting into bed.  She denies any shortness of breath at rest.  She states in the past she has felt this way when she was anemic and required a blood transfusion. She has a pain on her left side and because of this is taking shallow breaths.The pain is over her liver and she describes it as aching and rates it 5/10 in severity. She has had similar pain to when she had pneumonia back in April. She is also endorsing chills.   When she was brushing her teeth this morning she noticed her gums were bleeding and that she has a small bruise on her abdomen.  She does not remember bumping into anything to cause the bruise.  She states in the past she has had gum bleeding when her platelets were low.  She also reports feeling fatigued.  She noticed a sore under her tongue after [place] and describes mild pain when anything touches the sore. She denies any sick contacts.  She denies any new medications or over-the-counter medications taken today prior to arrival.  She denies fever, diaphoresis, congestion, cough, chest pain, palpitations, syncope, hematemesis, urinary symptoms, blood in stool.    ROS  All other systems are reviewed and are negative for  acute change except as noted in the HPI.    Allergies  Allergen Reactions   Shellfish Allergy Anaphylaxis, Hives, Itching, Shortness Of Breath and Swelling   Shellfish-Derived Products      Past Medical History:  Diagnosis Date   Allergy    Anxiety    Asthma    in the past   Chronic pain    Depression    Gallstones    GERD (gastroesophageal reflux disease)    Headache    stopped after menopause   Neuromuscular disorder ([place])    Urticaria      Past Surgical History:  Procedure Laterality Date   APPENDECTOMY  1979   BIOPSY  09/25/2021   Procedure: BIOPSY;  Surgeon: Yetta Flock, MD;  Location: [place] [facility];  Service: Gastroenterology;;   BREAST REDUCTION SURGERY     BREAST SURGERY  1980   breast reduction   CHOLECYSTECTOMY     COLONOSCOPY WITH PROPOFOL N/A 09/25/2021   Procedure: COLONOSCOPY WITH PROPOFOL;  Surgeon: Yetta Flock, MD;  Location: [place];  Service: Gastroenterology;  Laterality: N/A;   ESOPHAGOGASTRODUODENOSCOPY N/A 09/25/2021   Procedure: ESOPHAGOGASTRODUODENOSCOPY (EGD);  Surgeon: Yetta Flock, MD;  Location: [facility];  Service: Gastroenterology;  Laterality: N/A;   NASAL SEPTUM SURGERY     ORIF ANKLE FRACTURE Left 03/26/2021   Procedure: OPEN REDUCTION INTERNAL FIXATION (ORIF) ANKLE FRACTURE;  Surgeon: Armond Hang, MD;  Location: [place];  Service: Orthopedics;  Laterality: Left;   POLYPECTOMY  09/25/2021   Procedure: POLYPECTOMY;  Surgeon: Yetta Flock, MD;  Location: [facility] ENDOSCOPY;  Service: Gastroenterology;;    Social History   Socioeconomic History   Marital status: Married    Spouse name: Not on file   Number of children: Not on file   Years of education: Not on file   Highest education level: Not on file  Occupational History   Not on file  Tobacco Use   Smoking status: Former    Packs/day: 1.00    Years: 15.00    Total pack years: 15.00    Types: Cigarettes    Quit date: 01/2004    Years  since quitting: 17.8   Smokeless tobacco: Never  Vaping Use   Vaping Use: Never used  Substance and Sexual Activity   Alcohol use: Not Currently   Drug use: No   Sexual activity: Not Currently  Other Topics Concern   Not on file  Social History Narrative   Has 1 child(artificial insem), 2 adopted, and 1 step from partner's former partner   Raising 4 grandchildren.   Social Determinants of Health   Financial Resource Strain: Low Risk  (11/11/2021)   Overall Financial Resource Strain (CARDIA)    Difficulty of Paying Living Expenses: Not hard at all  Food Insecurity: No Food Insecurity (11/11/2021)   Hunger Vital Sign    Worried About Running Out of Food in the Last Year: Never true    Ran Out of Food in the Last Year: Never true  Transportation Needs: No Transportation Needs (11/11/2021)   PRAPARE - Hydrologist (Medical): No    Lack of Transportation (Non-Medical): No  Physical Activity: Inactive (11/11/2021)   Exercise Vital Sign    Days of Exercise per Week: 0 days    Minutes of Exercise per Session: 0 min  Stress: No Stress Concern Present (11/11/2021)   [place]    Feeling of Stress : Not at all  Social Connections: Moderately Integrated (11/11/2021)   Social Connection and Isolation Panel [NHANES]    Frequency of Communication with Friends and Family: More than three times a week    Frequency of Social Gatherings with Friends and Family: More than three times a week    Attends Religious Services: Never    Marine scientist or Organizations: Yes    Attends Archivist Meetings: 1 to 4 times per year    Marital Status: Married  Human resources officer Violence: Not At Risk (11/11/2021)   Humiliation, Afraid, Rape, and Kick questionnaire    Fear of Current or Ex-Partner: No    Emotionally Abused: No    Physically Abused: No    Sexually Abused: No    Family History   Problem Relation Age of Onset   Intellectual disability Mother    Hypertension Mother    Hyperlipidemia Mother    Depression Mother    Arthritis Mother    Mental illness Mother    Heart disease Father    Diabetes Father    Stroke Father    Heart disease Brother    COPD Brother    Mental illness Daughter    Learning disabilities Daughter    Diabetes Daughter    Allergic rhinitis Neg Hx    Angioedema Neg Hx    Asthma Neg Hx    Eczema Neg Hx    Immunodeficiency Neg Hx  Urticaria Neg Hx    Colon cancer Neg Hx    Pancreatic cancer Neg Hx    Liver cancer Neg Hx    Stomach cancer Neg Hx      Current Outpatient Medications:    magic mouthwash (nystatin, diphenhydrAMINE, alum & mag hydroxide) suspension mixture, Swish and spit 5 mLs 3 (three) times daily as needed for mouth pain., Disp: 240 mL, Rfl: 0   tirzepatide (MOUNJARO) 5 MG/0.5ML Pen, Inject 5 mg into the skin once a week., Disp: 6 mL, Rfl: 0   baclofen (LIORESAL) 20 MG tablet, Take 20 mg by mouth 2 (two) times daily., Disp: , Rfl:    busPIRone (BUSPAR) 10 MG tablet, Take 1 tablet (10 mg total) by mouth 2 (two) times daily., Disp: 60 tablet, Rfl: 0   carvedilol (COREG) 3.125 MG tablet, Take 1 tablet (3.125 mg total) by mouth 2 (two) times daily with a meal., Disp: 180 tablet, Rfl: 1   EPINEPHrine 0.3 mg/0.3 mL IJ SOAJ injection, Inject into the muscle., Disp: , Rfl:    ferrous sulfate 325 (65 FE) MG EC tablet, Take 1 tablet (325 mg total) by mouth 2 (two) times daily., Disp: 60 tablet, Rfl: 2   furosemide (LASIX) 20 MG tablet, Take 1 tablet (20 mg total) by mouth daily., Disp: 90 tablet, Rfl: 1   gabapentin (NEURONTIN) 600 MG tablet, Take 600 mg by mouth 2 (two) times daily., Disp: , Rfl:    lactulose (CHRONULAC) 10 GM/15ML solution, Take 30 mLs by mouth 3 (three) times daily., Disp: , Rfl:    midodrine (PROAMATINE) 5 MG tablet, Take 1 tablet (5 mg total) by mouth 3 (three) times daily with meals., Disp: 270 tablet, Rfl: 1    MOUNJARO 2.5 MG/0.5ML Pen, Inject into the skin., Disp: , Rfl:    pantoprazole (PROTONIX) 40 MG tablet, Take 1 tablet (40 mg total) by mouth daily., Disp: 30 tablet, Rfl: 3   spironolactone (ALDACTONE) 25 MG tablet, Take 1 tablet (25 mg total) by mouth daily., Disp: 90 tablet, Rfl: 1  PHYSICAL EXAM: ECOG FS:1 - Symptomatic but completely ambulatory   T: 98.1  BP: 103/55  HR: 98   Resp: 18  O2: 95% on room air Physical Exam Vitals and nursing note reviewed.  Constitutional:      Appearance: She is well-developed. She is not ill-appearing or toxic-appearing.  HENT:     Head: Normocephalic and atraumatic.     Nose: Nose normal.     Mouth/Throat:     Comments: White coating on tongue. Sore on sublingual space without abscess. Tender to palpation with peeling skin. No bleeding or drainage.  No dental abscess or other dental abnormalities. Eyes:     General: No scleral icterus.       Right eye: No discharge.        Left eye: No discharge.     Conjunctiva/sclera: Conjunctivae normal.  Neck:     Vascular: No JVD.  Cardiovascular:     Rate and Rhythm: Normal rate and regular rhythm.     Pulses: Normal pulses.     Heart sounds: Normal heart sounds.  Pulmonary:     Effort: Pulmonary effort is normal. No respiratory distress.     Breath sounds: Normal breath sounds. No stridor. No rhonchi or rales.  Chest:     Chest wall: No tenderness.  Abdominal:     General: There is no distension.     Palpations: Abdomen is soft.     Tenderness: There  is no right CVA tenderness, left CVA tenderness, guarding or rebound.     Hernia: No hernia is present.     Comments: Tenderness to deep palpation of left lobe of liver.  No peritoneal signs  Musculoskeletal:        General: Normal range of motion.     Cervical back: Normal range of motion.     Right lower leg: No edema.     Left lower leg: No edema.  Skin:    General: Skin is warm and dry.     Capillary Refill: Capillary refill takes less than 2  seconds.     Findings: Bruising (on left lower abdomen, dime size) present.  Neurological:     Mental Status: She is oriented to person, place, and time.     GCS: GCS eye subscore is 4. GCS verbal subscore is 5. GCS motor subscore is 6.     Comments: Fluent speech, no facial droop.  Psychiatric:        Behavior: Behavior normal.        LABORATORY DATA: I have reviewed the data as listed    Latest Ref Rng & Units 12/09/2021    9:23 AM 10/29/2021    1:17 PM 10/14/2021    1:18 PM  CBC  WBC 4.0 - 10.5 K/uL 9.7  6.9  6.7   Hemoglobin 12.0 - 15.0 g/dL 14.6  13.8  11.6   Hematocrit 36.0 - 46.0 % 41.4  41.2  35.3   Platelets 150 - 400 K/uL 183  113  94         Latest Ref Rng & Units 12/09/2021    9:23 AM 10/29/2021    1:28 PM 10/14/2021    1:18 PM  CMP  Glucose 70 - 99 mg/dL 107  92  97   BUN 8 - 23 mg/dL '13  22  14   '$ Creatinine 0.44 - 1.00 mg/dL 0.94  1.24  0.93   Sodium 135 - 145 mmol/L 135  141  140   Potassium 3.5 - 5.1 mmol/L 4.1  4.1  3.8   Chloride 98 - 111 mmol/L 103  105  106   CO2 22 - 32 mmol/L '26  26  30   '$ Calcium 8.9 - 10.3 mg/dL 9.1  10.1  9.5   Total Protein 6.5 - 8.1 g/dL 7.7  8.5  7.7   Total Bilirubin 0.3 - 1.2 mg/dL 1.4  1.2  1.0   Alkaline Phos 38 - 126 U/L 122  116  133   AST 15 - 41 U/L 47  64  58   ALT 0 - 44 U/L 28  34  31        RADIOGRAPHIC STUDIES (from last 24 hours if applicable) I have personally reviewed the radiological images as listed and agreed with the findings in the report. DG Chest 2 View  Result Date: 12/09/2021 CLINICAL DATA:  Shortness of breath. EXAM: CHEST - 2 VIEW COMPARISON:  Radiographs 09/21/2021.  CT 09/21/2021. FINDINGS: The heart size and mediastinal contours are stable. There is mild central airway thickening throughout the lungs with patchy bibasilar pulmonary opacities, improved from the prior examination. Remaining densities may reflect postinflammatory scarring. There is no consolidation, pneumothorax or significant  pleural effusion. Mild degenerative changes are present within the spine. IMPRESSION: Improved bibasilar pulmonary opacities compared with prior studies from 3 months ago, likely reflecting residual postinflammatory scarring and central airway thickening. No residual focal airspace disease, significant pleural effusion or definite  acute findings. Electronically Signed   By: Richardean Sale M.D.   On: 12/09/2021 11:48       ASSESSMENT & PLAN: Patient is a 61 y.o. 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 Chryl Heck.  I have viewed most recent oncology note and lab work.   #) Shortness of breath- Patient well appearing, afebrile, HDS. Patient has normal lung exam. No hypoxia with ambulation around clinic.CBC is unremarkable, HgB 14.6, platelets 183, WBC 7.8. CMP is also overall unremarkable. Patient endorses bleeding gums and unexplained bruise that started today. No indications for blood transfusion with reassuring labs and vitals. Chest xray obtained an shows no acute processes to explain symptoms.Radiologist comments on improved bibasilar pulmonary opacities compared with prior studies from 3 months ago, which could reflect residual postinflammatory scarring and central airway thickening.  I viewed image and agree with radiologist impression.  Engaged in shared decision making with patient regarding exertional dyspnea. Patient would like to continue to monitor symptoms at home and not proceed with additional imaging at this time. Discussed ddx including PE and needing CTA to r/o. She knows to call the clinic or seek emergency treatment should symptoms worsen. She plans to use incentive spirometer at home to ensure deep breathing and lung expansion.   #)Thrush- Exam consistent with thrush. No signs of infection. Will treat with magic mouthwash.  #)Cirrhosis- patient has known history of cirrhosis and is follwed by [facility]. She is changing  from the [place] to [place] office to be closer to home. She has an appointment scheduled 12/22/21. She has tenderness to RUQ, more so over left lobe of liver.  She has no peritoneal signs.  I viewed her abdomen from May 2023 which showed quality in left hepatic lobe hypertrophy and diffuse capsular nodularity consistent with cirrhosis and no hepatic masses.  CMP today shows no significant electrolyte derangement, no renal insufficiency.  AST and total bilirubin are only slightly elevated at 47 and 1.4.  Discussed possibility of imaging for further evaluation of this pain.  Patient does not feel this is necessary at this time and she will keep follow-up appointment with GI for further evaluation of her pain.  #) Nonimmune mediated hemolytic anemia and thrombocytopenia- Next appointment with Dr. Chryl Heck is 07/24.  Strict ED precautions discussed should symptoms worsen.  Visit Diagnosis: 1. Shortness of breath   2. Thrush, oral   3. Acquired hemolytic anemia (HCC)   4. Other cirrhosis of liver ([place])      Orders Placed This Encounter  Procedures   DG Chest 2 View    Standing Status:   Future    Number of Occurrences:   1    Standing Expiration Date:   12/09/2022    Order Specific Question:   Reason for Exam (SYMPTOM  OR DIAGNOSIS REQUIRED)    Answer:   sob    Order Specific Question:   Preferred imaging location?    Answer:   [facility]    All questions were answered. The patient knows to call the clinic with any problems, questions or concerns. No barriers to learning was detected.  I have spent a total of 30 minutes minutes of face-to-face and non-face-to-face time, preparing to see the patient, obtaining and/or reviewing separately obtained history, performing a medically appropriate examination, counseling and educating the patient, ordering tests, documenting clinical information in the electronic health record, and care coordination (communications with other health care  professionals or caregivers).  Thank you for allowing me to participate in the care of this patient.    Barrie Folk, PA-C Department of Hematology/Oncology [facility] Phone: [phone]  [phone]    12/09/2021 12:48 PM

## 2021-12-10 ENCOUNTER — Encounter: Payer: Self-pay | Admitting: Hematology and Oncology

## 2021-12-15 ENCOUNTER — Encounter: Payer: Self-pay | Admitting: Hematology and Oncology

## 2021-12-15 ENCOUNTER — Inpatient Hospital Stay (HOSPITAL_BASED_OUTPATIENT_CLINIC_OR_DEPARTMENT_OTHER): Payer: Medicare Other | Admitting: Hematology and Oncology

## 2021-12-15 ENCOUNTER — Other Ambulatory Visit: Payer: Self-pay

## 2021-12-15 VITALS — BP 94/49 | HR 99 | Temp 97.9°F | Resp 18 | Ht 66.0 in | Wt 280.9 lb

## 2021-12-15 DIAGNOSIS — D696 Thrombocytopenia, unspecified: Secondary | ICD-10-CM | POA: Diagnosis not present

## 2021-12-15 DIAGNOSIS — D599 Acquired hemolytic anemia, unspecified: Secondary | ICD-10-CM

## 2021-12-15 NOTE — Progress Notes (Signed)
[place] CONSULT NOTE  Patient Care Team: Tawnya Crook, MD as PCP - General (Family Medicine)  CHIEF COMPLAINTS/PURPOSE OF CONSULTATION:  Thrombocytopenia.  ASSESSMENT & PLAN:   This is a pleasant 61 year old female patient with past medical history significant for neuropathy anxiety/depression referred to hematology for evaluation of progressive thrombocytopenia.    On evaluation of labs, she was found to have progressive anemia, nonimmune mediated hemolysis with reticulocyte count around 5%.  No elevated LDH or total bilirubin.  DAT test is negative, there appears to be some complement consumption.  Given no clear etiology for the nonimmune mediated hemolytic anemia, recommended imaging which showed hepatic cirrhosis and splenomegaly, diffuse thickening of the stomach concerning for infiltrative diseases such as lymphoma or inflammation.   BMB showed hypercellular marrow with erythroid hyperplasia, morphologic features do not provide a precise explanation for the patient cytopenias. Endoscopy and colonoscopy with evidence of obvious pathology,  She is here for follow-up.  Most recent labs reviewed, resolution of anemia, no evidence of hemolysis.  No evidence of hypothyroidism.  At this time since her hematological issues have resolved, have discharged her back to her primary care physician.  She will continue to follow-up with her PCP and gastroenterology and return to hematology as needed.Marland Kitchen  HISTORY OF PRESENTING ILLNESS:  Theresa Moran 61 y.o. female is here because of thrombocytopenia  This is a very pleasant 61 year old female patient with history of thrombocytopenia referred to hematology for evaluation and recommendations.   She is doing quite well compared to last visit.  Her energy continues to improve.   She has an appointment with another hepatologist. She denies any worsening lower extremity swelling.  No other complaints for me today.  Rest of the pertinent  10 point ROS reviewed and negative  MEDICAL HISTORY:  Past Medical History:  Diagnosis Date   Allergy    Anxiety    Asthma    in the past   Chronic pain    Depression    Gallstones    GERD (gastroesophageal reflux disease)    Headache    stopped after menopause   Neuromuscular disorder ([place])    Urticaria     SURGICAL HISTORY: Past Surgical History:  Procedure Laterality Date   APPENDECTOMY  1979   BIOPSY  09/25/2021   Procedure: BIOPSY;  Surgeon: Yetta Flock, MD;  Location: [place];  Service: Gastroenterology;;   BREAST REDUCTION SURGERY     BREAST SURGERY  1980   breast reduction   CHOLECYSTECTOMY     COLONOSCOPY WITH PROPOFOL N/A 09/25/2021   Procedure: COLONOSCOPY WITH PROPOFOL;  Surgeon: Yetta Flock, MD;  Location: [place];  Service: Gastroenterology;  Laterality: N/A;   ESOPHAGOGASTRODUODENOSCOPY N/A 09/25/2021   Procedure: ESOPHAGOGASTRODUODENOSCOPY (EGD);  Surgeon: Yetta Flock, MD;  Location: [facility]COPY;  Service: Gastroenterology;  Laterality: N/A;   NASAL SEPTUM SURGERY     ORIF ANKLE FRACTURE Left 03/26/2021   Procedure: OPEN REDUCTION INTERNAL FIXATION (ORIF) ANKLE FRACTURE;  Surgeon: Armond Hang, MD;  Location: [place];  Service: Orthopedics;  Laterality: Left;   POLYPECTOMY  09/25/2021   Procedure: POLYPECTOMY;  Surgeon: Yetta Flock, MD;  Location: [facility] ENDOSCOPY;  Service: Gastroenterology;;    SOCIAL HISTORY: Social History   Socioeconomic History   Marital status: Married    Spouse name: Not on file   Number of children: Not on file   Years of education: Not on file   Highest education level: Not on file  Occupational  History   Not on file  Tobacco Use   Smoking status: Former    Packs/day: 1.00    Years: 15.00    Total pack years: 15.00    Types: Cigarettes    Quit date: 01/2004    Years since quitting: 17.9   Smokeless tobacco: Never  Vaping Use   Vaping Use: Never used  Substance and Sexual  Activity   Alcohol use: Not Currently   Drug use: No   Sexual activity: Not Currently  Other Topics Concern   Not on file  Social History Narrative   Has 1 child(artificial insem), 2 adopted, and 1 step from partner's former partner   Raising 4 grandchildren.   Social Determinants of Health   Financial Resource Strain: Low Risk  (11/11/2021)   Overall Financial Resource Strain (CARDIA)    Difficulty of Paying Living Expenses: Not hard at all  Food Insecurity: No Food Insecurity (11/11/2021)   Hunger Vital Sign    Worried About Running Out of Food in the Last Year: Never true    Ran Out of Food in the Last Year: Never true  Transportation Needs: No Transportation Needs (11/11/2021)   PRAPARE - Hydrologist (Medical): No    Lack of Transportation (Non-Medical): No  Physical Activity: Inactive (11/11/2021)   Exercise Vital Sign    Days of Exercise per Week: 0 days    Minutes of Exercise per Session: 0 min  Stress: No Stress Concern Present (11/11/2021)   [place]    Feeling of Stress : Not at all  Social Connections: Moderately Integrated (11/11/2021)   Social Connection and Isolation Panel [NHANES]    Frequency of Communication with Friends and Family: More than three times a week    Frequency of Social Gatherings with Friends and Family: More than three times a week    Attends Religious Services: Never    Marine scientist or Organizations: Yes    Attends Archivist Meetings: 1 to 4 times per year    Marital Status: Married  Human resources officer Violence: Not At Risk (11/11/2021)   Humiliation, Afraid, Rape, and Kick questionnaire    Fear of Current or Ex-Partner: No    Emotionally Abused: No    Physically Abused: No    Sexually Abused: No    FAMILY HISTORY: Family History  Problem Relation Age of Onset   Intellectual disability Mother    Hypertension Mother     Hyperlipidemia Mother    Depression Mother    Arthritis Mother    Mental illness Mother    Heart disease Father    Diabetes Father    Stroke Father    Heart disease Brother    COPD Brother    Mental illness Daughter    Learning disabilities Daughter    Diabetes Daughter    Allergic rhinitis Neg Hx    Angioedema Neg Hx    Asthma Neg Hx    Eczema Neg Hx    Immunodeficiency Neg Hx    Urticaria Neg Hx    Colon cancer Neg Hx    Pancreatic cancer Neg Hx    Liver cancer Neg Hx    Stomach cancer Neg Hx     ALLERGIES:  is allergic to shellfish allergy and shellfish-derived products.  MEDICATIONS:  Current Outpatient Medications  Medication Sig Dispense Refill   tirzepatide (MOUNJARO) 5 MG/0.5ML Pen Inject 5 mg into the skin once a  week. 6 mL 0   baclofen (LIORESAL) 20 MG tablet Take 20 mg by mouth 2 (two) times daily.     busPIRone (BUSPAR) 10 MG tablet Take 1 tablet (10 mg total) by mouth 2 (two) times daily. 60 tablet 0   carvedilol (COREG) 3.125 MG tablet Take 1 tablet (3.125 mg total) by mouth 2 (two) times daily with a meal. 180 tablet 1   EPINEPHrine 0.3 mg/0.3 mL IJ SOAJ injection Inject into the muscle.     ferrous sulfate 325 (65 FE) MG EC tablet Take 1 tablet (325 mg total) by mouth 2 (two) times daily. 60 tablet 2   furosemide (LASIX) 20 MG tablet Take 1 tablet (20 mg total) by mouth daily. 90 tablet 1   gabapentin (NEURONTIN) 600 MG tablet Take 600 mg by mouth 2 (two) times daily.     lactulose (CHRONULAC) 10 GM/15ML solution Take 30 mLs by mouth 3 (three) times daily.     magic mouthwash (nystatin, diphenhydrAMINE, alum & mag hydroxide) suspension mixture Swish and spit 5 mLs 3 (three) times daily as needed for mouth pain. 240 mL 0   midodrine (PROAMATINE) 5 MG tablet Take 1 tablet (5 mg total) by mouth 3 (three) times daily with meals. 270 tablet 1   MOUNJARO 2.5 MG/0.5ML Pen Inject into the skin.     pantoprazole (PROTONIX) 40 MG tablet Take 1 tablet (40 mg total) by  mouth daily. 30 tablet 3   spironolactone (ALDACTONE) 25 MG tablet Take 1 tablet (25 mg total) by mouth daily. 90 tablet 1   No current facility-administered medications for this visit.     PHYSICAL EXAMINATION: ECOG PERFORMANCE STATUS: 0 - Asymptomatic  Vitals:   12/15/21 1340  BP: (!) 94/49  Pulse: 99  Resp: 18  Temp: 97.9 F (36.6 C)  SpO2: 97%    Filed Weights   12/15/21 1340  Weight: 280 lb 14.4 oz (127.4 kg)    Physical Exam Constitutional:      Appearance: Normal appearance.  Musculoskeletal:        General: Swelling (1+ swelling bilateral lower extremities) present. Normal range of motion.     Cervical back: Normal range of motion and neck supple.  Neurological:     General: No focal deficit present.     Mental Status: She is alert.    LABORATORY DATA:  I have reviewed the data as listed Lab Results  Component Value Date   WBC 9.7 12/09/2021   HGB 14.6 12/09/2021   HCT 41.4 12/09/2021   MCV 90.2 12/09/2021   PLT 183 12/09/2021     Chemistry      Component Value Date/Time   NA 135 12/09/2021 0923   NA 141 12/18/2019 1506   K 4.1 12/09/2021 0923   CL 103 12/09/2021 0923   CO2 26 12/09/2021 0923   BUN 13 12/09/2021 0923   BUN 16 12/18/2019 1506   CREATININE 0.94 12/09/2021 0923   CREATININE 1.24 (H) 10/29/2021 1328   CREATININE 1.09 (H) 12/24/2015 1649   GLU 138 (H) 09/19/2021 0920      Component Value Date/Time   CALCIUM 9.1 12/09/2021 0923   ALKPHOS 122 12/09/2021 0923   AST 47 (H) 12/09/2021 0923   AST 64 (H) 10/29/2021 1328   ALT 28 12/09/2021 0923   ALT 34 10/29/2021 1328   BILITOT 1.4 (H) 12/09/2021 0923   BILITOT 1.2 10/29/2021 1328     I reviewed the labs today with the patient.  No indication for  blood transfusion today. Hemoglobin today at 8.6.  Slowly progressive nonimmune mediated hemolysis.  Hemoglobin at baseline was around 14 g/dL.  Mild thrombocytopenia with platelet count ranging from 100 220,000. PNH panel normal DAT  negative SPEP without any evidence of myeloma. Complete complement consumption noted Cold agglutinin titer pending. Immunoglobulin levels are unremarkable except for elevated IgA.  RADIOGRAPHIC STUDIES: I have personally reviewed the radiological images as listed and agreed with the findings in the report. DG Chest 2 View  Result Date: 12/09/2021 CLINICAL DATA:  Shortness of breath. EXAM: CHEST - 2 VIEW COMPARISON:  Radiographs 09/21/2021.  CT 09/21/2021. FINDINGS: The heart size and mediastinal contours are stable. There is mild central airway thickening throughout the lungs with patchy bibasilar pulmonary opacities, improved from the prior examination. Remaining densities may reflect postinflammatory scarring. There is no consolidation, pneumothorax or significant pleural effusion. Mild degenerative changes are present within the spine. IMPRESSION: Improved bibasilar pulmonary opacities compared with prior studies from 3 months ago, likely reflecting residual postinflammatory scarring and central airway thickening. No residual focal airspace disease, significant pleural effusion or definite acute findings. Electronically Signed   By: Richardean Sale M.D.   On: 12/09/2021 11:48    Labs  HIV NR Hep NR Ferritin 72 on 4/14 Retic count ranges between 4-5% TSH 2.471 ANA negative DAT neg PNH neg Complement C4 11, low Ig A 545, otherwise normal Cold agglutinin neg Ferritin dropped to 25 on 4/28 B12 levels 697 SPEP, not detected  CBC from today reviewed white blood cell count at 6.7, hemoglobin of 11.6, hematocrit of 35 and platelet count at 94 CMP with AST of 58 and alk phos of 133 Ammonia of 62.  I have reviewed labs from last week.  No evidence of hemolysis or anemia.  Thrombocytopenia has resolved.  No leukopenia  All questions were answered. The patient knows to call the clinic with any problems, questions or concerns. I spent 30 minutes in the care of this patient including H and  P, review of records, counseling and coordination of care.    Benay Pike, MD 12/15/2021 1:47 PM

## 2022-01-05 ENCOUNTER — Inpatient Hospital Stay: Payer: Medicare Other | Attending: Hematology and Oncology | Admitting: Hematology and Oncology

## 2022-01-22 ENCOUNTER — Other Ambulatory Visit: Payer: Self-pay | Admitting: *Deleted

## 2022-01-22 ENCOUNTER — Encounter: Payer: Self-pay | Admitting: Family Medicine

## 2022-01-22 MED ORDER — PANTOPRAZOLE SODIUM 40 MG PO TBEC: 40.0000 mg | DELAYED_RELEASE_TABLET | Freq: Every day | ORAL | 3 refills | Status: DC

## 2022-01-30 ENCOUNTER — Encounter: Payer: Self-pay | Admitting: Family Medicine

## 2022-02-06 ENCOUNTER — Ambulatory Visit (INDEPENDENT_AMBULATORY_CARE_PROVIDER_SITE_OTHER): Payer: Medicare Other | Admitting: Family Medicine

## 2022-02-06 ENCOUNTER — Encounter: Payer: Self-pay | Admitting: Family Medicine

## 2022-02-06 VITALS — BP 109/75 | HR 99 | Temp 97.6°F | Ht 66.0 in | Wt 276.2 lb

## 2022-02-06 DIAGNOSIS — K7469 Other cirrhosis of liver: Secondary | ICD-10-CM

## 2022-02-06 DIAGNOSIS — N950 Postmenopausal bleeding: Secondary | ICD-10-CM

## 2022-02-06 DIAGNOSIS — R600 Localized edema: Secondary | ICD-10-CM

## 2022-02-06 MED ORDER — TIRZEPATIDE 10 MG/0.5ML ~~LOC~~ SOAJ: 10.0000 mg | SUBCUTANEOUS | 0 refills | Status: DC

## 2022-02-06 NOTE — Progress Notes (Signed)
Subjective:     Patient ID: Theresa Moran, female    DOB: [birth year], 61 y.o.   MRN: [id number]  Chief Complaint  Patient presents with   Blood sugar    HPI [facility]-seeing GI.   Lactulose prn.  Mounjaro not working like it was. Mental fogginess much better.  Chronic pain-better so weaning off Nucynta and gabapentin. Vaginal bleeding last wk-lasted 8d.  At least 1 pad/d and was passing clots. .  Had in hospital as well but they weren't concerned Edema-on lasix daily.  Gets intermitt  Health Maintenance Due  Topic Date Due   Zoster Vaccines- Shingrix (1 of 2) Never done   PAP SMEAR-Modifier  03/22/2021   INFLUENZA VACCINE  12/23/2021   MAMMOGRAM  02/05/2022    Past Medical History:  Diagnosis Date   Allergy    Anxiety    Asthma    in the past   Chronic pain    Depression    Gallstones    GERD (gastroesophageal reflux disease)    Headache    stopped after menopause   Neuromuscular disorder ([facility])    Urticaria     Past Surgical History:  Procedure Laterality Date   APPENDECTOMY  1979   BIOPSY  09/25/2021   Procedure: BIOPSY;  Surgeon: Yetta Flock, MD;  Location: [place];  Service: Gastroenterology;;   BREAST REDUCTION SURGERY     BREAST SURGERY  1980   breast reduction   CHOLECYSTECTOMY     COLONOSCOPY WITH PROPOFOL N/A 09/25/2021   Procedure: COLONOSCOPY WITH PROPOFOL;  Surgeon: Yetta Flock, MD;  Location: [place];  Service: Gastroenterology;  Laterality: N/A;   ESOPHAGOGASTRODUODENOSCOPY N/A 09/25/2021   Procedure: ESOPHAGOGASTRODUODENOSCOPY (EGD);  Surgeon: Yetta Flock, MD;  Location: [facility] ENDOSCOPY;  Service: Gastroenterology;  Laterality: N/A;   NASAL SEPTUM SURGERY     ORIF ANKLE FRACTURE Left 03/26/2021   Procedure: OPEN REDUCTION INTERNAL FIXATION (ORIF) ANKLE FRACTURE;  Surgeon: Armond Hang, MD;  Location: [place];  Service: Orthopedics;  Laterality: Left;   POLYPECTOMY  09/25/2021   Procedure: POLYPECTOMY;  Surgeon:  Yetta Flock, MD;  Location: [facility] ENDOSCOPY;  Service: Gastroenterology;;    Outpatient Medications Prior to Visit  Medication Sig Dispense Refill   baclofen (LIORESAL) 20 MG tablet Take 20 mg by mouth 2 (two) times daily.     busPIRone (BUSPAR) 10 MG tablet Take 1 tablet (10 mg total) by mouth 2 (two) times daily. 60 tablet 0   carvedilol (COREG) 3.125 MG tablet Take 1 tablet (3.125 mg total) by mouth 2 (two) times daily with a meal. 180 tablet 1   EPINEPHrine 0.3 mg/0.3 mL IJ SOAJ injection Inject into the muscle.     furosemide (LASIX) 20 MG tablet Take 1 tablet (20 mg total) by mouth daily. 90 tablet 1   gabapentin (NEURONTIN) 600 MG tablet Take 600 mg by mouth 2 (two) times daily.     lactulose (CHRONULAC) 10 GM/15ML solution Take 30 mLs by mouth 3 (three) times daily.     midodrine (PROAMATINE) 5 MG tablet Take 1 tablet (5 mg total) by mouth 3 (three) times daily with meals. 270 tablet 1   pantoprazole (PROTONIX) 40 MG tablet Take 1 tablet (40 mg total) by mouth daily. 30 tablet 3   rifaximin (XIFAXAN) 550 MG TABS tablet Take 550 mg by mouth in the morning and at bedtime.     spironolactone (ALDACTONE) 25 MG tablet Take 1 tablet (25 mg total) by mouth daily. [place]  tablet 1   MOUNJARO 2.5 MG/0.5ML Pen Inject into the skin.     tirzepatide (MOUNJARO) 5 MG/0.5ML Pen Inject 5 mg into the skin once a week. 6 mL 0   ferrous sulfate 325 (65 FE) MG EC tablet Take 1 tablet (325 mg total) by mouth 2 (two) times daily. 60 tablet 2   magic mouthwash (nystatin, diphenhydrAMINE, alum & mag hydroxide) suspension mixture Swish and spit 5 mLs 3 (three) times daily as needed for mouth pain. (Patient not taking: Reported on 02/06/2022) 240 mL 0   No facility-administered medications prior to visit.    Allergies  Allergen Reactions   Shellfish Allergy Anaphylaxis, Hives, Itching, Shortness Of Breath and Swelling   Shellfish-Derived Products    ROS neg/noncontributory except as noted HPI/below       Objective:     BP 109/75   Pulse 99   Temp 97.6 F (36.4 C) (Temporal)   Ht '5\' 6"'$  (1.676 m)   Wt 276 lb 3.2 oz (125.3 kg)   SpO2 98%   BMI 44.58 kg/m  Wt Readings from Last 3 Encounters:  02/06/22 276 lb 3.2 oz (125.3 kg)  12/15/21 280 lb 14.4 oz (127.4 kg)  12/09/21 283 lb 14.4 oz (128.8 kg)    Physical Exam   Gen: WDWN NAD HEENT: NCAT, conjunctiva not injected, sclera nonicteric NECK:  supple, no thyromegaly, no nodes, no carotid bruits CARDIAC: RRR, S1S2+, +1-2/6 murmur. DP 2+B LUNGS: CTAB. No wheezes ABDOMEN:  BS+, soft, NTND, No HSM, no masses EXT:  1+ edema MSK: no assist device!!  NEURO: A&O x3.  CN II-XII intact.  PSYCH: normal mood. Good eye contact     Assessment & Plan:   Problem List Items Addressed This Visit       Digestive   Cirrhosis of liver ([place])     Other   Bilateral lower extremity edema   Other Visit Diagnoses     Postmenopausal bleeding    -  Primary   Relevant Orders   Ambulatory referral to Obstetrics / Gynecology      Cirrhosis of liver-[facility]-seeing Hepatologist-WF.  Improved on Meds!!!   Cont.  Will inc Mounjaro to '10mg'$  as still needs to lose wt and improve liver. Message in 1 mo w/dose(whether to inc or cont 10). Chronic edema-stable.  Cont lasix '20mg'$  daily Postmenopausal bleeding-refer gyn urgently.   F/u 3 mo.    Meds ordered this encounter  Medications   tirzepatide (MOUNJARO) 10 MG/0.5ML Pen    Sig: Inject 10 mg into the skin once a week.    Dispense:  2 mL    Refill:  0    Wellington Hampshire, MD

## 2022-02-06 NOTE — Patient Instructions (Signed)
It was very nice to see you today!  Referring gyn   PLEASE NOTE:  If you had any lab tests please let us know if you have not heard back within a few days. You may see your results on MyChart before we have a chance to review them but we will give you a call once they are reviewed by [place]. If we ordered any referrals today, please let us know if you have not heard from their office within the next week.   Please try these tips to maintain a healthy lifestyle:  Eat most of your calories during the day when you are active. Eliminate processed foods including packaged sweets (pies, cakes, cookies), reduce intake of potatoes, white bread, white pasta, and white rice. Look for whole grain options, oat flour or almond flour.  Each meal should contain half fruits/vegetables, one quarter protein, and one quarter carbs (no bigger than a computer mouse).  Cut down on sweet beverages. This includes juice, soda, and sweet tea. Also watch fruit intake, though this is a healthier sweet option, it still contains natural sugar! Limit to 3 servings daily.  Drink at least 1 glass of water with each meal and aim for at least 8 glasses per day  Exercise at least 150 minutes every week.

## 2022-02-16 ENCOUNTER — Encounter: Payer: Self-pay | Admitting: *Deleted

## 2022-02-19 ENCOUNTER — Encounter: Payer: Self-pay | Admitting: Family Medicine

## 2022-03-03 ENCOUNTER — Other Ambulatory Visit: Payer: Self-pay | Admitting: Family Medicine

## 2022-03-11 ENCOUNTER — Encounter: Payer: Self-pay | Admitting: Family Medicine

## 2022-03-11 ENCOUNTER — Other Ambulatory Visit: Payer: Self-pay | Admitting: Family Medicine

## 2022-03-11 MED ORDER — BUSPIRONE HCL 10 MG PO TABS: 10.0000 mg | ORAL_TABLET | Freq: Two times a day (BID) | ORAL | 1 refills | Status: DC

## 2022-03-17 ENCOUNTER — Other Ambulatory Visit: Payer: Self-pay | Admitting: Family Medicine

## 2022-03-17 DIAGNOSIS — Z1231 Encounter for screening mammogram for malignant neoplasm of breast: Secondary | ICD-10-CM

## 2022-03-30 ENCOUNTER — Other Ambulatory Visit: Payer: Self-pay | Admitting: Nurse Practitioner

## 2022-03-30 DIAGNOSIS — K7460 Unspecified cirrhosis of liver: Secondary | ICD-10-CM

## 2022-04-02 ENCOUNTER — Ambulatory Visit
Admission: RE | Admit: 2022-04-02 | Discharge: 2022-04-02 | Disposition: A | Discharge status: Home / Self Care | Payer: Medicare Other | Source: Ambulatory Visit | Attending: Nurse Practitioner | Admitting: Nurse Practitioner

## 2022-04-02 DIAGNOSIS — K7460 Unspecified cirrhosis of liver: Secondary | ICD-10-CM

## 2022-04-02 LAB — HM PAP SMEAR: HPV, high-risk: NEGATIVE

## 2022-04-12 ENCOUNTER — Other Ambulatory Visit: Payer: Self-pay | Admitting: Family Medicine

## 2022-04-15 ENCOUNTER — Encounter: Payer: Self-pay | Admitting: Family Medicine

## 2022-04-27 ENCOUNTER — Encounter: Payer: Self-pay | Admitting: Family Medicine

## 2022-05-07 ENCOUNTER — Encounter: Payer: Self-pay | Admitting: *Deleted

## 2022-05-08 ENCOUNTER — Ambulatory Visit (INDEPENDENT_AMBULATORY_CARE_PROVIDER_SITE_OTHER): Payer: Medicare Other | Admitting: Family Medicine

## 2022-05-08 ENCOUNTER — Encounter: Payer: Self-pay | Admitting: Family Medicine

## 2022-05-08 VITALS — BP 110/74 | HR 96 | Temp 97.6°F | Ht 66.0 in | Wt 274.4 lb

## 2022-05-08 DIAGNOSIS — Z6841 Body Mass Index (BMI) 40.0 and over, adult: Secondary | ICD-10-CM

## 2022-05-08 DIAGNOSIS — Z23 Encounter for immunization: Secondary | ICD-10-CM

## 2022-05-08 DIAGNOSIS — D500 Iron deficiency anemia secondary to blood loss (chronic): Secondary | ICD-10-CM | POA: Diagnosis not present

## 2022-05-08 DIAGNOSIS — N950 Postmenopausal bleeding: Secondary | ICD-10-CM

## 2022-05-08 DIAGNOSIS — K760 Fatty (change of) liver, not elsewhere classified: Secondary | ICD-10-CM

## 2022-05-08 DIAGNOSIS — R600 Localized edema: Secondary | ICD-10-CM | POA: Diagnosis not present

## 2022-05-08 DIAGNOSIS — K7469 Other cirrhosis of liver: Secondary | ICD-10-CM

## 2022-05-08 LAB — HEMOGLOBIN A1C: Hgb A1c MFr Bld: 5.3 % (ref 4.6–6.5)

## 2022-05-08 LAB — CBC WITH DIFFERENTIAL/PLATELET
Basophils Absolute: 0.1 10*3/uL (ref 0.0–0.1)
Basophils Relative: 1.2 % (ref 0.0–3.0)
Eosinophils Absolute: 0.1 10*3/uL (ref 0.0–0.7)
Eosinophils Relative: 2.7 % (ref 0.0–5.0)
HCT: 42.7 % (ref 36.0–46.0)
Hemoglobin: 14.6 g/dL (ref 12.0–15.0)
Lymphocytes Relative: 26.5 % (ref 12.0–46.0)
Lymphs Abs: 1.5 10*3/uL (ref 0.7–4.0)
MCHC: 34.1 g/dL (ref 30.0–36.0)
MCV: 96 fl (ref 78.0–100.0)
Monocytes Absolute: 0.4 10*3/uL (ref 0.1–1.0)
Monocytes Relative: 7.8 % (ref 3.0–12.0)
Neutro Abs: 3.4 10*3/uL (ref 1.4–7.7)
Neutrophils Relative %: 61.8 % (ref 43.0–77.0)
Platelets: 93 10*3/uL — ABNORMAL LOW (ref 150.0–400.0)
RBC: 4.45 Mil/uL (ref 3.87–5.11)
RDW: 14.9 % (ref 11.5–15.5)
WBC: 5.5 10*3/uL (ref 4.0–10.5)

## 2022-05-08 LAB — BASIC METABOLIC PANEL
BUN: 14 mg/dL (ref 6–23)
CO2: 28 mEq/L (ref 19–32)
Calcium: 8.8 mg/dL (ref 8.4–10.5)
Chloride: 105 mEq/L (ref 96–112)
Creatinine, Ser: 0.8 mg/dL (ref 0.40–1.20)
GFR: 78.26 mL/min (ref >60.00)
Glucose, Bld: 110 mg/dL — ABNORMAL HIGH (ref 70–99)
Potassium: 4.1 mEq/L (ref 3.5–5.1)
Sodium: 141 mEq/L (ref 135–145)

## 2022-05-08 LAB — IBC + FERRITIN
Ferritin: 113.7 ng/mL (ref 10.0–291.0)
Iron: 124 ug/dL (ref 42–145)
Saturation Ratios: 42.2 % (ref 20.0–50.0)
TIBC: 294 ug/dL (ref 250.0–450.0)
Transferrin: 210 mg/dL — ABNORMAL LOW (ref 212.0–360.0)

## 2022-05-08 LAB — LIPID PANEL
Cholesterol: 200 mg/dL (ref 0–200)
HDL: 60.1 mg/dL (ref >39.00)
LDL Cholesterol: 119 mg/dL — ABNORMAL HIGH (ref 0–99)
NonHDL: 139.67
Total CHOL/HDL Ratio: 3
Triglycerides: 101 mg/dL (ref 0.0–149.0)
VLDL: 20.2 mg/dL (ref 0.0–40.0)

## 2022-05-08 LAB — VITAMIN B12: Vitamin B-12: 634 pg/mL (ref 211–911)

## 2022-05-08 NOTE — Patient Instructions (Signed)
It was very nice to see you today!  Happy Holidays!  Try lasix every other day   PLEASE NOTE:  If you had any lab tests please let us know if you have not heard back within a few days. You may see your results on MyChart before we have a chance to review them but we will give you a call once they are reviewed by [place]. If we ordered any referrals today, please let us know if you have not heard from their office within the next week.   Please try these tips to maintain a healthy lifestyle:  Eat most of your calories during the day when you are active. Eliminate processed foods including packaged sweets (pies, cakes, cookies), reduce intake of potatoes, white bread, white pasta, and white rice. Look for whole grain options, oat flour or almond flour.  Each meal should contain half fruits/vegetables, one quarter protein, and one quarter carbs (no bigger than a computer mouse).  Cut down on sweet beverages. This includes juice, soda, and sweet tea. Also watch fruit intake, though this is a healthier sweet option, it still contains natural sugar! Limit to 3 servings daily.  Drink at least 1 glass of water with each meal and aim for at least 8 glasses per day  Exercise at least 150 minutes every week.

## 2022-05-08 NOTE — Progress Notes (Signed)
Subjective:     Patient ID: Theresa Moran, female    DOB: 1960/05/30, 61 y.o.   MRN: [id number]  Chief Complaint  Patient presents with   Follow-up    3 month follow-up on liver Fasting    Dizziness    Dizziness when bending down started a couple of weeks ago    HPI  MASH-seeing GI.  Stable.  Working on wt loss.xifaxan "miracle drug"  brain fog gone. Chronic pain-stable.  Off meds!Marland Kitchen  More active.  Nerve pain gone.  Just arthritis.  Vag bleeding-having bx soon and hsp Obesity-on Mounjaro '10mg'$  weekly.  Not taken for 3 wks-will get back on.  Will start swimming again.  Dizzy when bends over-bp's can be low, but needs meds Edema-wearing compression stockings.  Taking spironolactone and lasix daily.   Health Maintenance Due  Topic Date Due   PAP SMEAR-Modifier  03/22/2021   MAMMOGRAM  02/05/2022    Past Medical History:  Diagnosis Date   Allergy    Anxiety    Asthma    in the past   Chronic pain    Depression    Gallstones    GERD (gastroesophageal reflux disease)    Headache    stopped after menopause   Neuromuscular disorder (HCC)    Urticaria     Past Surgical History:  Procedure Laterality Date   APPENDECTOMY  1979   BIOPSY  09/25/2021   Procedure: BIOPSY;  Surgeon: Yetta Flock, MD;  Location: [place];  Service: Gastroenterology;;   BREAST REDUCTION SURGERY     BREAST SURGERY  1980   breast reduction   CHOLECYSTECTOMY     COLONOSCOPY WITH PROPOFOL N/A 09/25/2021   Procedure: COLONOSCOPY WITH PROPOFOL;  Surgeon: Yetta Flock, MD;  Location: [place];  Service: Gastroenterology;  Laterality: N/A;   ESOPHAGOGASTRODUODENOSCOPY N/A 09/25/2021   Procedure: ESOPHAGOGASTRODUODENOSCOPY (EGD);  Surgeon: Yetta Flock, MD;  Location: [facility] ENDOSCOPY;  Service: Gastroenterology;  Laterality: N/A;   NASAL SEPTUM SURGERY     ORIF ANKLE FRACTURE Left 03/26/2021   Procedure: OPEN REDUCTION INTERNAL FIXATION (ORIF) ANKLE FRACTURE;  Surgeon:  Armond Hang, MD;  Location: [place];  Service: Orthopedics;  Laterality: Left;   POLYPECTOMY  09/25/2021   Procedure: POLYPECTOMY;  Surgeon: Yetta Flock, MD;  Location: [facility] ENDOSCOPY;  Service: Gastroenterology;;    Outpatient Medications Prior to Visit  Medication Sig Dispense Refill   baclofen (LIORESAL) 20 MG tablet Take 20 mg by mouth 2 (two) times daily.     busPIRone (BUSPAR) 10 MG tablet Take 1 tablet (10 mg total) by mouth 2 (two) times daily. 180 tablet 1   carvedilol (COREG) 3.125 MG tablet TAKE 1 TABLET(3.125 MG) BY MOUTH TWICE DAILY WITH A MEAL 180 tablet 1   EPINEPHrine 0.3 mg/0.3 mL IJ SOAJ injection Inject into the muscle.     furosemide (LASIX) 20 MG tablet TAKE 1 TABLET(20 MG) BY MOUTH DAILY 90 tablet 1   lactulose (CHRONULAC) 10 GM/15ML solution Take 30 mLs by mouth 3 (three) times daily.     midodrine (PROAMATINE) 5 MG tablet Take 1 tablet (5 mg total) by mouth 3 (three) times daily with meals. 270 tablet 1   MOUNJARO 10 MG/0.5ML Pen INJECT 10 MG UNDER THE SKIN ONE DAY A WEEK 2 mL 0   nortriptyline (PAMELOR) 50 MG capsule Take 100 mg by mouth at bedtime.     pantoprazole (PROTONIX) 40 MG tablet Take 1 tablet (40 mg total) by mouth daily. [place]  tablet 3   rifaximin (XIFAXAN) 550 MG TABS tablet Take 550 mg by mouth in the morning and at bedtime.     spironolactone (ALDACTONE) 25 MG tablet TAKE 1 TABLET(25 MG) BY MOUTH DAILY 90 tablet 1   ferrous sulfate 325 (65 FE) MG EC tablet Take 1 tablet (325 mg total) by mouth 2 (two) times daily. 60 tablet 2   gabapentin (NEURONTIN) 600 MG tablet Take 600 mg by mouth 2 (two) times daily.     No facility-administered medications prior to visit.    Allergies  Allergen Reactions   Shellfish Allergy Anaphylaxis, Hives, Itching, Shortness Of Breath and Swelling   Shellfish-Derived Products    ROS neg/noncontributory except as noted HPI/below Has fallen 3 x-tripped on stairs      Objective:     BP 110/74   Pulse 96    Temp 97.6 F (36.4 C) (Temporal)   Ht '5\' 6"'$  (1.676 m)   Wt 274 lb 6 oz (124.5 kg)   SpO2 99%   BMI 44.29 kg/m  Wt Readings from Last 3 Encounters:  05/08/22 274 lb 6 oz (124.5 kg)  02/06/22 276 lb 3.2 oz (125.3 kg)  12/15/21 280 lb 14.4 oz (127.4 kg)    Physical Exam   Gen: WDWN NAD HEENT: NCAT, conjunctiva not injected, sclera nonicteric NECK:  supple, no thyromegaly, no nodes, no carotid bruits CARDIAC: tachy RRR, S1S2+, no murmur. DP 2+B LUNGS: CTAB. No wheezes ABDOMEN:  BS+, soft, NTND, No HSM, no masses EXT: tr edema MSK: no gross abnormalities.  NEURO: A&O x3.  CN II-XII intact.  PSYCH: normal mood. Good eye contact     Assessment & Plan:   Problem List Items Addressed This Visit       Digestive   Cirrhosis of liver ([place])   Relevant Orders   Hemoglobin A1c   Lipid panel     Other   Morbid obesity with BMI of 50.0-59.9, adult (HCC)   Bilateral lower extremity edema   Relevant Orders   Basic metabolic panel   IDA (iron deficiency anemia) - Primary   Relevant Orders   CBC with Differential/Platelet   IBC + Ferritin   Vitamin B12   Other Visit Diagnoses     Need for immunization against influenza       Relevant Orders   Flu Vaccine QUAD High Dose(Fluad) (Completed)   Postmenopausal bleeding       Disorder of iron metabolism, unspecified       Relevant Orders   Hemoglobin A1c   Fatty (change of) liver, not elsewhere classified       Relevant Orders   Lipid panel      Mash-chronic.  Stable.  Cont meds/care per hepatology.  Check A1C, bmp(LFT's checked last mo),lipids H/o anemia-all thought to be from decompensated liver.  Check cbc,B12,iron studies Postmen bleeding-saw gyn, will get hsp/bx soon. MO-has lost significant wt w/mounjaro '10mg'$ .  Stopped 3 wks ago(no good reason), will restart.  Edema-chronic.  Controlled mostly.  Wearing compression stockings.  Taking lasix '20mg'$  daily but gets dizzy d/t low bp.  Try qod and see if better.  If not, then  daily F/u 16mo sooner if needed  No orders of the defined types were placed in this encounter.   AWellington Hampshire MD

## 2022-05-08 NOTE — Progress Notes (Signed)
Iron looks good-decrease to 1/day for now. Platelets are low-if bruising a lot, let us know, otherwise, repeat cbc 2 wks for stability.  Rest of labs look good

## 2022-05-11 ENCOUNTER — Other Ambulatory Visit: Payer: Self-pay | Admitting: Family Medicine

## 2022-05-11 ENCOUNTER — Other Ambulatory Visit: Payer: Self-pay | Admitting: *Deleted

## 2022-05-11 ENCOUNTER — Encounter: Payer: Self-pay | Admitting: *Deleted

## 2022-05-11 ENCOUNTER — Ambulatory Visit: Payer: Medicare Other

## 2022-05-11 DIAGNOSIS — D696 Thrombocytopenia, unspecified: Secondary | ICD-10-CM

## 2022-05-11 NOTE — Progress Notes (Signed)
c 

## 2022-05-22 ENCOUNTER — Other Ambulatory Visit: Payer: Medicare Other

## 2022-05-26 ENCOUNTER — Other Ambulatory Visit (INDEPENDENT_AMBULATORY_CARE_PROVIDER_SITE_OTHER): Payer: Medicare Other

## 2022-05-26 DIAGNOSIS — D696 Thrombocytopenia, unspecified: Secondary | ICD-10-CM

## 2022-05-26 LAB — CBC
HCT: 43.2 % (ref 36.0–46.0)
Hemoglobin: 14.9 g/dL (ref 12.0–15.0)
MCHC: 34.5 g/dL (ref 30.0–36.0)
MCV: 95.3 fl (ref 78.0–100.0)
Platelets: 99 10*3/uL — ABNORMAL LOW (ref 150.0–400.0)
RBC: 4.54 Mil/uL (ref 3.87–5.11)
RDW: 14.5 % (ref 11.5–15.5)
WBC: 7 10*3/uL (ref 4.0–10.5)

## 2022-05-26 LAB — HM MAMMOGRAPHY

## 2022-05-26 NOTE — Progress Notes (Signed)
Please have her touch base w/hematology to see if we should just monitor for now or needs something else.

## 2022-05-27 ENCOUNTER — Encounter: Payer: Self-pay | Admitting: Family Medicine

## 2022-05-27 ENCOUNTER — Encounter: Payer: Self-pay | Admitting: Hematology and Oncology

## 2022-05-29 ENCOUNTER — Other Ambulatory Visit: Payer: Self-pay | Admitting: Family Medicine

## 2022-06-05 ENCOUNTER — Encounter: Payer: Self-pay | Admitting: Hematology and Oncology

## 2022-06-10 ENCOUNTER — Telehealth: Payer: Self-pay | Admitting: Hematology and Oncology

## 2022-06-10 NOTE — Telephone Encounter (Signed)
Contacted patient to scheduled appointments. Patient is aware of appointments that are scheduled.   

## 2022-06-15 ENCOUNTER — Inpatient Hospital Stay (HOSPITAL_BASED_OUTPATIENT_CLINIC_OR_DEPARTMENT_OTHER): Payer: Medicare (Managed Care) | Admitting: Hematology and Oncology

## 2022-06-15 ENCOUNTER — Other Ambulatory Visit: Payer: Self-pay

## 2022-06-15 ENCOUNTER — Inpatient Hospital Stay: Payer: Medicare (Managed Care) | Attending: Hematology and Oncology

## 2022-06-15 VITALS — BP 122/65 | HR 97 | Temp 97.9°F | Resp 16 | Ht 66.0 in | Wt 267.8 lb

## 2022-06-15 DIAGNOSIS — Z87891 Personal history of nicotine dependence: Secondary | ICD-10-CM | POA: Insufficient documentation

## 2022-06-15 DIAGNOSIS — D696 Thrombocytopenia, unspecified: Secondary | ICD-10-CM

## 2022-06-15 DIAGNOSIS — Z79899 Other long term (current) drug therapy: Secondary | ICD-10-CM | POA: Insufficient documentation

## 2022-06-15 DIAGNOSIS — R000 Tachycardia, unspecified: Secondary | ICD-10-CM

## 2022-06-15 DIAGNOSIS — K7469 Other cirrhosis of liver: Secondary | ICD-10-CM

## 2022-06-15 DIAGNOSIS — D509 Iron deficiency anemia, unspecified: Secondary | ICD-10-CM

## 2022-06-15 DIAGNOSIS — D599 Acquired hemolytic anemia, unspecified: Secondary | ICD-10-CM

## 2022-06-15 LAB — COMPREHENSIVE METABOLIC PANEL
ALT: 35 U/L (ref 0–44)
AST: 60 U/L — ABNORMAL HIGH (ref 15–41)
Albumin: 3.8 g/dL (ref 3.5–5.0)
Alkaline Phosphatase: 138 U/L — ABNORMAL HIGH (ref 38–126)
Anion gap: 6 (ref 5–15)
BUN: 15 mg/dL (ref 8–23)
CO2: 28 mmol/L (ref 22–32)
Calcium: 9.7 mg/dL (ref 8.9–10.3)
Chloride: 105 mmol/L (ref 98–111)
Creatinine, Ser: 1.16 mg/dL — ABNORMAL HIGH (ref 0.44–1.00)
GFR, Estimated: 53 mL/min — ABNORMAL LOW (ref >60)
Glucose, Bld: 89 mg/dL (ref 70–99)
Potassium: 4.4 mmol/L (ref 3.5–5.1)
Sodium: 139 mmol/L (ref 135–145)
Total Bilirubin: 1.2 mg/dL (ref 0.3–1.2)
Total Protein: 7.5 g/dL (ref 6.5–8.1)

## 2022-06-15 LAB — CBC WITH DIFFERENTIAL/PLATELET
Abs Immature Granulocytes: 0.02 10*3/uL (ref 0.00–0.07)
Basophils Absolute: 0.1 10*3/uL (ref 0.0–0.1)
Basophils Relative: 1 %
Eosinophils Absolute: 0.4 10*3/uL (ref 0.0–0.5)
Eosinophils Relative: 5 %
HCT: 43.9 % (ref 36.0–46.0)
Hemoglobin: 15.2 g/dL — ABNORMAL HIGH (ref 12.0–15.0)
Immature Granulocytes: 0 %
Lymphocytes Relative: 21 %
Lymphs Abs: 1.4 10*3/uL (ref 0.7–4.0)
MCH: 32.3 pg (ref 26.0–34.0)
MCHC: 34.6 g/dL (ref 30.0–36.0)
MCV: 93.4 fL (ref 80.0–100.0)
Monocytes Absolute: 0.5 10*3/uL (ref 0.1–1.0)
Monocytes Relative: 8 %
Neutro Abs: 4.4 10*3/uL (ref 1.7–7.7)
Neutrophils Relative %: 65 %
Platelets: 102 10*3/uL — ABNORMAL LOW (ref 150–400)
RBC: 4.7 MIL/uL (ref 3.87–5.11)
RDW: 14 % (ref 11.5–15.5)
WBC: 6.8 10*3/uL (ref 4.0–10.5)
nRBC: 0 % (ref 0.0–0.2)

## 2022-06-15 LAB — TSH: TSH: 1.972 u[IU]/mL (ref 0.350–4.500)

## 2022-06-15 LAB — SAMPLE TO BLOOD BANK

## 2022-06-15 NOTE — Progress Notes (Signed)
Theresa Moran  Patient Care Team: Tawnya Crook, MD as PCP - General (Family Medicine)  CHIEF COMPLAINTS/PURPOSE OF CONSULTATION:  Thrombocytopenia.  ASSESSMENT & PLAN:   This is a pleasant 62 year old female patient with past medical history significant for neuropathy anxiety/depression referred to hematology for evaluation of progressive thrombocytopenia.    On evaluation of labs, she was found to have progressive anemia, nonimmune mediated hemolysis with reticulocyte count around 5%. Given no clear etiology for the nonimmune mediated hemolytic anemia, recommended imaging which showed hepatic cirrhosis and splenomegaly, diffuse thickening of the stomach concerning for infiltrative diseases such as lymphoma or inflammation.   BMB showed hypercellular marrow with erythroid hyperplasia, morphologic features do not provide a precise explanation for the patient cytopenias. Endoscopy and colonoscopy with evidence of obvious pathology.  Hemolytic anemia resolved.  She however continues to have mild thrombocytopenia and is here for follow-up. She is doing really well overall. She denies any complaints same time. No concerns on exam. I suspect she has mild thrombocytopenia from underlying cirrhosis. No intervention needed at this time. She understands the etiology of thrombocytopenia in cirrhosis. I applauded her on the weight loss. She can RTC in one yr or sooner as needed.  HISTORY OF PRESENTING ILLNESS:  Theresa Moran 62 y.o. female is here because of thrombocytopenia  This is a very pleasant 62 year old female patient with history of thrombocytopenia referred to hematology for evaluation and recommendations.    Since she last saw me she had her PCP who wanted her to be followed up here for ongoing thrombocytopenia. She has lost about 50 lbs of weight since last visit and she is doing really well.  She follows up with Dr Roosevelt Locks for hepatology. She is now dealing  with vaginal bleeding followed by Dr Hurshel Party and has a hysteroscopy and D and C. Rest of the pertinent 10 point ROS reviewed and negative  MEDICAL HISTORY:  Past Medical History:  Diagnosis Date   Allergy    Anxiety    Asthma    in the past   Chronic pain    Depression    Gallstones    GERD (gastroesophageal reflux disease)    Headache    stopped after menopause   Neuromuscular disorder ([place])    Urticaria     SURGICAL HISTORY: Past Surgical History:  Procedure Laterality Date   APPENDECTOMY  1979   BIOPSY  09/25/2021   Procedure: BIOPSY;  Surgeon: Yetta Flock, MD;  Location: [place];  Service: Gastroenterology;;   BREAST REDUCTION SURGERY     BREAST SURGERY  1980   breast reduction   CHOLECYSTECTOMY     COLONOSCOPY WITH PROPOFOL N/A 09/25/2021   Procedure: COLONOSCOPY WITH PROPOFOL;  Surgeon: Yetta Flock, MD;  Location: [place];  Service: Gastroenterology;  Laterality: N/A;   ESOPHAGOGASTRODUODENOSCOPY N/A 09/25/2021   Procedure: ESOPHAGOGASTRODUODENOSCOPY (EGD);  Surgeon: Yetta Flock, MD;  Location[facility] ENDOSCOPY;  Service: Gastroenterology;  Laterality: N/A;   NASAL SEPTUM SURGERY     ORIF ANKLE FRACTURE Left 03/26/2021   Procedure: OPEN REDUCTION INTERNAL FIXATION (ORIF) ANKLE FRACTURE;  Surgeon: Armond Hang, MD;  Location: [place];  Service: Orthopedics;  Laterality: Left;   POLYPECTOMY  09/25/2021   Procedure: POLYPECTOMY;  Surgeon: Yetta Flock, MD;  Location: [facility] ENDOSCOPY;  Service: Gastroenterology;;    SOCIAL HISTORY: Social History   Socioeconomic History   Marital status: Married    Spouse name: Not on file   Number  of children: Not on file   Years of education: Not on file   Highest education level: Not on file  Occupational History   Not on file  Tobacco Use   Smoking status: Former    Packs/day: 1.00    Years: 15.00    Total pack years: 15.00    Types: Cigarettes    Quit date: 01/2004    Years since  quitting: 18.4   Smokeless tobacco: Never  Vaping Use   Vaping Use: Never used  Substance and Sexual Activity   Alcohol use: Not Currently   Drug use: No   Sexual activity: Not Currently  Other Topics Concern   Not on file  Social History Narrative   Has 1 child(artificial insem), 2 adopted, and 1 step from partner's former partner   Raising 4 grandchildren.   Social Determinants of Health   Financial Resource Strain: Low Risk  (11/11/2021)   Overall Financial Resource Strain (CARDIA)    Difficulty of Paying Living Expenses: Not hard at all  Food Insecurity: No Food Insecurity (11/11/2021)   Hunger Vital Sign    Worried About Running Out of Food in the Last Year: Never true    Ran Out of Food in the Last Year: Never true  Transportation Needs: No Transportation Needs (11/11/2021)   PRAPARE - Hydrologist (Medical): No    Lack of Transportation (Non-Medical): No  Physical Activity: Inactive (11/11/2021)   Exercise Vital Sign    Days of Exercise per Week: 0 days    Minutes of Exercise per Session: 0 min  Stress: No Stress Concern Present (11/11/2021)   [place]    Feeling of Stress : Not at all  Social Connections: Moderately Integrated (11/11/2021)   Social Connection and Isolation Panel [NHANES]    Frequency of Communication with Friends and Family: More than three times a week    Frequency of Social Gatherings with Friends and Family: More than three times a week    Attends Religious Services: Never    Marine scientist or Organizations: Yes    Attends Archivist Meetings: 1 to 4 times per year    Marital Status: Married  Human resources officer Violence: Not At Risk (11/11/2021)   Humiliation, Afraid, Rape, and Kick questionnaire    Fear of Current or Ex-Partner: No    Emotionally Abused: No    Physically Abused: No    Sexually Abused: No    FAMILY HISTORY: Family  History  Problem Relation Age of Onset   Intellectual disability Mother    Hypertension Mother    Hyperlipidemia Mother    Depression Mother    Arthritis Mother    Mental illness Mother    Heart disease Father    Diabetes Father    Stroke Father    Heart disease Brother    COPD Brother    Mental illness Daughter    Learning disabilities Daughter    Diabetes Daughter    Allergic rhinitis Neg Hx    Angioedema Neg Hx    Asthma Neg Hx    Eczema Neg Hx    Immunodeficiency Neg Hx    Urticaria Neg Hx    Colon cancer Neg Hx    Pancreatic cancer Neg Hx    Liver cancer Neg Hx    Stomach cancer Neg Hx     ALLERGIES:  is allergic to shellfish allergy and shellfish-derived products.  MEDICATIONS:  Current Outpatient Medications  Medication Sig Dispense Refill   baclofen (LIORESAL) 20 MG tablet Take 20 mg by mouth 2 (two) times daily.     busPIRone (BUSPAR) 10 MG tablet Take 1 tablet (10 mg total) by mouth 2 (two) times daily. 180 tablet 1   carvedilol (COREG) 3.125 MG tablet TAKE 1 TABLET(3.125 MG) BY MOUTH TWICE DAILY WITH A MEAL 180 tablet 1   EPINEPHrine 0.3 mg/0.3 mL IJ SOAJ injection Inject into the muscle.     ferrous sulfate 325 (65 FE) MG EC tablet Take 1 tablet (325 mg total) by mouth 2 (two) times daily. 60 tablet 2   furosemide (LASIX) 20 MG tablet TAKE 1 TABLET(20 MG) BY MOUTH DAILY 90 tablet 1   lactulose (CHRONULAC) 10 GM/15ML solution Take 30 mLs by mouth 3 (three) times daily.     midodrine (PROAMATINE) 5 MG tablet Take 1 tablet (5 mg total) by mouth 3 (three) times daily with meals. 270 tablet 1   nortriptyline (PAMELOR) 50 MG capsule Take 100 mg by mouth at bedtime.     pantoprazole (PROTONIX) 40 MG tablet TAKE 1 TABLET(40 MG) BY MOUTH DAILY 30 tablet 3   rifaximin (XIFAXAN) 550 MG TABS tablet Take 550 mg by mouth in the morning and at bedtime.     spironolactone (ALDACTONE) 25 MG tablet TAKE 1 TABLET(25 MG) BY MOUTH DAILY 90 tablet 1   tirzepatide (MOUNJARO) 10  MG/0.5ML Pen INJECT 10 MG(0.5 ML) UNDER THE SKIN ONE DAY A WEEK 6 mL 1   No current facility-administered medications for this visit.     PHYSICAL EXAMINATION: ECOG PERFORMANCE STATUS: 0 - Asymptomatic  Vitals:   06/15/22 0850  BP: 122/65  Pulse: 97  Resp: 16  Temp: 97.9 F (36.6 C)  SpO2: 98%    Filed Weights   06/15/22 0850  Weight: 267 lb 12.8 oz (121.5 kg)    Physical Exam Constitutional:      Appearance: Normal appearance.  Musculoskeletal:        General: Swelling (1+ swelling bilateral lower extremities) present. Normal range of motion.     Cervical back: Normal range of motion and neck supple.  Neurological:     General: No focal deficit present.     Mental Status: She is alert.    LABORATORY DATA:  I have reviewed the data as listed Lab Results  Component Value Date   WBC 6.8 06/15/2022   HGB 15.2 (H) 06/15/2022   HCT 43.9 06/15/2022   MCV 93.4 06/15/2022   PLT 102 (L) 06/15/2022     Chemistry      Component Value Date/Time   NA 141 05/08/2022 0855   NA 141 12/18/2019 1506   K 4.1 05/08/2022 0855   CL 105 05/08/2022 0855   CO2 28 05/08/2022 0855   BUN 14 05/08/2022 0855   BUN 16 12/18/2019 1506   CREATININE 0.80 05/08/2022 0855   CREATININE 1.24 (H) 10/29/2021 1328   CREATININE 1.09 (H) 12/24/2015 1649   GLU 138 (H) 09/19/2021 0920      Component Value Date/Time   CALCIUM 8.8 05/08/2022 0855   ALKPHOS 122 12/09/2021 0923   AST 47 (H) 12/09/2021 0923   AST 64 (H) 10/29/2021 1328   ALT 28 12/09/2021 0923   ALT 34 10/29/2021 1328   BILITOT 1.4 (H) 12/09/2021 0923   BILITOT 1.2 10/29/2021 1328     Labs  HIV NR Hep NR Ferritin 72 on 4/14 Retic count ranges between 4-5% TSH 2.471 ANA  negative DAT neg PNH neg Complement C4 11, low Ig A 545, otherwise normal Cold agglutinin neg Ferritin dropped to 25 on 4/28 B12 levels 697 SPEP, not detected  RADIOGRAPHIC STUDIES: I have personally reviewed the radiological images as listed and  agreed with the findings in the report. No results found.  CBC from today reviewed no evidence of anemia, mild thrombocytopenia.  All questions were answered. The patient knows to call the clinic with any problems, questions or concerns. I spent 30 minutes in the care of this patient including H and P, review of records, counseling and coordination of care.    Benay Pike, MD 06/15/2022 8:57 AM

## 2022-06-16 ENCOUNTER — Encounter (HOSPITAL_BASED_OUTPATIENT_CLINIC_OR_DEPARTMENT_OTHER): Payer: Self-pay | Admitting: Obstetrics and Gynecology

## 2022-06-16 ENCOUNTER — Other Ambulatory Visit: Payer: Self-pay

## 2022-06-16 NOTE — Progress Notes (Signed)
Spoke w/ via phone for pre-op interview---[facility] Lab needs dos---- cbc, type & screen            Lab results------06/15/22 cbc, cmp in Epic, 09/21/21 EKG in chart & Epic, 09/21/21 Chest PE in Epic, 12/09/21 chest xray in Epic, 09/22/2021 Echo in Epic, EG 60 - 65% COVID test -----patient states asymptomatic no test needed Arrive at -------0530 on Friday, 06/19/22 NPO after MN NO Solid Food.  Clear liquids from MN until---0430 Med rec completed Medications to take morning of surgery -----Baclofen, Buspar, Coreg, Midodrine, Protonix, Rifaxamin Diabetic medication -----Patient stated that she stopped taking Mounjaro 2 weeks ago for surgery as of 06/16/22. Patient instructed no nail polish to be worn day of surgery Patient instructed to bring photo id and insurance card day of surgery Patient aware to have Driver (ride ) / caregiver    for 24 hours after surgery - wife, Lindy Patient Special Instructions -----none Pre-Op special Istructions -----none Patient verbalized understanding of instructions that were given at this phone interview. Patient denies shortness of breath, chest pain, fever, cough at this phone interview.

## 2022-06-17 ENCOUNTER — Encounter (HOSPITAL_BASED_OUTPATIENT_CLINIC_OR_DEPARTMENT_OTHER): Payer: Self-pay | Admitting: Obstetrics and Gynecology

## 2022-06-18 NOTE — Anesthesia Preprocedure Evaluation (Addendum)
Anesthesia Evaluation  Patient identified by MRN, date of birth, ID band Patient awake    Reviewed: Allergy & Precautions, NPO status , Patient's Chart, lab work & pertinent test results  History of Anesthesia Complications Negative for: history of anesthetic complications  Airway Mallampati: II  TM Distance: >3 FB Neck ROM: Full    Dental  (+) Dental Advisory Given, Teeth Intact   Pulmonary former smoker   Pulmonary exam normal        Cardiovascular negative cardio ROS Normal cardiovascular exam     Neuro/Psych  Headaches PSYCHIATRIC DISORDERS Anxiety Depression     Neuromuscular disease    GI/Hepatic ,GERD  Medicated and Controlled,,(+) Cirrhosis   Esophageal Varices      Endo/Other  negative endocrine ROS    Renal/GU negative Renal ROS     Musculoskeletal  (+) Arthritis ,    Abdominal   Peds  Hematology  Plt 102k    Anesthesia Other Findings LLE reflex sympathetic dystrophy Chronic pain On GLP1-a  Reproductive/Obstetrics                             Anesthesia Physical Anesthesia Plan  ASA: 3  Anesthesia Plan: General   Post-op Pain Management: Minimal or no pain anticipated   Induction: Intravenous  PONV Risk Score and Plan: 3 and Treatment may vary due to age or medical condition, Ondansetron and Dexamethasone  Airway Management Planned: LMA  Additional Equipment: None  Intra-op Plan:   Post-operative Plan: Extubation in OR  Informed Consent: I have reviewed the patients History and Physical, chart, labs and discussed the procedure including the risks, benefits and alternatives for the proposed anesthesia with the patient or authorized representative who has indicated his/her understanding and acceptance.     Dental advisory given  Plan Discussed with: CRNA and Anesthesiologist  Anesthesia Plan Comments: ( )       Anesthesia Quick Evaluation

## 2022-06-18 NOTE — H&P (Addendum)
Preoperative History and Physical  Theresa Moran is an 62 y.o. female. G1P1 presenting for scheduled hysteroscopy, D&C, polypectomy. She initially presented with some postmenopausal spotting, EMB with benign endometrial polyp. Given that she is postmenopausal, we discussed would recommend evaluation of endometrial cavity and resection of remaining polypoid tissue.  Her PMH is significant for below - she was cleared for surgery by her hepatologist. Recent platelets 1/22 were 102.   Pertinent Gynecological History: Menses: postmenopausal Bleeding: post menopausal bleeding (spotting) Contraception: none Last pap: normal Date: 2023 OB History: G1P1  Menstrual History: No LMP recorded. Patient is postmenopausal.    Past Medical History:  Diagnosis Date   Allergy to environmental factors    Anxiety    Follows w/ PCP Dr. Franz Dell @ [facility].   Arthritis    ankles   Bilateral lower extremity edema 2023   Chronic pain    LLE reflex sympathetic dystrophy, follws with Pain Management, Lona Millard, PA.   COVID-19 03/08/2021   Depression    Follows w/ PCP.   Esophageal varices ([place])    Follows w/ Roosevelt Locks, NP @ [facility].   Gallstones    GERD (gastroesophageal reflux disease)    taking Protonix   Headache    stopped after menopause   Hemolytic anemia ([place])    Follows w/ Dr. Arletha Pili Iruku @ [place], see 06/15/22 OV in [place], s/p iron infusions   Hepatic encephalopathy ([place]) 10/11/2021   treated w/ lactulose and rifaximim   History of pneumonia 08/2021   Hospital admission on 09/22/21.   Liver cirrhosis secondary to [facility])    Follows w/ Dr. Delight Stare @ [place] and Roosevelt Locks, NP @ [facility].   Neuromuscular disorder (HCC)    reflex sympathetic dystrophy of LLE, follows w/ pain managemnt. Patient stated she broke her ankle again and nerve pain resolved.   PMB (postmenopausal bleeding) 2023    Thrombocytopenia ([place])    Follows w/ Dr. Arletha Pili Iruku @ [place].   Urticaria    Wears glasses     Past Surgical History:  Procedure Laterality Date   APPENDECTOMY  1979   BIOPSY  09/25/2021   Procedure: BIOPSY;  Surgeon: Yetta Flock, MD;  Location: [facility] ENDOSCOPY;  Service: Gastroenterology;;   BREAST SURGERY  1980   breast reduction   CHOLECYSTECTOMY     as a young adult   COLONOSCOPY WITH PROPOFOL N/A 09/25/2021   Procedure: COLONOSCOPY WITH PROPOFOL;  Surgeon: Yetta Flock, MD;  Location: [place];  Service: Gastroenterology;  Laterality: N/A;   ESOPHAGOGASTRODUODENOSCOPY N/A 09/25/2021   Procedure: ESOPHAGOGASTRODUODENOSCOPY (EGD);  Surgeon: Yetta Flock, MD;  Location: [facility] ENDOSCOPY;  Service: Gastroenterology;  Laterality: N/A;   NASAL SEPTUM SURGERY     when patient was in her 56's   ORIF ANKLE FRACTURE Left 03/26/2021   Procedure: OPEN REDUCTION INTERNAL FIXATION (ORIF) ANKLE FRACTURE;  Surgeon: Armond Hang, MD;  Location: [place];  Service: Orthopedics;  Laterality: Left;   POLYPECTOMY  09/25/2021   Procedure: POLYPECTOMY;  Surgeon: Yetta Flock, MD;  Location: [facility] ENDOSCOPY;  Service: Gastroenterology;;    Family History  Problem Relation Age of Onset   Intellectual disability Mother    Hypertension Mother    Hyperlipidemia Mother    Depression Mother    Arthritis Mother    Mental illness Mother    Heart disease Father    Diabetes Father    Stroke Father  Heart disease Brother    COPD Brother    Mental illness Daughter    Learning disabilities Daughter    Diabetes Daughter    Allergic rhinitis Neg Hx    Angioedema Neg Hx    Asthma Neg Hx    Eczema Neg Hx    Immunodeficiency Neg Hx    Urticaria Neg Hx    Colon cancer Neg Hx    Pancreatic cancer Neg Hx    Liver cancer Neg Hx    Stomach cancer Neg Hx     Social History:  reports that she quit smoking about 18 years ago. Her smoking use included cigarettes. She has  a 15.00 pack-year smoking history. She has never used smokeless tobacco. She reports that she does not currently use alcohol. She reports that she does not use drugs.  Allergies:  Allergies  Allergen Reactions   Shellfish Allergy Anaphylaxis, Hives, Itching, Shortness Of Breath and Swelling   Shellfish-Derived Products     No medications prior to admission.  There were no vitals taken for this visit. Vitals from last office visit reviewed, wnl.  Office physical exam:  Constitutional *General Appearance: healthy-appearing, well-nourished, well-developed  Female Genitalia Vulva: no masses, no atrophy, no lesions Mons: normal, no erythema, no excoriation, no atrophy, no lesions, no vesicles/ ulcers, no masses, no swelling, no tenderness Labia Majora: normal, no erythema, no excoriation, no atrophy, no discoloration, no lesions, no vesicles/ ulcers, no masses, no swelling, no tenderness Labia Minora: normal, no erythema, no excoriation, no atrophy, no discoloration, no lesions, no vesicles, no masses, no swelling, no tenderness Introitus: normal Bartholin's Gland: normal *Vagina: normal, no discharge, no blood present, no erythema, no atrophy, no lesions, no ulcers, no swelling, no masses, no tenderness, no prolapse Vaginal discharge no abnormal discharge present *Cervix: grossly normal, no lesions, no discharge, no bleeding, no cervical motion tenderness *Uterus: normal size, normal contour, midline, no uterine prolapse, mobile, non-tender, anteverted *Urethral Meatus/ Urethra: normal meatus, no discharge, well supported urethra, no masses, no tenderness *Bladder: non-distended, no palpable mass, non-tender *Adnexa/Parametria: no mass palpable, no tenderness  Neurological System Impressions: motor: no deficits, sensory: no deficits  Psychiatric *Orientation: to person, to place, to time *Mood and Affect: active and alert, normal mood, normal affect  Recent Results (from the past  2160 hour(s))  Basic metabolic panel     Status: Abnormal   Collection Time: 05/08/22  8:55 AM  Result Value Ref Range   Sodium 141 135 - 145 mEq/L   Potassium 4.1 3.5 - 5.1 mEq/L   Chloride 105 96 - 112 mEq/L   CO2 28 19 - 32 mEq/L   Glucose, Bld 110 (H) 70 - 99 mg/dL   BUN 14 6 - 23 mg/dL   Creatinine, Ser 0.80 0.40 - 1.20 mg/dL   GFR 78.26 >60.00 mL/min    Comment: Calculated using the CKD-EPI Creatinine Equation (2021)   Calcium 8.8 8.4 - 10.5 mg/dL  Hemoglobin A1c     Status: None   Collection Time: 05/08/22  8:55 AM  Result Value Ref Range   Hgb A1c MFr Bld 5.3 4.6 - 6.5 %    Comment: Glycemic Control Guidelines for People with Diabetes:Non Diabetic:  <6%Goal of Therapy: <7%Additional Action Suggested:  >8%   Lipid panel     Status: Abnormal   Collection Time: 05/08/22  8:55 AM  Result Value Ref Range   Cholesterol 200 0 - 200 mg/dL    Comment: ATP III Classification  Desirable:  < 200 mg/dL               Borderline High:  200 - 239 mg/dL          High:  > = 240 mg/dL   Triglycerides 101.0 0.0 - 149.0 mg/dL    Comment: Normal:  <150 mg/dLBorderline High:  150 - 199 mg/dL   HDL 60.10 >39.00 mg/dL   VLDL 20.2 0.0 - 40.0 mg/dL   LDL Cholesterol 119 (H) 0 - 99 mg/dL   Total CHOL/HDL Ratio 3     Comment:                Men          Women1/2 Average Risk     3.4          3.3Average Risk          5.0          4.42X Average Risk          9.6          7.13X Average Risk          15.0          11.0                       NonHDL 139.67     Comment: NOTE:  Non-HDL goal should be 30 mg/dL higher than patient's LDL goal (i.e. LDL goal of < 70 mg/dL, would have non-HDL goal of < 100 mg/dL)  CBC with Differential/Platelet     Status: Abnormal   Collection Time: 05/08/22  8:55 AM  Result Value Ref Range   WBC 5.5 4.0 - 10.5 K/uL   RBC 4.45 3.87 - 5.11 Mil/uL   Hemoglobin 14.6 12.0 - 15.0 g/dL   HCT 42.7 36.0 - 46.0 %   MCV 96.0 78.0 - 100.0 fl   MCHC 34.1 30.0 - 36.0 g/dL   RDW  14.9 11.5 - 15.5 %   Platelets 93.0 (L) 150.0 - 400.0 K/uL   Neutrophils Relative % 61.8 43.0 - 77.0 %   Lymphocytes Relative 26.5 12.0 - 46.0 %   Monocytes Relative 7.8 3.0 - 12.0 %   Eosinophils Relative 2.7 0.0 - 5.0 %   Basophils Relative 1.2 0.0 - 3.0 %   Neutro Abs 3.4 1.4 - 7.7 K/uL   Lymphs Abs 1.5 0.7 - 4.0 K/uL   Monocytes Absolute 0.4 0.1 - 1.0 K/uL   Eosinophils Absolute 0.1 0.0 - 0.7 K/uL   Basophils Absolute 0.1 0.0 - 0.1 K/uL  IBC + Ferritin     Status: Abnormal   Collection Time: 05/08/22  8:55 AM  Result Value Ref Range   Iron 124 42 - 145 ug/dL   Transferrin 210.0 (L) 212.0 - 360.0 mg/dL   Saturation Ratios 42.2 20.0 - 50.0 %   Ferritin 113.7 10.0 - 291.0 ng/mL   TIBC 294.0 250.0 - 450.0 mcg/dL  Vitamin B12     Status: None   Collection Time: 05/08/22  8:55 AM  Result Value Ref Range   Vitamin B-12 634 211 - 911 pg/mL  HM MAMMOGRAPHY     Status: None   Collection Time: 05/26/22 12:00 AM  Result Value Ref Range   HM Mammogram 0-4 Bi-Rad 0-4 Bi-Rad, Self Reported Normal    Comment: BI-RADS CATEGORY  1: Negative  CBC     Status: Abnormal   Collection Time: 05/26/22 11:06 AM  Result Value Ref Range  WBC 7.0 4.0 - 10.5 K/uL   RBC 4.54 3.87 - 5.11 Mil/uL   Platelets 99.0 (L) 150.0 - 400.0 K/uL   Hemoglobin 14.9 12.0 - 15.0 g/dL   HCT 43.2 36.0 - 46.0 %   MCV 95.3 78.0 - 100.0 fl   MCHC 34.5 30.0 - 36.0 g/dL   RDW 14.5 11.5 - 15.5 %  Sample to Blood Bank     Status: None   Collection Time: 06/15/22  8:31 AM  Result Value Ref Range   Blood Bank Specimen SAMPLE AVAILABLE FOR TESTING    Sample Expiration      06/18/2022,2359 Performed at [facility], [place] [address], [place], [place] [address]   Comprehensive metabolic panel     Status: Abnormal   Collection Time: 06/15/22  8:31 AM  Result Value Ref Range   Sodium 139 135 - 145 mmol/L   Potassium 4.4 3.5 - 5.1 mmol/L   Chloride 105 98 - 111 mmol/L   CO2 28 22 - 32 mmol/L   Glucose,  Bld 89 70 - 99 mg/dL    Comment: Glucose reference range applies only to samples taken after fasting for at least 8 hours.   BUN 15 8 - 23 mg/dL   Creatinine, Ser 1.16 (H) 0.44 - 1.00 mg/dL   Calcium 9.7 8.9 - 10.3 mg/dL   Total Protein 7.5 6.5 - 8.1 g/dL   Albumin 3.8 3.5 - 5.0 g/dL   AST 60 (H) 15 - 41 U/L   ALT 35 0 - 44 U/L   Alkaline Phosphatase 138 (H) 38 - 126 U/L   Total Bilirubin 1.2 0.3 - 1.2 mg/dL   GFR, Estimated 53 (L) >60 mL/min    Comment: (NOTE) Calculated using the CKD-EPI Creatinine Equation (2021)    Anion gap 6 5 - 15    Comment: Performed at [facility], [place] [address] [place], [place] [address]  CBC with Differential/Platelet     Status: Abnormal   Collection Time: 06/15/22  8:31 AM  Result Value Ref Range   WBC 6.8 4.0 - 10.5 K/uL   RBC 4.70 3.87 - 5.11 MIL/uL   Hemoglobin 15.2 (H) 12.0 - 15.0 g/dL   HCT 43.9 36.0 - 46.0 %   MCV 93.4 80.0 - 100.0 fL   MCH 32.3 26.0 - 34.0 pg   MCHC 34.6 30.0 - 36.0 g/dL   RDW 14.0 11.5 - 15.5 %   Platelets 102 (L) 150 - 400 K/uL   nRBC 0.0 0.0 - 0.2 %   Neutrophils Relative % 65 %   Neutro Abs 4.4 1.7 - 7.7 K/uL   Lymphocytes Relative 21 %   Lymphs Abs 1.4 0.7 - 4.0 K/uL   Monocytes Relative 8 %   Monocytes Absolute 0.5 0.1 - 1.0 K/uL   Eosinophils Relative 5 %   Eosinophils Absolute 0.4 0.0 - 0.5 K/uL   Basophils Relative 1 %   Basophils Absolute 0.1 0.0 - 0.1 K/uL   Immature Granulocytes 0 %   Abs Immature Granulocytes 0.02 0.00 - 0.07 K/uL    Comment: Performed at [facility], [place] [address], [place], [place] [address]  TSH     Status: None   Collection Time: 06/15/22  8:31 AM  Result Value Ref Range   TSH 1.972 0.350 - 4.500 uIU/mL    Comment: Performed by a 3rd Generation assay with a functional sensitivity of <=0.01 uIU/mL. Performed at [facility], [place]  [address] [place], [place] [address]     Assessment/Plan:  ALA KRATZ is  an 62 y.o. female. G1P1 presenting for scheduled hysteroscopy, D&C, polypectomy for endometrial polyp on EMB. Medical clearance provided by her hepatologist. Risks discussed including infection, bleeding, damage to surrounding structures, need for additional procedures, postoperative complications. All questions answered. Consent signed in clinic.    [name] 06/18/2022, 12:28 PM

## 2022-06-19 ENCOUNTER — Ambulatory Visit (HOSPITAL_BASED_OUTPATIENT_CLINIC_OR_DEPARTMENT_OTHER): Payer: Medicare (Managed Care) | Admitting: Anesthesiology

## 2022-06-19 ENCOUNTER — Other Ambulatory Visit: Payer: Self-pay

## 2022-06-19 ENCOUNTER — Ambulatory Visit (HOSPITAL_BASED_OUTPATIENT_CLINIC_OR_DEPARTMENT_OTHER)
Admission: RE | Admit: 2022-06-19 | Discharge: 2022-06-19 | Disposition: A | Discharge status: Home / Self Care | Payer: Medicare (Managed Care) | Attending: Obstetrics and Gynecology | Admitting: Obstetrics and Gynecology

## 2022-06-19 ENCOUNTER — Encounter (HOSPITAL_BASED_OUTPATIENT_CLINIC_OR_DEPARTMENT_OTHER): Payer: Self-pay | Admitting: Obstetrics and Gynecology

## 2022-06-19 ENCOUNTER — Encounter (HOSPITAL_BASED_OUTPATIENT_CLINIC_OR_DEPARTMENT_OTHER)
Admission: RE | Disposition: A | Discharge status: Home / Self Care | Payer: Self-pay | Source: Home / Self Care | Attending: Obstetrics and Gynecology

## 2022-06-19 DIAGNOSIS — Z780 Asymptomatic menopausal state: Secondary | ICD-10-CM | POA: Insufficient documentation

## 2022-06-19 DIAGNOSIS — K7581 Nonalcoholic steatohepatitis (NASH): Secondary | ICD-10-CM | POA: Diagnosis not present

## 2022-06-19 DIAGNOSIS — I8510 Secondary esophageal varices without bleeding: Secondary | ICD-10-CM | POA: Diagnosis not present

## 2022-06-19 DIAGNOSIS — Z01818 Encounter for other preprocedural examination: Secondary | ICD-10-CM

## 2022-06-19 DIAGNOSIS — K7460 Unspecified cirrhosis of liver: Secondary | ICD-10-CM

## 2022-06-19 DIAGNOSIS — N840 Polyp of corpus uteri: Secondary | ICD-10-CM

## 2022-06-19 DIAGNOSIS — K219 Gastro-esophageal reflux disease without esophagitis: Secondary | ICD-10-CM | POA: Insufficient documentation

## 2022-06-19 DIAGNOSIS — Z87891 Personal history of nicotine dependence: Secondary | ICD-10-CM | POA: Diagnosis not present

## 2022-06-19 DIAGNOSIS — F418 Other specified anxiety disorders: Secondary | ICD-10-CM

## 2022-06-19 HISTORY — DX: Esophageal varices without bleeding: I85.00

## 2022-06-19 HISTORY — DX: Presence of spectacles and contact lenses: Z97.3

## 2022-06-19 HISTORY — DX: Hereditary hemolytic anemia, unspecified: D58.9

## 2022-06-19 HISTORY — DX: Unspecified osteoarthritis, unspecified site: M19.90

## 2022-06-19 HISTORY — PX: DILATATION & CURETTAGE/HYSTEROSCOPY WITH MYOSURE: SHX6511

## 2022-06-19 HISTORY — DX: Thrombocytopenia, unspecified: D69.6

## 2022-06-19 HISTORY — DX: Unspecified cirrhosis of liver: K74.60

## 2022-06-19 HISTORY — DX: Other allergy status, other than to drugs and biological substances: Z91.09

## 2022-06-19 LAB — TYPE AND SCREEN
ABO/RH(D): O NEG
Antibody Screen: NEGATIVE

## 2022-06-19 SURGERY — DILATATION & CURETTAGE/HYSTEROSCOPY WITH MYOSURE
Anesthesia: General | Site: Uterus

## 2022-06-19 MED ORDER — PROPOFOL 10 MG/ML IV BOLUS
INTRAVENOUS | Status: AC
  Filled 2022-06-19: qty 20

## 2022-06-19 MED ORDER — ONDANSETRON HCL 4 MG/2ML IJ SOLN
INTRAMUSCULAR | Status: AC
  Filled 2022-06-19: qty 2

## 2022-06-19 MED ORDER — OXYCODONE HCL 5 MG/5ML PO SOLN: 5.0000 mg | Freq: Once | ORAL | Status: DC | PRN

## 2022-06-19 MED ORDER — LIDOCAINE HCL 1 % IJ SOLN
INTRAMUSCULAR | Status: DC | PRN
  Administered 2022-06-19: 10 mL

## 2022-06-19 MED ORDER — FENTANYL CITRATE (PF) 100 MCG/2ML IJ SOLN
INTRAMUSCULAR | Status: DC | PRN
  Administered 2022-06-19 (×4): 25 ug via INTRAVENOUS

## 2022-06-19 MED ORDER — FENTANYL CITRATE (PF) 100 MCG/2ML IJ SOLN
INTRAMUSCULAR | Status: AC
  Filled 2022-06-19: qty 2

## 2022-06-19 MED ORDER — MIDAZOLAM HCL 5 MG/5ML IJ SOLN
INTRAMUSCULAR | Status: DC | PRN
  Administered 2022-06-19: 2 mg via INTRAVENOUS

## 2022-06-19 MED ORDER — LIDOCAINE HCL (CARDIAC) PF 100 MG/5ML IV SOSY
PREFILLED_SYRINGE | INTRAVENOUS | Status: DC | PRN
  Administered 2022-06-19: 60 mg via INTRAVENOUS

## 2022-06-19 MED ORDER — MIDAZOLAM HCL 2 MG/2ML IJ SOLN
INTRAMUSCULAR | Status: AC
  Filled 2022-06-19: qty 2

## 2022-06-19 MED ORDER — SODIUM CHLORIDE 0.9 % IR SOLN
Status: DC | PRN
  Administered 2022-06-19: 3000 mL

## 2022-06-19 MED ORDER — LACTATED RINGERS IV SOLN: INTRAVENOUS | Status: DC

## 2022-06-19 MED ORDER — ONDANSETRON HCL 4 MG/2ML IJ SOLN: 4.0000 mg | Freq: Once | INTRAMUSCULAR | Status: DC | PRN

## 2022-06-19 MED ORDER — LIDOCAINE HCL (PF) 2 % IJ SOLN
INTRAMUSCULAR | Status: AC
  Filled 2022-06-19: qty 5

## 2022-06-19 MED ORDER — DEXAMETHASONE SODIUM PHOSPHATE 4 MG/ML IJ SOLN
INTRAMUSCULAR | Status: DC | PRN
  Administered 2022-06-19: 5 mg via INTRAVENOUS

## 2022-06-19 MED ORDER — DEXAMETHASONE SODIUM PHOSPHATE 10 MG/ML IJ SOLN
INTRAMUSCULAR | Status: AC
  Filled 2022-06-19: qty 1

## 2022-06-19 MED ORDER — ONDANSETRON HCL 4 MG/2ML IJ SOLN
INTRAMUSCULAR | Status: DC | PRN
  Administered 2022-06-19: 4 mg via INTRAVENOUS

## 2022-06-19 MED ORDER — PROPOFOL 10 MG/ML IV BOLUS
INTRAVENOUS | Status: DC | PRN
  Administered 2022-06-19: 200 mg via INTRAVENOUS

## 2022-06-19 MED ORDER — FENTANYL CITRATE (PF) 100 MCG/2ML IJ SOLN: 25.0000 ug | INTRAMUSCULAR | Status: DC | PRN

## 2022-06-19 MED ORDER — SILVER NITRATE-POT NITRATE 75-25 % EX MISC
CUTANEOUS | Status: DC | PRN
  Administered 2022-06-19: 1

## 2022-06-19 MED ORDER — OXYCODONE HCL 5 MG PO TABS: 5.0000 mg | ORAL_TABLET | Freq: Once | ORAL | Status: DC | PRN

## 2022-06-19 SURGICAL SUPPLY — 18 items
DEVICE MYOSURE LITE (MISCELLANEOUS) IMPLANT
DEVICE MYOSURE REACH (MISCELLANEOUS) IMPLANT
DILATOR CANAL MILEX (MISCELLANEOUS) IMPLANT
GLOVE BIOGEL PI IND STRL 7.0 (GLOVE) IMPLANT
GLOVE SS PI  5.5 STRL (GLOVE) ×1
GLOVE SS PI 5.5 STRL (GLOVE) ×1 IMPLANT
GOWN STRL REUS W/ TWL LRG LVL3 (GOWN DISPOSABLE) ×1 IMPLANT
GOWN STRL REUS W/TWL LRG LVL3 (GOWN DISPOSABLE) ×1 IMPLANT
IV NS IRRIG 3000ML ARTHROMATIC (IV SOLUTION) ×1 IMPLANT
KIT PROCEDURE FLUENT (KITS) ×1 IMPLANT
KIT TURNOVER CYSTO (KITS) ×1 IMPLANT
MYOSURE XL FIBROID (MISCELLANEOUS)
PACK VAGINAL MINOR WOMEN LF (CUSTOM PROCEDURE TRAY) ×1 IMPLANT
PAD OB MATERNITY 4.3X12.25 (PERSONAL CARE ITEMS) ×1 IMPLANT
PAD PREP 24X48 CUFFED NSTRL (MISCELLANEOUS) ×1 IMPLANT
SEAL CERVICAL OMNI LOK (ABLATOR) IMPLANT
SEAL ROD LENS SCOPE MYOSURE (ABLATOR) ×1 IMPLANT
SYSTEM TISS REMOVAL MYOSURE XL (MISCELLANEOUS) IMPLANT

## 2022-06-19 NOTE — Anesthesia Postprocedure Evaluation (Signed)
Anesthesia Post Note  Patient: Theresa Moran  Procedure(s) Performed: DILATATION & CURETTAGE/HYSTEROSCOPY WITH MYOSURE LITE (Uterus)     Patient location during evaluation: PACU Anesthesia Type: General Level of consciousness: awake and alert Pain management: pain level controlled Vital Signs Assessment: post-procedure vital signs reviewed and stable Respiratory status: spontaneous breathing, nonlabored ventilation and respiratory function stable Cardiovascular status: stable and blood pressure returned to baseline Anesthetic complications: no   No notable events documented.  Last Vitals:  Vitals:   06/19/22 0830 06/19/22 0845  BP: 121/64 136/86  Pulse: 97 99  Resp: 17 15  Temp:  36.6 C  SpO2: 100% 96%    Last Pain:  Vitals:   06/19/22 0845  TempSrc:   PainSc: [place]

## 2022-06-19 NOTE — Interval H&P Note (Signed)
History and Physical Interval Note:  06/19/2022 7:23 AM  Theresa Moran  has presented today for surgery, with the diagnosis of endometrial polyp.  The various methods of treatment have been discussed with the patient and family. After consideration of risks, benefits and other options for treatment, the patient has consented to  Procedure(s): DILATATION & CURETTAGE/HYSTEROSCOPY WITH POSSIBLE [place] (N/A) as a surgical intervention.  The patient's history has been reviewed, patient examined, no change in status, stable for surgery.  I have reviewed the patient's chart and labs.  Questions were answered to the patient's satisfaction.    [name]

## 2022-06-19 NOTE — Anesthesia Procedure Notes (Signed)
Procedure Name: LMA Insertion Date/Time: 06/19/2022 7:40 AM  Performed by: Justice Rocher, CRNAPre-anesthesia Checklist: Patient identified, Emergency Drugs available, Suction available, Patient being monitored and Timeout performed Patient Re-evaluated:Patient Re-evaluated prior to induction Oxygen Delivery Method: Circle system utilized Preoxygenation: Pre-oxygenation with 100% oxygen Induction Type: IV induction Ventilation: Mask ventilation without difficulty LMA: LMA inserted LMA Size: 4.0 Number of attempts: 1 Airway Equipment and Method: Bite block Placement Confirmation: positive ETCO2, breath sounds checked- equal and bilateral and CO2 detector Tube secured with: Tape Dental Injury: Teeth and Oropharynx as per pre-operative assessment

## 2022-06-19 NOTE — Op Note (Addendum)
PREOPERATIVE DIAGNOSES: 1. Endometrial polyp  POSTOPERATIVE DIAGNOSES: Same  PROCEDURE PERFORMED: Hysteroscopy, polypectomy, dilation and curettage  SURGEON: Dr. Hurshel Party  ANESTHESIA: Paracervical block, MAC  ESTIMATED BLOOD LOSS: 10cc.  FLUID DEFICIT: 35KKX  COMPLICATIONS: None  TUBES: None.  DRAINS: None  PATHOLOGY: Endometrial polyp, endometrial curettings  FINDINGS: On exam, under anesthesia, normal appearing vulva and vagina. Anteverted uterus. Operative findings with single polyp on anterior uterine wall. Otherwise atrophic-appearing endometrium, bilateral tubal ostia visualized.  Procedure: The patient was taken to the operating room where she was properly prepped and draped in sterile manner under general anesthesia. After bimanual examination, the cervix was exposed with a speculum and the anterior lip of the cervix grasped with a tenaculum. Paracervical block performed. The endocervical canal was found to be appropriately dilated. The hysteroscope was inserted and bilateral tubal ostia were visualized to confirm appropriate location. The myosure was introduced and the polyp was removed. Excellent hemostasis noted. The hysteroscope and myosure were removed. A curettage was then performed using a sharp curette with a gritty texture noted. All instruments were removed from vagina. Tenaculum sites were hemostatic with pressure and silver nitrate. The sponge and lap counts were correct times 2 at this time. The patient's procedure was terminated. We then awakened her. She was sent to the Recovery Room in good condition.   Hurshel Party, MD

## 2022-06-19 NOTE — Transfer of Care (Signed)
Immediate Anesthesia Transfer of Care Note  Patient: Theresa Moran  Procedure(s) Performed: Procedure(s) (LRB): DILATATION & CURETTAGE/HYSTEROSCOPY WITH MYOSURE LITE (N/A)  Patient Location: PACU  Anesthesia Type: General  Level of Consciousness: awake, sedated, patient cooperative and responds to stimulation  Airway & Oxygen Therapy: Patient Spontanous Breathing and Patient connected to [place] oxygen  Post-op Assessment: Report given to PACU RN, Post -op Vital signs reviewed and stable and Patient moving all extremities  Post vital signs: Reviewed and stable  Complications: No apparent anesthesia complications

## 2022-06-19 NOTE — Discharge Instructions (Signed)
   D & C Home care Instructions:   Personal hygiene:  Used sanitary napkins for vaginal drainage not tampons. Shower or tub bathe the day after your procedure. No douching until bleeding stops. Always wipe from front to back after  Elimination.  Activity: Do not drive or operate any equipment today. The effects of the anesthesia are still present and drowsiness may result. Rest today, not necessarily flat bed rest, just take it easy. You may resume your normal activity in one to 2 days.  Sexual activity: No intercourse for one week or as indicated by your physician  Diet: Eat a light diet as desired this evening. You may resume a regular diet tomorrow.  Return to work: One to 2 days.  General Expectations of your surgery: Vaginal bleeding should be no heavier than a normal period. Spotting may continue up to 10 days. Mild cramps may continue for a couple of days. You may have a regular period in 2-6 weeks.  Unexpected observations call your doctor if these occur: persistent or heavy bleeding. Severe abdominal cramping or pain. Elevation of temperature greater than 100F.      Post Anesthesia Home Care Instructions  Activity: Get plenty of rest for the remainder of the day. A responsible individual must stay with you for 24 hours following the procedure.  For the next 24 hours, DO NOT: -Drive a car -Paediatric nurse -Drink alcoholic beverages -Take any medication unless instructed by your physician -Make any legal decisions or sign important papers.  Meals: Start with liquid foods such as gelatin or soup. Progress to regular foods as tolerated. Avoid greasy, spicy, heavy foods. If nausea and/or vomiting occur, drink only clear liquids until the nausea and/or vomiting subsides. Call your physician if vomiting continues.  Special Instructions/Symptoms: Your throat may feel dry or sore from the anesthesia or the breathing tube placed in your throat during surgery. If this causes  discomfort, gargle with warm salt water. The discomfort should disappear within 24 hours.

## 2022-06-21 ENCOUNTER — Encounter: Payer: Self-pay | Admitting: Family Medicine

## 2022-06-22 ENCOUNTER — Encounter (HOSPITAL_BASED_OUTPATIENT_CLINIC_OR_DEPARTMENT_OTHER): Payer: Self-pay | Admitting: Obstetrics and Gynecology

## 2022-06-22 LAB — SURGICAL PATHOLOGY

## 2022-06-22 IMAGING — MR MR ABDOMEN WO/W CM
12 of 18 series · 29 of 48 positions shown · IV contrast (gadavist)
Comparison: CT on 09/11/2021

CLINICAL DATA: Hemolytic anemia.  Cirrhosis.  Recent iron infusion 2 days ago.

EXAM:
MRI ABDOMEN WITHOUT AND WITH CONTRAST
TECHNIQUE: Multiplanar multisequence MR imaging of the abdomen was performed
both before and after the administration of intravenous contrast.
CONTRAST:  10mL GADAVIST GADOBUTROL 1 MMOL/ML IV SOLN

[Series 3: cor haste · coronal · 6.0mm · 1.19mm/px · 2 of 30 slices shown]
[im 1/30]
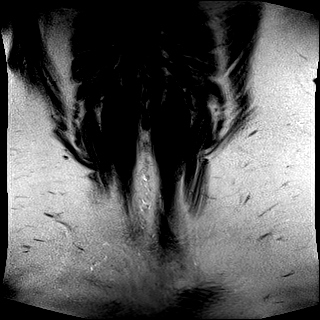
[im 30/30]
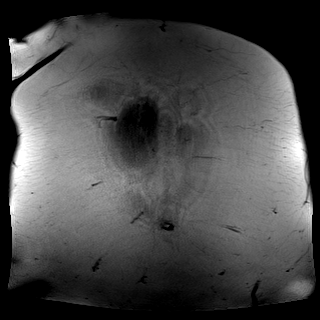

[Series 4: ax haste · axial · 6.0mm · 1.19mm/px · z∈[-68,+155]mm · 2 of 32 slices shown]
[im 1/32]
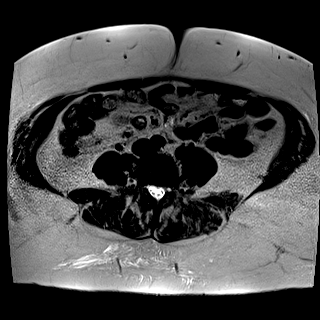
[im 32/32]
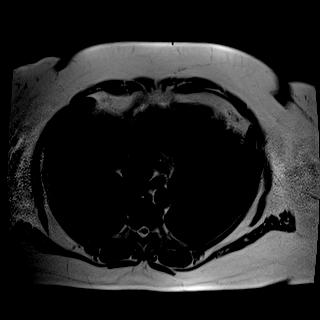

[Series 5: t1_vibe_opp-in_tra_p4_bh · axial · 3.0mm · 1.19mm/px · z∈[-54,+159]mm · 4 of 72 slices shown (1 of 2)]
[im 1/72]
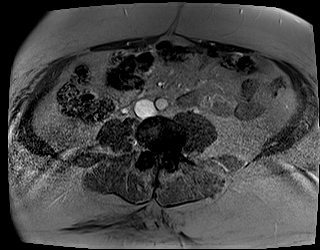
[im 24/72]
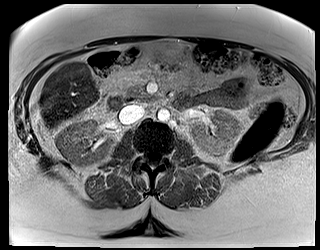
[im 48/72]
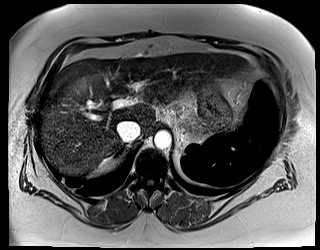
[im 72/72]
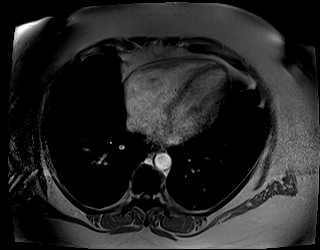

[Series 5: t1_vibe_opp-in_tra_p4_bh · axial · 3.0mm · 1.19mm/px · z∈[-54,+159]mm · 3 of 72 slices shown (2 of 2)]
[im 1/72]
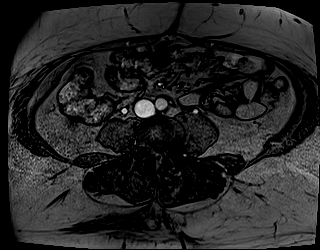
[im 36/72]
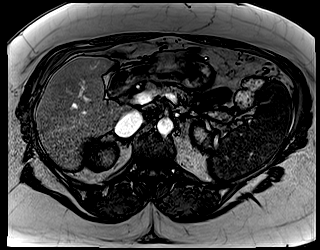
[im 72/72]
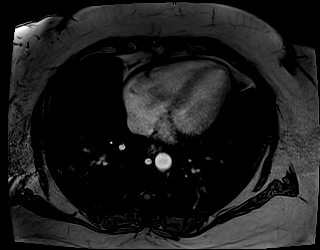

[Series 9: T2 fat-sat · axial · 6.0mm · 1.19mm/px · 1 of 30 slices shown]
[im 1/30]
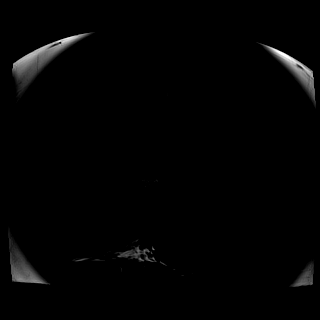

[Series 10: DWI · axial · 6.0mm · 1.42mm/px · z∈[-39,+170]mm · 4 of 90 slices shown (1 of 2)]
[im 1/90]
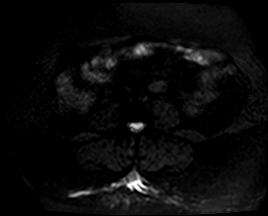
[im 30/90]
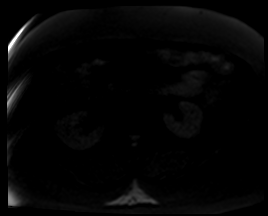
[im 60/90]
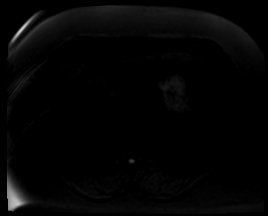
[im 90/90]
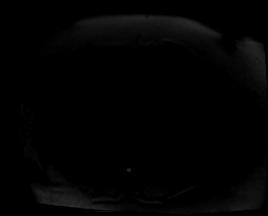

[Series 11: DWI · axial · 6.0mm · 1.42mm/px · 1 of 30 slices shown (2 of 2)]
[im 1/30]
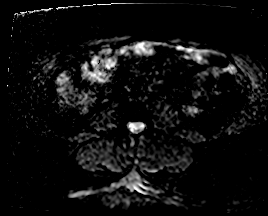

[Series 12: bSSFP · axial · 6.0mm · 0.74mm/px · 1 of 30 slices shown]
[im 1/30]
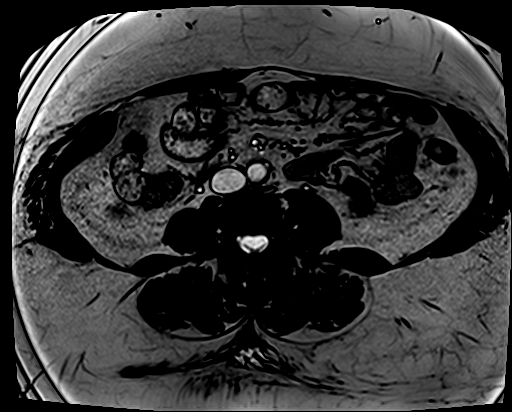

[Series 14: t1_vibe_fs_tra_p4_bh_pre · axial · 3.0mm · 0.62mm/px · z∈[-69,+168]mm · 3 of 80 slices shown]
[im 1/80]
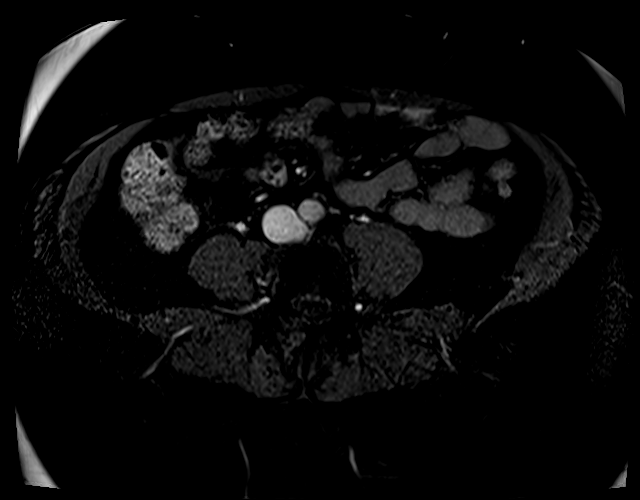
[im 40/80]
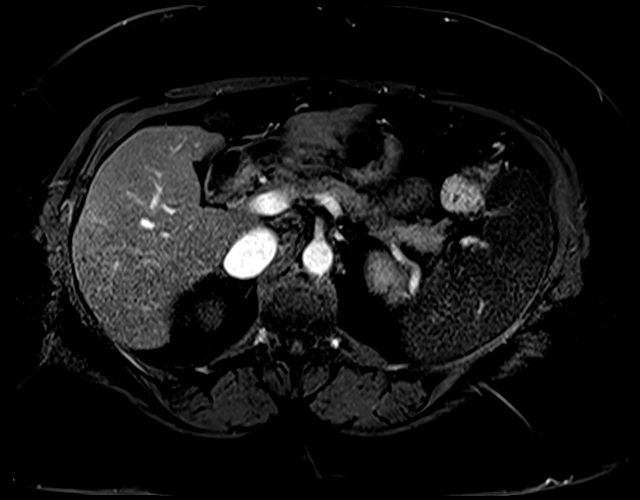
[im 80/80]
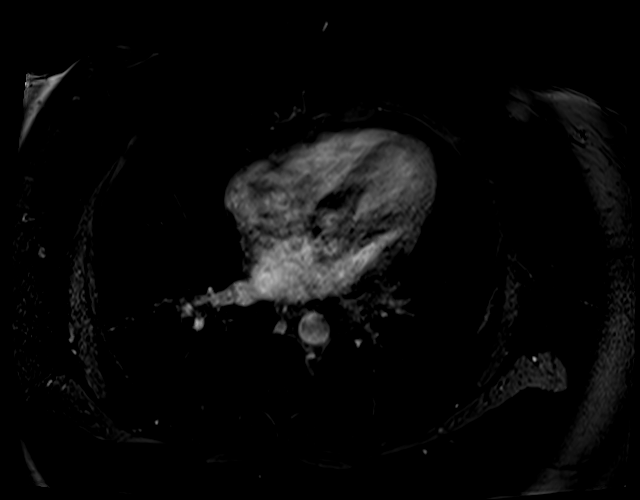

[Series 16: t1_vibe_fs_tra_p4_bh_post · axial · 3.0mm · 0.62mm/px · z∈[-69,+168]mm · 3 of 80 slices shown (1 of 2)]
[im 1/80]
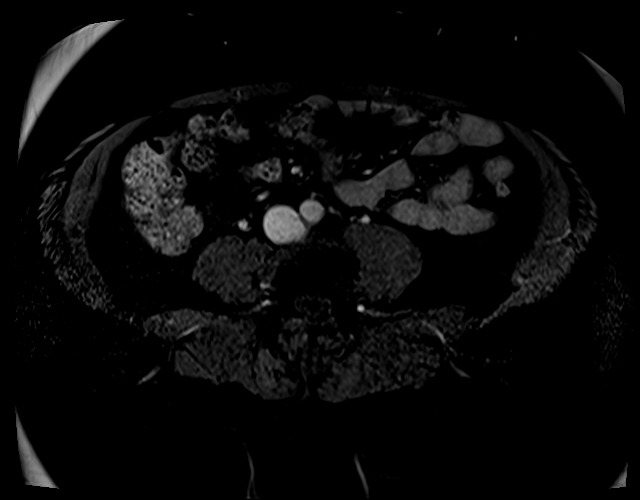
[im 40/80]
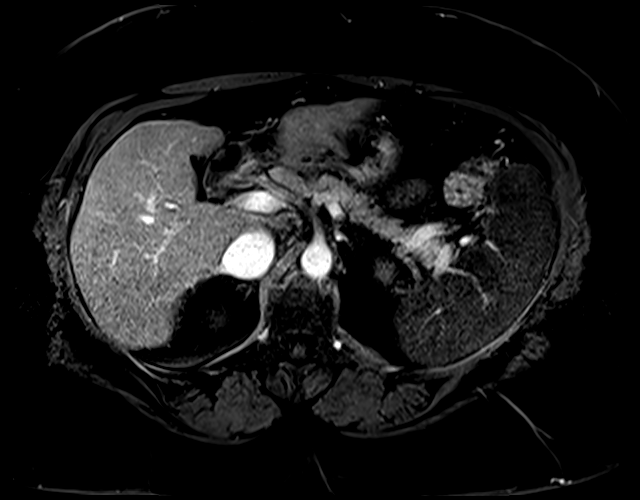
[im 80/80]
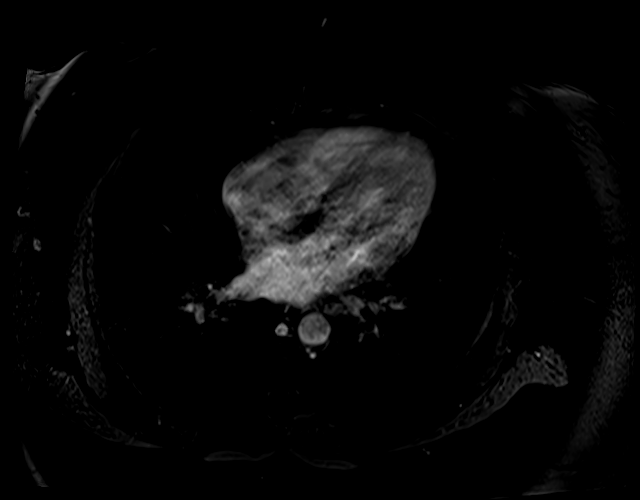

[Series 17: t1_vibe_fs_tra_p4_bh_post_sub · axial · 3.0mm · 0.62mm/px · z∈[-69,+168]mm · 3 of 80 slices shown]
[im 1/80]
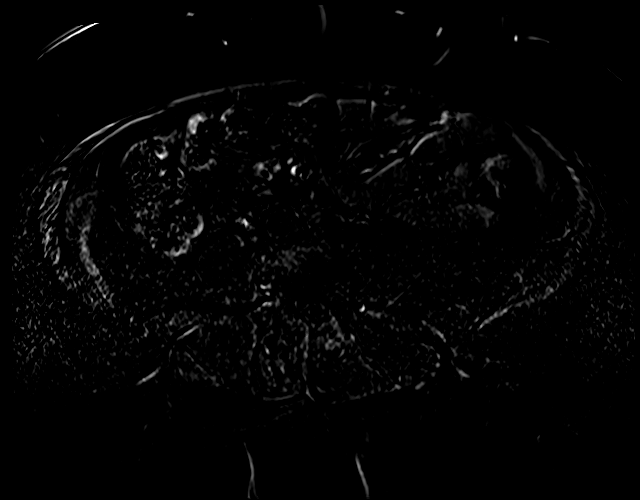
[im 40/80]
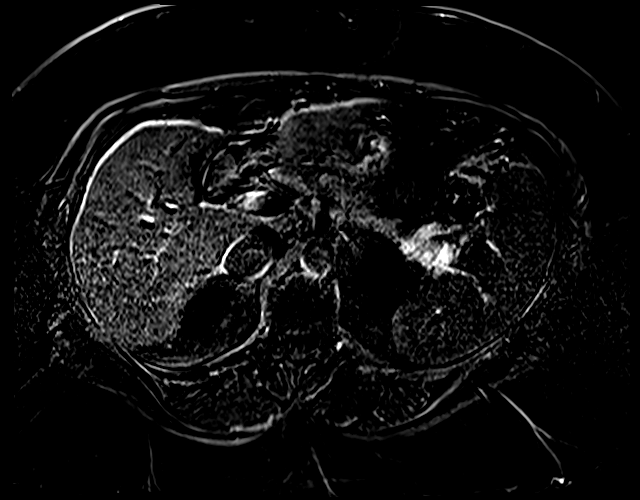
[im 80/80]
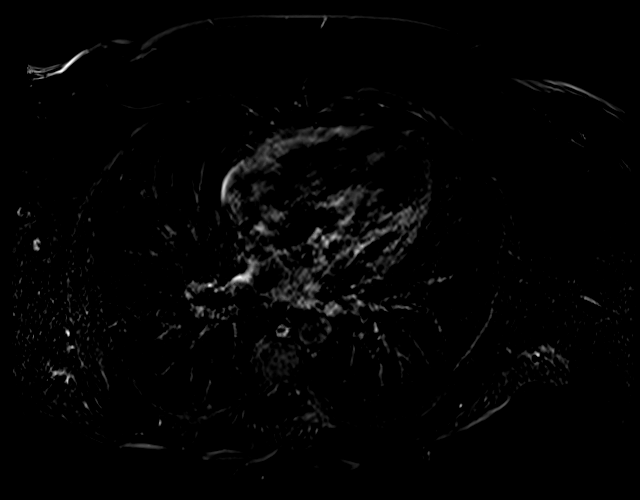

[Series 18: t1_vibe_fs_tra_p4_bh_post · axial · 3.0mm · 0.62mm/px · z∈[-69,+48]mm · 2 of 80 slices shown (2 of 2)]
[im 1/80]
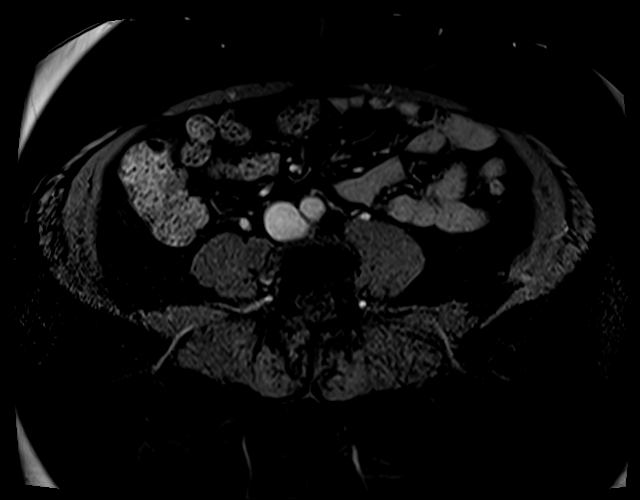
[im 40/80]
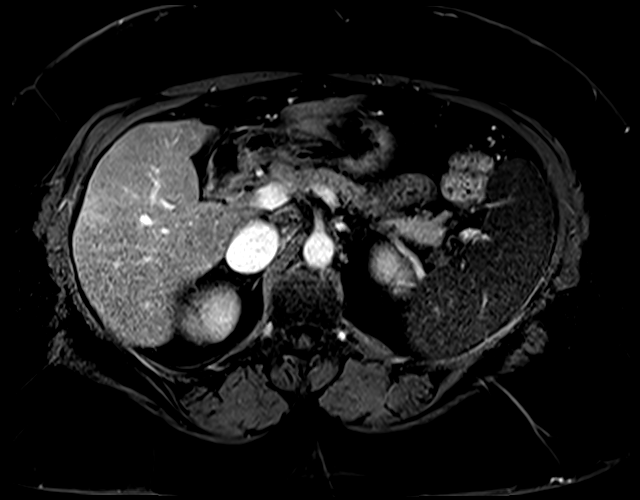

[29 of 48 positions shown; findings below may reference images not displayed]

FINDINGS: Lower chest: Mild bibasilar atelectasis.

Hepatobiliary: Caudate and left hepatic lobe hypertrophy and diffuse
capsular nodularity are consistent with cirrhosis. Diffuse T2
hypointensity of the hepatic parenchyma is consistent with
hemosiderosis. No hepatic masses are identified. No evidence of
ascites. Prior cholecystectomy. No evidence of biliary obstruction.

Pancreas:  Normal appearance.

Spleen: No evidence of splenomegaly. Diffuse T2 hypointensity of
splenic parenchyma is consistent with hemosiderosis. No masses
identified.

Adrenals/Urinary Tract: No masses identified. No evidence of
hydronephrosis.

Stomach/Bowel: Unremarkable.

Vascular/Lymphatic: No pathologically enlarged lymph nodes
identified. No acute vascular findings. Recanalization of
paraumbilical veins is consistent with portal venous hypertension.

Other:  None.

Musculoskeletal: No suspicious bone lesions identified. Diffuse bone
marrow T2 hypointensity, consistent with hemosiderosis.
IMPRESSION: Hepatic cirrhosis and findings of portal venous hypertension. No
evidence of hepatic neoplasm.

Transfusional siderosis.

## 2022-06-25 ENCOUNTER — Encounter: Payer: Self-pay | Admitting: Hematology and Oncology

## 2022-07-03 ENCOUNTER — Ambulatory Visit
Admission: RE | Admit: 2022-07-03 | Discharge: 2022-07-03 | Disposition: A | Discharge status: Home / Self Care | Payer: Medicare (Managed Care) | Source: Ambulatory Visit | Attending: Family Medicine | Admitting: Family Medicine

## 2022-07-03 DIAGNOSIS — Z1231 Encounter for screening mammogram for malignant neoplasm of breast: Secondary | ICD-10-CM | POA: Diagnosis not present

## 2022-07-03 LAB — HM MAMMOGRAPHY

## 2022-07-07 ENCOUNTER — Encounter: Payer: Self-pay | Admitting: Family Medicine

## 2022-08-06 ENCOUNTER — Encounter: Payer: Self-pay | Admitting: Family Medicine

## 2022-09-14 ENCOUNTER — Other Ambulatory Visit: Payer: Self-pay | Admitting: Family Medicine

## 2022-09-20 ENCOUNTER — Other Ambulatory Visit: Payer: Self-pay | Admitting: Family Medicine

## 2022-09-28 ENCOUNTER — Other Ambulatory Visit: Payer: Self-pay | Admitting: Nurse Practitioner

## 2022-09-28 DIAGNOSIS — K743 Primary biliary cirrhosis: Secondary | ICD-10-CM

## 2022-09-28 DIAGNOSIS — K7581 Nonalcoholic steatohepatitis (NASH): Secondary | ICD-10-CM

## 2022-09-28 LAB — HEPATIC FUNCTION PANEL
ALT: 33 U/L (ref 7–35)
AST: 58 — AB (ref 13–35)
Alkaline Phosphatase: 173 — AB (ref 25–125)
Bilirubin, Total: 1.1

## 2022-09-28 LAB — COMPREHENSIVE METABOLIC PANEL
Albumin: 4 (ref 3.5–5.0)
Calcium: 9.4 (ref 8.7–10.7)
Globulin: 3.1
eGFR: 68

## 2022-09-28 LAB — BASIC METABOLIC PANEL
BUN: 15 (ref 4–21)
CO2: 23 — AB (ref 13–22)
Chloride: 102 (ref 99–108)
Creatinine: 0.9 (ref 0.5–1.1)
Glucose: 82
Potassium: 4.4 mEq/L (ref 3.5–5.1)
Sodium: 140 (ref 137–147)

## 2022-10-04 ENCOUNTER — Other Ambulatory Visit: Payer: Self-pay | Admitting: Family Medicine

## 2022-10-20 ENCOUNTER — Ambulatory Visit (INDEPENDENT_AMBULATORY_CARE_PROVIDER_SITE_OTHER): Payer: Medicare (Managed Care) | Admitting: Family Medicine

## 2022-10-20 ENCOUNTER — Encounter: Payer: Medicare (Managed Care) | Admitting: Family Medicine

## 2022-10-20 ENCOUNTER — Encounter: Payer: Self-pay | Admitting: Family Medicine

## 2022-10-20 VITALS — BP 122/60 | HR 66 | Temp 98.3°F | Resp 18 | Ht 66.0 in | Wt 273.4 lb

## 2022-10-20 DIAGNOSIS — K7581 Nonalcoholic steatohepatitis (NASH): Secondary | ICD-10-CM

## 2022-10-20 DIAGNOSIS — D508 Other iron deficiency anemias: Secondary | ICD-10-CM | POA: Diagnosis not present

## 2022-10-20 DIAGNOSIS — G894 Chronic pain syndrome: Secondary | ICD-10-CM

## 2022-10-20 DIAGNOSIS — K7469 Other cirrhosis of liver: Secondary | ICD-10-CM | POA: Diagnosis not present

## 2022-10-20 DIAGNOSIS — D696 Thrombocytopenia, unspecified: Secondary | ICD-10-CM | POA: Diagnosis not present

## 2022-10-20 DIAGNOSIS — Z13220 Encounter for screening for lipoid disorders: Secondary | ICD-10-CM

## 2022-10-20 DIAGNOSIS — Z0000 Encounter for general adult medical examination without abnormal findings: Secondary | ICD-10-CM

## 2022-10-20 DIAGNOSIS — Z6841 Body Mass Index (BMI) 40.0 and over, adult: Secondary | ICD-10-CM

## 2022-10-20 HISTORY — DX: Nonalcoholic steatohepatitis (NASH): K75.81

## 2022-10-20 LAB — TSH: TSH: 1.67 u[IU]/mL (ref 0.35–5.50)

## 2022-10-20 LAB — LIPID PANEL
Cholesterol: 202 mg/dL — ABNORMAL HIGH (ref 0–200)
HDL: 61 mg/dL (ref >39.00)
LDL Cholesterol: 117 mg/dL — ABNORMAL HIGH (ref 0–99)
NonHDL: 141.06
Total CHOL/HDL Ratio: 3
Triglycerides: 120 mg/dL (ref 0.0–149.0)
VLDL: 24 mg/dL (ref 0.0–40.0)

## 2022-10-20 LAB — CBC WITH DIFFERENTIAL/PLATELET
Basophils Absolute: 0.1 K/uL (ref 0.0–0.1)
Basophils Relative: 1.5 % (ref 0.0–3.0)
Eosinophils Absolute: 0.2 K/uL (ref 0.0–0.7)
Eosinophils Relative: 3.6 % (ref 0.0–5.0)
HCT: 40.7 % (ref 36.0–46.0)
Hemoglobin: 13.6 g/dL (ref 12.0–15.0)
Lymphocytes Relative: 27.6 % (ref 12.0–46.0)
Lymphs Abs: 1.3 K/uL (ref 0.7–4.0)
MCHC: 33.5 g/dL (ref 30.0–36.0)
MCV: 96.4 fl (ref 78.0–100.0)
Monocytes Absolute: 0.4 K/uL (ref 0.1–1.0)
Monocytes Relative: 8 % (ref 3.0–12.0)
Neutro Abs: 2.8 K/uL (ref 1.4–7.7)
Neutrophils Relative %: 59.3 % (ref 43.0–77.0)
Platelets: 94 K/uL — ABNORMAL LOW (ref 150.0–400.0)
RBC: 4.22 Mil/uL (ref 3.87–5.11)
RDW: 15 % (ref 11.5–15.5)
WBC: 4.8 K/uL (ref 4.0–10.5)

## 2022-10-20 LAB — VITAMIN B12: Vitamin B-12: 667 pg/mL (ref 211–911)

## 2022-10-20 LAB — HEMOGLOBIN A1C: Hgb A1c MFr Bld: 5.5 % (ref 4.6–6.5)

## 2022-10-20 MED ORDER — NORTRIPTYLINE HCL 50 MG PO CAPS: 100.0000 mg | ORAL_CAPSULE | Freq: Every day | ORAL | 1 refills | Status: DC

## 2022-10-20 MED ORDER — PANTOPRAZOLE SODIUM 40 MG PO TBEC: DELAYED_RELEASE_TABLET | ORAL | 3 refills | Status: DC

## 2022-10-20 MED ORDER — BACLOFEN 20 MG PO TABS: 20.0000 mg | ORAL_TABLET | Freq: Two times a day (BID) | ORAL | 3 refills | Status: DC

## 2022-10-20 NOTE — Assessment & Plan Note (Signed)
Chronic.  Improved.  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  Check cbc

## 2022-10-20 NOTE — Assessment & Plan Note (Signed)
Chronic.  Controlled.  Continue coreg 3.125 mg bid, xifaxan 550mg  bic, lasix 20 mg daily, spironolactone 25 mg daily, lactulose.  Work on weight loss.

## 2022-10-20 NOTE — Patient Instructions (Addendum)
It was very nice to see you today!  Zepbound,  [id number] for weight loss only.  Look at patient assistance for the above.  Get shingles shots through pharmacy.     PLEASE NOTE:  If you had any lab tests please let us know if you have not heard back within a few days. You may see your results on MyChart before we have a chance to review them but we will give you a call once they are reviewed by [place]. If we ordered any referrals today, please let us know if you have not heard from their office within the next week.   Please try these tips to maintain a healthy lifestyle:  Eat most of your calories during the day when you are active. Eliminate processed foods including packaged sweets (pies, cakes, cookies), reduce intake of potatoes, white bread, white pasta, and white rice. Look for whole grain options, oat flour or almond flour.  Each meal should contain half fruits/vegetables, one quarter protein, and one quarter carbs (no bigger than a computer mouse).  Cut down on sweet beverages. This includes juice, soda, and sweet tea. Also watch fruit intake, though this is a healthier sweet option, it still contains natural sugar! Limit to 3 servings daily.  Drink at least 1 glass of water with each meal and aim for at least 8 glasses per day  Exercise at least 150 minutes every week.

## 2022-10-20 NOTE — Progress Notes (Addendum)
Phone [phone]   Subjective:   Patient is a 62 y.o. female presenting for annual physical.    Chief Complaint  Patient presents with   Annual Exam    CPE Not fasting, had a banana and coffee   Annual-exercising 3x/week(s) .  Safety features in place.  MASH-seeing liver xplant.  Patient joined [employer] as well.  Insurance not covering Mounjaro(which really changed things around).   Cirrhosis-was decompensated.  Has varicies.  On coreg.  Seeing gastroenterology-doing very well.   Chronic pain-baclofen twice daily.  Patient off pain medications.   Anemia-improved.  More from Cirrhosis.    See problem oriented charting- ROS- ROS: Gen: no fever, chills  Skin: no rash, itching ENT: no ear pain, ear drainage, nasal congestion, rhinorrhea, sinus pressure, sore throat Eyes: no blurry vision, double vision Resp: no cough, wheeze,SOB CV: no CP, palpitations, LE edema,  GI: no heartburn, n/v/d/c,.   Occasional RUQ if eats wrong things.  GU: no dysuria, urgency, frequency, hematuria MSK: +joint pain.  Tolerable w/weight loss.     Has chair lift for stairs.  Safety features in place.   Neuro: no , headache, weakness, vertigo.  Dizziness when bends over Psych: no depression, anxiety, insomnia, SI   The following were reviewed and entered/updated in epic: Past Medical History:  Diagnosis Date   Allergy to environmental factors    Anxiety    Follows w/ PCP Dr. Rondel Jumbo @ [facility].   Arthritis    ankles   Bilateral lower extremity edema 2023   Chronic pain    LLE reflex sympathetic dystrophy, follws with Pain Management, Yehuda Budd, PA.   COVID-19 03/08/2021   Depression    Follows w/ PCP.   Esophageal varices (HCC)    Follows w/ Annamarie Major, NP @ [facility].   Gallstones    GERD (gastroesophageal reflux disease)    taking Protonix   Headache    stopped after menopause   Hemolytic anemia (HCC)    Follows w/ Dr. Burnice Logan Iruku @ [facility],  see 06/15/22 OV in Epic, s/p iron infusions   Hepatic encephalopathy (HCC) 10/11/2021   treated w/ lactulose and rifaximim   History of pneumonia 08/2021   Hospital admission on 09/22/21.   Liver cirrhosis secondary to NASH [facility])    Follows w/ Dr. Leighton Parody @ [facility] and Annamarie Major, NP @ [facility].   Neuromuscular disorder (HCC)    reflex sympathetic dystrophy of LLE, follows w/ pain managemnt. Patient stated she broke her ankle again and nerve pain resolved.   PMB (postmenopausal bleeding) 2023   Thrombocytopenia (HCC)    Follows w/ Dr. Burnice Logan Iruku @ [facility].   Urticaria    Wears glasses    Patient Active Problem List   Diagnosis Date Noted   Metabolic dysfunction-associated steatohepatitis (MASH) 10/20/2022   Morbid obesity (HCC) 10/11/2021   IDA (iron deficiency anemia) 09/29/2021   Gastritis and gastroduodenitis    Benign neoplasm of colon    Dysphagia    CAP (community acquired pneumonia) 09/21/2021   SIRS (systemic inflammatory response syndrome) (HCC) 09/21/2021   Absolute anemia 09/21/2021   Thrombocytopenia (HCC) 09/21/2021   Morbid obesity with BMI of 50.0-59.9, adult (HCC) 09/21/2021   Cirrhosis of liver (HCC) 09/21/2021   Bilateral lower extremity edema 09/21/2021   Tachycardia 09/21/2021   Postoperative state 03/26/2021   Closed trimalleolar fracture of left ankle 03/21/2021   H/O food anaphylaxis 02/12/2020   Adverse  food reaction 03/13/2016   Allergic rhinitis 03/13/2016   Chronic pain 03/08/2014   Reflex sympathetic dystrophy of lower extremity 09/13/2013   Depressive disorder 12/23/2012   Past Surgical History:  Procedure Laterality Date   APPENDECTOMY  1979   BIOPSY  09/25/2021   Procedure: BIOPSY;  Surgeon: Benancio Deeds, MD;  Location: [facility] ENDOSCOPY;  Service: Gastroenterology;;   BREAST SURGERY  1980   breast reduction   CHOLECYSTECTOMY     as a young adult   COLONOSCOPY WITH PROPOFOL N/A  09/25/2021   Procedure: COLONOSCOPY WITH PROPOFOL;  Surgeon: Benancio Deeds, MD;  Location: [facility] ENDOSCOPY;  Service: Gastroenterology;  Laterality: N/A;   DILATATION & CURETTAGE/HYSTEROSCOPY WITH MYOSURE N/A 06/19/2022   Procedure: DILATATION & CURETTAGE/HYSTEROSCOPY WITH MYOSURE LITE;  Surgeon: [name], MD;  Location: [facility];  Service: Gynecology;  Laterality: N/A;   ESOPHAGOGASTRODUODENOSCOPY N/A 09/25/2021   Procedure: ESOPHAGOGASTRODUODENOSCOPY (EGD);  Surgeon: Benancio Deeds, MD;  Location: [facility] ENDOSCOPY;  Service: Gastroenterology;  Laterality: N/A;   NASAL SEPTUM SURGERY     when patient was in her 26's   ORIF ANKLE FRACTURE Left 03/26/2021   Procedure: OPEN REDUCTION INTERNAL FIXATION (ORIF) ANKLE FRACTURE;  Surgeon: Netta Cedars, MD;  Location: [facility] OR;  Service: Orthopedics;  Laterality: Left;   POLYPECTOMY  09/25/2021   Procedure: POLYPECTOMY;  Surgeon: Benancio Deeds, MD;  Location: [facility] ENDOSCOPY;  Service: Gastroenterology;;    Family History  Problem Relation Age of Onset   Intellectual disability Mother    Hypertension Mother    Hyperlipidemia Mother    Depression Mother    Arthritis Mother    Mental illness Mother    Heart disease Father    Diabetes Father    Stroke Father    Heart disease Brother    COPD Brother    Mental illness Daughter    Learning disabilities Daughter    Diabetes Daughter    Allergic rhinitis Neg Hx    Angioedema Neg Hx    Asthma Neg Hx    Eczema Neg Hx    Immunodeficiency Neg Hx    Urticaria Neg Hx    Colon cancer Neg Hx    Pancreatic cancer Neg Hx    Liver cancer Neg Hx    Stomach cancer Neg Hx     Medications- reviewed and updated Current Outpatient Medications  Medication Sig Dispense Refill   busPIRone (BUSPAR) 10 MG tablet TAKE 1 TABLET(10 MG) BY MOUTH TWICE DAILY 180 tablet 1   carvedilol (COREG) 3.125 MG tablet TAKE 1 TABLET(3.125 MG) BY MOUTH TWICE DAILY WITH A MEAL 180 tablet 1    EPINEPHrine 0.3 mg/0.3 mL IJ SOAJ injection Inject into the muscle.     furosemide (LASIX) 20 MG tablet TAKE 1 TABLET(20 MG) BY MOUTH DAILY 90 tablet 1   lactulose (CHRONULAC) 10 GM/15ML solution Take 30 mLs by mouth 3 (three) times daily. Takes 2 to 3 times daily.     rifaximin (XIFAXAN) 550 MG TABS tablet Take 550 mg by mouth in the morning and at bedtime.     spironolactone (ALDACTONE) 25 MG tablet TAKE 1 TABLET(25 MG) BY MOUTH DAILY 90 tablet 1   baclofen (LIORESAL) 20 MG tablet Take 1 tablet (20 mg total) by mouth 2 (two) times daily. 180 each 3   nortriptyline (PAMELOR) 50 MG capsule Take by mouth.     nortriptyline (PAMELOR) 50 MG capsule Take 100 mg by mouth at bedtime.     nortriptyline (PAMELOR)  50 MG capsule Take 2 capsules (100 mg total) by mouth at bedtime. 180 capsule 1   pantoprazole (PROTONIX) 40 MG tablet TAKE 1 TABLET(40 MG) BY MOUTH DAILY 90 tablet 3   No current facility-administered medications for this visit.    Allergies-reviewed and updated Allergies  Allergen Reactions   Shellfish Allergy Anaphylaxis, Hives, Itching, Shortness Of Breath and Swelling   Amitriptyline     "I don't remember."   Iodine Hives    Other Reaction(s): Other (See Comments)   Shellfish-Derived Products     Social History   Social History Narrative   Has 1 child(artificial insem), 2 adopted, and 1 step from partner's former partner   Raising 4 grandchildren.   Objective  Objective:  BP 122/60   Pulse 66   Temp 98.3 F (36.8 C) (Temporal)   Resp 18   Ht 5\' 6"  (1.676 m)   Wt 273 lb 6 oz (124 kg)   SpO2 98%   BMI 44.12 kg/m  Physical Exam  Gen: WDWN NAD HEENT: NCAT, conjunctiva not injected, sclera nonicteric TM WNL B, OP moist, no exudates  NECK:  supple, no thyromegaly, no nodes, no carotid bruits CARDIAC: RRR, S1S2+, no murmur. DP 2+B LUNGS: CTAB. No wheezes ABDOMEN:  BS+, soft, NTND, No HSM, no masses EXT:  1+ edema MSK: no gross abnormalities. MS 5/5 all 4 NEURO: A&O  x3.  CN II-XII intact.  PSYCH: normal mood. Good eye contact   Reviewed Liver xplant notes.   Glucose 82 70 - 99 mg/dL     [facility] 1    Urea Nitrogen, Blood (BUN) 15 8 - 27 mg/dL     [facility] 1    Creatinine 0.94 0.57 - 1.00 mg/dL     [facility] 1    eGFR 68 >59 mL/min/1.73     [facility] 1    Blood Urea Nitrogen (BUN)/Creatinine Ratio 16 12 - 28     [facility] 1    Sodium 140 134 - 144 mmol/L     [facility] 1    Potassium 4.4 3.5 - 5.2 mmol/L     [facility] 1    Chloride 102 96 - 106 mmol/L     [facility] 1    CO2 23 20 - 29 mmol/L     [facility] 1    Calcium 9.4 8.7 - 10.3 mg/dL     [facility] 1    Protein, Total 7.1 6.0 - 8.5 g/dL     [facility] 1    Albumin 4.0 3.9 - 4.9 g/dL     [facility] 1    Globulin, Total 3.1 1.5 - 4.5 g/dL     [facility] 1    A/G Ratio 1.3 1.2 - 2.2     [facility] 1    Bilirubin, Total 1.1 0.0 - 1.2 mg/dL     [facility] 1    Alkaline Phosphatase 173 (H) 44 - 121 IU/L     [facility] 1    AST (SGOT) 58 (H) 0 - 40 IU/L     [facility] 1    ALT (SGPT) 33 (H) 0 - 32 IU/L          Assessment and Plan   Health Maintenance counseling: 1. Anticipatory guidance: Patient counseled regarding regular dental exams q6 months, eye exams,  avoiding smoking and second hand smoke, limiting alcohol to 1 beverage per day, no illicit drugs.   2. Risk factor reduction:  Advised patient of need for regular exercise and diet rich and fruits and vegetables to  reduce risk of heart attack and stroke. Exercise- 3x/wk.  Wt Readings from Last 3 Encounters:  10/20/22 273 lb 6 oz (124 kg)  06/19/22 272 lb 11.2 oz (123.7 kg)  06/15/22 267 lb 12.8 oz (121.5 kg)   3. Immunizations/screenings/ancillary studies Immunization History  Administered Date(s) Administered   Fluad Quad(high Dose 65+) 05/08/2022   Hepatitis A, Adult 10/15/2021   Hepatitis B, ADULT 12/04/2021   Hepb-cpg 10/15/2021   Influenza Split 02/28/2013   Influenza,inj,Quad PF,6+ Mos 03/22/2018, 01/15/2020   Janssen (J&J) SARS-COV-2 Vaccination 08/29/2019    Moderna SARS-COV2 Booster Vaccination 04/28/2020   Tdap 03/22/2018   Health Maintenance Due  Topic Date Due   Medicare Annual Wellness (AWV)  11/12/2022    4. Cervical cancer screening- 2023-Dr. Imogene Burn 5. Breast cancer screening-  mammogram UTD 6. Colon cancer screening - UTD 7. Skin cancer screening- advised regular sunscreen use. Denies worrisome, changing, or new skin lesions.  8. Birth control/STD check- n/a 9. Osteoporosis screening- n/a 10. Smoking associated screening - non smoker  Wellness examination -     CBC with Differential/Platelet -     Lipid panel -     TSH -     Hemoglobin A1c  Metabolic dysfunction-associated steatohepatitis (MASH) Assessment & Plan: Chronic.  Mostly controlled.  Seeing liver xplant.  LFT's have increased some since being off Mounjaro(has gained few pounds).  Continue medications.  Has ultrasound end of week(s).   Orders: -     Lipid panel -     TSH -     Hemoglobin A1c -     Referral to Nutrition and Diabetes Services  Other cirrhosis of liver (HCC) Assessment & Plan: Chronic.  Controlled.  Continue coreg 3.125 mg bid, xifaxan 550mg  bic, lasix 20 mg daily, spironolactone 25 mg daily, lactulose.  Work on weight loss.    Thrombocytopenia (HCC) Assessment & Plan: Chronic.  Improved.  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  Check cbc  Orders: -     CBC with Differential/Platelet  Other iron deficiency anemia -     CBC with Differential/Platelet -     Iron, TIBC and Ferritin Panel -     Vitamin B12  Morbid obesity with BMI of 40.0-44.9, adult (HCC) -     Referral to Nutrition and Diabetes Services  Chronic pain syndrome Assessment & Plan: Chronic.  Controlled.  Only on baclofen twice daily-continue exercise as well and weight loss   Other orders -     Baclofen; Take 1 tablet (20 mg total) by mouth 2 (two) times daily.  Dispense: 180 each; Refill: 3 -     Pantoprazole Sodium; TAKE 1 TABLET(40 MG) BY MOUTH DAILY   Dispense: 90 tablet; Refill: 3 -     Nortriptyline HCl; Take 2 capsules (100 mg total) by mouth at bedtime.  Dispense: 180 capsule; Refill: 1  Wellness-antic guidance.  Get shingrix at pharmacy.   Recommended follow up: Return in about 6 months (around 04/22/2023) for chronic follow-up.  Lab/Order associations:+ fasting  Angelena Sole, MD

## 2022-10-20 NOTE — Assessment & Plan Note (Signed)
Chronic.  Mostly controlled.  Seeing liver xplant.  LFT's have increased some since being off Mounjaro(has gained few pounds).  Continue medications.  Has ultrasound end of week(s).

## 2022-10-20 NOTE — Assessment & Plan Note (Signed)
Chronic.  Controlled.  Only on baclofen twice daily-continue exercise as well and weight loss

## 2022-10-21 LAB — IRON,TIBC AND FERRITIN PANEL
%SAT: 21 % (calc) (ref 16–45)
Ferritin: 111 ng/mL (ref 16–288)
Iron: 58 ug/dL (ref 45–160)
TIBC: 270 mcg/dL (calc) (ref 250–450)

## 2022-10-21 NOTE — Progress Notes (Signed)
Labs ok but hemoglobin has decreased from 4 months ago-repeat 2-3 weeks for trend

## 2022-10-22 ENCOUNTER — Other Ambulatory Visit: Payer: Self-pay | Admitting: *Deleted

## 2022-10-22 DIAGNOSIS — D649 Anemia, unspecified: Secondary | ICD-10-CM

## 2022-10-23 ENCOUNTER — Ambulatory Visit
Admission: RE | Admit: 2022-10-23 | Discharge: 2022-10-23 | Disposition: A | Discharge status: Home / Self Care | Payer: Medicare (Managed Care) | Source: Ambulatory Visit | Attending: Nurse Practitioner | Admitting: Nurse Practitioner

## 2022-10-23 DIAGNOSIS — K7460 Unspecified cirrhosis of liver: Secondary | ICD-10-CM | POA: Diagnosis not present

## 2022-10-23 DIAGNOSIS — K743 Primary biliary cirrhosis: Secondary | ICD-10-CM

## 2022-11-06 ENCOUNTER — Ambulatory Visit: Payer: Medicare Other | Admitting: Family Medicine

## 2022-11-13 ENCOUNTER — Other Ambulatory Visit (INDEPENDENT_AMBULATORY_CARE_PROVIDER_SITE_OTHER): Payer: Medicare (Managed Care)

## 2022-11-13 DIAGNOSIS — D649 Anemia, unspecified: Secondary | ICD-10-CM | POA: Diagnosis not present

## 2022-11-13 LAB — CBC WITH DIFFERENTIAL/PLATELET
Basophils Absolute: 0.1 10*3/uL (ref 0.0–0.1)
Basophils Relative: 1.1 % (ref 0.0–3.0)
Eosinophils Absolute: 0.2 10*3/uL (ref 0.0–0.7)
Eosinophils Relative: 3.4 % (ref 0.0–5.0)
HCT: 40.3 % (ref 36.0–46.0)
Hemoglobin: 13.3 g/dL (ref 12.0–15.0)
Lymphocytes Relative: 26.1 % (ref 12.0–46.0)
Lymphs Abs: 1.3 10*3/uL (ref 0.7–4.0)
MCHC: 33.1 g/dL (ref 30.0–36.0)
MCV: 96.2 fl (ref 78.0–100.0)
Monocytes Absolute: 0.4 10*3/uL (ref 0.1–1.0)
Monocytes Relative: 7.9 % (ref 3.0–12.0)
Neutro Abs: 3.1 10*3/uL (ref 1.4–7.7)
Neutrophils Relative %: 61.5 % (ref 43.0–77.0)
Platelets: 89 10*3/uL — ABNORMAL LOW (ref 150.0–400.0)
RBC: 4.19 Mil/uL (ref 3.87–5.11)
RDW: 15 % (ref 11.5–15.5)
WBC: 5 10*3/uL (ref 4.0–10.5)

## 2022-11-14 NOTE — Progress Notes (Signed)
Stable but reck 1 month

## 2022-11-17 ENCOUNTER — Other Ambulatory Visit: Payer: Self-pay | Admitting: *Deleted

## 2022-11-17 DIAGNOSIS — D649 Anemia, unspecified: Secondary | ICD-10-CM

## 2022-12-04 ENCOUNTER — Telehealth: Payer: Self-pay | Admitting: Family Medicine

## 2022-12-09 NOTE — Telephone Encounter (Signed)
Error

## 2022-12-14 ENCOUNTER — Ambulatory Visit (INDEPENDENT_AMBULATORY_CARE_PROVIDER_SITE_OTHER): Payer: Medicare (Managed Care)

## 2022-12-14 VITALS — Wt 273.0 lb

## 2022-12-14 DIAGNOSIS — Z0000 Encounter for general adult medical examination without abnormal findings: Secondary | ICD-10-CM

## 2022-12-14 NOTE — Progress Notes (Addendum)
Subjective:   Theresa Moran is a 62 y.o. female who presents for Medicare Annual (Subsequent) preventive examination.  Per patient no change in vitals since last visit, unable to obtain new vitals due to telehealth visit   Visit Complete: Virtual  I connected with  Theresa Moran on 12/14/22 by a audio enabled telemedicine application and verified that I am speaking with the correct person using two identifiers.  Patient Location: Home  Provider Location: Office/Clinic  I discussed the limitations of evaluation and management by telemedicine. The patient expressed understanding and agreed to proceed.   Review of Systems     Cardiac Risk Factors include: advanced age (>26men, >56 women);obesity (BMI >30kg/m2)     Objective:    Today's Vitals   12/14/22 1036  Weight: 273 lb (123.8 kg)   Body mass index is 44.06 kg/m.     12/14/2022   10:43 AM 06/19/2022    5:56 AM 11/11/2021   11:38 AM 09/25/2021    7:21 AM 09/21/2021    5:40 PM 09/15/2021    7:00 AM 09/04/2021   12:00 PM  Advanced Directives  Does Patient Have a Medical Advance Directive? Yes Yes Yes No No No No  Type of Estate agent of [place];Living will Healthcare Power of Attorney       Does patient want to make changes to medical advance directive?  No - Patient declined Yes (MAU/Ambulatory/Procedural Areas - Information given)      Copy of Healthcare Power of Attorney in Chart? No - copy requested No - copy requested       Would patient like information on creating a medical advance directive?     No - Patient declined  Yes (Inpatient - patient defers creating a medical advance directive at this time - Information given)    Current Medications (verified) Outpatient Encounter Medications as of 12/14/2022  Medication Sig   baclofen (LIORESAL) 20 MG tablet Take 1 tablet (20 mg total) by mouth 2 (two) times daily.   busPIRone (BUSPAR) 10 MG tablet TAKE 1 TABLET(10 MG) BY MOUTH TWICE DAILY    carvedilol (COREG) 3.125 MG tablet TAKE 1 TABLET(3.125 MG) BY MOUTH TWICE DAILY WITH A MEAL   furosemide (LASIX) 20 MG tablet TAKE 1 TABLET(20 MG) BY MOUTH DAILY   lactulose (CHRONULAC) 10 GM/15ML solution Take 30 mLs by mouth 3 (three) times daily. Takes 2 to 3 times daily.   nortriptyline (PAMELOR) 50 MG capsule Take 2 capsules (100 mg total) by mouth at bedtime.   pantoprazole (PROTONIX) 40 MG tablet TAKE 1 TABLET(40 MG) BY MOUTH DAILY   rifaximin (XIFAXAN) 550 MG TABS tablet Take 550 mg by mouth in the morning and at bedtime.   spironolactone (ALDACTONE) 25 MG tablet TAKE 1 TABLET(25 MG) BY MOUTH DAILY   EPINEPHrine 0.3 mg/0.3 mL IJ SOAJ injection Inject into the muscle. (Patient not taking: Reported on 12/14/2022)   [DISCONTINUED] nortriptyline (PAMELOR) 50 MG capsule Take by mouth.   [DISCONTINUED] nortriptyline (PAMELOR) 50 MG capsule Take 100 mg by mouth at bedtime.   No facility-administered encounter medications on file as of 12/14/2022.    Allergies (verified) Shellfish allergy, Amitriptyline, Iodine, and Shellfish-derived products   History: Past Medical History:  Diagnosis Date   Allergy to environmental factors    Anxiety    Follows w/ PCP Dr. Rondel Jumbo @ [facility].   Arthritis    ankles   Bilateral lower extremity edema 2023   Chronic pain  LLE reflex sympathetic dystrophy, follws with Pain Management, Yehuda Budd, PA.   COVID-19 03/08/2021   Depression    Follows w/ PCP.   Esophageal varices (HCC)    Follows w/ Annamarie Major, NP @ [facility].   Gallstones    GERD (gastroesophageal reflux disease)    taking Protonix   Headache    stopped after menopause   Hemolytic anemia (HCC)    Follows w/ Dr. Burnice Logan Iruku @ [facility], see 06/15/22 OV in Epic, s/p iron infusions   Hepatic encephalopathy (HCC) 10/11/2021   treated w/ lactulose and rifaximim   History of pneumonia 08/2021   Hospital admission on 09/22/21.   Liver cirrhosis  secondary to NASH [facility])    Follows w/ Dr. Leighton Parody @ [facility] and Annamarie Major, NP @ [facility].   Neuromuscular disorder (HCC)    reflex sympathetic dystrophy of LLE, follows w/ pain managemnt. Patient stated she broke her ankle again and nerve pain resolved.   PMB (postmenopausal bleeding) 2023   Thrombocytopenia (HCC)    Follows w/ Dr. Burnice Logan Iruku @ [facility].   Urticaria    Wears glasses    Past Surgical History:  Procedure Laterality Date   APPENDECTOMY  1979   BIOPSY  09/25/2021   Procedure: BIOPSY;  Surgeon: Benancio Deeds, MD;  Location: [facility] ENDOSCOPY;  Service: Gastroenterology;;   BREAST SURGERY  1980   breast reduction   CHOLECYSTECTOMY     as a young adult   COLONOSCOPY WITH PROPOFOL N/A 09/25/2021   Procedure: COLONOSCOPY WITH PROPOFOL;  Surgeon: Benancio Deeds, MD;  Location: [facility] ENDOSCOPY;  Service: Gastroenterology;  Laterality: N/A;   DILATATION & CURETTAGE/HYSTEROSCOPY WITH MYOSURE N/A 06/19/2022   Procedure: DILATATION & CURETTAGE/HYSTEROSCOPY WITH MYOSURE LITE;  Surgeon: [name], MD;  Location: [facility];  Service: Gynecology;  Laterality: N/A;   ESOPHAGOGASTRODUODENOSCOPY N/A 09/25/2021   Procedure: ESOPHAGOGASTRODUODENOSCOPY (EGD);  Surgeon: Benancio Deeds, MD;  Location: [facility] ENDOSCOPY;  Service: Gastroenterology;  Laterality: N/A;   NASAL SEPTUM SURGERY     when patient was in her 70's   ORIF ANKLE FRACTURE Left 03/26/2021   Procedure: OPEN REDUCTION INTERNAL FIXATION (ORIF) ANKLE FRACTURE;  Surgeon: Netta Cedars, MD;  Location: [facility] OR;  Service: Orthopedics;  Laterality: Left;   POLYPECTOMY  09/25/2021   Procedure: POLYPECTOMY;  Surgeon: Benancio Deeds, MD;  Location: [facility] ENDOSCOPY;  Service: Gastroenterology;;   Family History  Problem Relation Age of Onset   Intellectual disability Mother    Hypertension Mother    Hyperlipidemia Mother    Depression Mother     Arthritis Mother    Mental illness Mother    Heart disease Father    Diabetes Father    Stroke Father    Heart disease Brother    COPD Brother    Mental illness Daughter    Learning disabilities Daughter    Diabetes Daughter    Allergic rhinitis Neg Hx    Angioedema Neg Hx    Asthma Neg Hx    Eczema Neg Hx    Immunodeficiency Neg Hx    Urticaria Neg Hx    Colon cancer Neg Hx    Pancreatic cancer Neg Hx    Liver cancer Neg Hx    Stomach cancer Neg Hx    Social History   Socioeconomic History   Marital status: Married    Spouse name: Not on file   Number of children:  Not on file   Years of education: Not on file   Highest education level: Not on file  Occupational History   Not on file  Tobacco Use   Smoking status: Former    Current packs/day: 0.00    Average packs/day: 1 pack/day for 15.0 years (15.0 ttl pk-yrs)    Types: Cigarettes    Start date: 01/1989    Quit date: 01/2004    Years since quitting: 18.9   Smokeless tobacco: Never  Vaping Use   Vaping status: Never Used  Substance and Sexual Activity   Alcohol use: Not Currently   Drug use: No   Sexual activity: Not on file  Other Topics Concern   Not on file  Social History Narrative   Has 1 child(artificial insem), 2 adopted, and 1 step from partner's former partner   Raising 4 grandchildren.   Social Determinants of Health   Financial Resource Strain: Low Risk  (12/14/2022)   Overall Financial Resource Strain (CARDIA)    Difficulty of Paying Living Expenses: Not hard at all  Food Insecurity: No Food Insecurity (12/14/2022)   Hunger Vital Sign    Worried About Running Out of Food in the Last Year: Never true    Ran Out of Food in the Last Year: Never true  Transportation Needs: No Transportation Needs (12/14/2022)   PRAPARE - Administrator, Civil Service (Medical): No    Lack of Transportation (Non-Medical): No  Physical Activity: Sufficiently Active (12/14/2022)   Exercise Vital Sign     Days of Exercise per Week: 3 days    Minutes of Exercise per Session: 90 min  Stress: No Stress Concern Present (12/14/2022)   [employer] of Occupational Health - Occupational Stress Questionnaire    Feeling of Stress : Not at all  Social Connections: Moderately Integrated (12/14/2022)   Social Connection and Isolation Panel [NHANES]    Frequency of Communication with Friends and Family: More than three times a week    Frequency of Social Gatherings with Friends and Family: More than three times a week    Attends Religious Services: Never    Database administrator or Organizations: Yes    Attends Engineer, structural: 1 to 4 times per year    Marital Status: Married    Tobacco Counseling Counseling given: Not Answered   Clinical Intake:  Pre-visit preparation completed: Yes  Pain : No/denies pain     BMI - recorded: 44.06 Nutritional Status: BMI > 30  Obese Nutritional Risks: None Diabetes: No  How often do you need to have someone help you when you read instructions, pamphlets, or other written materials from your doctor or pharmacy?: 1 - Never  Interpreter Needed?: No  Information entered by :: Lanier Ensign, LPN   Activities of Daily Living    12/14/2022   10:37 AM 06/19/2022    6:04 AM  In your present state of health, do you have any difficulty performing the following activities:  Hearing? 0 0  Vision? 0 0  Difficulty concentrating or making decisions? 0 0  Walking or climbing stairs? 1 0  Comment ankle pain   Dressing or bathing? 0 0  Doing errands, shopping? 0   Preparing Food and eating ? N   Using the Toilet? N   In the past six months, have you accidently leaked urine? N   Do you have problems with loss of bowel control? N   Managing your Medications? N   Managing  your Finances? N   Housekeeping or managing your Housekeeping? N     Patient Care Team: Jeani Sow, MD as PCP - General (Family Medicine)  Indicate any recent  Medical Services you may have received from other than [facility] providers in the past year (date may be approximate).     Assessment:   This is a routine wellness examination for Theresa Moran.  Hearing/Vision screen Hearing Screening - Comments:: Pt denies any hearing issues  Vision Screening - Comments:: Pt follows up with Dr Shea Evans for annual eye exams   Dietary issues and exercise activities discussed:     Goals Addressed             This Visit's Progress    Patient Stated       Lose weight        Depression Screen    12/14/2022   10:41 AM 10/20/2022   10:04 AM 11/11/2021   11:37 AM 08/04/2021    8:50 AM 12/18/2019    2:16 PM 03/22/2018   10:02 AM 12/24/2015    3:58 PM  PHQ 2/9 Scores  PHQ - 2 Score 0 0 1 0 0 0 0  PHQ- 9 Score 0 2  0       Fall Risk    12/14/2022   10:44 AM 10/20/2022   10:03 AM 11/11/2021   11:39 AM 08/04/2021    8:51 AM 12/18/2019    2:16 PM  Fall Risk   Falls in the past year? 1 1 1 1  0  Number falls in past yr: 1 1 1 1  0  Injury with Fall? 1 0 1 1 0  Comment bruised  fx ankle    Risk for fall due to : Impaired vision;Impaired mobility;Impaired balance/gait Impaired balance/gait Impaired vision;Impaired balance/gait;Impaired mobility    Follow up Falls prevention discussed Falls prevention discussed Falls prevention discussed  Falls evaluation completed    MEDICARE RISK AT HOME:  Medicare Risk at Home - 12/14/22 1044     Any stairs in or around the home? Yes    If so, are there any without handrails? No    Home free of loose throw rugs in walkways, pet beds, electrical cords, etc? Yes    Adequate lighting in your home to reduce risk of falls? Yes    Life alert? No    Use of a cane, walker or w/c? No    Grab bars in the bathroom? No    Shower chair or bench in shower? No    Elevated toilet seat or a handicapped toilet? No             TIMED UP AND GO:  Was the test performed?  No    Cognitive Function:        12/14/2022   10:45 AM  11/11/2021   11:42 AM  6CIT Screen  What Year? 0 points 0 points  What month? 0 points 0 points  What time? 0 points 0 points  Count back from 20 0 points 0 points  Months in reverse 0 points 0 points  Repeat phrase 0 points 0 points  Total Score 0 points 0 points    Immunizations Immunization History  Administered Date(s) Administered   Fluad Quad(high Dose 65+) 05/08/2022   Hepatitis A, Adult 10/15/2021   Hepatitis B, ADULT 12/04/2021   Hepb-cpg 10/15/2021   Influenza Split 02/28/2013   Influenza,inj,Quad PF,6+ Mos 03/22/2018, 01/15/2020   Janssen (J&J) SARS-COV-2 Vaccination 08/29/2019   Moderna SARS-COV2 Booster  Vaccination 04/28/2020   Tdap 03/22/2018    TDAP status: Up to date  Flu Vaccine status: Up to date  Pneumococcal vaccine status: Up to date  Covid-19 vaccine status: Completed vaccines  Qualifies for Shingles Vaccine? Yes   Zostavax completed No   Shingrix Completed?: No.    Education has been provided regarding the importance of this vaccine. Patient has been advised to call insurance company to determine out of pocket expense if they have not yet received this vaccine. Advised may also receive vaccine at local pharmacy or Health Dept. Verbalized acceptance and understanding.  Screening Tests Health Maintenance  Topic Date Due   COVID-19 Vaccine (2 - Janssen risk series) 01/06/2023 (Originally 05/26/2020)   Zoster Vaccines- Shingrix (1 of 2) 01/06/2023 (Originally 08/27/1979)   INFLUENZA VACCINE  12/24/2022   Medicare Annual Wellness (AWV)  12/14/2023   MAMMOGRAM  07/03/2024   PAP SMEAR-Modifier  04/02/2025   DTaP/Tdap/Td (2 - Td or Tdap) 03/22/2028   Colonoscopy  09/26/2031   Hepatitis C Screening  Completed   HIV Screening  Completed   HPV VACCINES  Aged Out    Health Maintenance  There are no preventive care reminders to display for this patient.   Colorectal cancer screening: Type of screening: Colonoscopy. Completed 09/25/21. Repeat every 10  years  Mammogram status: Completed 07/03/22. Repeat every year    Additional Screening:  Hepatitis C Screening:  Completed 10/20/22  Vision Screening: Recommended annual ophthalmology exams for early detection of glaucoma and other disorders of the eye. Is the patient up to date with their annual eye exam?  Yes  Who is the provider or what is the name of the office in which the patient attends annual eye exams? Dr Shea Evans If pt is not established with a provider, would they like to be referred to a provider to establish care? No .   Dental Screening: Recommended annual dental exams for proper oral hygiene    Community Resource Referral / Chronic Care Management: CRR required this visit?  No   CCM required this visit?  No     Plan:     I have personally reviewed and noted the following in the patient's chart:   Medical and social history Use of alcohol, tobacco or illicit drugs  Current medications and supplements including opioid prescriptions. Patient is not currently taking opioid prescriptions. Functional ability and status Nutritional status Physical activity Advanced directives List of other physicians Hospitalizations, surgeries, and ER visits in previous 12 months Vitals Screenings to include cognitive, depression, and falls Referrals and appointments  In addition, I have reviewed and discussed with patient certain preventive protocols, quality metrics, and best practice recommendations. A written personalized care plan for preventive services as well as general preventive health recommendations were provided to patient.     Marzella Schlein, LPN   08/31/8117   After Visit Summary: (MyChart) Due to this being a telephonic visit, the after visit summary with patients personalized plan was offered to patient via MyChart   Nurse Notes: none

## 2022-12-14 NOTE — Patient Instructions (Signed)
Theresa Moran , Thank you for taking time to come for your Medicare Wellness Visit. I appreciate your ongoing commitment to your health goals. Please review the following plan we discussed and let me know if I can assist you in the future.   These are the goals we discussed:  Goals      Patient Stated     Lose weight      Patient Stated     Lose weight         This is a list of the screening recommended for you and due dates:  Health Maintenance  Topic Date Due   COVID-19 Vaccine (2 - Janssen risk series) 01/06/2023*   Zoster (Shingles) Vaccine (1 of 2) 01/06/2023*   Flu Shot  12/24/2022   Medicare Annual Wellness Visit  12/14/2023   Mammogram  07/03/2024   Pap Smear  04/02/2025   DTaP/Tdap/Td vaccine (2 - Td or Tdap) 03/22/2028   Colon Cancer Screening  09/26/2031   Hepatitis C Screening  Completed   HIV Screening  Completed   HPV Vaccine  Aged Out  *Topic was postponed. The date shown is not the original due date.    Advanced directives: Please bring a copy of your health care power of attorney and living will to the office at your convenience.  Conditions/risks identified: lose weight   Next appointment: Follow up in one year for your annual wellness visit.   Preventive Care 40-64 Years, Female Preventive care refers to lifestyle choices and visits with your health care provider that can promote health and wellness. What does preventive care include? A yearly physical exam. This is also called an annual well check. Dental exams once or twice a year. Routine eye exams. Ask your health care provider how often you should have your eyes checked. Personal lifestyle choices, including: Daily care of your teeth and gums. Regular physical activity. Eating a healthy diet. Avoiding tobacco and drug use. Limiting alcohol use. Practicing safe sex. Taking low-dose aspirin daily starting at age 36. Taking vitamin and mineral supplements as recommended by your health care  provider. What happens during an annual well check? The services and screenings done by your health care provider during your annual well check will depend on your age, overall health, lifestyle risk factors, and family history of disease. Counseling  Your health care provider may ask you questions about your: Alcohol use. Tobacco use. Drug use. Emotional well-being. Home and relationship well-being. Sexual activity. Eating habits. Work and work Astronomer. Method of birth control. Menstrual cycle. Pregnancy history. Screening  You may have the following tests or measurements: Height, weight, and BMI. Blood pressure. Lipid and cholesterol levels. These may be checked every 5 years, or more frequently if you are over 52 years old. Skin check. Lung cancer screening. You may have this screening every year starting at age 41 if you have a 30-pack-year history of smoking and currently smoke or have quit within the past 15 years. Fecal occult blood test (FOBT) of the stool. You may have this test every year starting at age 84. Flexible sigmoidoscopy or colonoscopy. You may have a sigmoidoscopy every 5 years or a colonoscopy every 10 years starting at age 43. Hepatitis C blood test. Hepatitis B blood test. Sexually transmitted disease (STD) testing. Diabetes screening. This is done by checking your blood sugar (glucose) after you have not eaten for a while (fasting). You may have this done every 1-3 years. Mammogram. This may be done every 1-2 years.  Talk to your health care provider about when you should start having regular mammograms. This may depend on whether you have a family history of breast cancer. BRCA-related cancer screening. This may be done if you have a family history of breast, ovarian, tubal, or peritoneal cancers. Pelvic exam and Pap test. This may be done every 3 years starting at age 28. Starting at age 47, this may be done every 5 years if you have a Pap test in  combination with an HPV test. Bone density scan. This is done to screen for osteoporosis. You may have this scan if you are at high risk for osteoporosis. Discuss your test results, treatment options, and if necessary, the need for more tests with your health care provider. Vaccines  Your health care provider may recommend certain vaccines, such as: Influenza vaccine. This is recommended every year. Tetanus, diphtheria, and acellular pertussis (Tdap, Td) vaccine. You may need a Td booster every 10 years. Zoster vaccine. You may need this after age 80. Pneumococcal 13-valent conjugate (PCV13) vaccine. You may need this if you have certain conditions and were not previously vaccinated. Pneumococcal polysaccharide (PPSV23) vaccine. You may need one or two doses if you smoke cigarettes or if you have certain conditions. Talk to your health care provider about which screenings and vaccines you need and how often you need them. This information is not intended to replace advice given to you by your health care provider. Make sure you discuss any questions you have with your health care provider. Document Released: 06/07/2015 Document Revised: 01/29/2016 Document Reviewed: 03/12/2015 Elsevier Interactive Patient Education  2017 [employer].    Fall Prevention in the Home Falls can cause injuries. They can happen to people of all ages. There are many things you can do to make your home safe and to help prevent falls. What can I do on the outside of my home? Regularly fix the edges of walkways and driveways and fix any cracks. Remove anything that might make you trip as you walk through a door, such as a raised step or threshold. Trim any bushes or trees on the path to your home. Use bright outdoor lighting. Clear any walking paths of anything that might make someone trip, such as rocks or tools. Regularly check to see if handrails are loose or broken. Make sure that both sides of any steps have  handrails. Any raised decks and porches should have guardrails on the edges. Have any leaves, snow, or ice cleared regularly. Use sand or salt on walking paths during winter. Clean up any spills in your garage right away. This includes oil or grease spills. What can I do in the bathroom? Use night lights. Install grab bars by the toilet and in the tub and shower. Do not use towel bars as grab bars. Use non-skid mats or decals in the tub or shower. If you need to sit down in the shower, use a plastic, non-slip stool. Keep the floor dry. Clean up any water that spills on the floor as soon as it happens. Remove soap buildup in the tub or shower regularly. Attach bath mats securely with double-sided non-slip rug tape. Do not have throw rugs and other things on the floor that can make you trip. What can I do in the bedroom? Use night lights. Make sure that you have a light by your bed that is easy to reach. Do not use any sheets or blankets that are too big for your bed. They should  not hang down onto the floor. Have a firm chair that has side arms. You can use this for support while you get dressed. Do not have throw rugs and other things on the floor that can make you trip. What can I do in the kitchen? Clean up any spills right away. Avoid walking on wet floors. Keep items that you use a lot in easy-to-reach places. If you need to reach something above you, use a strong step stool that has a grab bar. Keep electrical cords out of the way. Do not use floor polish or wax that makes floors slippery. If you must use wax, use non-skid floor wax. Do not have throw rugs and other things on the floor that can make you trip. What can I do with my stairs? Do not leave any items on the stairs. Make sure that there are handrails on both sides of the stairs and use them. Fix handrails that are broken or loose. Make sure that handrails are as long as the stairways. Check any carpeting to make sure  that it is firmly attached to the stairs. Fix any carpet that is loose or worn. Avoid having throw rugs at the top or bottom of the stairs. If you do have throw rugs, attach them to the floor with carpet tape. Make sure that you have a light switch at the top of the stairs and the bottom of the stairs. If you do not have them, ask someone to add them for you. What else can I do to help prevent falls? Wear shoes that: Do not have high heels. Have rubber bottoms. Are comfortable and fit you well. Are closed at the toe. Do not wear sandals. If you use a stepladder: Make sure that it is fully opened. Do not climb a closed stepladder. Make sure that both sides of the stepladder are locked into place. Ask someone to hold it for you, if possible. Clearly mark and make sure that you can see: Any grab bars or handrails. First and last steps. Where the edge of each step is. Use tools that help you move around (mobility aids) if they are needed. These include: Canes. Walkers. Scooters. Crutches. Turn on the lights when you go into a dark area. Replace any light bulbs as soon as they burn out. Set up your furniture so you have a clear path. Avoid moving your furniture around. If any of your floors are uneven, fix them. If there are any pets around you, be aware of where they are. Review your medicines with your doctor. Some medicines can make you feel dizzy. This can increase your chance of falling. Ask your doctor what other things that you can do to help prevent falls. This information is not intended to replace advice given to you by your health care provider. Make sure you discuss any questions you have with your health care provider. Document Released: 03/07/2009 Document Revised: 10/17/2015 Document Reviewed: 06/15/2014 Elsevier Interactive Patient Education  2017 [employer].

## 2022-12-15 ENCOUNTER — Other Ambulatory Visit (INDEPENDENT_AMBULATORY_CARE_PROVIDER_SITE_OTHER): Payer: Medicare (Managed Care)

## 2022-12-15 DIAGNOSIS — D649 Anemia, unspecified: Secondary | ICD-10-CM

## 2022-12-15 LAB — CBC WITH DIFFERENTIAL/PLATELET
Basophils Absolute: 0 10*3/uL (ref 0.0–0.1)
Basophils Relative: 0.7 % (ref 0.0–3.0)
Eosinophils Absolute: 0.2 10*3/uL (ref 0.0–0.7)
Eosinophils Relative: 2.9 % (ref 0.0–5.0)
HCT: 40.9 % (ref 36.0–46.0)
Hemoglobin: 13.6 g/dL (ref 12.0–15.0)
Lymphocytes Relative: 30.5 % (ref 12.0–46.0)
Lymphs Abs: 2 10*3/uL (ref 0.7–4.0)
MCHC: 33.2 g/dL (ref 30.0–36.0)
MCV: 95.7 fl (ref 78.0–100.0)
Monocytes Absolute: 0.4 10*3/uL (ref 0.1–1.0)
Monocytes Relative: 5.6 % (ref 3.0–12.0)
Neutro Abs: 4 10*3/uL (ref 1.4–7.7)
Neutrophils Relative %: 60.3 % (ref 43.0–77.0)
Platelets: 131 10*3/uL — ABNORMAL LOW (ref 150.0–400.0)
RBC: 4.27 Mil/uL (ref 3.87–5.11)
RDW: 15 % (ref 11.5–15.5)
WBC: 6.6 10*3/uL (ref 4.0–10.5)

## 2022-12-15 NOTE — Progress Notes (Signed)
stable °

## 2023-02-24 ENCOUNTER — Emergency Department (HOSPITAL_BASED_OUTPATIENT_CLINIC_OR_DEPARTMENT_OTHER): Payer: Medicare (Managed Care)

## 2023-02-24 ENCOUNTER — Other Ambulatory Visit: Payer: Self-pay

## 2023-02-24 ENCOUNTER — Telehealth: Payer: Self-pay | Admitting: Family Medicine

## 2023-02-24 ENCOUNTER — Encounter: Payer: Self-pay | Admitting: Family Medicine

## 2023-02-24 ENCOUNTER — Emergency Department (HOSPITAL_BASED_OUTPATIENT_CLINIC_OR_DEPARTMENT_OTHER)
Admission: EM | Admit: 2023-02-24 | Discharge: 2023-02-24 | Disposition: A | Discharge status: Home / Self Care | Payer: Medicare (Managed Care) | Attending: Emergency Medicine | Admitting: Emergency Medicine

## 2023-02-24 ENCOUNTER — Encounter (HOSPITAL_BASED_OUTPATIENT_CLINIC_OR_DEPARTMENT_OTHER): Payer: Self-pay | Admitting: Emergency Medicine

## 2023-02-24 DIAGNOSIS — M25561 Pain in right knee: Secondary | ICD-10-CM | POA: Diagnosis not present

## 2023-02-24 DIAGNOSIS — Z8616 Personal history of COVID-19: Secondary | ICD-10-CM | POA: Diagnosis not present

## 2023-02-24 DIAGNOSIS — M25552 Pain in left hip: Secondary | ICD-10-CM | POA: Diagnosis not present

## 2023-02-24 DIAGNOSIS — W19XXXS Unspecified fall, sequela: Secondary | ICD-10-CM | POA: Insufficient documentation

## 2023-02-24 DIAGNOSIS — R42 Dizziness and giddiness: Secondary | ICD-10-CM | POA: Insufficient documentation

## 2023-02-24 DIAGNOSIS — R7989 Other specified abnormal findings of blood chemistry: Secondary | ICD-10-CM

## 2023-02-24 DIAGNOSIS — E7220 Disorder of urea cycle metabolism, unspecified: Secondary | ICD-10-CM | POA: Insufficient documentation

## 2023-02-24 DIAGNOSIS — R410 Disorientation, unspecified: Secondary | ICD-10-CM | POA: Diagnosis present

## 2023-02-24 LAB — URINALYSIS, ROUTINE W REFLEX MICROSCOPIC
Bilirubin Urine: NEGATIVE
Glucose, UA: NEGATIVE mg/dL
Hgb urine dipstick: NEGATIVE
Ketones, ur: NEGATIVE mg/dL
Leukocytes,Ua: NEGATIVE
Nitrite: NEGATIVE
Protein, ur: NEGATIVE mg/dL
Specific Gravity, Urine: 1.017 (ref 1.005–1.030)
pH: 6.5 (ref 5.0–8.0)

## 2023-02-24 LAB — COMPREHENSIVE METABOLIC PANEL
ALT: 28 U/L (ref 0–44)
AST: 54 U/L — ABNORMAL HIGH (ref 15–41)
Albumin: 3.6 g/dL (ref 3.5–5.0)
Alkaline Phosphatase: 166 U/L — ABNORMAL HIGH (ref 38–126)
Anion gap: 6 (ref 5–15)
BUN: 17 mg/dL (ref 8–23)
CO2: 27 mmol/L (ref 22–32)
Calcium: 9.3 mg/dL (ref 8.9–10.3)
Chloride: 104 mmol/L (ref 98–111)
Creatinine, Ser: 0.78 mg/dL (ref 0.44–1.00)
GFR, Estimated: >60 mL/min (ref >60)
Glucose, Bld: 90 mg/dL (ref 70–99)
Potassium: 3.9 mmol/L (ref 3.5–5.1)
Sodium: 137 mmol/L (ref 135–145)
Total Bilirubin: 1.5 mg/dL — ABNORMAL HIGH (ref 0.3–1.2)
Total Protein: 7.1 g/dL (ref 6.5–8.1)

## 2023-02-24 LAB — CBC WITH DIFFERENTIAL/PLATELET
Abs Immature Granulocytes: 0.02 10*3/uL (ref 0.00–0.07)
Basophils Absolute: 0.1 10*3/uL (ref 0.0–0.1)
Basophils Relative: 1 %
Eosinophils Absolute: 0.3 10*3/uL (ref 0.0–0.5)
Eosinophils Relative: 4 %
HCT: 41.6 % (ref 36.0–46.0)
Hemoglobin: 14.4 g/dL (ref 12.0–15.0)
Immature Granulocytes: 0 %
Lymphocytes Relative: 28 %
Lymphs Abs: 2.2 10*3/uL (ref 0.7–4.0)
MCH: 32.5 pg (ref 26.0–34.0)
MCHC: 34.6 g/dL (ref 30.0–36.0)
MCV: 93.9 fL (ref 80.0–100.0)
Monocytes Absolute: 0.8 10*3/uL (ref 0.1–1.0)
Monocytes Relative: 10 %
Neutro Abs: 4.4 10*3/uL (ref 1.7–7.7)
Neutrophils Relative %: 57 %
Platelets: 92 10*3/uL — ABNORMAL LOW (ref 150–400)
RBC: 4.43 MIL/uL (ref 3.87–5.11)
RDW: 14.8 % (ref 11.5–15.5)
WBC: 7.8 10*3/uL (ref 4.0–10.5)
nRBC: 0 % (ref 0.0–0.2)

## 2023-02-24 LAB — D-DIMER, QUANTITATIVE: D-Dimer, Quant: 1.35 ug{FEU}/mL — ABNORMAL HIGH (ref 0.00–0.50)

## 2023-02-24 LAB — AMMONIA: Ammonia: 87 umol/L — ABNORMAL HIGH (ref 9–35)

## 2023-02-24 LAB — CBG MONITORING, ED: Glucose-Capillary: 86 mg/dL (ref 70–99)

## 2023-02-24 MED ORDER — IOHEXOL 350 MG/ML SOLN
100.0000 mL | Freq: Once | INTRAVENOUS | Status: AC | PRN
  Administered 2023-02-24: 100 mL via INTRAVENOUS

## 2023-02-24 MED ORDER — CARVEDILOL 6.25 MG PO TABS
3.1250 mg | ORAL_TABLET | Freq: Two times a day (BID) | ORAL | Status: DC
  Filled 2023-02-24: qty 1

## 2023-02-24 MED ORDER — SODIUM CHLORIDE 0.9 % IV BOLUS
1000.0000 mL | Freq: Once | INTRAVENOUS | Status: AC
  Administered 2023-02-24: 1000 mL via INTRAVENOUS

## 2023-02-24 MED ORDER — LACTULOSE 10 GM/15ML PO SOLN
20.0000 g | Freq: Once | ORAL | Status: AC
  Administered 2023-02-24: 20 g via ORAL
  Filled 2023-02-24: qty 30

## 2023-02-24 MED ORDER — CARVEDILOL 6.25 MG PO TABS
3.1250 mg | ORAL_TABLET | Freq: Once | ORAL | Status: AC
  Administered 2023-02-24: 3.125 mg via ORAL

## 2023-02-24 NOTE — Telephone Encounter (Signed)
FYI: This call has been transferred to triage nurse: Access Nurse. Once the result note has been entered staff can address the message at that time.  Patient called in with the following symptoms:  Red Word: Dizziness, drooling when sleeping (not baseline), losing words, one fall each week x 3 weeks; Denies head injury. States sx are similar to those when she had liver decompensation.   Please advise at [facility] [phone]  Message is routed to Provider Pool.

## 2023-02-24 NOTE — ED Notes (Signed)
Pt reports her sx are similar to when she was diagnosed with cirrhosis.

## 2023-02-24 NOTE — ED Provider Notes (Signed)
[place] EMERGENCY DEPARTMENT AT [facility] Provider Note   CSN: [id number] Arrival date & time: 02/24/23  1610     History  Chief Complaint  Patient presents with   Theresa Moran is a 62 y.o. female history cirrhosis, anemia presents today for evaluation of fall.  Patient reports 2 falls in the last 2 days.  The first time was a mechanical fall when her toes got caught on the floor but the second time she does not know why she fell.  States she fell to her right side and has had right knee pain and hip pain since.  States she fell lightheadedness before the fall.  She denies any chest pain, shortness of breath, fever, nausea, vomiting, abdominal pain, bowel changes, urinary symptoms, rash.  Denies any recent medication changes.   Fall    Past Medical History:  Diagnosis Date   Allergy to environmental factors    Anxiety    Follows w/ PCP Dr. Rondel Jumbo @ [facility].   Arthritis    ankles   Bilateral lower extremity edema 2023   Chronic pain    LLE reflex sympathetic dystrophy, follws with Pain Management, Yehuda Budd, PA.   COVID-19 03/08/2021   Depression    Follows w/ PCP.   Esophageal varices (HCC)    Follows w/ Annamarie Major, NP @ [facility].   Gallstones    GERD (gastroesophageal reflux disease)    taking Protonix   Headache    stopped after menopause   Hemolytic anemia (HCC)    Follows w/ Dr. Burnice Logan Iruku @ [facility], see 06/15/22 OV in Epic, s/p iron infusions   Hepatic encephalopathy (HCC) 10/11/2021   treated w/ lactulose and rifaximim   History of pneumonia 08/2021   Hospital admission on 09/22/21.   Liver cirrhosis secondary to NASH [facility])    Follows w/ Dr. Leighton Parody @ [facility] and Annamarie Major, NP @ [facility].   Neuromuscular disorder (HCC)    reflex sympathetic dystrophy of LLE, follows w/ pain managemnt. Patient stated she broke her ankle again and  nerve pain resolved.   PMB (postmenopausal bleeding) 2023   Thrombocytopenia (HCC)    Follows w/ Dr. Burnice Logan Iruku @ [facility].   Urticaria    Wears glasses    Past Medical History:  Diagnosis Date   Allergy to environmental factors    Anxiety    Follows w/ PCP Dr. Rondel Jumbo @ [facility].   Arthritis    ankles   Bilateral lower extremity edema 2023   Chronic pain    LLE reflex sympathetic dystrophy, follws with Pain Management, Yehuda Budd, PA.   COVID-19 03/08/2021   Depression    Follows w/ PCP.   Esophageal varices (HCC)    Follows w/ Annamarie Major, NP @ [facility].   Gallstones    GERD (gastroesophageal reflux disease)    taking Protonix   Headache    stopped after menopause   Hemolytic anemia (HCC)    Follows w/ Dr. Burnice Logan Iruku @ [facility], see 06/15/22 OV in Epic, s/p iron infusions   Hepatic encephalopathy (HCC) 10/11/2021   treated w/ lactulose and rifaximim   History of pneumonia 08/2021   Hospital admission on 09/22/21.   Liver cirrhosis secondary to NASH [facility])    Follows w/ Dr. Leighton Parody @ [facility] and Annamarie Major, NP @ [facility].   Neuromuscular disorder (HCC)    reflex sympathetic dystrophy of LLE, follows w/ pain managemnt. Patient stated she broke her ankle again and nerve pain resolved.   PMB (postmenopausal bleeding) 2023   Thrombocytopenia (HCC)    Follows w/ Dr. Burnice Logan Iruku @ [facility].   Urticaria    Wears glasses      Home Medications Prior to Admission medications   Medication Sig Start Date End Date Taking? Authorizing Provider  baclofen (LIORESAL) 20 MG tablet Take 1 tablet (20 mg total) by mouth 2 (two) times daily. 10/20/22   Jeani Sow, MD  busPIRone (BUSPAR) 10 MG tablet TAKE 1 TABLET(10 MG) BY MOUTH TWICE DAILY 09/14/22   Jeani Sow, MD  carvedilol (COREG) 3.125 MG tablet TAKE 1 TABLET(3.125 MG) BY MOUTH TWICE DAILY WITH A MEAL 10/04/22   Jeani Sow, MD  EPINEPHrine 0.3 mg/0.3 mL IJ SOAJ injection Inject into the muscle. Patient not taking: Reported on 12/14/2022    [provider]  furosemide (LASIX) 20 MG tablet TAKE 1 TABLET(20 MG) BY MOUTH DAILY 10/04/22   Jeani Sow, MD  lactulose [facility]) 10 GM/15ML solution Take 30 mLs by mouth 3 (three) times daily. Takes 2 to 3 times daily.    [provider]  nortriptyline (PAMELOR) 50 MG capsule Take 2 capsules (100 mg total) by mouth at bedtime. 10/20/22   Jeani Sow, MD  pantoprazole (PROTONIX) 40 MG tablet TAKE 1 TABLET(40 MG) BY MOUTH DAILY 10/20/22   Jeani Sow, MD  rifaximin (XIFAXAN) 550 MG TABS tablet Take 550 mg by mouth in the morning and at bedtime.    [provider]  spironolactone (ALDACTONE) 25 MG tablet TAKE 1 TABLET(25 MG) BY MOUTH DAILY 10/04/22   Jeani Sow, MD      Allergies    Shellfish allergy, Amitriptyline, Iodine, and Shellfish-derived products    Review of Systems   Review of Systems Negative except as per HPI.  Physical Exam Updated Vital Signs BP 134/68 (BP Location: Right Arm)   Pulse (!) 117   Temp 98.3 F (36.8 C)   Resp 20   Wt 127 kg   SpO2 96%   BMI 45.19 kg/m  Physical Exam Vitals and nursing note reviewed.  Constitutional:      Appearance: Normal appearance.  HENT:     Head: Normocephalic and atraumatic.     Mouth/Throat:     Mouth: Mucous membranes are moist.  Eyes:     General: No scleral icterus. Cardiovascular:     Rate and Rhythm: Normal rate and regular rhythm.     Pulses: Normal pulses.     Heart sounds: Normal heart sounds.  Pulmonary:     Effort: Pulmonary effort is normal.     Breath sounds: Normal breath sounds.  Abdominal:     General: Abdomen is flat.     Palpations: Abdomen is soft.     Tenderness: There is no abdominal tenderness.  Musculoskeletal:        General: No deformity.  Skin:    General: Skin is warm.     Findings: No rash.  Neurological:      General: No focal deficit present.     Mental Status: She is alert.     Comments: Cranial nerves II through XII intact. Intact sensation to light touch in all 4 extremities. 5/5 strength in all 4 extremities. Intact finger-to-nose and heel-to-shin of all 4 extremities. No visual field cuts. No neglect  noted. No aphasia noted.  Psychiatric:        Mood and Affect: Mood normal.     ED Results / Procedures / Treatments   Labs (all labs ordered are listed, but only abnormal results are displayed) Labs Reviewed - No data to display  EKG None  Radiology No results found.  Procedures Procedures    Medications Ordered in ED Medications - No data to display  ED Course/ Medical Decision Making/ A&P                                 Medical Decision Making Amount and/or Complexity of Data Reviewed Labs: ordered. Radiology: ordered.  Risk Prescription drug management.   This patient presents to the ED for falls, dizziness, confusion, this involves an extensive number of treatment options, and is a complaint that carries with a high risk of complications and morbidity.  The differential diagnosis includes alcohol intoxication, alcohol withdrawal, electrolyte abnormality, encephalopathy, hypoglycemia, hypoglycemia, DKA/HHS, opiates, uremia, trauma, toxins, tumor, thyroid toxicosis, infection, polypharmacy, psychiatric, seizure, stroke.  This is not an exhaustive list.  Lab tests: I ordered and personally interpreted labs.  The pertinent results include: WBC unremarkable. Hbg unremarkable. Platelets unremarkable. Electrolytes unremarkable. BUN, creatinine unremarkable. Ammonia level  Imaging studies: I ordered imaging studies, personally reviewed, interpreted imaging and agree with the radiologist's interpretations. The results include: CT angio chest showed no evidence of PE.  CT head is unremarkable.  X-ray of right knee is negative.  Problem list/ ED course/ Critical interventions/  Medical management: HPI: See above Vital signs within normal range and stable throughout visit. Laboratory/imaging studies significant for: See above. On physical examination, patient is afebrile and appears in no acute distress.  There was no focal neurological deficit on my examination.  Patient is able to ambulate with steady gait, no assistance.  D-dimer elevated at 1.35 however CT angio chest showed no evidence of PE.  Patient does have a history of cirrhosis and ammonia level is elevated today, likely the cause of patient's confusion.  States she has been taking lactulose as prescribed.  Patient is ANO x 4.  CT head is unremarkable.  Unlikely CVA/ICH.  She denies any headache, dizziness at this point, is able to ambulate with steady gait and no assistance. I have shared decision making with patient, she would like to be discharged home with PCP outpatient follow-up.  Strict ED return precaution discussed. I have reviewed the patient home medicines and have made adjustments as needed.  Cardiac monitoring/EKG: The patient was maintained on a cardiac monitor.  I personally reviewed and interpreted the cardiac monitor which showed an underlying rhythm of: sinus rhythm.  Additional history obtained: External records from outside source obtained and reviewed including: Chart review including previous notes, labs, imaging.  Consultations obtained:  Disposition Continued outpatient therapy. Follow-up with PCP recommended for reevaluation of symptoms. Treatment plan discussed with patient.  Pt acknowledged understanding was agreeable to the plan. Worrisome signs and symptoms were discussed with patient, and patient acknowledged understanding to return to the ED if they noticed these signs and symptoms. Patient was stable upon discharge.   This chart was dictated using voice recognition software.  Despite best efforts to proofread,  errors can occur which can change the documentation meaning.           Final Clinical Impression(s) / ED Diagnoses Final diagnoses:  Fall, sequela  Increased ammonia level  Rx / DC Orders ED Discharge Orders     None         Jeanelle Malling, [place] 02/26/23 1034    Rozelle Logan, DO 03/01/23 1505

## 2023-02-24 NOTE — ED Triage Notes (Signed)
Pt has fallen several times over past month. Pt states these are not mechanical. Not sure why she falls, does not feel dizzy before hand. Depth perception is off, when going down stairs, word finding problems as well,pt states she drools at night (this was dx as a liver issue some years ago). Pt bleeds easily from any cuts that is new for her. Pt having tremors in hands.

## 2023-02-24 NOTE — Telephone Encounter (Signed)
Patient notified of message below.

## 2023-02-24 NOTE — Discharge Instructions (Addendum)
Your workup today is significant for elevated ammonia. I recommend close follow-up with neurology and PCP for reevaluation.  Please do not hesitate to return to emergency department if worrisome signs symptoms we discussed become apparent.

## 2023-02-24 NOTE — ED Notes (Signed)
Pt HR increased into 120s upon standing. No SHOB reported. Pt ambulatory to bathroom to collect urine sample. Pt placed on monitor.

## 2023-02-24 NOTE — Telephone Encounter (Signed)
Final outcome: Go to ED Now.   Patient Name First: Theresa Last: Moran Gender: Female DOB: Jan 26, 1961 Age: 62 Y 5 M 27 D Return Phone Number: [phone] (Primary) Address: City/ State/ Zip: [place]  [address] Client [facility] - Administrator, sports at [facility] Provider Ruthine Dose, Ann Contact Type Call Who Is Calling Patient / Member / Family / Caregiver Call Type Triage / Clinical Relationship To Patient Self Return Phone Number [phone] (Primary) Chief Complaint Dizziness Reason for Call Symptomatic / Request for Health Information Initial Comment Caller states she has a history of liver decompensation, concerned her symptoms may be related. She has Dizziness, drooling when she sleeps, falls once a week for the past 3 weeks. Denied head injury. Translation No Nurse Assessment Nurse: Freida Busman, RN, Jonette Eva Date/Time (Eastern Time): 02/24/2023 10:29:46 AM Confirm and document reason for call. If symptomatic, describe symptoms. ---Caller states she has fallen once a week for the past three weeks denies any injuries from the falls , dizziness having trouble finding her words, all the s/s are getting worse and have all been going on for at least three weeks does not take blood thinnners but scratches bleed more than usual has hx of liver decompensation last md visit was three or four months ago seems to be having depth perception causing increased dizziness eating and drinking ok no bm or urinary issues no fever Does the patient have any new or worsening symptoms? ---Yes Will a triage be completed? ---Yes Related visit to physician within the last 2 weeks? ---No Does the PT have any chronic conditions? (i.e. diabetes, asthma, this includes High risk factors for pregnancy, etc.) ---Yes List chronic conditions. ---constipation, anxiety, htn, chf, spasms, liver disease Is this a behavioral health or substance  abuse call? ---No Guidelines Guideline Title Affirmed Question Affirmed Notes Nurse Date/Time (Eastern Time) Dizziness - Lightheadedness SEVERE dizziness (e.g., unable to stand, requires support to walk, feels like passing out now) Freida Busman, Charity fundraiser, [facility] 02/24/2023 10:34:55 AM Disp. Time Lamount Cohen Time) Disposition Final User 02/24/2023 10:39:46 AM Go to ED Now (or PCP triage) Yes Freida Busman, RN, Jonette Eva Final Disposition 02/24/2023 10:39:46 AM Go to ED Now (or PCP triage) Yes Freida Busman, RN, Jonette Eva Caller Disagree/Comply Comply Caller Understands Yes PreDisposition Call Doctor Care Advice Given Per Guideline GO TO ED/UCC NOW (OR PCP TRIAGE): * IF NO PCP (PRIMARY CARE PROVIDER) SECOND-LEVEL TRIAGE: You need to be seen within the next hour. Go to the ED/UCC at _____________ Hospital. Leave as soon as you can. * ED: Patients who may need surgery, need hospitalization, sound seriously ill or may be unstable need to be sent to an ED. Likewise, so do most patients with complex medical problems and serious symptoms. ANOTHER ADULT SHOULD DRIVE: * It is better and safer if another adult drives instead of you. CARE ADVICE given per Dizziness - Lightheadedness (Adult) guideline.

## 2023-02-24 NOTE — Telephone Encounter (Signed)
See message below °

## 2023-02-24 NOTE — Telephone Encounter (Signed)
Patient has already been advised to go to ED per Dr. Ruthine Dose.

## 2023-03-08 ENCOUNTER — Other Ambulatory Visit: Payer: Self-pay | Admitting: Family Medicine

## 2023-03-26 ENCOUNTER — Encounter: Payer: Self-pay | Admitting: Hematology and Oncology

## 2023-03-30 ENCOUNTER — Encounter: Payer: Self-pay | Admitting: Hematology and Oncology

## 2023-03-31 ENCOUNTER — Other Ambulatory Visit: Payer: Self-pay

## 2023-03-31 ENCOUNTER — Encounter (HOSPITAL_BASED_OUTPATIENT_CLINIC_OR_DEPARTMENT_OTHER): Payer: Self-pay | Admitting: Obstetrics and Gynecology

## 2023-03-31 NOTE — Progress Notes (Signed)
Spoke w/ via phone for pre-op interview---pt Lab needs dos---- cbc t & s        Lab results------ COVID test -----patient states asymptomatic no test needed Arrive at -------530 04-05-2023 NPO after MN NO Solid Food.  Clear liquids from MN until---430 Med rec completed Medications to take morning of surgery -----baclofen, buspirone, carvedilol, pantaprazole, rifaximin Diabetic medication -----n/a Patient instructed no nail polish to be worn day of surgery Patient instructed to bring photo id and insurance card day of surgery Patient aware to have Driver (ride ) / caregiver    for 24 hours after surgery  wife Theresa Moran-  Patient Special Instructions -----none Pre-Op special Instructions -----none Patient verbalized understanding of instructions that were given at this phone interview. Patient denies chest pain, sob, fever, cough at the interview.   Hepatology lov dawn drazek  np 09-28-2022 Ct angio chest 02-24-2023 epic Echo 09-22-2021 epic EKG 02-24-2023 epic

## 2023-04-01 ENCOUNTER — Other Ambulatory Visit: Payer: Self-pay | Admitting: Nurse Practitioner

## 2023-04-01 DIAGNOSIS — K7682 Hepatic encephalopathy: Secondary | ICD-10-CM

## 2023-04-01 DIAGNOSIS — K743 Primary biliary cirrhosis: Secondary | ICD-10-CM

## 2023-04-01 DIAGNOSIS — I8510 Secondary esophageal varices without bleeding: Secondary | ICD-10-CM

## 2023-04-01 DIAGNOSIS — K7460 Unspecified cirrhosis of liver: Secondary | ICD-10-CM

## 2023-04-02 NOTE — H&P (Signed)
Preoperative Preadmission History and Physical   Theresa Moran is an 62 y.o. female. G1P1 presenting for scheduled hysteroscopy, D&C, polypectomy. She underwent same procedure last year, pathology results with benign endometrial polyp. She started having bleeding again recently.   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 Most recent placelets were 92.   Pertinent Gynecological History: Menses: postmenopausal Bleeding: post menopausal bleeding (spotting) Contraception: none Last pap: normal Date: 2023 OB History: G1P1   Menstrual History: No LMP recorded. Patient is postmenopausal.  Menstrual History: No LMP recorded. Patient is postmenopausal.    Past Medical History:  Diagnosis Date   Allergy to environmental factors    Anxiety    Follows w/ PCP Dr. Rondel Jumbo @ [facility].   Arthritis    ankles   Bilateral lower extremity edema 2023   Chronic pain    LLE reflex sympathetic dystrophy, follws with Pain Management, Yehuda Budd, PA.   COVID-19 03/08/2021   Depression    Follows w/ PCP.   Esophageal varices (HCC)    Follows w/ Annamarie Major, NP @ [facility].   Gallstones    GERD (gastroesophageal reflux disease)    taking Protonix   Headache    stopped after menopause   Hemolytic anemia (HCC)    Follows w/ Dr. Burnice Logan Iruku @ [facility], see 06/15/22 OV in Epic, s/p iron infusions   Hepatic encephalopathy (HCC) 10/11/2021   treated w/ lactulose and rifaximim   History of pneumonia 08/2021   Hospital admission on 09/22/21.   Liver cirrhosis secondary to NASH [facility])    Follows w/ Dr. Leighton Parody @ [facility] and Annamarie Major, NP @ [facility].   Neuromuscular disorder (HCC)    reflex sympathetic dystrophy of LLE, follows w/ pain managemnt. Patient stated she broke her ankle again and nerve pain resolved.   PMB (postmenopausal bleeding) 2023   Thrombocytopenia  (HCC)    Follows w/ Dr. Burnice Logan Iruku @ [facility].   Urticaria    Wears glasses     Past Surgical History:  Procedure Laterality Date   APPENDECTOMY  1979   BIOPSY  09/25/2021   Procedure: BIOPSY;  Surgeon: Benancio Deeds, MD;  Location: [facility] ENDOSCOPY;  Service: Gastroenterology;;   BREAST SURGERY  1980   breast reduction   CHOLECYSTECTOMY     as a young adult   COLONOSCOPY WITH PROPOFOL N/A 09/25/2021   Procedure: COLONOSCOPY WITH PROPOFOL;  Surgeon: Benancio Deeds, MD;  Location: [facility] ENDOSCOPY;  Service: Gastroenterology;  Laterality: N/A;   DILATATION & CURETTAGE/HYSTEROSCOPY WITH MYOSURE N/A 06/19/2022   Procedure: DILATATION & CURETTAGE/HYSTEROSCOPY WITH MYOSURE LITE;  Surgeon: [name], MD;  Location: [facility];  Service: Gynecology;  Laterality: N/A;   ESOPHAGOGASTRODUODENOSCOPY N/A 09/25/2021   Procedure: ESOPHAGOGASTRODUODENOSCOPY (EGD);  Surgeon: Benancio Deeds, MD;  Location: [facility] ENDOSCOPY;  Service: Gastroenterology;  Laterality: N/A;   NASAL SEPTUM SURGERY     when patient was in her 46's   ORIF ANKLE FRACTURE Left 03/26/2021   Procedure: OPEN REDUCTION INTERNAL FIXATION (ORIF) ANKLE FRACTURE;  Surgeon: Netta Cedars, MD;  Location: [facility] OR;  Service: Orthopedics;  Laterality: Left;   POLYPECTOMY  09/25/2021   Procedure: POLYPECTOMY;  Surgeon: Benancio Deeds, MD;  Location: [facility] ENDOSCOPY;  Service: Gastroenterology;;    Family History  Problem Relation Age of Onset   Intellectual disability Mother    Hypertension Mother  Hyperlipidemia Mother    Depression Mother    Arthritis Mother    Mental illness Mother    Heart disease Father    Diabetes Father    Stroke Father    Heart disease Brother    COPD Brother    Mental illness Daughter    Learning disabilities Daughter    Diabetes Daughter    Allergic rhinitis Neg Hx    Angioedema Neg Hx    Asthma Neg Hx    Eczema Neg Hx    Immunodeficiency Neg Hx    Urticaria Neg Hx     Colon cancer Neg Hx    Pancreatic cancer Neg Hx    Liver cancer Neg Hx    Stomach cancer Neg Hx     Social History:  reports that she quit smoking about 19 years ago. Her smoking use included cigarettes. She started smoking about 34 years ago. She has a 15 pack-year smoking history. She has never used smokeless tobacco. She reports that she does not currently use alcohol. She reports that she does not use drugs.  Allergies:  Allergies  Allergen Reactions   Shellfish Allergy Anaphylaxis, Hives, Itching, Shortness Of Breath and Swelling   Amitriptyline     rash   Iodine Hives    Other Reaction(s): Other (See Comments)   Shellfish-Derived Products     No medications prior to admission.    Height 5' 4.5" (1.638 m), weight 127 kg. Office physical exam:  Constitutional *General Appearance: healthy-appearing, well-nourished, well-developed   Female Genitalia Vulva: no masses, no atrophy, no lesions Mons: normal, no erythema, no excoriation, no atrophy, no lesions, no vesicles/ ulcers, no masses, no swelling, no tenderness Labia Majora: normal, no erythema, no excoriation, no atrophy, no discoloration, no lesions, no vesicles/ ulcers, no masses, no swelling, no tenderness Labia Minora: normal, no erythema, no excoriation, no atrophy, no discoloration, no lesions, no vesicles, no masses, no swelling, no tenderness Introitus: normal Bartholin's Gland: normal *Vagina: normal, no discharge, no blood present, no erythema, no atrophy, no lesions, no ulcers, no swelling, no masses, no tenderness, no prolapse Vaginal discharge no abnormal discharge present *Cervix: grossly normal, no lesions, no discharge, no bleeding, no cervical motion tenderness *Uterus: normal size, normal contour, midline, no uterine prolapse, mobile, non-tender, anteverted *Urethral Meatus/ Urethra: normal meatus, no discharge, well supported urethra, no masses, no tenderness *Bladder: non-distended, no palpable  mass, non-tender *Adnexa/Parametria: no mass palpable, no tenderness   Neurological System Impressions: motor: no deficits, sensory: no deficits   Psychiatric *Orientation: to person, to place, to time *Mood and Affect: active and alert, normal mood, normal affect  Recent Results (from the past 2160 hour(s))  Comprehensive metabolic panel     Status: Abnormal   Collection Time: 02/24/23  5:57 PM  Result Value Ref Range   Sodium 137 135 - 145 mmol/L   Potassium 3.9 3.5 - 5.1 mmol/L   Chloride 104 98 - 111 mmol/L   CO2 27 22 - 32 mmol/L   Glucose, Bld 90 70 - 99 mg/dL    Comment: Glucose reference range applies only to samples taken after fasting for at least 8 hours.   BUN 17 8 - 23 mg/dL   Creatinine, Ser 3.53 0.44 - 1.00 mg/dL   Calcium 9.3 8.9 - 61.4 mg/dL   Total Protein 7.1 6.5 - 8.1 g/dL   Albumin 3.6 3.5 - 5.0 g/dL   AST 54 (H) 15 - 41 U/L   ALT 28 0 - 44 U/L  Alkaline Phosphatase 166 (H) 38 - 126 U/L   Total Bilirubin 1.5 (H) 0.3 - 1.2 mg/dL   GFR, Estimated >09 >60 mL/min    Comment: (NOTE) Calculated using the CKD-EPI Creatinine Equation (2021)    Anion gap 6 5 - 15    Comment: Performed at [facility], [address], [place], [place] [address]  CBC with Differential     Status: Abnormal   Collection Time: 02/24/23  5:57 PM  Result Value Ref Range   WBC 7.8 4.0 - 10.5 K/uL   RBC 4.43 3.87 - 5.11 MIL/uL   Hemoglobin 14.4 12.0 - 15.0 g/dL   HCT 81.1 91.4 - 78.2 %   MCV 93.9 80.0 - 100.0 fL   MCH 32.5 26.0 - 34.0 pg   MCHC 34.6 30.0 - 36.0 g/dL   RDW 95.6 21.3 - 08.6 %   Platelets 92 (L) 150 - 400 K/uL    Comment: SPECIMEN CHECKED FOR CLOTS REPEATED TO VERIFY PLATELETS APPEAR DECREASED    nRBC 0.0 0.0 - 0.2 %   Neutrophils Relative % 57 %   Neutro Abs 4.4 1.7 - 7.7 K/uL   Lymphocytes Relative 28 %   Lymphs Abs 2.2 0.7 - 4.0 K/uL   Monocytes Relative 10 %   Monocytes Absolute 0.8 0.1 - 1.0 K/uL   Eosinophils Relative 4 %    Eosinophils Absolute 0.3 0.0 - 0.5 K/uL   Basophils Relative 1 %   Basophils Absolute 0.1 0.0 - 0.1 K/uL   Immature Granulocytes 0 %   Abs Immature Granulocytes 0.02 0.00 - 0.07 K/uL    Comment: Performed at [facility], [address] [place], [place] [address]  Urinalysis, Routine w reflex microscopic -Urine, Clean Catch     Status: None   Collection Time: 02/24/23  5:57 PM  Result Value Ref Range   Color, Urine YELLOW YELLOW   APPearance CLEAR CLEAR   Specific Gravity, Urine 1.017 1.005 - 1.030   pH 6.5 5.0 - 8.0   Glucose, UA NEGATIVE NEGATIVE mg/dL   Hgb urine dipstick NEGATIVE NEGATIVE   Bilirubin Urine NEGATIVE NEGATIVE   Ketones, ur NEGATIVE NEGATIVE mg/dL   Protein, ur NEGATIVE NEGATIVE mg/dL   Nitrite NEGATIVE NEGATIVE   Leukocytes,Ua NEGATIVE NEGATIVE    Comment: Performed at [facility], [address], [place], [place] [address]  Ammonia     Status: Abnormal   Collection Time: 02/24/23  5:57 PM  Result Value Ref Range   Ammonia 87 (H) 9 - 35 umol/L    Comment: Performed at [facility], [address], [place], [place] [address]  D-dimer, quantitative     Status: Abnormal   Collection Time: 02/24/23  5:57 PM  Result Value Ref Range   D-Dimer, Quant 1.35 (H) 0.00 - 0.50 ug/mL-FEU    Comment: (NOTE) At the manufacturer cut-off value of 0.5 g/mL FEU, this assay has a negative predictive value of 95-100%.This assay is intended for use in conjunction with a clinical pretest probability (PTP) assessment model to exclude pulmonary embolism (PE) and deep venous thrombosis (DVT) in outpatients suspected of PE or DVT. Results should be correlated with clinical presentation. Performed at [facility], [address], [place], [place] [address]   CBG monitoring, ED     Status: None   Collection Time: 02/24/23  6:00 PM  Result Value Ref Range   Glucose-Capillary 86 70 - 99 mg/dL    Comment:  Glucose reference range applies only to  samples taken after fasting for at least 8 hours.     Assessment/Plan: Theresa Moran is an 62 y.o. female. G1P1 presenting for scheduled hysteroscopy, D&C, polypectomy. Risks discussed including infection, bleeding, damage to surrounding structures, need for additional procedures, postoperative complications. All questions answered. Consents signed in clinic and preoperatively.   Plan for discharge from PACU - will be for Mirena IUD insertion in the office  [name] 04/02/2023, 8:37 AM

## 2023-04-03 NOTE — Anesthesia Preprocedure Evaluation (Signed)
Anesthesia Evaluation  Patient identified by MRN, date of birth, ID band Patient awake    Reviewed: Allergy & Precautions, NPO status , Patient's Chart, lab work & pertinent test results  History of Anesthesia Complications Negative for: history of anesthetic complications  Airway Mallampati: I  TM Distance: >3 FB Neck ROM: Full    Dental  (+) Dental Advisory Given   Pulmonary neg pulmonary ROS, neg shortness of breath, neg sleep apnea, neg recent URI, former smoker   Pulmonary exam normal breath sounds clear to auscultation       Cardiovascular (-) hypertensionpulmonary hypertension (moderate)(-) angina (-) Past MI, (-) Cardiac Stents and (-) CABG (-) dysrhythmias + Valvular Problems/Murmurs (mild) MR  Rhythm:Regular Rate:Normal  TTE 09/22/2021: IMPRESSIONS     1. Left ventricular ejection fraction, by estimation, is 60 to 65%. The  left ventricle has normal function. The left ventricle has no regional  wall motion abnormalities. Left ventricular diastolic parameters were  normal.   2. Right ventricular systolic function is normal. The right ventricular  size is normal. There is moderately elevated pulmonary artery systolic  pressure.   3. Left atrial size was mildly dilated.   4. The mitral valve is grossly normal. Mild mitral valve regurgitation.  No evidence of mitral stenosis.   5. The aortic valve is grossly normal. There is mild calcification of the  aortic valve. There is mild thickening of the aortic valve. Aortic valve  regurgitation is not visualized. Aortic valve sclerosis/calcification is  present, without any evidence of  aortic stenosis.   6. The inferior vena cava is dilated in size with >50% respiratory  variability, suggesting right atrial pressure of 8 mmHg.     Neuro/Psych  Headaches, neg Seizures PSYCHIATRIC DISORDERS Anxiety Depression    Chronic pain  Neuromuscular disease (RSD of LLE, resolved)     GI/Hepatic ,GERD  Medicated,,(+) Cirrhosis  (secondary to NASH, no recent bleeds)  Esophageal Varices      Endo/Other  neg diabetes  Morbid obesity  Renal/GU negative Renal ROS     Musculoskeletal  (+) Arthritis ,    Abdominal  (+) + obese  Peds  Hematology  (+) Blood dyscrasia (thrombocytopenia), anemia Lab Results      Component                Value               Date                      WBC                      7.8                 02/24/2023                HGB                      14.4                02/24/2023                HCT                      41.6                02/24/2023                MCV  93.9                02/24/2023                PLT                      92 (L)              02/24/2023              Anesthesia Other Findings   Reproductive/Obstetrics                             Anesthesia Physical Anesthesia Plan  ASA: 3  Anesthesia Plan: General   Post-op Pain Management:    Induction: Intravenous  PONV Risk Score and Plan: 3 and Ondansetron, Dexamethasone and Treatment may vary due to age or medical condition  Airway Management Planned: LMA  Additional Equipment:   Intra-op Plan:   Post-operative Plan: Extubation in OR  Informed Consent: I have reviewed the patients History and Physical, chart, labs and discussed the procedure including the risks, benefits and alternatives for the proposed anesthesia with the patient or authorized representative who has indicated his/her understanding and acceptance.     Dental advisory given  Plan Discussed with: CRNA and Anesthesiologist  Anesthesia Plan Comments: (Risks of general anesthesia discussed including, but not limited to, sore throat, hoarse voice, chipped/damaged teeth, injury to vocal cords, nausea and vomiting, allergic reactions, lung infection, heart attack, stroke, and death. All questions answered. )       Anesthesia Quick  Evaluation

## 2023-04-04 ENCOUNTER — Other Ambulatory Visit: Payer: Self-pay | Admitting: Family Medicine

## 2023-04-05 ENCOUNTER — Other Ambulatory Visit: Payer: Self-pay

## 2023-04-05 ENCOUNTER — Ambulatory Visit (HOSPITAL_BASED_OUTPATIENT_CLINIC_OR_DEPARTMENT_OTHER): Payer: Medicare (Managed Care) | Admitting: Anesthesiology

## 2023-04-05 ENCOUNTER — Encounter (HOSPITAL_BASED_OUTPATIENT_CLINIC_OR_DEPARTMENT_OTHER): Payer: Self-pay | Admitting: Obstetrics and Gynecology

## 2023-04-05 ENCOUNTER — Ambulatory Visit (HOSPITAL_BASED_OUTPATIENT_CLINIC_OR_DEPARTMENT_OTHER)
Admission: RE | Admit: 2023-04-05 | Discharge: 2023-04-05 | Disposition: A | Discharge status: Home / Self Care | Payer: Medicare (Managed Care) | Attending: Obstetrics and Gynecology | Admitting: Obstetrics and Gynecology

## 2023-04-05 ENCOUNTER — Encounter (HOSPITAL_BASED_OUTPATIENT_CLINIC_OR_DEPARTMENT_OTHER)
Admission: RE | Disposition: A | Discharge status: Home / Self Care | Payer: Self-pay | Source: Home / Self Care | Attending: Obstetrics and Gynecology

## 2023-04-05 DIAGNOSIS — N840 Polyp of corpus uteri: Secondary | ICD-10-CM | POA: Insufficient documentation

## 2023-04-05 DIAGNOSIS — K7460 Unspecified cirrhosis of liver: Secondary | ICD-10-CM | POA: Diagnosis not present

## 2023-04-05 DIAGNOSIS — G8929 Other chronic pain: Secondary | ICD-10-CM | POA: Insufficient documentation

## 2023-04-05 DIAGNOSIS — K7581 Nonalcoholic steatohepatitis (NASH): Secondary | ICD-10-CM | POA: Insufficient documentation

## 2023-04-05 DIAGNOSIS — K219 Gastro-esophageal reflux disease without esophagitis: Secondary | ICD-10-CM | POA: Diagnosis not present

## 2023-04-05 DIAGNOSIS — N950 Postmenopausal bleeding: Secondary | ICD-10-CM | POA: Diagnosis present

## 2023-04-05 DIAGNOSIS — I10 Essential (primary) hypertension: Secondary | ICD-10-CM | POA: Diagnosis not present

## 2023-04-05 DIAGNOSIS — Z87891 Personal history of nicotine dependence: Secondary | ICD-10-CM | POA: Diagnosis not present

## 2023-04-05 DIAGNOSIS — N8003 Adenomyosis of the uterus: Secondary | ICD-10-CM | POA: Diagnosis not present

## 2023-04-05 DIAGNOSIS — Z6841 Body Mass Index (BMI) 40.0 and over, adult: Secondary | ICD-10-CM | POA: Diagnosis not present

## 2023-04-05 DIAGNOSIS — Z01818 Encounter for other preprocedural examination: Secondary | ICD-10-CM

## 2023-04-05 HISTORY — PX: DILATATION & CURETTAGE/HYSTEROSCOPY WITH MYOSURE: SHX6511

## 2023-04-05 LAB — TYPE AND SCREEN
ABO/RH(D): O NEG
Antibody Screen: NEGATIVE

## 2023-04-05 SURGERY — DILATATION & CURETTAGE/HYSTEROSCOPY WITH MYOSURE
Anesthesia: General | Site: Uterus

## 2023-04-05 MED ORDER — SODIUM CHLORIDE 0.9 % IV SOLN: INTRAVENOUS | Status: DC

## 2023-04-05 MED ORDER — PROPOFOL 1000 MG/100ML IV EMUL
INTRAVENOUS | Status: AC
  Filled 2023-04-05: qty 100

## 2023-04-05 MED ORDER — MIDAZOLAM HCL 2 MG/2ML IJ SOLN
INTRAMUSCULAR | Status: DC | PRN
  Administered 2023-04-05: 2 mg via INTRAVENOUS

## 2023-04-05 MED ORDER — ARTIFICIAL TEARS OPHTHALMIC OINT
TOPICAL_OINTMENT | OPHTHALMIC | Status: AC
  Filled 2023-04-05: qty 3.5

## 2023-04-05 MED ORDER — LIDOCAINE HCL 1 % IJ SOLN
INTRAMUSCULAR | Status: DC | PRN
  Administered 2023-04-05: 10 mL

## 2023-04-05 MED ORDER — AMISULPRIDE (ANTIEMETIC) 5 MG/2ML IV SOLN: 10.0000 mg | Freq: Once | INTRAVENOUS | Status: DC | PRN

## 2023-04-05 MED ORDER — PROPOFOL 10 MG/ML IV BOLUS
INTRAVENOUS | Status: AC
  Filled 2023-04-05: qty 20

## 2023-04-05 MED ORDER — MIDAZOLAM HCL 2 MG/2ML IJ SOLN
INTRAMUSCULAR | Status: AC
  Filled 2023-04-05: qty 2

## 2023-04-05 MED ORDER — LACTATED RINGERS IV SOLN: INTRAVENOUS | Status: DC

## 2023-04-05 MED ORDER — SODIUM CHLORIDE 0.9 % IR SOLN
Status: DC | PRN
  Administered 2023-04-05: 3000 mL

## 2023-04-05 MED ORDER — OXYCODONE HCL 5 MG/5ML PO SOLN: 5.0000 mg | Freq: Once | ORAL | Status: DC | PRN

## 2023-04-05 MED ORDER — FENTANYL CITRATE (PF) 100 MCG/2ML IJ SOLN: 25.0000 ug | INTRAMUSCULAR | Status: DC | PRN

## 2023-04-05 MED ORDER — ONDANSETRON HCL 4 MG/2ML IJ SOLN
INTRAMUSCULAR | Status: DC | PRN
  Administered 2023-04-05: 4 mg via INTRAVENOUS

## 2023-04-05 MED ORDER — POVIDONE-IODINE 10 % EX SWAB: 2.0000 | Freq: Once | CUTANEOUS | Status: DC

## 2023-04-05 MED ORDER — LIDOCAINE 2% (20 MG/ML) 5 ML SYRINGE
INTRAMUSCULAR | Status: DC | PRN
  Administered 2023-04-05: 100 mg via INTRAVENOUS

## 2023-04-05 MED ORDER — DEXAMETHASONE SODIUM PHOSPHATE 10 MG/ML IJ SOLN
INTRAMUSCULAR | Status: DC | PRN
  Administered 2023-04-05: 5 mg via INTRAVENOUS

## 2023-04-05 MED ORDER — FENTANYL CITRATE (PF) 250 MCG/5ML IJ SOLN
INTRAMUSCULAR | Status: DC | PRN
  Administered 2023-04-05: 50 ug via INTRAVENOUS

## 2023-04-05 MED ORDER — DEXAMETHASONE SODIUM PHOSPHATE 10 MG/ML IJ SOLN
INTRAMUSCULAR | Status: AC
  Filled 2023-04-05: qty 1

## 2023-04-05 MED ORDER — ONDANSETRON HCL 4 MG/2ML IJ SOLN
INTRAMUSCULAR | Status: AC
  Filled 2023-04-05: qty 2

## 2023-04-05 MED ORDER — SILVER NITRATE-POT NITRATE 75-25 % EX MISC
CUTANEOUS | Status: DC | PRN
  Administered 2023-04-05: 4

## 2023-04-05 MED ORDER — PROPOFOL 10 MG/ML IV BOLUS
INTRAVENOUS | Status: DC | PRN
  Administered 2023-04-05: 200 mg via INTRAVENOUS

## 2023-04-05 MED ORDER — LIDOCAINE HCL (PF) 2 % IJ SOLN
INTRAMUSCULAR | Status: AC
  Filled 2023-04-05: qty 5

## 2023-04-05 MED ORDER — STERILE WATER FOR IRRIGATION IR SOLN
Status: DC | PRN
  Administered 2023-04-05: 500 mL

## 2023-04-05 MED ORDER — FENTANYL CITRATE (PF) 100 MCG/2ML IJ SOLN
INTRAMUSCULAR | Status: AC
  Filled 2023-04-05: qty 2

## 2023-04-05 MED ORDER — OXYCODONE HCL 5 MG PO TABS: 5.0000 mg | ORAL_TABLET | Freq: Once | ORAL | Status: DC | PRN

## 2023-04-05 SURGICAL SUPPLY — 22 items
CATH ROBINSON RED A/P 16FR (CATHETERS) IMPLANT
DEVICE MYOSURE LITE (MISCELLANEOUS) IMPLANT
DEVICE MYOSURE REACH (MISCELLANEOUS) IMPLANT
DILATOR CANAL MILEX (MISCELLANEOUS) IMPLANT
GAUZE 4X4 16PLY ~~LOC~~+RFID DBL (SPONGE) IMPLANT
GLOVE BIOGEL PI IND STRL 6 (GLOVE) ×1 IMPLANT
GLOVE SS PI 5.5 STRL (GLOVE) ×1 IMPLANT
GOWN STRL REUS W/ TWL LRG LVL3 (GOWN DISPOSABLE) ×1 IMPLANT
GOWN STRL REUS W/TWL LRG LVL3 (GOWN DISPOSABLE) ×1
IV NS IRRIG 3000ML ARTHROMATIC (IV SOLUTION) ×1 IMPLANT
KIT PROCEDURE FLUENT (KITS) ×1 IMPLANT
KIT TURNOVER CYSTO (KITS) ×1 IMPLANT
MYOSURE XL FIBROID (MISCELLANEOUS)
PACK VAGINAL MINOR WOMEN LF (CUSTOM PROCEDURE TRAY) ×1 IMPLANT
PAD OB MATERNITY 4.3X12.25 (PERSONAL CARE ITEMS) ×1 IMPLANT
PAD PREP 24X48 CUFFED NSTRL (MISCELLANEOUS) ×1 IMPLANT
SEAL CERVICAL OMNI LOK (ABLATOR) IMPLANT
SEAL ROD LENS SCOPE MYOSURE (ABLATOR) ×1 IMPLANT
SLEEVE SCD COMPRESS KNEE MED (STOCKING) ×1 IMPLANT
SYSTEM TISS REMOVAL MYOSURE XL (MISCELLANEOUS) IMPLANT
TOWEL OR 17X24 6PK STRL BLUE (TOWEL DISPOSABLE) ×1 IMPLANT
WATER STERILE IRR 500ML POUR (IV SOLUTION) IMPLANT

## 2023-04-05 NOTE — Op Note (Signed)
PREOPERATIVE DIAGNOSES: Postmenopausal bleeding Endometrial polyp  POSTOPERATIVE DIAGNOSES: Same  PROCEDURE PERFORMED: Hysteroscopy, polypectomy, dilation and curettage  SURGEON: Jule Economy  ANESTHESIA: GET  ESTIMATED BLOOD LOSS: 10cc.  COMPLICATIONS: None  TUBES: None.  DRAINS: None  PATHOLOGY: endometrial polyp, endometrial curettings  FINDINGS: On exam, under anesthesia, normal appearing vulva and vagina, cervix normal-appearing. Bilateral tubal ostia visualized hysteroscopically, endometrium with some fluffy-appearing areas. One endometrial polyp visualized coming off of posterior wall.  Procedure: The patient was taken to the operating room where she was properly prepped and draped in sterile manner under general anesthesia. After bimanual examination, the cervix was exposed with a speculum and the anterior lip of the cervix grasped with a tenaculum. Paracervical block performed. The endocervical canal was then progressively dilated. The hysteroscope was introduced with findings as above. The Myosure lite was used to remove the endometrial polyp. Curettage was performed with curette. The hysteroscope was reintroduced and cavity survey performed. Silver nitrage was applied to tenaculum sites and excellent hemostasis was noted. All instruments were removed from vagina. The sponge and lap counts were correct times 2 at this time. The patient's procedure was terminated. We then awakened her. She was sent to the Recovery Room in good condition.   Jule Economy, MD

## 2023-04-05 NOTE — Transfer of Care (Signed)
Immediate Anesthesia Transfer of Care Note  Patient: Theresa Moran  Procedure(s) Performed: DILATATION & CURETTAGE/HYSTEROSCOPY WITH MYOSURE LITE (Uterus)  Patient Location: PACU  Anesthesia Type:General  Level of Consciousness: drowsy and patient cooperative  Airway & Oxygen Therapy: Patient Spontanous Breathing  Post-op Assessment: Report given to RN and Post -op Vital signs reviewed and stable  Post vital signs: Reviewed and stable  Last Vitals:  Vitals Value Taken Time  BP 119/61 04/05/23 0806  Temp    Pulse 97 04/05/23 0807  Resp 10 04/05/23 0807  SpO2 98 % 04/05/23 0807  Vitals shown include unfiled device data.  Last Pain:  Vitals:   04/05/23 0553  TempSrc: Oral         Complications: No notable events documented.

## 2023-04-05 NOTE — Discharge Instructions (Signed)
  Post Anesthesia Home Care Instructions  Activity: Get plenty of rest for the remainder of the day. A responsible individual must stay with you for 24 hours following the procedure.  For the next 24 hours, DO NOT: -Drive a car -Advertising copywriter -Drink alcoholic beverages -Take any medication unless instructed by your physician -Make any legal decisions or sign important papers.  Meals: Start with liquid foods such as gelatin or soup. Progress to regular foods as tolerated. Avoid greasy, spicy, heavy foods. If nausea and/or vomiting occur, drink only clear liquids until the nausea and/or vomiting subsides. Call your physician if vomiting continues.  Special Instructions/Symptoms: Your throat may feel dry or sore from the anesthesia or the breathing tube placed in your throat during surgery. If this causes discomfort, gargle with warm salt water. The discomfort should disappear within 24 hours.      D & C Home care Instructions:   Personal hygiene:  Used sanitary napkins for vaginal drainage not tampons. Shower or tub bathe the day after your procedure. No douching until bleeding stops. Always wipe from front to back after  Elimination.  Activity: Do not drive or operate any equipment today. The effects of the anesthesia are still present and drowsiness may result. Rest today, not necessarily flat bed rest, just take it easy. You may resume your normal activity in one to 2 days.  Sexual activity: No intercourse for one week or as indicated by your physician  Diet: Eat a light diet as desired this evening. You may resume a regular diet tomorrow.  Return to work: One to 2 days.  General Expectations of your surgery: Vaginal bleeding should be no heavier than a normal period. Spotting may continue up to 10 days. Mild cramps may continue for a couple of days. You may have a regular period in 2-6 weeks.  Unexpected observations call your doctor if these occur: persistent or heavy  bleeding. Severe abdominal cramping or pain. Elevation of temperature greater than 100F.

## 2023-04-05 NOTE — Anesthesia Procedure Notes (Signed)
Procedure Name: LMA Insertion Date/Time: 04/05/2023 7:34 AM  Performed by: Dairl Ponder, CRNAPre-anesthesia Checklist: Patient identified, Emergency Drugs available, Suction available and Patient being monitored Patient Re-evaluated:Patient Re-evaluated prior to induction Oxygen Delivery Method: Circle System Utilized Preoxygenation: Pre-oxygenation with 100% oxygen Induction Type: IV induction Ventilation: Mask ventilation without difficulty LMA: LMA inserted LMA Size: 4.0 Number of attempts: 1 Airway Equipment and Method: Bite block Placement Confirmation: positive ETCO2 Tube secured with: Tape Dental Injury: Teeth and Oropharynx as per pre-operative assessment

## 2023-04-05 NOTE — Interval H&P Note (Signed)
History and Physical Interval Note:  04/05/2023 7:18 AM  Theresa Moran  has presented today for surgery, with the diagnosis of POSTMENOPAUSAL BLEEDING.  The various methods of treatment have been discussed with the patient and family. After consideration of risks, benefits and other options for treatment, the patient has consented to  Procedure(s): DILATATION & CURETTAGE/HYSTEROSCOPY WITH MYOSURE LITE (N/A) as a surgical intervention.  The patient's history has been reviewed, patient examined, no change in status, stable for surgery.  I have reviewed the patient's chart and labs.  Questions were answered to the patient's satisfaction.    [name]

## 2023-04-05 NOTE — Anesthesia Postprocedure Evaluation (Signed)
Anesthesia Post Note  Patient: Theresa Moran  Procedure(s) Performed: DILATATION & CURETTAGE/HYSTEROSCOPY WITH MYOSURE LITE (Uterus)     Patient location during evaluation: PACU Anesthesia Type: General Level of consciousness: awake Pain management: pain level controlled Vital Signs Assessment: post-procedure vital signs reviewed and stable Respiratory status: spontaneous breathing, nonlabored ventilation and respiratory function stable Cardiovascular status: blood pressure returned to baseline and stable Postop Assessment: no apparent nausea or vomiting Anesthetic complications: no   No notable events documented.  Last Vitals:  Vitals:   04/05/23 0845 04/05/23 0900  BP: 139/64   Pulse: 97 92  Resp: 11 18  Temp: 36.4 C   SpO2: 98% 98%    Last Pain:  Vitals:   04/05/23 0900  TempSrc:   PainSc: 0-No pain                 Linton Rump

## 2023-04-06 LAB — SURGICAL PATHOLOGY

## 2023-04-07 ENCOUNTER — Encounter (HOSPITAL_BASED_OUTPATIENT_CLINIC_OR_DEPARTMENT_OTHER): Payer: Self-pay | Admitting: Obstetrics and Gynecology

## 2023-04-08 ENCOUNTER — Ambulatory Visit
Admission: RE | Admit: 2023-04-08 | Discharge: 2023-04-08 | Disposition: A | Discharge status: Home / Self Care | Payer: Medicare (Managed Care) | Source: Ambulatory Visit | Attending: Nurse Practitioner | Admitting: Nurse Practitioner

## 2023-04-08 DIAGNOSIS — K743 Primary biliary cirrhosis: Secondary | ICD-10-CM

## 2023-04-08 DIAGNOSIS — I8510 Secondary esophageal varices without bleeding: Secondary | ICD-10-CM

## 2023-04-08 DIAGNOSIS — K7682 Hepatic encephalopathy: Secondary | ICD-10-CM

## 2023-04-08 DIAGNOSIS — K7460 Unspecified cirrhosis of liver: Secondary | ICD-10-CM

## 2023-04-20 ENCOUNTER — Encounter: Payer: Self-pay | Admitting: Family Medicine

## 2023-04-20 ENCOUNTER — Ambulatory Visit: Payer: Medicare (Managed Care) | Admitting: Family Medicine

## 2023-04-20 VITALS — BP 138/88 | HR 99 | Temp 97.9°F | Ht 64.5 in | Wt 290.4 lb

## 2023-04-20 DIAGNOSIS — K7469 Other cirrhosis of liver: Secondary | ICD-10-CM

## 2023-04-20 DIAGNOSIS — R7301 Impaired fasting glucose: Secondary | ICD-10-CM | POA: Diagnosis not present

## 2023-04-20 DIAGNOSIS — G3184 Mild cognitive impairment, so stated: Secondary | ICD-10-CM

## 2023-04-20 DIAGNOSIS — Z23 Encounter for immunization: Secondary | ICD-10-CM | POA: Diagnosis not present

## 2023-04-20 DIAGNOSIS — K7581 Nonalcoholic steatohepatitis (NASH): Secondary | ICD-10-CM | POA: Diagnosis not present

## 2023-04-20 DIAGNOSIS — F32A Depression, unspecified: Secondary | ICD-10-CM | POA: Diagnosis not present

## 2023-04-20 LAB — POCT GLYCOSYLATED HEMOGLOBIN (HGB A1C): Hemoglobin A1C: 5.7 % — AB (ref 4.0–5.6)

## 2023-04-20 MED ORDER — NORTRIPTYLINE HCL 50 MG PO CAPS: 100.0000 mg | ORAL_CAPSULE | Freq: Every day | ORAL | 1 refills | Status: DC

## 2023-04-20 NOTE — Assessment & Plan Note (Signed)
Chronic.  Controlled.  Continue buspar 10mg  bid and nortirptylline 100mg  at hs.

## 2023-04-20 NOTE — Assessment & Plan Note (Signed)
Chronic.  Controlled.  Continue coreg 3.125 mg bid, xifaxan 550mg  bic, lasix 20 mg daily, spironolactone 25 mg daily, lactulose.  Work on weight loss.  Per note from hep, consider banding varicies to possibly get off coreg d/t hypotension.  Advised pt to f/u GI

## 2023-04-20 NOTE — Assessment & Plan Note (Signed)
Chronic.  Mostly controlled.  Seeing liver xplant.  LFT's have increased some since being off Mounjaro(has gained few pounds).  Continue medications.

## 2023-04-20 NOTE — Progress Notes (Signed)
Subjective:     Patient ID: Theresa Moran, female    DOB: October 29, 1960, 62 y.o.   MRN: [id number]  Chief Complaint  Patient presents with   Cirrhosis   Anemia    HPI - She is accompanied by her partner.   Cirrhosis / MASH - Pt on Coreg 3.125 mg BID, Xifaxan 550 mg BID, Lasix 20 mg daily, Spironolactone 25 mg, and Lactulose 30 mL 2-3 times daily. Insurance not covering [place] but was making huge difference. She is established with Annamarie Major, NP of hepatology, last seen on 11/7. Pt reports some diarrhea, stating this is not new for her(from Lactulose). She endorses episodes of heavy vaginal bleeding following another D&C. Was going through largest size of maxi-pads every 45 minutes. IUD was recommended by her gynecologist, Dr. Imogene Burn. She also endorses episodes of dizziness when bending over, which she believes may be attributed to her coreg dropping her BP. Reports edema, mostly on her left lower extremity. Previously was on midodrine, discontinued in May. Denies chest pain or palpitations.   Memory - She continues to struggle with memory and word-finding. Also reports frequent falls following her return from [employer]. Her partner states the trip may have worn her out and contributed to the frequency of her falls. She has been playing matching and hidden picture games on her phone to stay mentally engaged. She states playing with her grandkids also helps her stay engaged. Has not yet been seen by neuro. Has not been taking B vitamins   Anxiety/Depression - Pt is on Buspar 10 mg BID and Nortriptyline 100 mg at nighttime. Nortriptyline has been helpful for her moods. Tolerating medications well with no side effects. No SI  Chronic pain - Pt taking Baclofen BID. Not currently taking any pain meds.   Pt staying active by doing water aerobics 3x/week at [facility].     There are no preventive care reminders to display for this patient.   Past Medical History:  Diagnosis Date   Allergy  to environmental factors    Anxiety    Follows w/ PCP Dr. Rondel Jumbo @ [facility].   Arthritis    ankles   Bilateral lower extremity edema 2023   Chronic pain    LLE reflex sympathetic dystrophy, follws with Pain Management, Yehuda Budd, PA.   COVID-19 03/08/2021   Depression    Follows w/ PCP.   Esophageal varices (HCC)    Follows w/ Annamarie Major, NP @ [facility].   Gallstones    GERD (gastroesophageal reflux disease)    taking Protonix   Headache    stopped after menopause   Hemolytic anemia (HCC)    Follows w/ Dr. Burnice Logan Iruku @ [facility], see 06/15/22 OV in Epic, s/p iron infusions   Hepatic encephalopathy (HCC) 10/11/2021   treated w/ lactulose and rifaximim   History of pneumonia 08/2021   Hospital admission on 09/22/21.   Liver cirrhosis secondary to NASH [facility])    Follows w/ Dr. Leighton Parody @ [facility] and Annamarie Major, NP @ [facility].   Neuromuscular disorder (HCC)    reflex sympathetic dystrophy of LLE, follows w/ pain managemnt. Patient stated she broke her ankle again and nerve pain resolved.   PMB (postmenopausal bleeding) 2023   Thrombocytopenia (HCC)    Follows w/ Dr. Burnice Logan Iruku @ [facility].   Urticaria    Wears glasses     Past Surgical History:  Procedure  Laterality Date   APPENDECTOMY  1979   BIOPSY  09/25/2021   Procedure: BIOPSY;  Surgeon: Benancio Deeds, MD;  Location: [facility] ENDOSCOPY;  Service: Gastroenterology;;   BREAST SURGERY  1980   breast reduction   CHOLECYSTECTOMY     as a young adult   COLONOSCOPY WITH PROPOFOL N/A 09/25/2021   Procedure: COLONOSCOPY WITH PROPOFOL;  Surgeon: Benancio Deeds, MD;  Location: [facility] ENDOSCOPY;  Service: Gastroenterology;  Laterality: N/A;   DILATATION & CURETTAGE/HYSTEROSCOPY WITH MYOSURE N/A 06/19/2022   Procedure: DILATATION & CURETTAGE/HYSTEROSCOPY WITH MYOSURE LITE;  Surgeon: [name], MD;  Location: [facility];  Service: Gynecology;  Laterality: N/A;   DILATATION & CURETTAGE/HYSTEROSCOPY WITH MYOSURE N/A 04/05/2023   Procedure: DILATATION & CURETTAGE/HYSTEROSCOPY WITH MYOSURE LITE;  Surgeon: [name], MD;  Location: [facility];  Service: Gynecology;  Laterality: N/A;   ESOPHAGOGASTRODUODENOSCOPY N/A 09/25/2021   Procedure: ESOPHAGOGASTRODUODENOSCOPY (EGD);  Surgeon: Benancio Deeds, MD;  Location: [facility] ENDOSCOPY;  Service: Gastroenterology;  Laterality: N/A;   NASAL SEPTUM SURGERY     when patient was in her 50's   ORIF ANKLE FRACTURE Left 03/26/2021   Procedure: OPEN REDUCTION INTERNAL FIXATION (ORIF) ANKLE FRACTURE;  Surgeon: Netta Cedars, MD;  Location: [facility] OR;  Service: Orthopedics;  Laterality: Left;   POLYPECTOMY  09/25/2021   Procedure: POLYPECTOMY;  Surgeon: Benancio Deeds, MD;  Location: [facility] ENDOSCOPY;  Service: Gastroenterology;;     Current Outpatient Medications:    baclofen (LIORESAL) 20 MG tablet, Take 1 tablet (20 mg total) by mouth 2 (two) times daily., Disp: 180 each, Rfl: 3   busPIRone (BUSPAR) 10 MG tablet, TAKE 1 TABLET(10 MG) BY MOUTH TWICE DAILY, Disp: 180 tablet, Rfl: 1   carvedilol (COREG) 3.125 MG tablet, TAKE 1 TABLET(3.125 MG) BY MOUTH TWICE DAILY WITH A MEAL, Disp: 180 tablet, Rfl: 1   EPINEPHrine 0.3 mg/0.3 mL IJ SOAJ injection, Inject into the muscle. (Patient not taking: Reported on 12/14/2022), Disp: , Rfl:    furosemide (LASIX) 20 MG tablet, TAKE 1 TABLET(20 MG) BY MOUTH DAILY, Disp: 90 tablet, Rfl: 1   lactulose (CHRONULAC) 10 GM/15ML solution, Take 30 mLs by mouth 3 (three) times daily. Takes 2 to 3 times daily., Disp: , Rfl:    nortriptyline (PAMELOR) 50 MG capsule, Take 2 capsules (100 mg total) by mouth at bedtime., Disp: 180 capsule, Rfl: 1   pantoprazole (PROTONIX) 40 MG tablet, TAKE 1 TABLET(40 MG) BY MOUTH DAILY, Disp: 90 tablet, Rfl: 3   rifaximin (XIFAXAN) 550 MG TABS tablet, Take 550 mg by mouth in the morning and at  bedtime., Disp: , Rfl:    spironolactone (ALDACTONE) 25 MG tablet, TAKE 1 TABLET(25 MG) BY MOUTH DAILY, Disp: 90 tablet, Rfl: 1  Allergies  Allergen Reactions   Shellfish Allergy Anaphylaxis, Hives, Itching, Shortness Of Breath and Swelling   Amitriptyline     rash   Iodine Hives    Other Reaction(s): Other (See Comments)   Shellfish-Derived Products    ROS neg/noncontributory except as noted HPI/below      Objective:     BP 138/88   Pulse 99   Temp 97.9 F (36.6 C)   Ht 5' 4.5" (1.638 m)   Wt 290 lb 6.4 oz (131.7 kg)   SpO2 99%   BMI 49.08 kg/m  Wt Readings from Last 3 Encounters:  04/20/23 290 lb 6.4 oz (131.7 kg)  04/05/23 288 lb 14.4 oz (131 kg)  02/24/23 280 lb (127  kg)    Physical Exam   Gen: WDWN NAD HEENT: NCAT, conjunctiva not injected, sclera nonicteric NECK:  supple, no thyromegaly, no nodes, no carotid bruits CARDIAC: RRR, S1S2+, no murmur. DP 2+B LUNGS: CTAB. No wheezes ABDOMEN:  BS+, soft, NTND, No HSM, no masses EXT:  +Trace edema, L>R. Mostly Left.  MSK: no gross abnormalities.  NEURO: A&O x3.  CN II-XII intact.  PSYCH: normal mood. Good eye contact  Reviewed labs from hepatologist.   Results for orders placed or performed in visit on 04/20/23  POCT HgB A1C  Result Value Ref Range   Hemoglobin A1C 5.7 (A) 4.0 - 5.6 %      Assessment & Plan:  Metabolic dysfunction-associated steatohepatitis (MASH) Assessment & Plan: Chronic.  Mostly controlled.  Seeing liver xplant.  LFT's have increased some since being off Mounjaro(has gained few pounds).  Continue medications.     Depressive disorder Assessment & Plan: Chronic.  Controlled.  Continue buspar 10mg  bid and nortirptylline 100mg  at hs.   Impaired fasting glucose -     POCT glycosylated hemoglobin (Hb A1C)  Need for influenza vaccination -     Flu vaccine trivalent PF, 6mos and older(Flulaval,Afluria,Fluarix,Fluzone)  Other cirrhosis of liver (HCC) Assessment & Plan: Chronic.   Controlled.  Continue coreg 3.125 mg bid, xifaxan 550mg  bic, lasix 20 mg daily, spironolactone 25 mg daily, lactulose.  Work on weight loss.  Per note from hep, consider banding varicies to possibly get off coreg d/t hypotension.  Advised pt to f/u GI   Mild cognitive impairment  Other orders -     Nortriptyline HCl; Take 2 capsules (100 mg total) by mouth at bedtime.  Dispense: 180 capsule; Refill: 1  Cognitive impairment.- not sure if cirrhosis, aging, alzheimer's, other.  Continue exercise, puzzles, restart b vits and refer neuro  Return in about 6 months (around 10/18/2023) for annual physical.    I, Isabelle Course, acting as a scribe for Angelena Sole, MD., have documented all relevant documentation on the behalf of Angelena Sole, MD, as directed by  Angelena Sole, MD while in the presence of Angelena Sole, MD.  I, Angelena Sole, MD, have reviewed all documentation for this visit. The documentation on 04/20/23 for the exam, diagnosis, procedures, and orders are all accurate and complete.  Angelena Sole, MD

## 2023-04-20 NOTE — Patient Instructions (Addendum)
It was very nice to see you today!  Sch to see GI Check on pt assistance for moujaro/ozempic/zepbound/wegovy  Happy Holidays   PLEASE NOTE:  If you had any lab tests please let us know if you have not heard back within a few days. You may see your results on MyChart before we have a chance to review them but we will give you a call once they are reviewed by [place]. If we ordered any referrals today, please let us know if you have not heard from their office within the next week.   Please try these tips to maintain a healthy lifestyle:  Eat most of your calories during the day when you are active. Eliminate processed foods including packaged sweets (pies, cakes, cookies), reduce intake of potatoes, white bread, white pasta, and white rice. Look for whole grain options, oat flour or almond flour.  Each meal should contain half fruits/vegetables, one quarter protein, and one quarter carbs (no bigger than a computer mouse).  Cut down on sweet beverages. This includes juice, soda, and sweet tea. Also watch fruit intake, though this is a healthier sweet option, it still contains natural sugar! Limit to 3 servings daily.  Drink at least 1 glass of water with each meal and aim for at least 8 glasses per day  Exercise at least 150 minutes every week.

## 2023-04-21 ENCOUNTER — Encounter: Payer: Self-pay | Admitting: Physician Assistant

## 2023-05-15 NOTE — Progress Notes (Addendum)
+   Assessment/Plan:   Theresa Moran is a very pleasant 62 y.o. year old RH female with a history of hypertension, hyperlipidemia NASH cirrhosis with hepatic encephalopathy 2023, last episode 3 months ago, anemia with recent postmenopausal bleeding, anxiety, depression, chronic LLE reflex sympathetic dystrophy (followed at the pain clinic), GERD/esophageal varices ([facility]), seen today for evaluation of memory loss. MoCA today is 23/30.  Workup is in progress.  Patient is able to participate on her IADLs and continues to drive without significant difficulties. Etiology unclear but suspect multifactorial in nature.Patient is dyslexic which is unclear if it contributes to her performance.   Memory Impairment of unclear etiology  MRI brain without contrast to assess for underlying structural abnormality and assess vascular load  Neurocognitive testing to further evaluate cognitive concerns and determine other underlying cause of memory changes, including potential contribution from sleep, anxiety, attention, or depression  EEG for transient episode of confusion For now, there is no indication for antidementia medication, will revisit once workup completed.  Continue to control mood as per PCP, she is on Buspar and nortriptyline Recommend good control of cardiovascular risk factors Monitor driving [place] up in 1  months   Subjective:   The patient is accompanied by her wife who supplements the history.   How long did patient have memory difficulties? For the last year after the hepatic encephalopathy admission in 2023.  Reports some difficulty remembering new information, conversations and names." I cannot call the word I want to say sometimes". Long-term memory is no as good as before. She likes doing crossword puzzles. repeats oneself?  Endorsed Disoriented when walking into a room?   Frequently she wonders what she came to the room for    Leaving objects in unusual places? Denies.    Wandering behavior?  Denies.  Any history of depression?:  Denies "I am quicker to anger".   Hallucinations or paranoia?  Denies.    Seizures?  Denies    Any sleep changes?   Sleeps well , reports occasional vivid dreams, REM behavior or sleepwalking  Wife says she had never been diagnosed, but she suspects she may. Any hygiene concerns?  Denies   Independent of bathing and dressing?  Endorsed  Does the patient needs help with medications? Patient is in charge   Who is in charge of the finances? Wife is in charge "always been"     Any changes in appetite?  Denies, "my appetite is fine" trying to lose some weight.    Patient have trouble swallowing? Denies.   Does the patient cook?  Not much. Any kitchen accidents such as leaving the stove on? Recently she burned the handle of the pan  Any history of headaches?  Endorsed, she had them during menstrual cycle, now on IUD due to postmenopausal bleeding Chronic pain ? Ankle pain resolved after "resetting the ankle after breaking  it 02/2021 " Ambulates with difficulty?  She does water exercises 3 times a week at the [facility]  Recent falls or head injuries? 02/2023 she had a mechanical fall. No LOC or head injury Vision changes? Denies.   Unilateral weakness, numbness or tingling? Denies.   Any tremors?  She has a history of prior LLE reflex sympathetic dystrophy, resolved after ankle surgery. She had a history of  BUE intermittent tremors  on intention, not at rest  which she attributes to same.  Any anosmia?  Denies.   Any incontinence of urine? Denies.   Any bowel dysfunction? Denies.  Patient lives with wife, 4 grandchildren.  History of heavy alcohol intake? Denies.   History of heavy tobacco use? Denies.   Family history of dementia? Denies.  Does patient drive? "Yes, had an episode of disorientation coming out of the grocery store and could not remember what to do, how to drive, after 5 min went back to normal. I knew something was  wrong".    Past Medical History:  Diagnosis Date   Allergy to environmental factors    Anxiety    Follows w/ PCP Dr. Rondel Jumbo @ [facility].   Arthritis    ankles   Bilateral lower extremity edema 2023   Chronic pain    LLE reflex sympathetic dystrophy, follws with Pain Management, Yehuda Budd, PA.   COVID-19 03/08/2021   Depression    Follows w/ PCP.   Esophageal varices (HCC)    Follows w/ Annamarie Major, NP @ [facility].   Gallstones    GERD (gastroesophageal reflux disease)    taking Protonix   Headache    stopped after menopause   Hemolytic anemia (HCC)    Follows w/ Dr. Burnice Logan Iruku @ [facility], see 06/15/22 OV in Epic, s/p iron infusions   Hepatic encephalopathy (HCC) 10/11/2021   treated w/ lactulose and rifaximim   History of pneumonia 08/2021   Hospital admission on 09/22/21.   Liver cirrhosis secondary to NASH [facility])    Follows w/ Dr. Leighton Parody @ [facility] and Annamarie Major, NP @ [facility].   Neuromuscular disorder (HCC)    reflex sympathetic dystrophy of LLE, follows w/ pain managemnt. Patient stated she broke her ankle again and nerve pain resolved.   PMB (postmenopausal bleeding) 2023   Thrombocytopenia (HCC)    Follows w/ Dr. Burnice Logan Iruku @ [facility].   Urticaria    Wears glasses      Past Surgical History:  Procedure Laterality Date   APPENDECTOMY  1979   BIOPSY  09/25/2021   Procedure: BIOPSY;  Surgeon: Benancio Deeds, MD;  Location: [facility] ENDOSCOPY;  Service: Gastroenterology;;   BREAST SURGERY  1980   breast reduction   CHOLECYSTECTOMY     as a young adult   COLONOSCOPY WITH PROPOFOL N/A 09/25/2021   Procedure: COLONOSCOPY WITH PROPOFOL;  Surgeon: Benancio Deeds, MD;  Location: [facility] ENDOSCOPY;  Service: Gastroenterology;  Laterality: N/A;   DILATATION & CURETTAGE/HYSTEROSCOPY WITH MYOSURE N/A 06/19/2022   Procedure: DILATATION & CURETTAGE/HYSTEROSCOPY WITH MYOSURE  LITE;  Surgeon: [name], MD;  Location: [facility];  Service: Gynecology;  Laterality: N/A;   DILATATION & CURETTAGE/HYSTEROSCOPY WITH MYOSURE N/A 04/05/2023   Procedure: DILATATION & CURETTAGE/HYSTEROSCOPY WITH MYOSURE LITE;  Surgeon: [name], MD;  Location: [facility];  Service: Gynecology;  Laterality: N/A;   ESOPHAGOGASTRODUODENOSCOPY N/A 09/25/2021   Procedure: ESOPHAGOGASTRODUODENOSCOPY (EGD);  Surgeon: Benancio Deeds, MD;  Location: [facility] ENDOSCOPY;  Service: Gastroenterology;  Laterality: N/A;   NASAL SEPTUM SURGERY     when patient was in her 50's   ORIF ANKLE FRACTURE Left 03/26/2021   Procedure: OPEN REDUCTION INTERNAL FIXATION (ORIF) ANKLE FRACTURE;  Surgeon: Netta Cedars, MD;  Location: [facility] OR;  Service: Orthopedics;  Laterality: Left;   POLYPECTOMY  09/25/2021   Procedure: POLYPECTOMY;  Surgeon: Benancio Deeds, MD;  Location: [facility] ENDOSCOPY;  Service: Gastroenterology;;     Allergies  Allergen Reactions   Shellfish Allergy Anaphylaxis, Hives, Itching, Shortness Of Breath and Swelling  Amitriptyline     rash   Iodine Hives    Other Reaction(s): Other (See Comments)   Shellfish-Derived Products     Current Outpatient Medications  Medication Instructions   baclofen (LIORESAL) 20 mg, Oral, 2 times daily   busPIRone (BUSPAR) 10 MG tablet TAKE 1 TABLET(10 MG) BY MOUTH TWICE DAILY   carvedilol (COREG) 3.125 MG tablet TAKE 1 TABLET(3.125 MG) BY MOUTH TWICE DAILY WITH A MEAL   cyanocobalamin (VITAMIN B12) 1,000 mcg, Daily   EPINEPHrine 0.3 mg/0.3 mL IJ SOAJ injection Inject into the muscle.   furosemide (LASIX) 20 MG tablet TAKE 1 TABLET(20 MG) BY MOUTH DAILY   lactulose (CHRONULAC) 10 GM/15ML solution 30 mLs, 3 times daily   nortriptyline (PAMELOR) 100 mg, Oral, Daily at bedtime   pantoprazole (PROTONIX) 40 MG tablet TAKE 1 TABLET(40 MG) BY MOUTH DAILY   rifaximin (XIFAXAN) 550 mg, 2 times daily   spironolactone  (ALDACTONE) 25 MG tablet TAKE 1 TABLET(25 MG) BY MOUTH DAILY     VITALS:   Vitals:   05/17/23 0942  BP: 125/69  Pulse: 78  Resp: 20  SpO2: 98%  Weight: 299 lb (135.6 kg)  Height: 5' 4.5" (1.638 m)      PHYSICAL EXAM   HEENT:  Normocephalic, atraumatic. The superficial temporal arteries are without ropiness or tenderness. Cardiovascular: Regular rate and rhythm. Lungs: Clear to auscultation bilaterally. Neck: There are no carotid bruits noted bilaterally.  NEUROLOGICAL:    05/17/2023   12:00 PM  Montreal Cognitive Assessment   Visuospatial/ Executive (0/5) 5  Naming (0/3) 3  Attention: Read list of digits (0/2) 1  Attention: Read list of letters (0/1) 1  Attention: Serial 7 subtraction starting at 100 (0/3) 3  Language: Repeat phrase (0/2) 0  Language : Fluency (0/1) 0  Abstraction (0/2) 0  Delayed Recall (0/5) 4  Orientation (0/6) 6  Total 23  Adjusted Score (based on education) 23        No data to display           Orientation:  Alert and oriented to person, place and time. No aphasia or dysarthria. Fund of knowledge is appropriate. Recent memory impaired and remote memory intact.  Attention and concentration are normal.  Able to name objects and repeat phrases. Delayed recall  4/5 Cranial nerves: There is good facial symmetry. Extraocular muscles are intact and visual fields are full to confrontational testing. Speech is fluent and clear,, slow at times. No tongue deviation. Hearing is intact to conversational tone. Tone: Tone is good throughout. Sensation: Sensation is intact to light touch. Vibration is intact at the bilateral big toe.  Coordination: The patient has no difficulty with RAM's or FNF bilaterally. Normal finger to nose  Motor: Strength is 5/5 in the bilateral upper and lower extremities. There is no pronator drift. There are no fasciculations noted. DTR's: Deep tendon reflexes are 2/4 bilaterally. Gait and Station: The patient is able to  ambulate without difficulty The patient is able to heel toe walk . Gait is cautious and narrow. The patient is able to ambulate in a tandem fashion.       Thank you for allowing [place] the opportunity to participate in the care of this nice patient. Please do not hesitate to contact us for any questions or concerns.   Total time spent on today's visit was 6 minutes dedicated to this patient today, preparing to see patient, examining the patient, ordering tests and/or medications and counseling the patient, documenting clinical  information in the EHR or other health record, independently interpreting results and communicating results to the patient/family, discussing treatment and goals, answering patient's questions and coordinating care.  Cc:  Jeani Sow, MD  Marlowe Kays 05/17/2023 12:09 PM

## 2023-05-17 ENCOUNTER — Ambulatory Visit: Payer: Medicare (Managed Care)

## 2023-05-17 ENCOUNTER — Encounter: Payer: Self-pay | Admitting: Physician Assistant

## 2023-05-17 ENCOUNTER — Ambulatory Visit: Payer: Medicare (Managed Care) | Admitting: Physician Assistant

## 2023-05-17 VITALS — BP 125/69 | HR 78 | Resp 20 | Ht 64.5 in | Wt 299.0 lb

## 2023-05-17 DIAGNOSIS — R413 Other amnesia: Secondary | ICD-10-CM

## 2023-05-17 DIAGNOSIS — R404 Transient alteration of awareness: Secondary | ICD-10-CM

## 2023-05-17 NOTE — Patient Instructions (Signed)

## 2023-06-01 ENCOUNTER — Ambulatory Visit: Payer: Medicare (Managed Care) | Admitting: Neurology

## 2023-06-01 ENCOUNTER — Encounter: Payer: Self-pay | Admitting: Physician Assistant

## 2023-06-01 ENCOUNTER — Encounter: Payer: Self-pay | Admitting: Hematology and Oncology

## 2023-06-01 DIAGNOSIS — R413 Other amnesia: Secondary | ICD-10-CM | POA: Diagnosis not present

## 2023-06-01 DIAGNOSIS — R404 Transient alteration of awareness: Secondary | ICD-10-CM

## 2023-06-01 NOTE — Progress Notes (Signed)
 EEG complete - results pending

## 2023-06-02 NOTE — Procedures (Signed)
 ELECTROENCEPHALOGRAM REPORT  Date of Study: 05/31/2022  Patient's Name: KRYSTAN NORTHROP MRN: [id number] Date of Birth: [birth year]  Referring Provider: Camie Sevin, PA-C  Clinical History: This is a 63 year old woman with memory loss, transient confusion. EEG for classification.  CNS Active Medications: Buspar , Baclofen , Nortriptyline   Technical Summary: A multichannel digital 1-hour EEG recording measured by the international 10-20 system with electrodes applied with paste and impedances below 5000 ohms performed in our laboratory with EKG monitoring in an awake and asleep patient.  Hyperventilation and photic stimulation were performed.  The digital EEG was referentially recorded, reformatted, and digitally filtered in a variety of bipolar and referential montages for optimal display.    Description: The patient is awake and asleep during the recording.  During maximal wakefulness, there is a symmetric, medium voltage 9 Hz posterior dominant rhythm that attenuates with eye opening.  The record is symmetric.  During drowsiness and sleep, there is an increase in theta slowing of the background.  Vertex waves and symmetric sleep spindles were seen. Hyperventilation and photic stimulation did not elicit any abnormalities.  There were no epileptiform discharges or electrographic seizures seen.    EKG lead was unremarkable.  Impression: This 1-hour awake and asleep EEG is normal.    Clinical Correlation: A normal EEG does not exclude a clinical diagnosis of epilepsy.  If further clinical questions remain, prolonged EEG may be helpful.  Clinical correlation is advised.   Darice Shivers, M.D.

## 2023-06-02 NOTE — Addendum Note (Signed)
 Addended by: Van Clines on: 06/02/2023 03:57 PM   Modules accepted: Orders

## 2023-06-06 ENCOUNTER — Other Ambulatory Visit: Payer: Medicare (Managed Care)

## 2023-06-11 ENCOUNTER — Encounter: Payer: Self-pay | Admitting: Physician Assistant

## 2023-06-11 DIAGNOSIS — Z30431 Encounter for routine checking of intrauterine contraceptive device: Secondary | ICD-10-CM | POA: Diagnosis not present

## 2023-06-15 ENCOUNTER — Ambulatory Visit: Payer: Medicare (Managed Care) | Admitting: Hematology and Oncology

## 2023-06-17 ENCOUNTER — Ambulatory Visit: Payer: Medicare (Managed Care) | Admitting: Physician Assistant

## 2023-06-19 ENCOUNTER — Ambulatory Visit
Admission: RE | Admit: 2023-06-19 | Discharge: 2023-06-19 | Disposition: A | Discharge status: Home / Self Care | Payer: Medicare (Managed Care) | Source: Ambulatory Visit | Attending: Physician Assistant | Admitting: Physician Assistant

## 2023-06-21 ENCOUNTER — Other Ambulatory Visit: Payer: Self-pay | Admitting: Physician Assistant

## 2023-06-21 DIAGNOSIS — M8588 Other specified disorders of bone density and structure, other site: Secondary | ICD-10-CM | POA: Diagnosis not present

## 2023-06-21 DIAGNOSIS — N958 Other specified menopausal and perimenopausal disorders: Secondary | ICD-10-CM | POA: Diagnosis not present

## 2023-06-21 MED ORDER — DIAZEPAM 2 MG PO TABS: ORAL_TABLET | ORAL | 0 refills | Status: DC

## 2023-07-06 ENCOUNTER — Ambulatory Visit: Payer: Medicare (Managed Care) | Admitting: Physician Assistant

## 2023-07-08 ENCOUNTER — Telehealth: Payer: Self-pay

## 2023-07-08 NOTE — Telephone Encounter (Signed)
Patient cancelled MRI at [facility] imaging and also cancelled office visit with Marlowe Kays, PA-C.

## 2023-08-13 ENCOUNTER — Emergency Department (HOSPITAL_COMMUNITY)
Admission: EM | Admit: 2023-08-13 | Discharge: 2023-08-13 | Disposition: A | Discharge status: Home / Self Care | Payer: Medicare (Managed Care) | Attending: Emergency Medicine | Admitting: Emergency Medicine

## 2023-08-13 ENCOUNTER — Emergency Department (HOSPITAL_COMMUNITY): Payer: Medicare (Managed Care)

## 2023-08-13 ENCOUNTER — Encounter (HOSPITAL_COMMUNITY): Payer: Self-pay

## 2023-08-13 ENCOUNTER — Other Ambulatory Visit: Payer: Self-pay

## 2023-08-13 DIAGNOSIS — R9389 Abnormal findings on diagnostic imaging of other specified body structures: Secondary | ICD-10-CM | POA: Diagnosis not present

## 2023-08-13 DIAGNOSIS — N939 Abnormal uterine and vaginal bleeding, unspecified: Secondary | ICD-10-CM | POA: Insufficient documentation

## 2023-08-13 LAB — COMPREHENSIVE METABOLIC PANEL
ALT: 39 U/L (ref 0–44)
AST: 68 U/L — ABNORMAL HIGH (ref 15–41)
Albumin: 3.4 g/dL — ABNORMAL LOW (ref 3.5–5.0)
Alkaline Phosphatase: 186 U/L — ABNORMAL HIGH (ref 38–126)
Anion gap: 7 (ref 5–15)
BUN: 13 mg/dL (ref 8–23)
CO2: 25 mmol/L (ref 22–32)
Calcium: 8.9 mg/dL (ref 8.9–10.3)
Chloride: 106 mmol/L (ref 98–111)
Creatinine, Ser: 0.78 mg/dL (ref 0.44–1.00)
GFR, Estimated: >60 mL/min (ref >60)
Glucose, Bld: 165 mg/dL — ABNORMAL HIGH (ref 70–99)
Potassium: 4 mmol/L (ref 3.5–5.1)
Sodium: 138 mmol/L (ref 135–145)
Total Bilirubin: 2.1 mg/dL — ABNORMAL HIGH (ref 0.0–1.2)
Total Protein: 7 g/dL (ref 6.5–8.1)

## 2023-08-13 LAB — CBC WITH DIFFERENTIAL/PLATELET
Abs Immature Granulocytes: 0.02 10*3/uL (ref 0.00–0.07)
Basophils Absolute: 0.1 10*3/uL (ref 0.0–0.1)
Basophils Relative: 1 %
Eosinophils Absolute: 0.2 10*3/uL (ref 0.0–0.5)
Eosinophils Relative: 4 %
HCT: 38.9 % (ref 36.0–46.0)
Hemoglobin: 12.6 g/dL (ref 12.0–15.0)
Immature Granulocytes: 0 %
Lymphocytes Relative: 25 %
Lymphs Abs: 1.6 10*3/uL (ref 0.7–4.0)
MCH: 31.7 pg (ref 26.0–34.0)
MCHC: 32.4 g/dL (ref 30.0–36.0)
MCV: 98 fL (ref 80.0–100.0)
Monocytes Absolute: 0.5 10*3/uL (ref 0.1–1.0)
Monocytes Relative: 8 %
Neutro Abs: 4 10*3/uL (ref 1.7–7.7)
Neutrophils Relative %: 62 %
Platelets: 88 10*3/uL — ABNORMAL LOW (ref 150–400)
RBC: 3.97 MIL/uL (ref 3.87–5.11)
RDW: 15.4 % (ref 11.5–15.5)
WBC: 6.4 10*3/uL (ref 4.0–10.5)
nRBC: 0 % (ref 0.0–0.2)

## 2023-08-13 LAB — TYPE AND SCREEN
ABO/RH(D): O NEG
Antibody Screen: NEGATIVE

## 2023-08-13 MED ORDER — TRANEXAMIC ACID 650 MG PO TABS: 1300.0000 mg | ORAL_TABLET | Freq: Three times a day (TID) | ORAL | 0 refills | Status: AC

## 2023-08-13 MED ORDER — TRANEXAMIC ACID 650 MG PO TABS
1300.0000 mg | ORAL_TABLET | Freq: Once | ORAL | Status: AC
  Administered 2023-08-13: 1300 mg via ORAL
  Filled 2023-08-13: qty 2

## 2023-08-13 NOTE — ED Triage Notes (Signed)
 Vaginal bleeding that started 3 days ago, pt reports she is passing baseball sized clots, 1-3 every hour and filling up 1-2 medium sized pads an hour. Denies pain

## 2023-08-13 NOTE — ED Provider Notes (Signed)
 Received patient at signout from previous provider pending [place].  See their note.  In short, patient presents to ED for evaluation of vaginal bleeding over the past 3 days.  She reports that she has been bleeding through 7-10 pads and passing large baseball size clots.  She feels a little bit weak and lightheaded. No thinners. She is followed by Dr. Imogene Burn at [facility]  In ED, patient is mildly tachycardic at most 108 bpm.  Hemoglobin 12.6 with last reading at 14.45 months ago.  BP mildly hypertensive at 150/72.  Ultrasound noted for thickened endometrium measuring 7 mm recommending endometrial sampling to exclude carcinoma.  IUD in place  Consulted Dr. Rana Snare at [facility] who reviewed ED workup and recommended TXA 1.3g TID for 5 days and outpatient f/u next week. Sent prescription to her pharmacy  Discussed ED workup, disposition, return to ED precautions with patient who expresses understanding agrees with plan.  All questions answered to their satisfaction.  They are agreeable to plan.  Discharge instructions provided on paperwork    Judithann Sheen, [place] 08/13/23 1637    Terald Sleeper, MD 08/13/23 2240

## 2023-08-13 NOTE — ED Provider Notes (Signed)
  EMERGENCY DEPARTMENT AT [facility] Provider Note   CSN: [id number] Arrival date & time: 08/13/23  1218     History  Chief Complaint  Patient presents with   Vaginal Bleeding    Theresa Moran is a 63 y.o. female, history of liver cirrhosis secondary to [place], postmenopausal bleeding, who presents to the ED secondary to vaginal bleeding for the last 3 days.  She states she has been bleeding through 7-10 pads, daily, for the last 3 days.  She states today she has bled through 7 pads, she is passing large baseball sized clots.  She feels little bit weak, and lightheaded.  Denies any shortness of breath however.  Notes that she has been seen for this, by Dr. Imogene Burn, [facility].    Home Medications Prior to Admission medications   Medication Sig Start Date End Date Taking? Authorizing Provider  baclofen (LIORESAL) 20 MG tablet Take 1 tablet (20 mg total) by mouth 2 (two) times daily. 10/20/22   Jeani Sow, MD  busPIRone (BUSPAR) 10 MG tablet TAKE 1 TABLET(10 MG) BY MOUTH TWICE DAILY 03/08/23   Jeani Sow, MD  carvedilol (COREG) 3.125 MG tablet TAKE 1 TABLET(3.125 MG) BY MOUTH TWICE DAILY WITH A MEAL 04/04/23   Worthy Rancher B, FNP  cyanocobalamin (VITAMIN B12) 1000 MCG tablet Take 1,000 mcg by mouth daily.    [provider]  diazepam (VALIUM) 2 MG tablet Take 1 tab 30 minutes prior to MRI, may add an additional tab if needed 06/21/23   Marcos Eke, PA-C  EPINEPHrine 0.3 mg/0.3 mL IJ SOAJ injection Inject into the muscle. Patient not taking: Reported on 12/14/2022    [provider]  furosemide (LASIX) 20 MG tablet TAKE 1 TABLET(20 MG) BY MOUTH DAILY 04/04/23   Worthy Rancher B, FNP  lactulose (CHRONULAC) 10 GM/15ML solution Take 30 mLs by mouth 3 (three) times daily. Takes 2 to 3 times daily.    [provider]  nortriptyline (PAMELOR) 50 MG capsule Take 2 capsules (100 mg total) by mouth at bedtime. 04/20/23    Jeani Sow, MD  pantoprazole (PROTONIX) 40 MG tablet TAKE 1 TABLET(40 MG) BY MOUTH DAILY 10/20/22   Jeani Sow, MD  rifaximin (XIFAXAN) 550 MG TABS tablet Take 550 mg by mouth in the morning and at bedtime.    [provider]  spironolactone (ALDACTONE) 25 MG tablet TAKE 1 TABLET(25 MG) BY MOUTH DAILY 04/04/23   Worthy Rancher B, FNP      Allergies    Shellfish allergy, Amitriptyline, Iodine, and Shellfish-derived products    Review of Systems   Review of Systems  Respiratory:  Negative for shortness of breath.   Genitourinary:  Positive for vaginal bleeding.    Physical Exam Updated Vital Signs BP (!) 150/72   Pulse (!) 103   Temp 97.8 F (36.6 C) (Oral)   Resp 17   Ht 5\' 4"  (1.626 m)   Wt 134.7 kg   SpO2 99%   BMI 50.98 kg/m  Physical Exam Vitals and nursing note reviewed.  Constitutional:      General: She is not in acute distress.    Appearance: She is well-developed.  HENT:     Head: Normocephalic and atraumatic.  Eyes:     Conjunctiva/sclera: Conjunctivae normal.  Cardiovascular:     Rate and Rhythm: Normal rate and regular rhythm.     Heart sounds: No murmur heard. Pulmonary:  Effort: Pulmonary effort is normal. No respiratory distress.     Breath sounds: Normal breath sounds.  Abdominal:     Palpations: Abdomen is soft.     Tenderness: There is no abdominal tenderness.  Musculoskeletal:        General: No swelling.     Cervical back: Neck supple.  Skin:    General: Skin is warm and dry.     Capillary Refill: Capillary refill takes less than 2 seconds.  Neurological:     Mental Status: She is alert.  Psychiatric:        Mood and Affect: Mood normal.     ED Results / Procedures / Treatments   Labs (all labs ordered are listed, but only abnormal results are displayed) Labs Reviewed  CBC WITH DIFFERENTIAL/PLATELET - Abnormal; Notable for the following components:      Result Value   Platelets 88 (*)    All other components  within normal limits  COMPREHENSIVE METABOLIC PANEL - Abnormal; Notable for the following components:   Glucose, Bld 165 (*)    Albumin 3.4 (*)    AST 68 (*)    Alkaline Phosphatase 186 (*)    Total Bilirubin 2.1 (*)    All other components within normal limits  TYPE AND SCREEN    EKG None  Radiology No results found.  Procedures Procedures    Medications Ordered in ED Medications - No data to display  ED Course/ Medical Decision Making/ A&P                                 Medical Decision Making Patient is a 63 year old female, here for vaginal bleeding for the last 3 days.  She has a history of this, and has cirrhosis due to NASH.  Denies any blood thinners.  Is not bleeding through 7-10 pads, a day.  Has went through 7 pads already today.  Has had a history of an endometrial biopsy, as well as a polypectomy, of her endometrium.  [facility] Im[facility]  Will obtain a transvaginal ultrasound for further evaluation as well as blood work, including hemoglobin check hemoglobin level  Amount and/or Complexity of Data Reviewed Labs: ordered.    Details: Hemoglobin 12.8 Radiology: ordered. Discussion of management or test interpretation with external provider(s): Patient does not pending, transvaginal ultrasound, at this time, plan is to consult [facility], on recommendations as this is a recurrent issue for patient.  She is not acutely anemic and does not require any blood.  Handed off to [employer], PA, for reassessment, and disposition    Final Clinical Impression(s) / ED Diagnoses Final diagnoses:  None    Rx / DC Orders ED Discharge Orders     None         Randalyn Ahmed, Theresa Alto, PA 08/13/23 1507    Gwyneth Sprout, MD 08/14/23 1746

## 2023-08-13 NOTE — Discharge Instructions (Addendum)
 Thank you for letting us evaluate you today.  Your lab workup in ED did not show any low blood counts.  However, we will be giving you TXA for vaginal bleeding for the next 5 days.  We have given you 1 dose here in emergency department so you can start your prescription tomorrow morning.  Have sent that prescription to your pharmacy.  Please make sure to follow-up with your GYN next week for further management  Return to emergency department if you experience significant worsening of symptoms, passing out episodes, significant worsening of dizziness

## 2023-08-16 DIAGNOSIS — N939 Abnormal uterine and vaginal bleeding, unspecified: Secondary | ICD-10-CM | POA: Diagnosis not present

## 2023-08-26 ENCOUNTER — Observation Stay (HOSPITAL_COMMUNITY)
Admission: EM | Admit: 2023-08-26 | Discharge: 2023-08-28 | Disposition: A | Discharge status: Home / Self Care | Payer: Medicare (Managed Care) | Attending: Internal Medicine | Admitting: Internal Medicine

## 2023-08-26 ENCOUNTER — Other Ambulatory Visit: Payer: Self-pay

## 2023-08-26 ENCOUNTER — Emergency Department (HOSPITAL_COMMUNITY): Payer: Medicare (Managed Care)

## 2023-08-26 DIAGNOSIS — E162 Hypoglycemia, unspecified: Secondary | ICD-10-CM | POA: Diagnosis not present

## 2023-08-26 DIAGNOSIS — Z79899 Other long term (current) drug therapy: Secondary | ICD-10-CM | POA: Diagnosis not present

## 2023-08-26 DIAGNOSIS — K7682 Hepatic encephalopathy: Principal | ICD-10-CM | POA: Diagnosis present

## 2023-08-26 DIAGNOSIS — N390 Urinary tract infection, site not specified: Secondary | ICD-10-CM

## 2023-08-26 DIAGNOSIS — D696 Thrombocytopenia, unspecified: Secondary | ICD-10-CM | POA: Diagnosis present

## 2023-08-26 DIAGNOSIS — Z8616 Personal history of COVID-19: Secondary | ICD-10-CM | POA: Diagnosis not present

## 2023-08-26 DIAGNOSIS — K219 Gastro-esophageal reflux disease without esophagitis: Secondary | ICD-10-CM | POA: Diagnosis not present

## 2023-08-26 DIAGNOSIS — R7989 Other specified abnormal findings of blood chemistry: Secondary | ICD-10-CM

## 2023-08-26 DIAGNOSIS — R531 Weakness: Secondary | ICD-10-CM | POA: Diagnosis not present

## 2023-08-26 DIAGNOSIS — F329 Major depressive disorder, single episode, unspecified: Secondary | ICD-10-CM | POA: Diagnosis present

## 2023-08-26 DIAGNOSIS — K7460 Unspecified cirrhosis of liver: Secondary | ICD-10-CM | POA: Diagnosis present

## 2023-08-26 DIAGNOSIS — M7989 Other specified soft tissue disorders: Secondary | ICD-10-CM | POA: Insufficient documentation

## 2023-08-26 DIAGNOSIS — G9050 Complex regional pain syndrome I, unspecified: Secondary | ICD-10-CM | POA: Insufficient documentation

## 2023-08-26 DIAGNOSIS — E161 Other hypoglycemia: Secondary | ICD-10-CM | POA: Diagnosis not present

## 2023-08-26 DIAGNOSIS — R4182 Altered mental status, unspecified: Secondary | ICD-10-CM | POA: Diagnosis not present

## 2023-08-26 DIAGNOSIS — G8929 Other chronic pain: Secondary | ICD-10-CM | POA: Diagnosis not present

## 2023-08-26 DIAGNOSIS — R410 Disorientation, unspecified: Secondary | ICD-10-CM | POA: Diagnosis not present

## 2023-08-26 DIAGNOSIS — E7229 Other disorders of urea cycle metabolism: Secondary | ICD-10-CM | POA: Diagnosis not present

## 2023-08-26 DIAGNOSIS — Z87891 Personal history of nicotine dependence: Secondary | ICD-10-CM | POA: Insufficient documentation

## 2023-08-26 DIAGNOSIS — F32A Depression, unspecified: Secondary | ICD-10-CM | POA: Diagnosis present

## 2023-08-26 DIAGNOSIS — F411 Generalized anxiety disorder: Secondary | ICD-10-CM | POA: Insufficient documentation

## 2023-08-26 DIAGNOSIS — D649 Anemia, unspecified: Secondary | ICD-10-CM | POA: Diagnosis present

## 2023-08-26 LAB — URINALYSIS, ROUTINE W REFLEX MICROSCOPIC
Bilirubin Urine: NEGATIVE
Glucose, UA: NEGATIVE mg/dL
Hgb urine dipstick: NEGATIVE
Ketones, ur: NEGATIVE mg/dL
Nitrite: NEGATIVE
Protein, ur: 30 mg/dL — AB
Specific Gravity, Urine: 1.025 (ref 1.005–1.030)
pH: 5 (ref 5.0–8.0)

## 2023-08-26 LAB — CBC
HCT: 35.2 % — ABNORMAL LOW (ref 36.0–46.0)
Hemoglobin: 11.5 g/dL — ABNORMAL LOW (ref 12.0–15.0)
MCH: 31.5 pg (ref 26.0–34.0)
MCHC: 32.7 g/dL (ref 30.0–36.0)
MCV: 96.4 fL (ref 80.0–100.0)
Platelets: 94 10*3/uL — ABNORMAL LOW (ref 150–400)
RBC: 3.65 MIL/uL — ABNORMAL LOW (ref 3.87–5.11)
RDW: 15.4 % (ref 11.5–15.5)
WBC: 6.7 10*3/uL (ref 4.0–10.5)
nRBC: 0 % (ref 0.0–0.2)

## 2023-08-26 LAB — COMPREHENSIVE METABOLIC PANEL WITH GFR
ALT: 32 U/L (ref 0–44)
AST: 57 U/L — ABNORMAL HIGH (ref 15–41)
Albumin: 3 g/dL — ABNORMAL LOW (ref 3.5–5.0)
Alkaline Phosphatase: 145 U/L — ABNORMAL HIGH (ref 38–126)
Anion gap: 5 (ref 5–15)
BUN: 15 mg/dL (ref 8–23)
CO2: 25 mmol/L (ref 22–32)
Calcium: 8.6 mg/dL — ABNORMAL LOW (ref 8.9–10.3)
Chloride: 106 mmol/L (ref 98–111)
Creatinine, Ser: 0.69 mg/dL (ref 0.44–1.00)
GFR, Estimated: >60 mL/min (ref >60)
Glucose, Bld: 90 mg/dL (ref 70–99)
Potassium: 3.6 mmol/L (ref 3.5–5.1)
Sodium: 136 mmol/L (ref 135–145)
Total Bilirubin: 1.8 mg/dL — ABNORMAL HIGH (ref 0.0–1.2)
Total Protein: 6.6 g/dL (ref 6.5–8.1)

## 2023-08-26 LAB — CBG MONITORING, ED
Glucose-Capillary: 78 mg/dL (ref 70–99)
Glucose-Capillary: 114 mg/dL — ABNORMAL HIGH (ref 70–99)

## 2023-08-26 LAB — AMMONIA: Ammonia: 113 umol/L — ABNORMAL HIGH (ref 9–35)

## 2023-08-26 MED ORDER — LACTULOSE 10 GM/15ML PO SOLN
30.0000 g | Freq: Once | ORAL | Status: AC
Start: 2023-08-26 — End: 2023-08-26
  Administered 2023-08-26: 30 g via ORAL
  Filled 2023-08-26: qty 60

## 2023-08-26 MED ORDER — SODIUM CHLORIDE 0.9 % IV SOLN
2.0000 g | INTRAVENOUS | Status: DC
  Administered 2023-08-26: 2 g via INTRAVENOUS
  Filled 2023-08-26: qty 20

## 2023-08-26 NOTE — ED Provider Notes (Signed)
 [place] EMERGENCY DEPARTMENT AT [facility] Provider Note   CSN: [id number] Arrival date & time: 08/26/23  1940     History  Chief Complaint  Patient presents with   Altered Mental Status    Theresa Moran is a 63 y.o. female.   Altered Mental Status Presenting symptoms: confusion   Associated symptoms: weakness   Patient is a 63 year old female comes the ED today complaining of a 2-day history of increased confusion which she says is similar to times when she has an elevated ammonia.  Previous medical history of Chronic pain, thrombocytopenia, liver cirrhosis, GERD, arthritis, esophageal varices, hepatic encephalopathy, hemolytic anemia.  Reports that her legs are mildly more swollen than normal.   Denies fever, headache, vision changes, unilateral weakness, chest pain, shortness of breath, abdominal pain, nausea, vomiting, diarrhea, dysuria.     Home Medications Prior to Admission medications   Medication Sig Start Date End Date Taking? Authorizing Provider  baclofen (LIORESAL) 20 MG tablet Take 1 tablet (20 mg total) by mouth 2 (two) times daily. 10/20/22   Jeani Sow, MD  busPIRone (BUSPAR) 10 MG tablet TAKE 1 TABLET(10 MG) BY MOUTH TWICE DAILY 03/08/23   Jeani Sow, MD  carvedilol (COREG) 3.125 MG tablet TAKE 1 TABLET(3.125 MG) BY MOUTH TWICE DAILY WITH A MEAL 04/04/23   Worthy Rancher B, FNP  cyanocobalamin (VITAMIN B12) 1000 MCG tablet Take 1,000 mcg by mouth daily.    [provider]  diazepam (VALIUM) 2 MG tablet Take 1 tab 30 minutes prior to MRI, may add an additional tab if needed 06/21/23   Marcos Eke, PA-C  EPINEPHrine 0.3 mg/0.3 mL IJ SOAJ injection Inject into the muscle. Patient not taking: Reported on 12/14/2022    [provider]  furosemide (LASIX) 20 MG tablet TAKE 1 TABLET(20 MG) BY MOUTH DAILY 04/04/23   Worthy Rancher B, FNP  lactulose (CHRONULAC) 10 GM/15ML solution Take 30 mLs by mouth 3 (three) times daily.  Takes 2 to 3 times daily.    [provider]  nortriptyline (PAMELOR) 50 MG capsule Take 2 capsules (100 mg total) by mouth at bedtime. 04/20/23   Jeani Sow, MD  pantoprazole (PROTONIX) 40 MG tablet TAKE 1 TABLET(40 MG) BY MOUTH DAILY 10/20/22   Jeani Sow, MD  rifaximin (XIFAXAN) 550 MG TABS tablet Take 550 mg by mouth in the morning and at bedtime.    [provider]  spironolactone (ALDACTONE) 25 MG tablet TAKE 1 TABLET(25 MG) BY MOUTH DAILY 04/04/23   Worthy Rancher B, FNP      Allergies    Shellfish allergy, Amitriptyline, Iodine, and Shellfish-derived products    Review of Systems   Review of Systems  Neurological:  Positive for weakness.  Psychiatric/Behavioral:  Positive for confusion.   All other systems reviewed and are negative.   Physical Exam Updated Vital Signs BP (!) 102/54   Pulse 99   Temp (!) 97.4 F (36.3 C) (Oral)   Resp 13   Ht 5\' 4"  (1.626 m)   Wt 136.1 kg   SpO2 98%   BMI 51.49 kg/m  Physical Exam Vitals and nursing note reviewed.  Constitutional:      General: She is not in acute distress.    Appearance: Normal appearance. She is not ill-appearing.  HENT:     Head: Normocephalic and atraumatic.  Eyes:     General: No scleral icterus.       Right eye: No discharge.  Left eye: No discharge.     Extraocular Movements: Extraocular movements intact.     Conjunctiva/sclera: Conjunctivae normal.  Cardiovascular:     Rate and Rhythm: Normal rate and regular rhythm.     Pulses: Normal pulses.     Heart sounds: Normal heart sounds. No murmur heard.    No friction rub. No gallop.  Pulmonary:     Effort: Pulmonary effort is normal. No respiratory distress.     Breath sounds: Normal breath sounds. No wheezing or rales.  Abdominal:     General: Abdomen is flat. There is no distension.     Palpations: Abdomen is soft.     Tenderness: There is abdominal tenderness (Mild suprapubic tenderness noted to palpation.). There is  no guarding.  Musculoskeletal:     Cervical back: Normal range of motion. No rigidity.  Skin:    General: Skin is warm and dry.  Neurological:     General: No focal deficit present.     Mental Status: She is alert and oriented to person, place, and time.     Cranial Nerves: No cranial nerve deficit.     Sensory: No sensory deficit.     Motor: No weakness.  Psychiatric:        Mood and Affect: Mood normal.     ED Results / Procedures / Treatments   Labs (all labs ordered are listed, but only abnormal results are displayed) Labs Reviewed  COMPREHENSIVE METABOLIC PANEL WITH GFR - Abnormal; Notable for the following components:      Result Value   Calcium 8.6 (*)    Albumin 3.0 (*)    AST 57 (*)    Alkaline Phosphatase 145 (*)    Total Bilirubin 1.8 (*)    All other components within normal limits  CBC - Abnormal; Notable for the following components:   RBC 3.65 (*)    Hemoglobin 11.5 (*)    HCT 35.2 (*)    Platelets 94 (*)    All other components within normal limits  AMMONIA - Abnormal; Notable for the following components:   Ammonia 113 (*)    All other components within normal limits  URINALYSIS, ROUTINE W REFLEX MICROSCOPIC - Abnormal; Notable for the following components:   Color, Urine AMBER (*)    APPearance CLOUDY (*)    Protein, ur 30 (*)    Leukocytes,Ua TRACE (*)    Bacteria, UA MANY (*)    All other components within normal limits  CBG MONITORING, ED - Abnormal; Notable for the following components:   Glucose-Capillary 114 (*)    All other components within normal limits  CBG MONITORING, ED    EKG None  Radiology CT Head Wo Contrast Result Date: 08/26/2023 CLINICAL DATA:  Altered mental status EXAM: CT HEAD WITHOUT CONTRAST TECHNIQUE: Contiguous axial images were obtained from the base of the skull through the vertex without intravenous contrast. RADIATION DOSE REDUCTION: This exam was performed according to the departmental dose-optimization program  which includes automated exposure control, adjustment of the mA and/or kV according to patient size and/or use of iterative reconstruction technique. COMPARISON:  None Available. FINDINGS: Brain: Normal anatomic configuration. No abnormal intra or extra-axial mass lesion or fluid collection. No abnormal mass effect or midline shift. No evidence of acute intracranial hemorrhage or infarct. Ventricular size is normal. Cerebellum unremarkable. Vascular: Unremarkable Skull: Intact Sinuses/Orbits: Paranasal sinuses are clear. Orbits are unremarkable. Other: Mastoid air cells and middle ear cavities are clear. IMPRESSION: 1. No acute intracranial abnormality.  Electronically Signed   By: Helyn Numbers M.D.   On: 08/26/2023 21:43    Procedures Procedures    Medications Ordered in ED Medications  cefTRIAXone (ROCEPHIN) 2 g in sodium chloride 0.9 % 100 mL IVPB (2 g Intravenous New Bag/Given 08/26/23 2336)  lactulose (CHRONULAC) 10 GM/15ML solution 30 g (30 g Oral Given 08/26/23 2336)    ED Course/ Medical Decision Making/ A&P Clinical Course as of 08/26/23 2349  Thu Aug 26, 2023  2253 CT Head Wo Contrast [CB]    Clinical Course User Index [CB] Lunette Stands, PA-C                                 Medical Decision Making Amount and/or Complexity of Data Reviewed Labs: ordered.   Patient is a 63 year old female comes the ED today complaining of a 2-day history of increased confusion which she says is similar to times when she has an elevated ammonia.  Also noted to have mildly increased bilateral leg swelling.  On exam, patient is noted to be in no acute distress, nontachypneic, nontachycardia, alert and orient x 4.  Patient noted to have mild ataxia being slower to respond however was answering questions appropriately.  Neuroexam was otherwise unremarkable with normal sensation, strength to both flexion and extension bilaterally with no arm drift noted.  Bilateral legs were also noted to be mildly  edematous however patient states that this is normal for her.  Has some mild suprapubic tenderness.  Discussed case with attending who believed that with patient's elevated ammonia levels and UTI with instability and confusion would benefit from being admitted.  Labs were negative Mild mild anemia of 11.5, with CMP improved from previous, noted to have UTI and elevated pneumonia. Differential diagnoses prior to evaluation: The emergent differential diagnosis includes, but is not limited to,  CVA, UTI, pyelonephritis, hepatic encephalopathy,  . This is not an exhaustive differential.   Past Medical History / Co-morbidities / Social History: Chronic pain, thrombocytopenia, liver cirrhosis secondary to non alcoholic fatty liver, GERD, arthritis, esophageal varices, hepatic encephalopathy, hemolytic anemia  Additional history: Chart reviewed. Pertinent results include:   Last seen in the ED on 08/13/2023 for vaginal bleeding x 3 days and was told to follow-up with OB/GYN with TXA given 3 times daily for 5 days.  Noted to have a endometrium measuring 7 mm with an IUD in place, needing a biopsy.   Lab Tests/Imaging studies: I personally interpreted labs/imaging and the pertinent results include: CBC notable for a anemia with a hemoglobin of 11.5 CMP is notable for increased AST of 57, increased alk phos of 145, increased bilirubin 1.8, All of which seem to be improved from previous 2 weeks ago UA notable for UTI with many bacteria noted on UA CBG notably 78 CT shows no acute intracranial abnormality I agree with the radiologist interpretation.    Medications: I ordered medication including Rocephin and lactulose.  I have reviewed the patients home medicines and have made adjustments as needed.  Disposition: After consideration of the diagnostic results and the patients response to treatment, I feel that the patient would benefit from admission, hospitalist accepted care of patient.  Dr.  Toniann Fail assumed care of this patient.   Final Clinical Impression(s) / ED Diagnoses Final diagnoses:  Disorientation  Urinary tract infection without hematuria, site unspecified  Increased ammonia level    Rx / DC Orders ED Discharge Orders  None         Lavonia Drafts 08/26/23 2350    Cathren Laine, MD 08/27/23 2015

## 2023-08-26 NOTE — ED Triage Notes (Signed)
 BIBA from home for confusion, slow to respond verbally. Per family,  this is typical when her ammonia level is high Initial glucose was 57, pt was given 12 g oral  glucose PTA and last cbg was 106. 124/76 BP 90 HR 95% room air

## 2023-08-27 ENCOUNTER — Encounter (HOSPITAL_COMMUNITY): Payer: Self-pay | Admitting: Internal Medicine

## 2023-08-27 ENCOUNTER — Observation Stay (HOSPITAL_BASED_OUTPATIENT_CLINIC_OR_DEPARTMENT_OTHER): Payer: Medicare (Managed Care)

## 2023-08-27 DIAGNOSIS — K7469 Other cirrhosis of liver: Secondary | ICD-10-CM | POA: Diagnosis not present

## 2023-08-27 DIAGNOSIS — K7682 Hepatic encephalopathy: Secondary | ICD-10-CM | POA: Diagnosis not present

## 2023-08-27 DIAGNOSIS — F411 Generalized anxiety disorder: Secondary | ICD-10-CM | POA: Insufficient documentation

## 2023-08-27 DIAGNOSIS — R609 Edema, unspecified: Secondary | ICD-10-CM

## 2023-08-27 DIAGNOSIS — K219 Gastro-esophageal reflux disease without esophagitis: Secondary | ICD-10-CM | POA: Insufficient documentation

## 2023-08-27 LAB — VITAMIN B12: Vitamin B-12: 1772 pg/mL — ABNORMAL HIGH (ref 180–914)

## 2023-08-27 LAB — HEPATIC FUNCTION PANEL
ALT: 33 U/L (ref 0–44)
AST: 62 U/L — ABNORMAL HIGH (ref 15–41)
Albumin: 3.1 g/dL — ABNORMAL LOW (ref 3.5–5.0)
Alkaline Phosphatase: 123 U/L (ref 38–126)
Bilirubin, Direct: 0.5 mg/dL — ABNORMAL HIGH (ref 0.0–0.2)
Indirect Bilirubin: 1.5 mg/dL — ABNORMAL HIGH (ref 0.3–0.9)
Total Bilirubin: 2 mg/dL — ABNORMAL HIGH (ref 0.0–1.2)
Total Protein: 6.9 g/dL (ref 6.5–8.1)

## 2023-08-27 LAB — BASIC METABOLIC PANEL WITH GFR
Anion gap: 9 (ref 5–15)
BUN: 14 mg/dL (ref 8–23)
CO2: 24 mmol/L (ref 22–32)
Calcium: 8.7 mg/dL — ABNORMAL LOW (ref 8.9–10.3)
Chloride: 104 mmol/L (ref 98–111)
Creatinine, Ser: 0.39 mg/dL — ABNORMAL LOW (ref 0.44–1.00)
GFR, Estimated: >60 mL/min (ref >60)
Glucose, Bld: 104 mg/dL — ABNORMAL HIGH (ref 70–99)
Potassium: 3.7 mmol/L (ref 3.5–5.1)
Sodium: 137 mmol/L (ref 135–145)

## 2023-08-27 LAB — URINALYSIS, W/ REFLEX TO CULTURE (INFECTION SUSPECTED)
Bilirubin Urine: NEGATIVE
Glucose, UA: NEGATIVE mg/dL
Hgb urine dipstick: NEGATIVE
Ketones, ur: NEGATIVE mg/dL
Leukocytes,Ua: NEGATIVE
Nitrite: NEGATIVE
Protein, ur: NEGATIVE mg/dL
Specific Gravity, Urine: 1.026 (ref 1.005–1.030)
pH: 5 (ref 5.0–8.0)

## 2023-08-27 LAB — CBC WITH DIFFERENTIAL/PLATELET
Abs Immature Granulocytes: 0.02 10*3/uL (ref 0.00–0.07)
Basophils Absolute: 0.1 10*3/uL (ref 0.0–0.1)
Basophils Relative: 1 %
Eosinophils Absolute: 0.1 10*3/uL (ref 0.0–0.5)
Eosinophils Relative: 2 %
HCT: 37.5 % (ref 36.0–46.0)
Hemoglobin: 12.5 g/dL (ref 12.0–15.0)
Immature Granulocytes: 0 %
Lymphocytes Relative: 26 %
Lymphs Abs: 1.7 10*3/uL (ref 0.7–4.0)
MCH: 32.1 pg (ref 26.0–34.0)
MCHC: 33.3 g/dL (ref 30.0–36.0)
MCV: 96.2 fL (ref 80.0–100.0)
Monocytes Absolute: 0.6 10*3/uL (ref 0.1–1.0)
Monocytes Relative: 8 %
Neutro Abs: 4.2 10*3/uL (ref 1.7–7.7)
Neutrophils Relative %: 63 %
Platelets: 94 10*3/uL — ABNORMAL LOW (ref 150–400)
RBC: 3.9 MIL/uL (ref 3.87–5.11)
RDW: 15.5 % (ref 11.5–15.5)
WBC: 6.7 10*3/uL (ref 4.0–10.5)
nRBC: 0 % (ref 0.0–0.2)

## 2023-08-27 LAB — IRON AND TIBC
Iron: 115 ug/dL (ref 28–170)
Saturation Ratios: 31 % (ref 10.4–31.8)
TIBC: 374 ug/dL (ref 250–450)
UIBC: 259 ug/dL

## 2023-08-27 LAB — PROTIME-INR
INR: 1.4 — ABNORMAL HIGH (ref 0.8–1.2)
Prothrombin Time: 17.7 s — ABNORMAL HIGH (ref 11.4–15.2)

## 2023-08-27 LAB — RETICULOCYTES
Immature Retic Fract: 15.5 % (ref 2.3–15.9)
RBC.: 3.88 MIL/uL (ref 3.87–5.11)
Retic Count, Absolute: 89.6 10*3/uL (ref 19.0–186.0)
Retic Ct Pct: 2.3 % (ref 0.4–3.1)

## 2023-08-27 LAB — FERRITIN: Ferritin: 30 ng/mL (ref 11–307)

## 2023-08-27 LAB — HIV ANTIBODY (ROUTINE TESTING W REFLEX): HIV Screen 4th Generation wRfx: NONREACTIVE

## 2023-08-27 LAB — FOLATE: Folate: 27.1 ng/mL (ref >5.9)

## 2023-08-27 MED ORDER — SODIUM CHLORIDE 0.9 % IV SOLN
1.0000 g | INTRAVENOUS | Status: DC
  Administered 2023-08-27 – 2023-08-28 (×2): 1 g via INTRAVENOUS
  Filled 2023-08-27 (×2): qty 10

## 2023-08-27 MED ORDER — RIFAXIMIN 550 MG PO TABS
550.0000 mg | ORAL_TABLET | Freq: Two times a day (BID) | ORAL | Status: DC
  Administered 2023-08-27 – 2023-08-28 (×3): 550 mg via ORAL
  Filled 2023-08-27 (×4): qty 1

## 2023-08-27 MED ORDER — BUSPIRONE HCL 5 MG PO TABS
10.0000 mg | ORAL_TABLET | Freq: Two times a day (BID) | ORAL | Status: DC
  Administered 2023-08-27 – 2023-08-28 (×3): 10 mg via ORAL
  Filled 2023-08-27 (×2): qty 2
  Filled 2023-08-27: qty 1

## 2023-08-27 MED ORDER — PANTOPRAZOLE SODIUM 40 MG PO TBEC
40.0000 mg | DELAYED_RELEASE_TABLET | Freq: Every day | ORAL | Status: DC
  Administered 2023-08-27 – 2023-08-28 (×2): 40 mg via ORAL
  Filled 2023-08-27 (×2): qty 1

## 2023-08-27 MED ORDER — LACTULOSE 10 GM/15ML PO SOLN
30.0000 g | Freq: Three times a day (TID) | ORAL | Status: DC
  Administered 2023-08-27 – 2023-08-28 (×4): 30 g via ORAL
  Filled 2023-08-27: qty 45
  Filled 2023-08-27: qty 60
  Filled 2023-08-27 (×2): qty 45

## 2023-08-27 MED ORDER — SPIRONOLACTONE 25 MG PO TABS
25.0000 mg | ORAL_TABLET | Freq: Every day | ORAL | Status: DC
  Administered 2023-08-27 – 2023-08-28 (×2): 25 mg via ORAL
  Filled 2023-08-27 (×2): qty 1

## 2023-08-27 MED ORDER — BACLOFEN 10 MG PO TABS
20.0000 mg | ORAL_TABLET | Freq: Two times a day (BID) | ORAL | Status: DC
  Administered 2023-08-27 – 2023-08-28 (×3): 20 mg via ORAL
  Filled 2023-08-27 (×3): qty 2

## 2023-08-27 MED ORDER — VITAMIN B-12 1000 MCG PO TABS
1000.0000 ug | ORAL_TABLET | Freq: Every day | ORAL | Status: DC
  Administered 2023-08-27 – 2023-08-28 (×2): 1000 ug via ORAL
  Filled 2023-08-27 (×2): qty 1

## 2023-08-27 MED ORDER — FUROSEMIDE 20 MG PO TABS
20.0000 mg | ORAL_TABLET | Freq: Every day | ORAL | Status: DC
  Administered 2023-08-27 – 2023-08-28 (×2): 20 mg via ORAL
  Filled 2023-08-27 (×2): qty 1

## 2023-08-27 MED ORDER — CARVEDILOL 3.125 MG PO TABS
3.1250 mg | ORAL_TABLET | Freq: Two times a day (BID) | ORAL | Status: DC
  Administered 2023-08-27 – 2023-08-28 (×3): 3.125 mg via ORAL
  Filled 2023-08-27 (×3): qty 1

## 2023-08-27 MED ORDER — NORTRIPTYLINE HCL 25 MG PO CAPS
100.0000 mg | ORAL_CAPSULE | Freq: Every day | ORAL | Status: DC
  Administered 2023-08-27: 100 mg via ORAL
  Filled 2023-08-27: qty 4

## 2023-08-27 NOTE — Hospital Course (Addendum)
 63 y.o. 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  Denies any fever or chills.  Recently has had vaginal bleeding for which OB/GYN has been following and was placed on TXA and her vaginal bleeding has improved.  Patient states she has been compliant with her lactulose and Xifaxan. Patient has managed with her lactulose and rifaximin.  Mental status at this time has improved ED Course: In the ER patient was afebrile.  CT head unremarkable.  Appears nonfocal.  Labs show hemoglobin of 11.5 creatinine 0.6 and ammonia level of 113.  UA is showing some trace leukocyte esterase with many bacteria WBC and also some squamous cell.  Patient was given lactulose p.o. and also was placed on ceftriaxone admitted for hepatic encephalopathy Patient feels improved and feels ready for discharge home now   Subjective: Seen this am Overnight afebrile BP stable on room air No asterixis, no confusion. Doing well Today is her birthday-eger to go home Labs overall stable with chronic anemia and thrombocytopenia.    Discharge diagnoses: Acute hepatic encephalopathy Liver cirrhosis due to NASH Leg swelling: Ammonia level elevated and patient also confused afebrile on admission.  Mental status improved.  Continue lactulose 3 times daily, rifaximin.  Continue her Aldactone Lasix Coreg.  Gave additional IV Lasix prior to discharge No DVT on duplex of legs.  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  History of anxiety: Cont BuSpar.  GERD:  Cont Protonix

## 2023-08-27 NOTE — Plan of Care (Signed)
 Patient is alert and oriented X4, ambulating with a cane to the bathroom. Assessment completed, denies any pain atm. Admission questions completed, will continue to monitor pt. VSS, bed at lowest position and bed alarm applied. Patient is a fall risk d/t multiple falls in the past and will call before getting out of bed.   Problem: Education: Goal: Knowledge of General Education information will improve Description: Including pain rating scale, medication(s)/side effects and non-pharmacologic comfort measures Outcome: Progressing   Problem: Health Behavior/Discharge Planning: Goal: Ability to manage health-related needs will improve Outcome: Progressing   Problem: Clinical Measurements: Goal: Ability to maintain clinical measurements within normal limits will improve Outcome: Progressing Goal: Will remain free from infection Outcome: Progressing Goal: Diagnostic test results will improve Outcome: Progressing Goal: Respiratory complications will improve Outcome: Progressing Goal: Cardiovascular complication will be avoided Outcome: Progressing   Problem: Activity: Goal: Risk for activity intolerance will decrease Outcome: Progressing   Problem: Nutrition: Goal: Adequate nutrition will be maintained Outcome: Progressing   Problem: Coping: Goal: Level of anxiety will decrease Outcome: Progressing   Problem: Elimination: Goal: Will not experience complications related to bowel motility Outcome: Progressing Goal: Will not experience complications related to urinary retention Outcome: Progressing   Problem: Pain Managment: Goal: General experience of comfort will improve and/or be controlled Outcome: Progressing   Problem: Safety: Goal: Ability to remain free from injury will improve Outcome: Progressing   Problem: Skin Integrity: Goal: Risk for impaired skin integrity will decrease Outcome: Progressing

## 2023-08-27 NOTE — ED Notes (Signed)
 Pt ambulated to and from BR w/o need of assistance.

## 2023-08-27 NOTE — Progress Notes (Signed)
   08/27/23 1417  TOC Brief Assessment  Insurance and Status Reviewed  Patient has primary care physician Yes Sharyn Lull, Marlane Mingle, MD)  Home environment has been reviewed Home with spouse  Prior level of function: Independent  Prior/Current Home Services No current home services  Social Drivers of Health Review SDOH reviewed no interventions necessary  Readmission risk has been reviewed Yes  Transition of care needs no transition of care needs at this time

## 2023-08-27 NOTE — H&P (Signed)
 History and Physical    Theresa Moran:[id number] DOB: December 31, 1960 DOA: 08/26/2023  Patient coming from: Home.  Chief Complaint: Confusion.  HPI: Theresa Moran is a 63 y.o. female with history of liver cirrhosis secondary to [facility], chronic thrombocytopenia, chronic pain secondary to reflex sympathetic dystrophy, anxiety, thrombocytopenia was brought to the ER after patient's family noticed that patient has been getting increasingly confused over the last 3 days.  Denies any fever or chills.  Recently has had vaginal bleeding for which OB/GYN has been following and was placed on TXA and her vaginal bleeding has improved.  Patient states she has been compliant with her lactulose and Xifaxan.  ED Course: In the ER patient was afebrile.  CT head unremarkable.  Appears nonfocal.  Labs show hemoglobin of 11.5 creatinine 0.6 and ammonia level of 113.  UA is showing some trace leukocyte esterase with many bacteria WBC and also some squamous cell.  Patient was given lactulose p.o. and also was placed on ceftriaxone admitted for hepatic encephalopathy.  Review of Systems: As per HPI, rest all negative.   Past Medical History:  Diagnosis Date   Allergy to environmental factors    Anxiety    Follows w/ PCP Dr. Rondel Jumbo @ [facility].   Arthritis    ankles   Bilateral lower extremity edema 2023   Chronic pain    LLE reflex sympathetic dystrophy, follws with Pain Management, Yehuda Budd, PA.   COVID-19 03/08/2021   Depression    Follows w/ PCP.   Esophageal varices (HCC)    Follows w/ Annamarie Major, NP @ [facility].   Gallstones    GERD (gastroesophageal reflux disease)    taking Protonix   Headache    stopped after menopause   Hemolytic anemia (HCC)    Follows w/ Dr. Burnice Logan Iruku @ [facility], see 06/15/22 OV in Epic, s/p iron infusions   Hepatic encephalopathy (HCC) 10/11/2021   treated w/ lactulose and rifaximim   History of pneumonia 08/2021   Hospital  admission on 09/22/21.   Liver cirrhosis secondary to NASH [facility])    Follows w/ Dr. Leighton Parody @ [facility] and Annamarie Major, NP @ [facility].   Neuromuscular disorder (HCC)    reflex sympathetic dystrophy of LLE, follows w/ pain managemnt. Patient stated she broke her ankle again and nerve pain resolved.   PMB (postmenopausal bleeding) 2023   Thrombocytopenia (HCC)    Follows w/ Dr. Burnice Logan Iruku @ [facility].   Urticaria    Wears glasses     Past Surgical History:  Procedure Laterality Date   APPENDECTOMY  1979   BIOPSY  09/25/2021   Procedure: BIOPSY;  Surgeon: Benancio Deeds, MD;  Location: [facility] ENDOSCOPY;  Service: Gastroenterology;;   BREAST SURGERY  1980   breast reduction   CHOLECYSTECTOMY     as a young adult   COLONOSCOPY WITH PROPOFOL N/A 09/25/2021   Procedure: COLONOSCOPY WITH PROPOFOL;  Surgeon: Benancio Deeds, MD;  Location: [facility] ENDOSCOPY;  Service: Gastroenterology;  Laterality: N/A;   DILATATION & CURETTAGE/HYSTEROSCOPY WITH MYOSURE N/A 06/19/2022   Procedure: DILATATION & CURETTAGE/HYSTEROSCOPY WITH MYOSURE LITE;  Surgeon: [name], MD;  Location: [facility];  Service: Gynecology;  Laterality: N/A;   DILATATION & CURETTAGE/HYSTEROSCOPY WITH MYOSURE N/A 04/05/2023   Procedure: DILATATION & CURETTAGE/HYSTEROSCOPY WITH MYOSURE LITE;  Surgeon: [name], MD;  Location: [facility];  Service: Gynecology;  Laterality:  N/A;   ESOPHAGOGASTRODUODENOSCOPY N/A 09/25/2021   Procedure: ESOPHAGOGASTRODUODENOSCOPY (EGD);  Surgeon: Benancio Deeds, MD;  Location: [facility] ENDOSCOPY;  Service: Gastroenterology;  Laterality: N/A;   NASAL SEPTUM SURGERY     when patient was in her 76's   ORIF ANKLE FRACTURE Left 03/26/2021   Procedure: OPEN REDUCTION INTERNAL FIXATION (ORIF) ANKLE FRACTURE;  Surgeon: Netta Cedars, MD;  Location: [facility] OR;  Service: Orthopedics;  Laterality: Left;   POLYPECTOMY   09/25/2021   Procedure: POLYPECTOMY;  Surgeon: Benancio Deeds, MD;  Location: [facility] ENDOSCOPY;  Service: Gastroenterology;;     reports that she quit smoking about 19 years ago. Her smoking use included cigarettes. She started smoking about 34 years ago. She has a 15 pack-year smoking history. She has never used smokeless tobacco. She reports that she does not currently use alcohol. She reports that she does not use drugs.  Allergies  Allergen Reactions   Shellfish Allergy Anaphylaxis, Hives, Itching, Shortness Of Breath and Swelling   Amitriptyline     rash   Iodine Hives    Other Reaction(s): Other (See Comments)   Shellfish-Derived Products     Family History  Problem Relation Age of Onset   Intellectual disability Mother    Hypertension Mother    Hyperlipidemia Mother    Depression Mother    Arthritis Mother    Mental illness Mother    Heart disease Father    Diabetes Father    Stroke Father    Heart disease Brother    COPD Brother    Mental illness Daughter    Learning disabilities Daughter    Diabetes Daughter    Allergic rhinitis Neg Hx    Angioedema Neg Hx    Asthma Neg Hx    Eczema Neg Hx    Immunodeficiency Neg Hx    Urticaria Neg Hx    Colon cancer Neg Hx    Pancreatic cancer Neg Hx    Liver cancer Neg Hx    Stomach cancer Neg Hx     Prior to Admission medications   Medication Sig Start Date End Date Taking? Authorizing Provider  baclofen (LIORESAL) 20 MG tablet Take 1 tablet (20 mg total) by mouth 2 (two) times daily. 10/20/22  Yes Jeani Sow, MD  busPIRone (BUSPAR) 10 MG tablet TAKE 1 TABLET(10 MG) BY MOUTH TWICE DAILY 03/08/23  Yes Jeani Sow, MD  CALCIUM CITRATE PO Take 1 tablet by mouth at bedtime.   Yes [provider]  carvedilol (COREG) 3.125 MG tablet TAKE 1 TABLET(3.125 MG) BY MOUTH TWICE DAILY WITH A MEAL 04/04/23  Yes Worthy Rancher B, FNP  cyanocobalamin (VITAMIN B12) 1000 MCG tablet Take 1,000 mcg by mouth daily.   Yes  [provider]  furosemide (LASIX) 20 MG tablet TAKE 1 TABLET(20 MG) BY MOUTH DAILY 04/04/23  Yes Worthy Rancher B, FNP  lactulose (CHRONULAC) 10 GM/15ML solution Take 30 mLs by mouth 3 (three) times daily. Takes 2 to 3 times daily.   Yes [provider]  nortriptyline (PAMELOR) 50 MG capsule Take 2 capsules (100 mg total) by mouth at bedtime. 04/20/23  Yes Jeani Sow, MD  pantoprazole (PROTONIX) 40 MG tablet TAKE 1 TABLET(40 MG) BY MOUTH DAILY 10/20/22  Yes Jeani Sow, MD  rifaximin (XIFAXAN) 550 MG TABS tablet Take 550 mg by mouth in the morning and at bedtime.   Yes [provider]  spironolactone (ALDACTONE) 25 MG tablet TAKE 1 TABLET(25 MG) BY MOUTH DAILY 04/04/23  Yes Worthy Rancher B, FNP  tranexamic acid (LYSTEDA) 650 MG TABS tablet Take 1,300 mg by mouth 3 (three) times daily as needed. 04/05/23  Yes [provider]  VITAMIN D PO Take 1 tablet by mouth at bedtime.   Yes [provider]  diazepam (VALIUM) 2 MG tablet Take 1 tab 30 minutes prior to MRI, may add an additional tab if needed Patient not taking: Reported on 08/27/2023 06/21/23   Marcos Eke, PA-C  EPINEPHrine 0.3 mg/0.3 mL IJ SOAJ injection Inject into the muscle. Patient not taking: Reported on 12/14/2022    [provider]    Physical Exam: Constitutional: Moderately built and nourished. Vitals:   08/26/23 2100 08/26/23 2115 08/26/23 2331 08/26/23 2332  BP: 116/61 (!) 104/58  (!) 102/54  Pulse: 92 91  99  Resp: 11 13  13   Temp:    (!) 97.4 F (36.3 C)  TempSrc:    Oral  SpO2: 95% 95%  98%  Weight:   136.1 kg   Height:   5\' 4"  (1.626 m)    Eyes: Anicteric no pallor. ENMT: No discharge from the ears eyes nose or mouth. Neck: No mass felt.  No neck rigidity. Respiratory: No rhonchi or crepitations. Cardiovascular: S1-S2 heard. Abdomen: Soft nontender bowel sound present. Musculoskeletal: Bilateral lower extremity edema present. Skin: No  rash. Neurologic: Alert awake oriented time place and person.  Moves all extremities. Psychiatric: Appears normal.  Normal affect.   Labs on Admission: I have personally reviewed following labs and imaging studies  CBC: Recent Labs  Lab 08/26/23 1944  WBC 6.7  HGB 11.5*  HCT 35.2*  MCV 96.4  PLT 94*   Basic Metabolic Panel: Recent Labs  Lab 08/26/23 1944  NA 136  K 3.6  CL 106  CO2 25  GLUCOSE 90  BUN 15  CREATININE 0.69  CALCIUM 8.6*   GFR: Estimated Creatinine Clearance: 97.9 mL/min (by C-G formula based on SCr of 0.69 mg/dL). Liver Function Tests: Recent Labs  Lab 08/26/23 1944  AST 57*  ALT 32  ALKPHOS 145*  BILITOT 1.8*  PROT 6.6  ALBUMIN 3.0*   No results for input(s): "LIPASE", "AMYLASE" in the last 168 hours. Recent Labs  Lab 08/26/23 1944  AMMONIA 113*   Coagulation Profile: No results for input(s): "INR", "PROTIME" in the last 168 hours. Cardiac Enzymes: No results for input(s): "CKTOTAL", "CKMB", "CKMBINDEX", "TROPONINI" in the last 168 hours. BNP (last 3 results) No results for input(s): "PROBNP" in the last 8760 hours. HbA1C: No results for input(s): "HGBA1C" in the last 72 hours. CBG: Recent Labs  Lab 08/26/23 2009 08/26/23 2310  GLUCAP 78 114*   Lipid Profile: No results for input(s): "CHOL", "HDL", "LDLCALC", "TRIG", "CHOLHDL", "LDLDIRECT" in the last 72 hours. Thyroid Function Tests: No results for input(s): "TSH", "T4TOTAL", "FREET4", "T3FREE", "THYROIDAB" in the last 72 hours. Anemia Panel: No results for input(s): "VITAMINB12", "FOLATE", "FERRITIN", "TIBC", "IRON", "RETICCTPCT" in the last 72 hours. Urine analysis:    Component Value Date/Time   COLORURINE AMBER (A) 08/26/2023 2207   APPEARANCEUR CLOUDY (A) 08/26/2023 2207   LABSPEC 1.025 08/26/2023 2207   PHURINE 5.0 08/26/2023 2207   GLUCOSEU NEGATIVE 08/26/2023 2207   HGBUR NEGATIVE 08/26/2023 2207   BILIRUBINUR NEGATIVE 08/26/2023 2207   KETONESUR NEGATIVE  08/26/2023 2207   PROTEINUR 30 (A) 08/26/2023 2207   NITRITE NEGATIVE 08/26/2023 2207   LEUKOCYTESUR TRACE (A) 08/26/2023 2207   Sepsis Labs: @LABRCNTIP (procalcitonin:4,lacticidven:4) )No results found for this or any  previous visit (from the past 240 hours).   Radiological Exams on Admission: CT Head Wo Contrast Result Date: 08/26/2023 CLINICAL DATA:  Altered mental status EXAM: CT HEAD WITHOUT CONTRAST TECHNIQUE: Contiguous axial images were obtained from the base of the skull through the vertex without intravenous contrast. RADIATION DOSE REDUCTION: This exam was performed according to the departmental dose-optimization program which includes automated exposure control, adjustment of the mA and/or kV according to patient size and/or use of iterative reconstruction technique. COMPARISON:  None Available. FINDINGS: Brain: Normal anatomic configuration. No abnormal intra or extra-axial mass lesion or fluid collection. No abnormal mass effect or midline shift. No evidence of acute intracranial hemorrhage or infarct. Ventricular size is normal. Cerebellum unremarkable. Vascular: Unremarkable Skull: Intact Sinuses/Orbits: Paranasal sinuses are clear. Orbits are unremarkable. Other: Mastoid air cells and middle ear cavities are clear. IMPRESSION: 1. No acute intracranial abnormality. Electronically Signed   By: Helyn Numbers M.D.   On: 08/26/2023 21:43      Assessment/Plan Principal Problem:   Hepatic encephalopathy (HCC) Active Problems:   Absolute anemia   Thrombocytopenia (HCC)   Cirrhosis of liver (HCC)    Hepatic encephalopathy -    patient's symptoms are concerning for hepatic encephalopathy.  At presentation ammonia levels were increased patient was confused.  Patient is afebrile.  Lactulose 30 g was given will continue with 30 g p.o. 3 times daily along with Xifaxan. Possible UTI on ceftriaxone.  Follow urine cultures. Liver cirrhosis secondary to NASH on spironolactone and Lasix  carvedilol.  Patient states her lower extremity edema  on the right side has worsened will check Dopplers.  Check PT/INR to calculate MELD score.  Patient follows with transplant hepatology. Chronic anemia with history of nonimmune hemolytic anemia.  Was previously followed by hematologist.  Follow CBC.  Check anemia panel.  Patient is on B12 supplements. Chronic thrombocytopenia most likely from liver cirrhosis. Chronic pain secondary to reflux sympathetic dystrophy on baclofen and amitriptyline. History of anxiety on BuSpar. GERD on Protonix.  Since patient has hepatic encephalopathy will need close monitoring and more than 2 midnight stay.   DVT prophylaxis: SCDs. Code Status: Full code. Family Communication: Discussed with patient. Disposition Plan: Monitored bed. Consults called: None. Admission status: Observation.

## 2023-08-27 NOTE — Progress Notes (Signed)
 Patient seen and examined personally, I reviewed the chart, history and physical and admission note, done by admitting physician this morning and agree with the same with following addendum.  Please refer to the morning admission note for more detailed plan of care.  Briefly,   63 y.o. female with history of liver cirrhosis secondary to [facility], chronic thrombocytopenia, chronic pain secondary to reflex sympathetic dystrophy, anxiety, thrombocytopenia was brought to the ER after patient's family noticed that patient has been getting increasingly confused over the last 3 days.  Denies any fever or chills.  Recently has had vaginal bleeding for which OB/GYN has been following and was placed on TXA and her vaginal bleeding has improved.  Patient states she has been compliant with her lactulose and Xifaxan.   ED Course: In the ER patient was afebrile.  CT head unremarkable.  Appears nonfocal.  Labs show hemoglobin of 11.5 creatinine 0.6 and ammonia level of 113.  UA is showing some trace leukocyte esterase with many bacteria WBC and also some squamous cell.  Patient was given lactulose p.o. and also was placed on ceftriaxone admitted for hepatic encephalopathy  Subjective: Seen this am More alert and oriented now Says she had her hepatic encephalopathy again Overnight afebrile heart rate in low 100, BP stable Labs reviewed this morning elevated TB anemia panel okay B12 high CBC with chronic thrombocytopenia, UA unremarkable Lower extremity negative for DVT bilaterally.  On exam Aaox3 Asterixis + Leg edema+ CTA lung  Assessment and plan:  Acute hepatic encephalopathy Liver cirrhosis due to NASH Leg swelling: Ammonia level elevated and patient also confused afebrile on admission.  Continue lactulose 3 times daily, rifaximin.  Continue liver cirrhosis meds including Aldactone Lasix Coreg.  No DVT on duplex.  Possible UTI: Cont ceftriaxone.  Follow urine cultures.  Chronic anemia with history of  nonimmune hemolytic anemia: Was previously followed by hematologist.  Follow CBC.  Anemia panel unremarkable   Chronic thrombocytopenia: In the setting of liver cirrhosis monitor platelet count   Chronic pain secondary to reflux sympathetic dystrophy: Cont baclofen and amitriptyline.  History of anxiety: Cont BuSpar.  GERD:  Cont Protonix

## 2023-08-27 NOTE — ED Notes (Signed)
 Pt ambulated to and from BR with assistance of pt cane.

## 2023-08-27 NOTE — Progress Notes (Signed)
 BLE venous duplex has been completed.   Results can be found under chart review under CV PROC. 08/27/2023 10:06 AM Taim Wurm RVT, RDMS

## 2023-08-27 NOTE — Care Management Obs Status (Signed)
 MEDICARE OBSERVATION STATUS NOTIFICATION   Patient Details  Name: Theresa Moran MRN: [id number] Date of Birth: 12-Sep-1960   Medicare Observation Status Notification Given:  Yes    Beckie Busing, RN 08/27/2023, 3:37 PM

## 2023-08-28 ENCOUNTER — Other Ambulatory Visit (HOSPITAL_COMMUNITY): Payer: Self-pay

## 2023-08-28 ENCOUNTER — Encounter: Payer: Self-pay | Admitting: Hematology and Oncology

## 2023-08-28 DIAGNOSIS — K7682 Hepatic encephalopathy: Secondary | ICD-10-CM | POA: Diagnosis not present

## 2023-08-28 LAB — CBC
HCT: 35.9 % — ABNORMAL LOW (ref 36.0–46.0)
Hemoglobin: 11.5 g/dL — ABNORMAL LOW (ref 12.0–15.0)
MCH: 31.9 pg (ref 26.0–34.0)
MCHC: 32 g/dL (ref 30.0–36.0)
MCV: 99.4 fL (ref 80.0–100.0)
Platelets: 90 10*3/uL — ABNORMAL LOW (ref 150–400)
RBC: 3.61 MIL/uL — ABNORMAL LOW (ref 3.87–5.11)
RDW: 15.8 % — ABNORMAL HIGH (ref 11.5–15.5)
WBC: 6 10*3/uL (ref 4.0–10.5)
nRBC: 0 % (ref 0.0–0.2)

## 2023-08-28 LAB — BASIC METABOLIC PANEL WITH GFR
Anion gap: 8 (ref 5–15)
BUN: 14 mg/dL (ref 8–23)
CO2: 24 mmol/L (ref 22–32)
Calcium: 8.8 mg/dL — ABNORMAL LOW (ref 8.9–10.3)
Chloride: 107 mmol/L (ref 98–111)
Creatinine, Ser: 0.78 mg/dL (ref 0.44–1.00)
GFR, Estimated: >60 mL/min (ref >60)
Glucose, Bld: 114 mg/dL — ABNORMAL HIGH (ref 70–99)
Potassium: 3.8 mmol/L (ref 3.5–5.1)
Sodium: 139 mmol/L (ref 135–145)

## 2023-08-28 LAB — MAGNESIUM: Magnesium: 2.1 mg/dL (ref 1.7–2.4)

## 2023-08-28 LAB — AMMONIA: Ammonia: 30 umol/L (ref 9–35)

## 2023-08-28 MED ORDER — CEFADROXIL 500 MG PO CAPS
500.0000 mg | ORAL_CAPSULE | Freq: Two times a day (BID) | ORAL | 0 refills | Status: AC
  Filled 2023-08-28: qty 8, 4d supply, fill #0

## 2023-08-28 MED ORDER — SODIUM CHLORIDE 0.9 % IV BOLUS
500.0000 mL | Freq: Once | INTRAVENOUS | Status: AC
  Administered 2023-08-28: 500 mL via INTRAVENOUS

## 2023-08-28 MED ORDER — FUROSEMIDE 10 MG/ML IJ SOLN
20.0000 mg | Freq: Once | INTRAMUSCULAR | Status: AC
  Administered 2023-08-28: 20 mg via INTRAVENOUS
  Filled 2023-08-28: qty 2

## 2023-08-28 NOTE — Evaluation (Signed)
 Physical Therapy Evaluation Patient Details Name: Theresa Moran MRN: [id number] DOB: 07/07/1960 Today's Date: 08/28/2023  History of Present Illness  63 y.o. female who was brought to the ER on 08/26/23 after patient's family noticed that patient has been getting increasingly confused over the last 3 days. 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 Pt with history of liver cirrhosis secondary to [facility], chronic thrombocytopenia, chronic pain secondary to reflex sympathetic dystrophy, anxiety, thrombocytopenia, neuropathy.  Clinical Impression  Pt admitted as above and presenting with mod IND with all basic mobility tasks.  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  Pt eager for dc home this date with family assist.        If plan is discharge home, recommend the following:     Can travel by private vehicle        Equipment Recommendations None recommended by PT  Recommendations for Other Services       Functional Status Assessment Patient has not had a recent decline in their functional status     Precautions / Restrictions Precautions Precautions: None Restrictions Weight Bearing Restrictions Per Provider Order: No      Mobility  Bed Mobility Overal bed mobility: Modified Independent                  Transfers Overall transfer level: Modified independent Equipment used: Straight cane                    Ambulation/Gait Ambulation/Gait assistance: Modified independent (Device/Increase time) Gait Distance (Feet): 200 Feet Assistive device: Straight cane Gait Pattern/deviations: Step-through pattern, Decreased step length - right, Decreased step length - left, Shuffle, Wide base of support Gait velocity: decr     General Gait Details: Increased BOS, rocking gait but good safety awareness and no LOB  Stairs            Wheelchair Mobility     Tilt Bed    Modified Rankin (Stroke  Patients Only)       Balance Overall balance assessment: Mild deficits observed, not formally tested                                           Pertinent Vitals/Pain Pain Assessment Pain Assessment: 0-10 Pain Score: 2  Pain Location: Liver Pain Intervention(s): Limited activity within patient's tolerance, Monitored during session    Home Living Family/patient expects to be discharged to:: Private residence Living Arrangements: Spouse/significant other Available Help at Discharge: Family;Available 24 hours/day Type of Home: House Home Access: Stairs to enter Entrance Stairs-Rails: None Entrance Stairs-Number of Steps: 1 + 1 Alternate Level Stairs-Number of Steps: stair lift for basemet laundry room. 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     Extremity/Trunk Assessment   Upper Extremity Assessment Upper Extremity Assessment: Right hand dominant    Lower Extremity Assessment Lower Extremity Assessment: Overall WFL for tasks assessed    Cervical / Trunk Assessment Cervical / Trunk Assessment: Other exceptions Cervical / Trunk  Exceptions: On disability. Morbid obesity.  Communication   Communication Communication: No apparent difficulties    Cognition Arousal: Alert Behavior During Therapy: WFL for tasks assessed/performed   PT - Cognitive impairments: No apparent impairments                         Following commands: Intact       Cueing       General Comments General comments (skin integrity, edema, etc.): Pt admits that in the past, she has  not fully cleared her feet when ambulating which has led to past falls. Pt stated that she has now corrected this and stays more aware of foot clearance while compensating with wife BOS.    Exercises     Assessment/Plan    PT Assessment Patient does not need any further PT services  PT Problem List         PT Treatment Interventions      PT Goals (Current goals can be found in the Care Plan section)  Acute Rehab PT Goals Patient Stated Goal: HOME PT Goal Formulation: All assessment and education complete, DC therapy    Frequency       Co-evaluation PT/OT/SLP Co-Evaluation/Treatment: Yes Reason for Co-Treatment: To address functional/ADL transfers PT goals addressed during session: Mobility/safety with mobility OT goals addressed during session: ADL's and self-care       AM-PAC PT "6 Clicks" Mobility  Outcome Measure Help needed turning from your back to your side while in a flat bed without using bedrails?: None Help needed moving from lying on your back to sitting on the side of a flat bed without using bedrails?: None Help needed moving to and from a bed to a chair (including a wheelchair)?: None Help needed standing up from a chair using your arms (e.g., wheelchair or bedside chair)?: None Help needed to walk in hospital room?: None Help needed climbing 3-5 steps with a railing? : A Little 6 Click Score: 23    End of Session   Activity Tolerance: Patient tolerated treatment well Patient left: in chair;with call bell/phone within reach Nurse Communication: Mobility status      Time: 1135-1150 PT Time Calculation (min) (ACUTE ONLY): 15 min   Charges:   PT Evaluation $PT Eval Low Complexity: 1 Low   PT General Charges $$ ACUTE PT VISIT: 1 Visit         Mauro Kaufmann PT Acute Rehabilitation Services Pager [phone] Office [phone]   Danilo Cappiello 08/28/2023, 4:38 PM

## 2023-08-28 NOTE — Plan of Care (Signed)

## 2023-08-28 NOTE — Evaluation (Signed)
 Occupational Therapy Evaluation Patient Details Name: Theresa Moran MRN: [id number] DOB: 1961-01-30 Today's Date: 08/28/2023   History of Present Illness   63 y.o. female who was brought to the ER on 08/26/23 after patient's family noticed that patient has been getting increasingly confused over the last 3 days. 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 Pt with history of liver cirrhosis secondary to [facility], chronic thrombocytopenia, chronic pain secondary to reflex sympathetic dystrophy, anxiety, thrombocytopenia, neuropathy.     Clinical Impressions Patient evaluated by Occupational Therapy with no further acute OT needs identified. All education has been completed and the patient has no further questions.  See below for any follow-up Occupational Therapy or equipment needs. OT is signing off. Thank you for this referral.      If plan is discharge home, recommend the following:   Assistance with cooking/housework;Assist for transportation     Functional Status Assessment   Patient has not had a recent decline in their functional status     Equipment Recommendations   None recommended by OT     Recommendations for Other Services         Precautions/Restrictions   Precautions Precautions: None     Mobility Bed Mobility Overal bed mobility: Modified Independent (HOB elevated.)                  Transfers Overall transfer level: Modified independent Equipment used: Straight cane                      Balance Overall balance assessment: Mild deficits observed, not formally tested                                         ADL either performed or assessed with clinical judgement   ADL Overall ADL's : At baseline                                       General ADL Comments: Pt able to demonstrate functional mobility with her [place] and no assistance, LE dressing at EOB, and functional UE use for all ADLs. Pt  agrees that she is at baseline and looks forward to returning home today     Vision Baseline Vision/History: 1 Wears glasses Patient Visual Report: No change from baseline Vision Assessment?: No apparent visual deficits     Perception         Praxis         Pertinent Vitals/Pain Pain Assessment Pain Assessment: 0-10 Pain Score: 2  Pain Location: Liver Pain Intervention(s): Limited activity within patient's tolerance, Monitored during session     Extremity/Trunk Assessment Upper Extremity Assessment Upper Extremity Assessment: Right hand dominant;Overall [facility] for tasks assessed   Lower Extremity Assessment Lower Extremity Assessment: Defer to PT evaluation   Cervical / Trunk Assessment Cervical / Trunk Assessment: Other exceptions Cervical / Trunk Exceptions: On disability. Morbid obesity.   Communication Communication Communication: No apparent difficulties   Cognition Arousal: Alert Behavior During Therapy: WFL for tasks assessed/performed Cognition: No apparent impairments                               Following commands: Intact       Cueing  General Comments  Pt admits that in the past, she has not fully cleared her feet when ambulating which has led to past falls. Pt stated that she has now corrected this and stays more aware of foot clearance while compensating with wife BOS.   Exercises     Shoulder Instructions      Home Living Family/patient expects to be discharged to:: Private residence Living Arrangements: Spouse/significant other (wife and 4 grandchildren.) Available Help at Discharge: Family;Available 24 hours/day Type of Home: House Home Access: Stairs to enter [employer] of Steps: 1 + 1 Entrance Stairs-Rails: None Home Layout: Multi-level;Laundry or work area in basement Alternate [employer] of Steps: stair lift for [employer].   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    OT Problem List: Obesity;Pain;Decreased activity tolerance   OT Treatment/Interventions:        OT Goals(Current goals can be found in the care plan section)   Acute Rehab OT Goals Patient Stated Goal: Go home OT Goal Formulation: All assessment and education complete, DC therapy   OT Frequency:       Co-evaluation PT/OT/SLP Co-Evaluation/Treatment: Yes Reason for Co-Treatment: To address functional/ADL transfers PT goals addressed during session: Mobility/safety with mobility OT goals addressed during session: ADL's and self-care      AM-PAC OT "6 Clicks" Daily Activity     Outcome Measure Help from another person eating meals?: None Help from another person taking care of personal grooming?: None Help from another person toileting, which includes using toliet, bedpan, or urinal?: None Help from another person bathing (including washing, rinsing, drying)?: None Help from another person to put on and taking off regular upper body clothing?: None Help from another person to put on and taking off regular lower body clothing?: None 6 Click Score: 24   End of Session Equipment Utilized During Treatment: Other (comment) (Pt's cane) Nurse Communication: Mobility status;Other (comment) (No need for chair alarm. RN in agreement)  Activity Tolerance: Patient tolerated treatment well Patient left: in chair;with call bell/phone within reach  OT Visit Diagnosis: Pain;Other abnormalities of gait and mobility (R26.89) Pain  - part of body:  (liver)                Time: 1610-9604 OT Time Calculation (min): 13 min Charges:  OT General Charges $OT Visit: 1 Visit OT Evaluation $OT Eval Low Complexity: 1 Low  Malayla Granberry, OT Acute Rehab Services Office: [phone] 08/28/2023   Theodoro Clock 08/28/2023, 12:55 PM

## 2023-08-28 NOTE — Discharge Summary (Signed)
 Physician Discharge Summary  Theresa Moran ZOX:[id number] DOB: Apr 30, 1961 DOA: 08/26/2023  PCP: Jeani Sow, MD  Admit date: 08/26/2023 Discharge date: 08/28/2023 Recommendations for Outpatient Follow-up:  Follow up with PCP in 1 weeks-call for appointment Please obtain BMP/CBC in one week  Discharge Dispo: Home Discharge Condition: Stable Code Status:   Code Status: Full Code Diet recommendation:  Diet Order             Diet 2 gram sodium Room service appropriate? Yes; Fluid consistency: Thin  Diet effective now                    Brief/Interim Summary:  63 y.o. female with history of liver cirrhosis secondary to [facility], chronic thrombocytopenia, chronic pain secondary to reflex sympathetic dystrophy, anxiety, thrombocytopenia was brought to the ER after patient's family noticed that patient has been getting increasingly confused over the last 3 days.  Denies any fever or chills.  Recently has had vaginal bleeding for which OB/GYN has been following and was placed on TXA and her vaginal bleeding has improved.  Patient states she has been compliant with her lactulose and Xifaxan. Patient has managed with her lactulose and rifaximin.  Mental status at this time has improved ED Course: In the ER patient was afebrile.  CT head unremarkable.  Appears nonfocal.  Labs show hemoglobin of 11.5 creatinine 0.6 and ammonia level of 113.  UA is showing some trace leukocyte esterase with many bacteria WBC and also some squamous cell.  Patient was given lactulose p.o. and also was placed on ceftriaxone admitted for hepatic encephalopathy Patient feels improved and feels ready for discharge home now   Subjective: Seen this am Overnight afebrile BP stable on room air No asterixis, no confusion. Doing well Today is her birthday-eger to go home Labs overall stable with chronic anemia and thrombocytopenia.    Discharge diagnoses: Acute hepatic encephalopathy Liver cirrhosis due to NASH Leg  swelling: Ammonia level elevated and patient also confused afebrile on admission.  Mental status improved.  Continue lactulose 3 times daily, rifaximin.  Continue her Aldactone Lasix Coreg.  Gave additional IV Lasix prior to discharge No DVT on duplex of legs.  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  CBC stable.Anemia panel unremarkable   Chronic thrombocytopenia: In the setting of liver cirrhosis monitor platelet count   Chronic pain secondary to reflux sympathetic dystrophy: Cont baclofen and amitriptyline.  History of anxiety: Cont BuSpar.  GERD:  Cont Protonix    Discharge Exam: Vitals:   08/28/23 0041 08/28/23 0428  BP: (!) 103/46 (!) 101/36  Pulse: (!) 103 99  Resp: 15 16  Temp: 98.5 F (36.9 C) 98 F (36.7 C)  SpO2: 94% 92%   General: Pt is alert, awake, not in acute distress Cardiovascular: RRR, S1/S2 +, no rubs, no gallops Respiratory: CTA bilaterally, no wheezing, no rhonchi Abdominal: Soft, NT, ND, bowel sounds + Extremities: no edema, no cyanosis  Discharge Instructions  Discharge Instructions     Discharge instructions   Complete by: As directed    Please call call MD or return to ER for similar or worsening recurring problem that brought you to hospital or if any fever,nausea/vomiting,abdominal pain, uncontrolled pain, chest pain,  shortness of breath or any other alarming symptoms.  Please follow-up your doctor as instructed in a week time and call  the office for appointment.  Please avoid alcohol, smoking, or any other illicit substance and maintain healthy habits including taking your regular medications as prescribed.  You were cared for by a hospitalist during your hospital stay. If you have any questions about your discharge medications or the care you received while you were in  the hospital after you are discharged, you can call the unit and ask to speak with the hospitalist on call if the hospitalist that took care of you is not available.  Once you are discharged, your primary care physician will handle any further medical issues. Please note that NO REFILLS for any discharge medications will be authorized once you are discharged, as it is imperative that you return to your primary care physician (or establish a relationship with a primary care physician if you do not have one) for your aftercare needs so that they can reassess your need for medications and monitor your lab values   Increase activity slowly   Complete by: As directed       Allergies as of 08/28/2023       Reactions   Shellfish Allergy Anaphylaxis, Hives, Itching, Shortness Of Breath, Swelling   Amitriptyline    rash   Iodine Hives   Other Reaction(s): Other (See Comments)   Shellfish-derived Products         Medication List     TAKE these medications    baclofen 20 MG tablet Commonly known as: LIORESAL Take 1 tablet (20 mg total) by mouth 2 (two) times daily.   busPIRone 10 MG tablet Commonly known as: BUSPAR TAKE 1 TABLET(10 MG) BY MOUTH TWICE DAILY   CALCIUM CITRATE PO Take 1 tablet by mouth at bedtime.   carvedilol 3.125 MG tablet Commonly known as: COREG TAKE 1 TABLET(3.125 MG) BY MOUTH TWICE DAILY WITH A MEAL   cefadroxil 500 MG capsule Commonly known as: DURICEF Take 1 capsule (500 mg total) by mouth 2 (two) times daily for 4 days.   cyanocobalamin 1000 MCG tablet Commonly known as: VITAMIN B12 Take 1,000 mcg by mouth daily.   diazepam 2 MG tablet Commonly known as: Valium Take 1 tab 30 minutes prior to MRI, may add an additional tab if needed   EPINEPHrine 0.3 mg/0.3 mL Soaj injection Commonly known as: EPI-PEN Inject into the muscle.   furosemide 20 MG tablet Commonly known as: LASIX TAKE 1 TABLET(20 MG) BY MOUTH DAILY   lactulose 10 GM/15ML  solution Commonly known as: CHRONULAC Take 30 mLs by mouth 3 (three) times daily. Takes 2 to 3 times daily.   Lysteda 650 MG Tabs tablet Generic drug: tranexamic acid Take 1,300 mg by mouth 3 (three) times daily as needed.   nortriptyline 50 MG capsule Commonly known as: PAMELOR Take 2 capsules (100 mg total) by mouth at bedtime.   pantoprazole 40 MG tablet Commonly known as: PROTONIX TAKE 1 TABLET(40 MG) BY MOUTH DAILY   rifaximin 550 MG Tabs tablet Commonly known as: XIFAXAN Take 550 mg by mouth in the morning and at bedtime.   spironolactone 25 MG tablet Commonly known as: ALDACTONE TAKE 1 TABLET(25 MG) BY MOUTH DAILY   VITAMIN D PO Take 1 tablet by mouth at bedtime.        Follow-up Information     Jeani Sow, MD Follow up in 1 week(s).   Specialty: Family Medicine Contact information: [address] [place] [address] [phone]  Allergies  Allergen Reactions   Shellfish Allergy Anaphylaxis, Hives, Itching, Shortness Of Breath and Swelling   Amitriptyline     rash   Iodine Hives    Other Reaction(s): Other (See Comments)   Shellfish-Derived Products     The results of significant diagnostics from this hospitalization (including imaging, microbiology, ancillary and laboratory) are listed below for reference.    Microbiology: No results found for this or any previous visit (from the past 240 hours).  Procedures/Studies: VAS [place] LOWER EXTREMITY VENOUS (DVT) Result Date: 08/27/2023  Lower Venous DVT Study Patient Name:  MIKAELA HILGEMAN  Date of Exam:   08/27/2023 Medical Rec #: [id number]     Accession #:    [id number] Date of Birth: Feb 14, 1961      Patient Gender: F Patient Age:   2 years Exam [facility]:  [facility] Procedure:      VAS [place] LOWER EXTREMITY VENOUS (DVT) Referring Phys: Midge Minium --------------------------------------------------------------------------------  Performing Technologist: Ernestene Mention RVT,  RDMS  Examination Guidelines: A complete evaluation includes B-mode imaging, spectral Doppler, color Doppler, and power Doppler as needed of all accessible portions of each vessel. Bilateral testing is considered an integral part of a complete examination. Limited examinations for reoccurring indications may be performed as noted. The reflux portion of the exam is performed with the patient in reverse Trendelenburg.  +---------+---------------+---------+-----------+----------+--------------+ RIGHT    CompressibilityPhasicitySpontaneityPropertiesThrombus Aging +---------+---------------+---------+-----------+----------+--------------+ CFV      Full           Yes      Yes                                 +---------+---------------+---------+-----------+----------+--------------+ SFJ      Full                                                        +---------+---------------+---------+-----------+----------+--------------+ FV Prox  Full           Yes      Yes                                 +---------+---------------+---------+-----------+----------+--------------+ FV Mid   Full           Yes      Yes                                 +---------+---------------+---------+-----------+----------+--------------+ FV DistalFull           Yes      Yes                                 +---------+---------------+---------+-----------+----------+--------------+ PFV      Full                                                        +---------+---------------+---------+-----------+----------+--------------+ POP      Full  Yes      Yes                                 +---------+---------------+---------+-----------+----------+--------------+ PTV      Full                                                        +---------+---------------+---------+-----------+----------+--------------+ PERO     Full                                                         +---------+---------------+---------+-----------+----------+--------------+   +---------+---------------+---------+-----------+----------+--------------+ LEFT     CompressibilityPhasicitySpontaneityPropertiesThrombus Aging +---------+---------------+---------+-----------+----------+--------------+ CFV      Full           Yes      Yes                                 +---------+---------------+---------+-----------+----------+--------------+ SFJ      Full                                                        +---------+---------------+---------+-----------+----------+--------------+ FV Prox  Full           Yes      Yes                                 +---------+---------------+---------+-----------+----------+--------------+ FV Mid   Full           Yes      Yes                                 +---------+---------------+---------+-----------+----------+--------------+ FV DistalFull           Yes      Yes                                 +---------+---------------+---------+-----------+----------+--------------+ PFV      Full                                                        +---------+---------------+---------+-----------+----------+--------------+ POP      Full           Yes      Yes                                 +---------+---------------+---------+-----------+----------+--------------+ PTV      Full                                                        +---------+---------------+---------+-----------+----------+--------------+  PERO     Full                                                        +---------+---------------+---------+-----------+----------+--------------+    Summary: BILATERAL: - No evidence of deep vein thrombosis seen in the lower extremities, bilaterally. -No evidence of popliteal cyst, bilaterally.  LEFT: Subcutaneous edema in area of calf/ankle.  *See table(s) above for measurements and observations.    Preliminary     CT Head Wo Contrast Result Date: 08/26/2023 CLINICAL DATA:  Altered mental status EXAM: CT HEAD WITHOUT CONTRAST TECHNIQUE: Contiguous axial images were obtained from the base of the skull through the vertex without intravenous contrast. RADIATION DOSE REDUCTION: This exam was performed according to the departmental dose-optimization program which includes automated exposure control, adjustment of the mA and/or kV according to patient size and/or use of iterative reconstruction technique. COMPARISON:  None Available. FINDINGS: Brain: Normal anatomic configuration. No abnormal intra or extra-axial mass lesion or fluid collection. No abnormal mass effect or midline shift. No evidence of acute intracranial hemorrhage or infarct. Ventricular size is normal. Cerebellum unremarkable. Vascular: Unremarkable Skull: Intact Sinuses/Orbits: Paranasal sinuses are clear. Orbits are unremarkable. Other: Mastoid air cells and middle ear cavities are clear. IMPRESSION: 1. No acute intracranial abnormality. Electronically Signed   By: Helyn Numbers M.D.   On: 08/26/2023 21:43   US Pelvis Complete Result Date: 08/13/2023 CLINICAL DATA:  Vaginal bleeding, postmenopausal. EXAM: TRANSABDOMINAL ULTRASOUND OF PELVIS TECHNIQUE: Transabdominal ultrasound examination of the pelvis was performed including evaluation of the uterus, ovaries, adnexal regions, and pelvic cul-de-sac. COMPARISON:  CT of the pelvis 09/11/2021. FINDINGS: Uterus Measurements: 9.1 x 3.8 x 5.0 cm = volume: 91 mL. No fibroids or other mass visualized. Endometrium Thickness: 7 mm.  IUD is in place. Right ovary Not visualized Left ovary Not visualized Other findings:  No abnormal free fluid. IMPRESSION: 1. Thickened endometrium measuring 7 mm. In a postmenopausal patient with bleeding, endometrial sampling is indicated to exclude carcinoma. If results are benign, sonohysterogram should be considered for focal lesion work-up. 2. IUD in place. 3. Nonvisualization of  the ovaries. Electronically Signed   By: Marin Roberts M.D.   On: 08/13/2023 16:03    Labs: BNP (last 3 results) No results for input(s): "BNP" in the last 8760 hours. Basic Metabolic Panel: Recent Labs  Lab 08/26/23 1944 08/27/23 0451 08/28/23 0626  NA 136 137 139  K 3.6 3.7 3.8  CL 106 104 107  CO2 25 24 24   GLUCOSE 90 104* 114*  BUN 15 14 14   CREATININE 0.69 0.39* 0.78  CALCIUM 8.6* 8.7* 8.8*  MG  --   --  2.1   Liver Function Tests: Recent Labs  Lab 08/26/23 1944 08/27/23 0451  AST 57* 62*  ALT 32 33  ALKPHOS 145* 123  BILITOT 1.8* 2.0*  PROT 6.6 6.9  ALBUMIN 3.0* 3.1*   No results for input(s): "LIPASE", "AMYLASE" in the last 168 hours. Recent Labs  Lab 08/26/23 1944 08/28/23 0912  AMMONIA 113* 30   CBC: Recent Labs  Lab 08/26/23 1944 08/27/23 0451 08/28/23 0626  WBC 6.7 6.7 6.0  NEUTROABS  --  4.2  --   HGB 11.5* 12.5 11.5*  HCT 35.2* 37.5 35.9*  MCV 96.4 96.2 99.4  PLT 94* 94* 90*   Cardiac Enzymes:  No results for input(s): "CKTOTAL", "CKMB", "CKMBINDEX", "TROPONINI" in the last 168 hours. BNP: Invalid input(s): "POCBNP" CBG: Recent Labs  Lab 08/26/23 2009 08/26/23 2310  GLUCAP 78 114*   D-Dimer No results for input(s): "DDIMER" in the last 72 hours. Hgb A1c No results for input(s): "HGBA1C" in the last 72 hours. Lipid Profile No results for input(s): "CHOL", "HDL", "LDLCALC", "TRIG", "CHOLHDL", "LDLDIRECT" in the last 72 hours. Thyroid function studies No results for input(s): "TSH", "T4TOTAL", "T3FREE", "THYROIDAB" in the last 72 hours.  Invalid input(s): "FREET3" Anemia work up Recent Labs    08/27/23 0451 08/27/23 0603  VITAMINB12  --  1,772*  FOLATE  --  27.1  FERRITIN  --  30  TIBC  --  374  IRON  --  115  RETICCTPCT 2.3  --    Urinalysis    Component Value Date/Time   COLORURINE AMBER (A) 08/27/2023 0818   APPEARANCEUR HAZY (A) 08/27/2023 0818   LABSPEC 1.026 08/27/2023 0818   PHURINE 5.0 08/27/2023 0818    GLUCOSEU NEGATIVE 08/27/2023 0818   HGBUR NEGATIVE 08/27/2023 0818   BILIRUBINUR NEGATIVE 08/27/2023 0818   KETONESUR NEGATIVE 08/27/2023 0818   PROTEINUR NEGATIVE 08/27/2023 0818   NITRITE NEGATIVE 08/27/2023 0818   LEUKOCYTESUR NEGATIVE 08/27/2023 0818   Sepsis Labs Recent Labs  Lab 08/26/23 1944 08/27/23 0451 08/28/23 0626  WBC 6.7 6.7 6.0   Microbiology No results found for this or any previous visit (from the past 240 hours).   Time coordinating discharge: 25 minutes  SIGNED: Lanae Boast, MD  [facility] 08/28/2023, 1:51 PM  If 7PM-7AM, please contact night-coverage [email or web address]

## 2023-09-03 ENCOUNTER — Ambulatory Visit (INDEPENDENT_AMBULATORY_CARE_PROVIDER_SITE_OTHER): Payer: Medicare (Managed Care)

## 2023-09-03 ENCOUNTER — Ambulatory Visit (INDEPENDENT_AMBULATORY_CARE_PROVIDER_SITE_OTHER): Payer: Medicare (Managed Care) | Admitting: Family Medicine

## 2023-09-03 ENCOUNTER — Encounter: Payer: Self-pay | Admitting: Family Medicine

## 2023-09-03 VITALS — BP 118/63 | HR 97 | Temp 97.5°F | Resp 18 | Ht 64.0 in | Wt 300.1 lb

## 2023-09-03 DIAGNOSIS — K219 Gastro-esophageal reflux disease without esophagitis: Secondary | ICD-10-CM

## 2023-09-03 DIAGNOSIS — R0609 Other forms of dyspnea: Secondary | ICD-10-CM | POA: Diagnosis not present

## 2023-09-03 DIAGNOSIS — H353121 Nonexudative age-related macular degeneration, left eye, early dry stage: Secondary | ICD-10-CM | POA: Diagnosis not present

## 2023-09-03 DIAGNOSIS — K7682 Hepatic encephalopathy: Secondary | ICD-10-CM | POA: Diagnosis not present

## 2023-09-03 DIAGNOSIS — I700 Atherosclerosis of aorta: Secondary | ICD-10-CM | POA: Diagnosis not present

## 2023-09-03 DIAGNOSIS — K7581 Nonalcoholic steatohepatitis (NASH): Secondary | ICD-10-CM | POA: Diagnosis not present

## 2023-09-03 DIAGNOSIS — F411 Generalized anxiety disorder: Secondary | ICD-10-CM | POA: Diagnosis not present

## 2023-09-03 DIAGNOSIS — R600 Localized edema: Secondary | ICD-10-CM | POA: Diagnosis not present

## 2023-09-03 DIAGNOSIS — R0989 Other specified symptoms and signs involving the circulatory and respiratory systems: Secondary | ICD-10-CM | POA: Diagnosis not present

## 2023-09-03 DIAGNOSIS — D696 Thrombocytopenia, unspecified: Secondary | ICD-10-CM

## 2023-09-03 DIAGNOSIS — H353112 Nonexudative age-related macular degeneration, right eye, intermediate dry stage: Secondary | ICD-10-CM | POA: Diagnosis not present

## 2023-09-03 MED ORDER — SERTRALINE HCL 50 MG PO TABS: 50.0000 mg | ORAL_TABLET | Freq: Every day | ORAL | 1 refills | Status: DC

## 2023-09-03 MED ORDER — SPIRONOLACTONE 25 MG PO TABS
25.0000 mg | ORAL_TABLET | Freq: Every day | ORAL | 1 refills | Status: DC
Start: 2023-09-03 — End: 2024-02-02

## 2023-09-03 MED ORDER — CARVEDILOL 3.125 MG PO TABS: 3.1250 mg | ORAL_TABLET | Freq: Two times a day (BID) | ORAL | 1 refills | Status: DC

## 2023-09-03 MED ORDER — FUROSEMIDE 20 MG PO TABS: 20.0000 mg | ORAL_TABLET | Freq: Two times a day (BID) | ORAL | 1 refills | Status: DC | PRN

## 2023-09-03 MED ORDER — BUSPIRONE HCL 10 MG PO TABS: 10.0000 mg | ORAL_TABLET | Freq: Two times a day (BID) | ORAL | 1 refills | Status: DC

## 2023-09-03 MED ORDER — POTASSIUM CHLORIDE CRYS ER 20 MEQ PO TBCR: 20.0000 meq | EXTENDED_RELEASE_TABLET | Freq: Every day | ORAL | 1 refills | Status: AC

## 2023-09-03 MED ORDER — PANTOPRAZOLE SODIUM 40 MG PO TBEC: DELAYED_RELEASE_TABLET | ORAL | 3 refills | Status: AC

## 2023-09-03 NOTE — Progress Notes (Signed)
 Subjective:     Patient ID: [facility], female    DOB: April 14, 1961, 63 y.o.   MRN: [id number]  Chief Complaint  Patient presents with   Follow-up    [facility] follow-up from 08/26/23, completed meds for UTI Getting winded with minimal exertion     HPI [facility] 4/3 to 4/5-confusion 3 days-duricef for uti. No cx Discussed the use of AI scribe software for clinical note transcription with the patient, who gave verbal consent to proceed.  History of Present Illness [facility] is a 63 year old female who presents for [facility] f/u.  She has been experiencing increasing confusion, which led to a hospitalization from April 3rd to April 5th. During the hospitalization, a urinary tract infection was suspected, but no culture was sent. She was treated with Rocephin in the [facility] and discharged with Duricef, which she completed. Her cirrhosis(MAFLD) complicates things as well w/some encephalopathy  She experiences shortness of breath with minor exertion, such as walking from a store to a car or vacuuming, which has been present for a couple of weeks. No chest pain, pressure, heartburn, cough, fever, or chills. There has been no improvement in her symptoms since completing antibiotics. She reports swelling in her legs and hands, particularly during her recent [facility] stay, and takes Lasix 20 mg daily for fluid management.  She reports a history of vaginal bleeding since the insertion of an IUD, with significant clotting. She has been prescribed a PRN medication to stop the bleeding, which has been effective. She has undergone two D&Cs without resolution and is not a candidate for surgery due to liver issues. The IUD was placed to help manage the bleeding with hormones.  She has experienced falls, which she attributes to tripping, and has noted dizziness when bending over. She is on carvedilol, which may contribute to dizziness as well as lasix and spironolactone.  She is currently on nortriptyline, which  she started while on pain medication for her ankle, but is unsure why she continues to take it. She also takes Buspar for anxiety. She has a history of depression and was previously on Serzone, which affected her liver.  She feels discouraged and slightly depressed due to her current health issues. No SI    Health Maintenance Due  Topic Date Due   DEXA SCAN  Never done    Past Medical History:  Diagnosis Date   Allergy to environmental factors    Anxiety    Follows w/ PCP Dr. Roberts Ching @ [facility].   Arthritis    ankles   Bilateral lower extremity edema 2023   Chronic pain    LLE reflex sympathetic dystrophy, follws with Pain Management, Amaryllis Badger, PA.   COVID-19 03/08/2021   Depression    Follows w/ PCP.   Esophageal varices (HCC)    Follows w/ Duey Ghent, NP @ [facility].   Gallstones    GERD (gastroesophageal reflux disease)    taking Protonix   Headache    stopped after menopause   Hemolytic anemia (HCC)    Follows w/ Dr. Bernis Brisker Iruku @ [facility], see 06/15/22 OV in Epic, s/p iron infusions   Hepatic encephalopathy (HCC) 10/11/2021   treated w/ lactulose and rifaximim   History of pneumonia 08/2021   [facility] admission on 09/22/21.   Liver cirrhosis secondary to NASH [facility])    Follows w/ Dr. Letta Raw @ [facility] and Duey Ghent, NP @ [facility].   Neuromuscular disorder (HCC)    reflex sympathetic dystrophy of LLE, follows w/ pain managemnt. Patient stated she broke her ankle again and nerve pain resolved.   PMB (postmenopausal bleeding) 2023   Thrombocytopenia (HCC)    Follows w/ Dr. Bernis Brisker Iruku @ [facility].   Urticaria    Wears glasses     Past Surgical History:  Procedure Laterality Date   APPENDECTOMY  1979   BIOPSY  09/25/2021   Procedure: BIOPSY;  Surgeon: Ace Holder, MD;  Location: [facility];  Service: Gastroenterology;;   BREAST SURGERY  1980   breast  reduction   CHOLECYSTECTOMY     as a young adult   COLONOSCOPY WITH PROPOFOL N/A 09/25/2021   Procedure: COLONOSCOPY WITH PROPOFOL;  Surgeon: Ace Holder, MD;  Location: [facility] ENDOSCOPY;  Service: Gastroenterology;  Laterality: N/A;   DILATATION & CURETTAGE/HYSTEROSCOPY WITH MYOSURE N/A 06/19/2022   Procedure: DILATATION & CURETTAGE/HYSTEROSCOPY WITH MYOSURE LITE;  Surgeon: Grace Laura, MD;  Location: [facility];  Service: Gynecology;  Laterality: N/A;   DILATATION & CURETTAGE/HYSTEROSCOPY WITH MYOSURE N/A 04/05/2023   Procedure: DILATATION & CURETTAGE/HYSTEROSCOPY WITH MYOSURE LITE;  Surgeon: Grace Laura, MD;  Location: [place] [facility];  Service: Gynecology;  Laterality: N/A;   ESOPHAGOGASTRODUODENOSCOPY N/A 09/25/2021   Procedure: ESOPHAGOGASTRODUODENOSCOPY (EGD);  Surgeon: Ace Holder, MD;  Location: [facility];  Service: Gastroenterology;  Laterality: N/A;   NASAL SEPTUM SURGERY     when patient was in her 8's   ORIF ANKLE FRACTURE Left 03/26/2021   Procedure: OPEN REDUCTION INTERNAL FIXATION (ORIF) ANKLE FRACTURE;  Surgeon: Ali Ink, MD;  Location: [facility] OR;  Service: Orthopedics;  Laterality: Left;   POLYPECTOMY  09/25/2021   Procedure: POLYPECTOMY;  Surgeon: Ace Holder, MD;  Location: [facility] ENDOSCOPY;  Service: Gastroenterology;;     Current Outpatient Medications:    baclofen (LIORESAL) 20 MG tablet, Take 1 tablet (20 mg total) by mouth 2 (two) times daily., Disp: 180 each, Rfl: 3   CALCIUM CITRATE PO, Take 1 tablet by mouth at bedtime., Disp: , Rfl:    cyanocobalamin (VITAMIN B12) 1000 MCG tablet, Take 1,000 mcg by mouth daily., Disp: , Rfl:    diazepam (VALIUM) 2 MG tablet, Take 1 tab 30 minutes prior to MRI, may add an additional tab if needed, Disp: 2 tablet, Rfl: 0   EPINEPHrine 0.3 mg/0.3 mL IJ SOAJ injection, Inject into the muscle., Disp: , Rfl:    lactulose (CHRONULAC) 10 GM/15ML solution, Take 30 mLs by mouth 3  (three) times daily. Takes 2 to 3 times daily., Disp: , Rfl:    nortriptyline (PAMELOR) 50 MG capsule, Take 2 capsules (100 mg total) by mouth at bedtime., Disp: 180 capsule, Rfl: 1   potassium chloride SA (KLOR-CON M) 20 MEQ tablet, Take 1 tablet (20 mEq total) by mouth daily. Only on days take the second lasix, Disp: 90 tablet, Rfl: 1   rifaximin (XIFAXAN) 550 MG TABS tablet, Take 550 mg by mouth in the morning and at bedtime., Disp: , Rfl:    sertraline (ZOLOFT) 50 MG tablet, Take 1 tablet (50 mg total) by mouth daily., Disp: 30 tablet, Rfl: 1   tranexamic acid (LYSTEDA) 650 MG TABS tablet, Take 1,300 mg by mouth 3 (three) times daily as needed., Disp: , Rfl:    VITAMIN D PO, Take 1 tablet by mouth at bedtime., Disp: , Rfl:    busPIRone (BUSPAR) 10 MG tablet, Take 1 tablet (10 mg total)  by mouth 2 (two) times daily., Disp: 180 tablet, Rfl: 1   carvedilol (COREG) 3.125 MG tablet, Take 1 tablet (3.125 mg total) by mouth in the morning and at bedtime., Disp: 180 tablet, Rfl: 1   furosemide (LASIX) 20 MG tablet, Take 1 tablet (20 mg total) by mouth 2 (two) times daily as needed., Disp: 180 tablet, Rfl: 1   pantoprazole (PROTONIX) 40 MG tablet, TAKE 1 TABLET(40 MG) BY MOUTH DAILY, Disp: 90 tablet, Rfl: 3   spironolactone (ALDACTONE) 25 MG tablet, Take 1 tablet (25 mg total) by mouth daily., Disp: 90 tablet, Rfl: 1  Allergies  Allergen Reactions   Shellfish Allergy Anaphylaxis, Hives, Itching, Shortness Of Breath and Swelling   Amitriptyline     rash   Iodine Hives    Other Reaction(s): Other (See Comments)   Shellfish-Derived Products    ROS neg/noncontributory except as noted HPI/below      Objective:     BP 118/63   Pulse 97   Temp (!) 97.5 F (36.4 C) (Temporal)   Resp 18   Ht 5\' 4"  (1.626 m)   Wt (!) 300 lb 2 oz (136.1 kg)   SpO2 100%   BMI 51.52 kg/m  Wt Readings from Last 3 Encounters:  09/03/23 (!) 300 lb 2 oz (136.1 kg)  08/26/23 300 lb (136.1 kg)  08/13/23 297 lb  (134.7 kg)    Physical Exam   Gen: WDWN NAD HEENT: NCAT, conjunctiva not injected, sclera nonicteric NECK:  supple, no thyromegaly, no nodes, no carotid bruits CARDIAC: RRR, S1S2+, no murmur. DP 2+B LUNGS: CTAB. No wheezes ABDOMEN:  BS+, soft, NTND, No HSM, no masses EXT:  2+ edema MSK: no gross abnormalities.  NEURO: A&O x3.  CN II-XII intact.  PSYCH: normal mood. Good eye contact  Reviewed hosp records.  Reconciled med list.    Assessment & Plan:  Hepatic encephalopathy (HCC)  Metabolic dysfunction-associated steatohepatitis (MASH) -     CBC with Differential/Platelet -     Comprehensive metabolic panel with GFR -     Hemoglobin A1c -     Carvedilol; Take 1 tablet (3.125 mg total) by mouth in the morning and at bedtime.  Dispense: 180 tablet; Refill: 1 -     Spironolactone; Take 1 tablet (25 mg total) by mouth daily.  Dispense: 90 tablet; Refill: 1 -     Furosemide; Take 1 tablet (20 mg total) by mouth 2 (two) times daily as needed.  Dispense: 180 tablet; Refill: 1  Thrombocytopenia (HCC) -     CBC with Differential/Platelet  DOE (dyspnea on exertion) -     DG Chest 2 View; Future  GAD (generalized anxiety disorder) -     busPIRone HCl; Take 1 tablet (10 mg total) by mouth 2 (two) times daily.  Dispense: 180 tablet; Refill: 1 -     Sertraline HCl; Take 1 tablet (50 mg total) by mouth daily.  Dispense: 30 tablet; Refill: 1  Gastroesophageal reflux disease without esophagitis -     Pantoprazole Sodium; TAKE 1 TABLET(40 MG) BY MOUTH DAILY  Dispense: 90 tablet; Refill: 3  Localized edema  Other orders -     Potassium Chloride Crys ER; Take 1 tablet (20 mEq total) by mouth daily. Only on days take the second lasix  Dispense: 90 tablet; Refill: 1  Assessment and Plan Assessment & Plan Hepatic Encephalopathy   She experiences recurrent confusion likely due to hepatic encephalopathy, with a recent hospitalization from April 3 to April 5. She  plans to increase lactulose to  prevent future episodes. An MRI was ordered but not completed due to a panic attack, and an EEG was normal. Neurocognitive testing is scheduled for October. Increase lactulose as needed. Reschedule MRI with Valium premedication for anxiety. Follow up with a neurologist in October.  Dyspnea on Exertion   She has recent dyspnea with minor exertion, not linked to chest pain or other symptoms, and unresponsive to antibiotics. Possible fluid overload from liver condition is suspected. She is on furosemide daily so will short ter, increase to bid, with increased dosage potentially causing hypotension, requiring careful monitoring. Order a chest x-ray to rule out pulmonary issues. Increase furosemide to twice daily as needed. Monitor oxygen saturation during dyspnea episodes. Start potassium supplementation on days with increased furosemide.  Fluid Retention   Recent leg and hand swelling suggests fluid overload. She takes furosemide 20 mg daily, with concerns about liver function decompensation. Increasing furosemide may help but requires monitoring blood pressure and potassium levels. Increase furosemide to twice daily as needed. Start potassium supplementation on days with increased furosemide. Follow up with a liver specialist in early May.  Vaginal Bleeding   Persistent vaginal bleeding since IUD insertion, with large clots, persists despite two D&Cs. Surgery is not an option due to liver condition. Managed with Mirena IUD and PRN medication. No endometrial ablation performed yet. Continue Mirena IUD. Use PRN medication to control bleeding. Consider endometrial ablation if bleeding persists.  Depression   She feels discouraged and slightly depressed due to health issues. Previously on Serzone, which affected liver function, now on nortriptyline, which may cause confusion. Plan to taper nortriptyline and start sertraline. Discussed sertraline side effects, including mood changes and reporting suicidal  thoughts. Taper nortriptyline: one tablet at night for two weeks, then one every other night for two weeks, then stop. Start sertraline: half tablet per day for one week, then increase to one tablet per day. Schedule follow-up in one month to assess mood and medication effects.  Dizziness   She experiences dizziness when bending over, likely due to carvedilol. Adequate hydration and electrolyte intake are recommended. Monitoring blood pressure is important, especially on days with increased furosemide, to prevent falls. Add electrolytes to one water daily. Monitor blood pressure, especially on days with increased furosemide.    Return in about 4 weeks (around 10/01/2023) for mood.  Ellsworth Haas, MD

## 2023-09-03 NOTE — Patient Instructions (Addendum)
 Nortriptylline-only 1 at night for 2 wks, then 1 every other night for 2 wks, then stop.  Furosemide 2x/day as needed  Sertraline-1/2 tab per day for 1 wk, then whole tab.   Add electrolytes to 1 water daily

## 2023-09-05 ENCOUNTER — Encounter: Payer: Self-pay | Admitting: Family Medicine

## 2023-09-05 NOTE — Progress Notes (Signed)
 Possibly a little fluid overloaded-how is she doing with the increased lasix?

## 2023-09-06 ENCOUNTER — Encounter: Payer: Self-pay | Admitting: Family Medicine

## 2023-09-06 ENCOUNTER — Other Ambulatory Visit: Payer: Medicare (Managed Care)

## 2023-09-06 LAB — CBC WITH DIFFERENTIAL/PLATELET
Basophils Absolute: 0.1 10*3/uL (ref 0.0–0.1)
Basophils Relative: 1.1 % (ref 0.0–3.0)
Eosinophils Absolute: 0.2 10*3/uL (ref 0.0–0.7)
Eosinophils Relative: 4 % (ref 0.0–5.0)
HCT: 37.6 % (ref 36.0–46.0)
Hemoglobin: 12.6 g/dL (ref 12.0–15.0)
Lymphocytes Relative: 26.2 % (ref 12.0–46.0)
Lymphs Abs: 1.2 10*3/uL (ref 0.7–4.0)
MCHC: 33.6 g/dL (ref 30.0–36.0)
MCV: 96.5 fl (ref 78.0–100.0)
Monocytes Absolute: 0.4 10*3/uL (ref 0.1–1.0)
Monocytes Relative: 7.5 % (ref 3.0–12.0)
Neutro Abs: 2.9 10*3/uL (ref 1.4–7.7)
Neutrophils Relative %: 61.2 % (ref 43.0–77.0)
Platelets: 94 10*3/uL — ABNORMAL LOW (ref 150.0–400.0)
RBC: 3.9 Mil/uL (ref 3.87–5.11)
RDW: 15.7 % — ABNORMAL HIGH (ref 11.5–15.5)
WBC: 4.8 10*3/uL (ref 4.0–10.5)

## 2023-09-06 LAB — COMPREHENSIVE METABOLIC PANEL WITH GFR
ALT: 31 U/L (ref 0–35)
AST: 64 U/L — ABNORMAL HIGH (ref 0–37)
Albumin: 3.6 g/dL (ref 3.5–5.2)
Alkaline Phosphatase: 218 U/L — ABNORMAL HIGH (ref 39–117)
BUN: 14 mg/dL (ref 6–23)
CO2: 29 meq/L (ref 19–32)
Calcium: 8.9 mg/dL (ref 8.4–10.5)
Chloride: 106 meq/L (ref 96–112)
Creatinine, Ser: 0.87 mg/dL (ref 0.40–1.20)
GFR: 70.11 mL/min (ref >60.00)
Glucose, Bld: 117 mg/dL — ABNORMAL HIGH (ref 70–99)
Potassium: 4.5 meq/L (ref 3.5–5.1)
Sodium: 140 meq/L (ref 135–145)
Total Bilirubin: 1.6 mg/dL — ABNORMAL HIGH (ref 0.2–1.2)
Total Protein: 6.7 g/dL (ref 6.0–8.3)

## 2023-09-06 LAB — HEMOGLOBIN A1C: Hgb A1c MFr Bld: 5.4 % (ref 4.6–6.5)

## 2023-09-06 NOTE — Progress Notes (Signed)
 Labs stable.  Alk phos a little more elevated.  Will reck next month.

## 2023-09-30 ENCOUNTER — Other Ambulatory Visit: Payer: Self-pay | Admitting: Nurse Practitioner

## 2023-09-30 DIAGNOSIS — K7682 Hepatic encephalopathy: Secondary | ICD-10-CM

## 2023-09-30 DIAGNOSIS — I8510 Secondary esophageal varices without bleeding: Secondary | ICD-10-CM | POA: Diagnosis not present

## 2023-09-30 DIAGNOSIS — K7581 Nonalcoholic steatohepatitis (NASH): Secondary | ICD-10-CM | POA: Diagnosis not present

## 2023-09-30 DIAGNOSIS — K743 Primary biliary cirrhosis: Secondary | ICD-10-CM

## 2023-09-30 DIAGNOSIS — K7460 Unspecified cirrhosis of liver: Secondary | ICD-10-CM | POA: Diagnosis not present

## 2023-10-01 ENCOUNTER — Ambulatory Visit (INDEPENDENT_AMBULATORY_CARE_PROVIDER_SITE_OTHER): Payer: Medicare (Managed Care) | Admitting: Family Medicine

## 2023-10-01 ENCOUNTER — Encounter: Payer: Self-pay | Admitting: Family Medicine

## 2023-10-01 ENCOUNTER — Encounter: Payer: Self-pay | Admitting: Hematology and Oncology

## 2023-10-01 VITALS — BP 111/59 | HR 79 | Temp 97.8°F | Resp 18 | Ht 64.0 in | Wt 290.5 lb

## 2023-10-01 DIAGNOSIS — K7581 Nonalcoholic steatohepatitis (NASH): Secondary | ICD-10-CM

## 2023-10-01 DIAGNOSIS — D508 Other iron deficiency anemias: Secondary | ICD-10-CM | POA: Diagnosis not present

## 2023-10-01 DIAGNOSIS — F411 Generalized anxiety disorder: Secondary | ICD-10-CM

## 2023-10-01 LAB — MAGNESIUM: Magnesium: 2.1 mg/dL (ref 1.5–2.5)

## 2023-10-01 LAB — CBC WITH DIFFERENTIAL/PLATELET
Basophils Absolute: 0 10*3/uL (ref 0.0–0.1)
Basophils Relative: 0.8 % (ref 0.0–3.0)
Eosinophils Absolute: 0.1 10*3/uL (ref 0.0–0.7)
Eosinophils Relative: 2.7 % (ref 0.0–5.0)
HCT: 39 % (ref 36.0–46.0)
Hemoglobin: 13.1 g/dL (ref 12.0–15.0)
Lymphocytes Relative: 26.9 % (ref 12.0–46.0)
Lymphs Abs: 1.5 10*3/uL (ref 0.7–4.0)
MCHC: 33.5 g/dL (ref 30.0–36.0)
MCV: 95.4 fl (ref 78.0–100.0)
Monocytes Absolute: 0.4 10*3/uL (ref 0.1–1.0)
Monocytes Relative: 7.6 % (ref 3.0–12.0)
Neutro Abs: 3.4 10*3/uL (ref 1.4–7.7)
Neutrophils Relative %: 62 % (ref 43.0–77.0)
Platelets: 88 10*3/uL — ABNORMAL LOW (ref 150.0–400.0)
RBC: 4.09 Mil/uL (ref 3.87–5.11)
RDW: 15.3 % (ref 11.5–15.5)
WBC: 5.4 10*3/uL (ref 4.0–10.5)

## 2023-10-01 LAB — COMPREHENSIVE METABOLIC PANEL WITH GFR
ALT: 28 U/L (ref 0–35)
AST: 61 U/L — ABNORMAL HIGH (ref 0–37)
Albumin: 3.7 g/dL (ref 3.5–5.2)
Alkaline Phosphatase: 104 U/L (ref 39–117)
BUN: 16 mg/dL (ref 6–23)
CO2: 28 meq/L (ref 19–32)
Calcium: 9 mg/dL (ref 8.4–10.5)
Chloride: 104 meq/L (ref 96–112)
Creatinine, Ser: 0.86 mg/dL (ref 0.40–1.20)
GFR: 71.06 mL/min (ref >60.00)
Glucose, Bld: 112 mg/dL — ABNORMAL HIGH (ref 70–99)
Potassium: 4.3 meq/L (ref 3.5–5.1)
Sodium: 138 meq/L (ref 135–145)
Total Bilirubin: 2.2 mg/dL — ABNORMAL HIGH (ref 0.2–1.2)
Total Protein: 7 g/dL (ref 6.0–8.3)

## 2023-10-01 LAB — PROTIME-INR
INR: 1.7 ratio — ABNORMAL HIGH (ref 0.8–1.0)
Prothrombin Time: 17.9 s — ABNORMAL HIGH (ref 9.6–13.1)

## 2023-10-01 MED ORDER — SERTRALINE HCL 50 MG PO TABS
50.0000 mg | ORAL_TABLET | Freq: Every day | ORAL | 1 refills | Status: DC
Start: 2023-10-01 — End: 2024-03-25

## 2023-10-01 NOTE — Patient Instructions (Signed)

## 2023-10-01 NOTE — Progress Notes (Signed)
 Subjective:     Patient ID: [facility], female    DOB: 08-30-1960, 63 y.o.   MRN: [id number]  Chief Complaint  Patient presents with   Medical Management of Chronic Issues    4 week follow-up Fasting     HPI Depression Discussed the use of AI scribe software for clinical note transcription with the patient, who gave verbal consent to proceed.  History of Present Illness [facility] is a 63 year old female with non-alcoholic fatty liver disease who presents for follow-up on her liver condition and other health issues.  Her liver function levels remain stable, and she is scheduled for an annual abdominal ultrasound. Additional blood work has been requested by her liver specialist.  She experiences shortness of breath and swelling, for which she took an extra dose of furosemide  on one occasion. She takes furosemide  daily and has started taking potassium daily due to leg cramping. The cramping occurs periodically throughout the day, especially at night and when sitting, but has improved slightly with potassium supplementation.  Her mood has improved significantly, and she is currently taking buspirone  10 mg twice daily and sertraline  50 mg once daily. No thoughts of suicide. She notes a decreased appetite and is making an effort to eat regularly. She has discontinued nortriptyline  and reports doing well without it. Moods are so much better.    She experiences dizziness, particularly when bending over, usually near the end of the day, and manages this by asking others to assist with tasks that require bending.  She continues to take spironolactone  daily. She mentions having a total of fifteen prescriptions and expresses a willingness to continue them if they maintain her health.    Health Maintenance Due  Topic Date Due   DEXA SCAN  Never done    Past Medical History:  Diagnosis Date   Allergy to environmental factors    Anxiety    Follows w/ PCP Dr. Roberts [facility] @ [facility].   Arthritis    ankles   Bilateral lower extremity edema 2023   Chronic pain    LLE reflex sympathetic dystrophy, follws with Pain Management, Theresa Badger, PA.   COVID-19 03/08/2021   Depression    Follows w/ PCP.   Esophageal varices (HCC)    Follows w/ Theresa Ghent, NP @ [facility].   Gallstones    GERD (gastroesophageal reflux disease)    taking Protonix    Headache    stopped after menopause   Hemolytic anemia (HCC)    Follows w/ Dr. Bernis Brisker [facility] @ [facility], see 06/15/22 OV in Epic, s/p iron infusions   Hepatic encephalopathy (HCC) 10/11/2021   treated w/ lactulose  and rifaximim   History of pneumonia 08/2021   Hospital admission on 09/22/21.   Liver cirrhosis secondary to NASH [facility])    Follows w/ Dr. Letta Raw @ [facility] and Theresa Ghent, NP @ [facility].   Neuromuscular disorder (HCC)    reflex sympathetic dystrophy of LLE, follows w/ pain managemnt. Patient stated she broke her ankle again and nerve pain resolved.   PMB (postmenopausal bleeding) 2023   Thrombocytopenia (HCC)    Follows w/ Dr. Bernis Brisker [facility] @ [facility].   Urticaria    Wears glasses     Past Surgical History:  Procedure Laterality Date   APPENDECTOMY  1979   BIOPSY  09/25/2021   Procedure: BIOPSY;  Surgeon: [facility], MD;  [facility]: [facility] ENDOSCOPY;  Service: Gastroenterology;;   BREAST SURGERY  1980   breast reduction   CHOLECYSTECTOMY     as a young adult   COLONOSCOPY WITH PROPOFOL  N/A 09/25/2021   Procedure: COLONOSCOPY WITH PROPOFOL ;  Surgeon: [facility], MD;  Location: [facility] ENDOSCOPY;  Service: Gastroenterology;  Laterality: N/A;   DILATATION & CURETTAGE/HYSTEROSCOPY WITH MYOSURE N/A 06/19/2022   Procedure: DILATATION & CURETTAGE/HYSTEROSCOPY WITH MYOSURE LITE;  Surgeon: [facility] Laura, MD;  Location: [facility];  Service: Gynecology;  Laterality: N/A;   DILATATION & CURETTAGE/HYSTEROSCOPY  WITH MYOSURE N/A 04/05/2023   Procedure: DILATATION & CURETTAGE/HYSTEROSCOPY WITH MYOSURE LITE;  Surgeon: [facility] Laura, MD;  Location: [facility];  Service: Gynecology;  Laterality: N/A;   ESOPHAGOGASTRODUODENOSCOPY N/A 09/25/2021   Procedure: ESOPHAGOGASTRODUODENOSCOPY (EGD);  Surgeon: [facility], MD;  Location[facility] ENDOSCOPY;  Service: Gastroenterology;  Laterality: N/A;   NASAL SEPTUM SURGERY     when patient was in her 25's   ORIF ANKLE FRACTURE Left 03/26/2021   Procedure: OPEN REDUCTION INTERNAL FIXATION (ORIF) ANKLE FRACTURE;  Surgeon: [facility] Ink, MD;  Location[facility] OR;  Service: Orthopedics;  Laterality: Left;   POLYPECTOMY  09/25/2021   Procedure: POLYPECTOMY;  Surgeon: [facility], MD;  Location: [facility] ENDOS[facility];  Service: Gastroenterology;;     Current Outpatient Medications:    baclofen  (LIORESAL ) 20 MG tablet, Take 1 tablet (20 mg total) by mouth 2 (two) times daily., Disp: 180 each, Rfl: 3   busPIRone  (BUSPAR ) 10 MG tablet, Take 1 tablet (10 mg total) by mouth 2 (two) times daily., Disp: 180 tablet, Rfl: 1   CALCIUM CITRATE PO, Take 1 tablet by mouth at bedtime., Disp: , Rfl:    carvedilol  (COREG ) 3.125 MG tablet, Take 1 tablet (3.125 mg total) by mouth in the morning and at bedtime., Disp: 180 tablet, Rfl: 1   cyanocobalamin  (VITAMIN B12) 1000 MCG tablet, Take 1,000 mcg by mouth daily., Disp: , Rfl:    diazepam  (VALIUM ) 2 MG tablet, Take 1 tab 30 minutes prior to MRI, may add an additional tab if needed, Disp: 2 tablet, Rfl: 0   EPINEPHrine  0.3 mg/0.3 mL IJ SOAJ injection, Inject into the muscle., Disp: , Rfl:    furosemide  (LASIX ) 20 MG tablet, Take 1 tablet (20 mg total) by mouth 2 (two) times daily as needed., Disp: 180 tablet, Rfl: 1   lactulose  (CHRONULAC ) 10 GM/15ML solution, Take 30 mLs by mouth 3 (three) times daily. Takes 2 to 3 times daily., Disp: , Rfl:    levonorgestrel (MIRENA, 52 MG,) 20 MCG/DAY IUD, 1 each by Intrauterine  route., Disp: , Rfl:    pantoprazole  (PROTONIX ) 40 MG tablet, TAKE 1 TABLET(40 MG) BY MOUTH DAILY, Disp: 90 tablet, Rfl: 3   potassium chloride  SA (KLOR-CON  M) 20 MEQ tablet, Take 1 tablet (20 mEq total) by mouth daily. Only on days take the second lasix , Disp: 90 tablet, Rfl: 1   rifaximin  (XIFAXAN ) 550 MG TABS tablet, Take 550 mg by mouth in the morning and at bedtime., Disp: , Rfl:    spironolactone  (ALDACTONE ) 25 MG tablet, Take 1 tablet (25 mg total) by mouth daily., Disp: 90 tablet, Rfl: 1   tranexamic acid  (LYSTEDA ) 650 MG TABS tablet, Take 1,300 mg by mouth 3 (three) times daily as needed., Disp: , Rfl:    VITAMIN D PO, Take 1 tablet by mouth at bedtime., Disp: , Rfl:    sertraline  (ZOLOFT ) 50 MG tablet, Take 1 tablet (50 mg total) by mouth  daily., Disp: 90 tablet, Rfl: 1  Allergies  Allergen Reactions   Shellfish Allergy Anaphylaxis, Hives, Itching, Shortness Of Breath and Swelling   Amitriptyline     rash   Iodine  Hives    Other Reaction(s): Other (See Comments)   Shellfish-Derived Products    ROS neg/noncontributory except as noted HPI/below      Objective:      BP (!) 111/59   Pulse 79   Temp 97.8 F (36.6 C) (Temporal)   Resp 18   Ht 5\' 4"  (1.626 m)   Wt 290 lb 8 oz (131.8 kg)   SpO2 97%   BMI 49.86 kg/m  Wt Readings from Last 3 Encounters:  10/01/23 290 lb 8 oz (131.8 kg)  09/03/23 (!) 300 lb 2 oz (136.1 kg)  08/26/23 300 lb (136.1 kg)    Physical Exam   Gen: WDWN NAD HEENT: NCAT, conjunctiva not injected, sclera nonicteric CARDIAC: RRR, S1S2+, no murmur.  MSK: no gross abnormalities.  NEURO: A&O x3.  CN II-XII intact.  PSYCH: normal mood. Good eye contact     Assessment & Plan:  Metabolic dysfunction-associated steatohepatitis (MASH) -     Magnesium -     CBC with Differential/Platelet -     Comprehensive metabolic panel with GFR -     Protime-INR -     AFP tumor marker  GAD (generalized anxiety disorder) -     Sertraline  HCl; Take 1 tablet  (50 mg total) by mouth daily.  Dispense: 90 tablet; Refill: 1  Other iron deficiency anemia -     CBC with Differential/Platelet  Assessment and Plan Assessment & Plan Dizziness   Intermittent dizziness occurs, especially when bending over, with low blood pressure at 111/59 mmHg potentially contributing. She should take it easy on days requiring extra furosemide  due to potential further blood pressure reduction. Monitor blood pressure regularly.  Edema   Edema has improved with furosemide  and support stockings. Shortness of breath has resolved. She is on daily furosemide  with additional doses as needed. Continue furosemide  daily and use support stockings as needed.  Leg cramps   She experiences leg cramps, particularly at night and periodically during the day. Potassium supplementation has improved symptoms. Magnesium levels are normal, but magnesium supplementation is being considered for additional relief. Continue potassium supplementation daily and consider magnesium supplementation.  Depression   Depression has improved with the current medication regimen. There is no suicidal ideation. Appetite is decreased but not negatively impacting health. Current medications include buspirone  10 mg twice daily and sertraline  50 mg once daily. Nortriptyline  was discontinued with no adverse effects. A potential future sertraline  dosage adjustment was discussed, but the current dose is appropriate. Continue buspirone  10 mg twice daily and sertraline  50 mg once daily with a 90-day supply. Monitor for mood changes or need for dosage adjustment.  Non-alcoholic fatty liver disease   Non-alcoholic fatty liver disease is well-managed. A recent consultation with Dawn indicated normal liver levels. She is scheduled for an annual abdominal ultrasound and additional blood work as ordered by [employer]. She should inform Dawn once results are available. Order and perform the annual abdominal ultrasound and complete  additional blood work as ordered.    Return in about 6 months (around 04/02/2024) for chronic follow-up.  Ellsworth Haas, MD

## 2023-10-04 ENCOUNTER — Encounter: Payer: Self-pay | Admitting: Family Medicine

## 2023-10-04 LAB — AFP TUMOR MARKER: AFP-Tumor Marker: 6.8 ng/mL — ABNORMAL HIGH

## 2023-10-04 NOTE — Progress Notes (Signed)
 Labs stable.  AFP slightly elevated-make sure she shares w/her liver doctor.  May be ok

## 2023-10-05 ENCOUNTER — Ambulatory Visit: Payer: Self-pay | Admitting: *Deleted

## 2023-10-05 ENCOUNTER — Ambulatory Visit
Admission: RE | Admit: 2023-10-05 | Discharge: 2023-10-05 | Disposition: A | Discharge status: Home / Self Care | Payer: Medicare (Managed Care) | Source: Ambulatory Visit | Attending: Nurse Practitioner | Admitting: Nurse Practitioner

## 2023-10-05 DIAGNOSIS — K7581 Nonalcoholic steatohepatitis (NASH): Secondary | ICD-10-CM

## 2023-10-05 DIAGNOSIS — K7682 Hepatic encephalopathy: Secondary | ICD-10-CM

## 2023-10-05 DIAGNOSIS — K743 Primary biliary cirrhosis: Secondary | ICD-10-CM

## 2023-10-05 DIAGNOSIS — K769 Liver disease, unspecified: Secondary | ICD-10-CM | POA: Diagnosis not present

## 2023-10-25 ENCOUNTER — Ambulatory Visit (INDEPENDENT_AMBULATORY_CARE_PROVIDER_SITE_OTHER): Payer: Medicare (Managed Care) | Admitting: Family Medicine

## 2023-10-25 ENCOUNTER — Encounter: Payer: Self-pay | Admitting: Family Medicine

## 2023-10-25 VITALS — BP 119/64 | HR 85 | Temp 97.7°F | Resp 16 | Ht 64.0 in | Wt 285.1 lb

## 2023-10-25 DIAGNOSIS — Z0000 Encounter for general adult medical examination without abnormal findings: Secondary | ICD-10-CM

## 2023-10-25 NOTE — Progress Notes (Addendum)
 Phone [phone]   Subjective:   Patient is a 63 y.o. female presenting for annual physical.    Chief Complaint  Patient presents with   Annual Exam    CPE Fasting    Annual-going to the pool for exercise.  Can't walk far d/t OA.   See problem oriented charting- ROS- ROS: Gen: no fever, chills  Skin: no rash, itching ENT: no ear pain, ear drainage, nasal congestion, rhinorrhea, sinus pressure, sore throat Eyes: no blurry vision, double vision Resp: no cough, wheeze,SOB CV: no CP, palpitations, LE edema,  GI: no heartburn, n/v/d/c, abd pain GU: no dysuria, urgency, frequency, hematuria MSK: no joint pain, myalgias, back pain Neuro: no dizziness, headache, weakness, vertigo Psych: no depression, anxiety, insomnia, SI   The following were reviewed and entered/updated in epic: Past Medical History:  Diagnosis Date   Allergy to environmental factors    Anxiety    Follows w/ PCP Dr. Adaline @ [facility].   Arthritis    ankles   Bilateral lower extremity edema 2023   Chronic pain    LLE reflex sympathetic dystrophy, follws with Pain Management, Sherlean New, PA.   COVID-19 03/08/2021   Depression    Follows w/ PCP.   Esophageal varices (HCC)    Follows w/ Stephane Quest, NP @ [facility].   Gallstones    GERD (gastroesophageal reflux disease)    taking Protonix    Headache    stopped after menopause   Hemolytic anemia (HCC)    Follows w/ Dr. Amber Iruku @ [facility], see 06/15/22 OV in Epic, s/p iron infusions   Hepatic encephalopathy (HCC) 10/11/2021   treated w/ lactulose  and rifaximim   History of pneumonia 08/2021   Hospital admission on 09/22/21.   Liver cirrhosis secondary to NASH [facility])    Follows w/ Dr. Elza Raisin @ [facility] and Stephane Quest, NP @ [facility].   Neuromuscular disorder (HCC)    reflex sympathetic dystrophy of LLE, follows w/ pain managemnt. Patient stated she  broke her ankle again and nerve pain resolved.   PMB (postmenopausal bleeding) 2023   Thrombocytopenia (HCC)    Follows w/ Dr. Amber Iruku @ [facility].   Urticaria    Wears glasses    Patient Active Problem List   Diagnosis Date Noted   GAD (generalized anxiety disorder) 08/27/2023   GERD (gastroesophageal reflux disease) 08/27/2023   Hepatic encephalopathy (HCC) 08/26/2023   Metabolic dysfunction-associated steatohepatitis (MASH) 10/20/2022   Morbid obesity (HCC) 10/11/2021   IDA (iron deficiency anemia) 09/29/2021   Gastritis and gastroduodenitis    Benign neoplasm of colon    Dysphagia    CAP (community acquired pneumonia) 09/21/2021   SIRS (systemic inflammatory response syndrome) (HCC) 09/21/2021   Absolute anemia 09/21/2021   Thrombocytopenia (HCC) 09/21/2021   Morbid obesity with BMI of 50.0-59.9, adult (HCC) 09/21/2021   Cirrhosis of liver (HCC) 09/21/2021   Bilateral lower extremity edema 09/21/2021   Tachycardia 09/21/2021   Postoperative state 03/26/2021   Closed trimalleolar fracture of left ankle 03/21/2021   H/O food anaphylaxis 02/12/2020   Adverse food reaction 03/13/2016   Allergic rhinitis 03/13/2016   Chronic pain 03/08/2014   Reflex sympathetic dystrophy of lower extremity 09/13/2013   Depressive disorder 12/23/2012   Past Surgical History:  Procedure Laterality Date   APPENDECTOMY  1979   BIOPSY  09/25/2021   Procedure: BIOPSY;  Surgeon: Leigh Elspeth SQUIBB, MD;  Location: [facility] ENDOSCOPY;  Service:  Gastroenterology;;   BREAST SURGERY  1980   breast reduction   CHOLECYSTECTOMY     as a young adult   COLONOSCOPY WITH PROPOFOL  N/A 09/25/2021   Procedure: COLONOSCOPY WITH PROPOFOL ;  Surgeon: Leigh Elspeth SQUIBB, MD;  Location: [facility] ENDOSCOPY;  Service: Gastroenterology;  Laterality: N/A;   DILATATION & CURETTAGE/HYSTEROSCOPY WITH MYOSURE N/A 06/19/2022   Procedure: DILATATION & CURETTAGE/HYSTEROSCOPY WITH MYOSURE LITE;  Surgeon: Laurence Slater PARAS, MD;  Location:  [facility];  Service: Gynecology;  Laterality: N/A;   DILATATION & CURETTAGE/HYSTEROSCOPY WITH MYOSURE N/A 04/05/2023   Procedure: DILATATION & CURETTAGE/HYSTEROSCOPY WITH MYOSURE LITE;  Surgeon: Laurence Slater PARAS, MD;  Location: [facility];  Service: Gynecology;  Laterality: N/A;   ESOPHAGOGASTRODUODENOSCOPY N/A 09/25/2021   Procedure: ESOPHAGOGASTRODUODENOSCOPY (EGD);  Surgeon: Leigh Elspeth SQUIBB, MD;  Location: [facility] ENDOSCOPY;  Service: Gastroenterology;  Laterality: N/A;   NASAL SEPTUM SURGERY     when patient was in her 54's   ORIF ANKLE FRACTURE Left 03/26/2021   Procedure: OPEN REDUCTION INTERNAL FIXATION (ORIF) ANKLE FRACTURE;  Surgeon: Barton Drape, MD;  Location: [facility] OR;  Service: Orthopedics;  Laterality: Left;   POLYPECTOMY  09/25/2021   Procedure: POLYPECTOMY;  Surgeon: Leigh Elspeth SQUIBB, MD;  Location: [facility] ENDOSCOPY;  Service: Gastroenterology;;    Family History  Problem Relation Age of Onset   Intellectual disability Mother    Hypertension Mother    Hyperlipidemia Mother    Depression Mother    Arthritis Mother    Mental illness Mother    Heart disease Father    Diabetes Father    Stroke Father    Heart disease Brother    COPD Brother    Mental illness Daughter    Learning disabilities Daughter    Diabetes Daughter    Allergic rhinitis Neg Hx    Angioedema Neg Hx    Asthma Neg Hx    Eczema Neg Hx    Immunodeficiency Neg Hx    Urticaria Neg Hx    Colon cancer Neg Hx    Pancreatic cancer Neg Hx    Liver cancer Neg Hx    Stomach cancer Neg Hx     Medications- reviewed and updated Current Outpatient Medications  Medication Sig Dispense Refill   busPIRone  (BUSPAR ) 10 MG tablet Take 1 tablet (10 mg total) by mouth 2 (two) times daily. 180 tablet 1   CALCIUM CITRATE PO Take 1 tablet by mouth at bedtime.     carvedilol  (COREG ) 3.125 MG tablet Take 1 tablet (3.125 mg total) by mouth in the morning and at bedtime. 180 tablet 1    cyanocobalamin  (VITAMIN B12) 1000 MCG tablet Take 1,000 mcg by mouth daily.     diazepam  (VALIUM ) 2 MG tablet Take 1 tab 30 minutes prior to MRI, may add an additional tab if needed 2 tablet 0   EPINEPHrine  0.3 mg/0.3 mL IJ SOAJ injection Inject into the muscle.     furosemide  (LASIX ) 20 MG tablet Take 1 tablet (20 mg total) by mouth 2 (two) times daily as needed. 180 tablet 1   lactulose  (CHRONULAC ) 10 GM/15ML solution Take 30 mLs by mouth 3 (three) times daily. Takes 2 to 3 times daily.     levonorgestrel (MIRENA, 52 MG,) 20 MCG/DAY IUD 1 each by Intrauterine route.     pantoprazole  (PROTONIX ) 40 MG tablet TAKE 1 TABLET(40 MG) BY MOUTH DAILY 90 tablet 3   potassium chloride  SA (KLOR-CON  M) 20 MEQ tablet Take 1 tablet (20 mEq total)  by mouth daily. Only on days take the second lasix  90 tablet 1   rifaximin  (XIFAXAN ) 550 MG TABS tablet Take 550 mg by mouth in the morning and at bedtime.     sertraline  (ZOLOFT ) 50 MG tablet Take 1 tablet (50 mg total) by mouth daily. 90 tablet 1   spironolactone  (ALDACTONE ) 25 MG tablet Take 1 tablet (25 mg total) by mouth daily. 90 tablet 1   tranexamic acid  (LYSTEDA ) 650 MG TABS tablet Take 1,300 mg by mouth 3 (three) times daily as needed.     VITAMIN D PO Take 1 tablet by mouth at bedtime.     baclofen  (LIORESAL ) 20 MG tablet TAKE 1 TABLET(20 MG) BY MOUTH TWICE DAILY 180 tablet 3   No current facility-administered medications for this visit.    Allergies-reviewed and updated Allergies  Allergen Reactions   Shellfish Allergy Anaphylaxis, Hives, Itching, Shortness Of Breath and Swelling   Amitriptyline     rash   Iodine  Hives    Other Reaction(s): Other (See Comments)   Shellfish-Derived Products     Social History   Social History Narrative   Has 1 child(artificial insem), 2 adopted, and 1 step from partner's former partner   Raising 4 grandchildren.   Three story home   Right handed   Caffeine prn   Objective  Objective:  BP 119/64   Pulse  85   Temp 97.7 F (36.5 C) (Temporal)   Resp 16   Ht 5' 4 (1.626 m)   Wt 285 lb 2 oz (129.3 kg)   SpO2 99%   BMI 48.94 kg/m  Physical Exam  Gen: WDWN NAD HEENT: NCAT, conjunctiva not injected, sclera nonicteric TM WNL B, OP moist, no exudates  NECK:  supple, no thyromegaly, no nodes, no carotid bruits CARDIAC: RRR, S1S2+, no murmur. DP 2+B LUNGS: CTAB. No wheezes ABDOMEN:  BS+, soft, NTND, No HSM, no masses EXT:  no edema MSK: no gross abnormalities. MS 5/5 all 4 NEURO: A&O x3.  CN II-XII intact.  PSYCH: normal mood. Good eye contact     Assessment and Plan   Health Maintenance counseling: 1. Anticipatory guidance: Patient counseled regarding regular dental exams q6 months, eye exams,  avoiding smoking and second hand smoke, limiting alcohol to 1 beverage per day, no illicit drugs.   2. Risk factor reduction:  Advised patient of need for regular exercise and diet rich and fruits and vegetables to reduce risk of heart attack and stroke. Exercise- encouraged.  Wt Readings from Last 3 Encounters:  10/25/23 285 lb 2 oz (129.3 kg)  10/01/23 290 lb 8 oz (131.8 kg)  09/03/23 (!) 300 lb 2 oz (136.1 kg)   3. Immunizations/screenings/ancillary studies Immunization History  Administered Date(s) Administered   Fluad Quad(high Dose 65+) 05/08/2022   Hepatitis A, Adult 10/15/2021   Hepatitis B, ADULT 12/04/2021   Hepb-cpg 10/15/2021   Influenza Split 02/28/2013   Influenza, Seasonal, Injecte, Preservative Fre 04/20/2023   Influenza,inj,Quad PF,6+ Mos 03/22/2018, 01/15/2020   Janssen (J&J) SARS-COV-2 Vaccination 08/29/2019   Moderna SARS-COV2 Booster Vaccination 04/28/2020   Tdap 03/22/2018   Health Maintenance Due  Topic Date Due   COVID-19 Vaccine (2 - Janssen risk series) 05/26/2020   Hepatitis B Vaccines (3 of 3 - Risk 3-dose series) 04/17/2022   Medicare Annual Wellness (AWV)  12/14/2023    4. Cervical cancer screening- utd 5. Breast cancer screening-  mammogram utd 6.  Colon cancer screening - utd 7. Skin cancer screening- advised regular sunscreen use.  Denies worrisome, changing, or new skin lesions.  8. Birth control/STD check- n/a 9. Osteoporosis screening- done 06/21/23 10. Smoking associated screening - non smoker  Wellness examination  Wellness-antic guidance.      Recommended follow up: Return for as sch in Nov.  Lab/Order associations:n/a fasting  Jenkins CHRISTELLA Carrel, MD

## 2023-10-25 NOTE — Patient Instructions (Signed)

## 2023-11-01 DIAGNOSIS — W19XXXA Unspecified fall, initial encounter: Secondary | ICD-10-CM | POA: Diagnosis not present

## 2023-11-01 DIAGNOSIS — R0781 Pleurodynia: Secondary | ICD-10-CM | POA: Diagnosis not present

## 2023-11-01 DIAGNOSIS — Z043 Encounter for examination and observation following other accident: Secondary | ICD-10-CM | POA: Diagnosis not present

## 2023-11-01 NOTE — Progress Notes (Signed)
 Theresa Moran is a 63 y.o. with  Active Ambulatory Problems    Diagnosis Date Noted  . Benign neoplasm of colon 12/22/2021  . Chronic pain disorder 03/08/2014  . Liver cirrhosis secondary to NASH (nonalcoholic steatohepatitis)    (CMD) 09/21/2021  . IDA (iron deficiency anemia) 09/29/2021  . Primary biliary cirrhosis    (CMD) 10/11/2021  . Complex regional pain syndrome type 1 of left lower extremity 04/19/2017  . Secondary esophageal varices without bleeding    (CMD) 12/22/2021  . Hepatic encephalopathy    (CMD) 12/22/2021  . NASH (nonalcoholic steatohepatitis) 12/23/2021  . Mild protein-calorie malnutrition (CMD) 12/23/2021  . Metabolic dysfunction-associated steatohepatitis (MASH) 10/20/2022  . GERD (gastroesophageal reflux disease) 08/27/2023  . GAD (generalized anxiety disorder) 08/27/2023   Resolved Ambulatory Problems    Diagnosis Date Noted  . CAP (community acquired pneumonia) 09/21/2021  . Closed trimalleolar fracture of left ankle 03/21/2021  . Depressive disorder 12/23/2012  . Reflex sympathetic dystrophy of lower extremity 09/13/2013  . SIRS (systemic inflammatory response syndrome)    (CMD) 09/21/2021   No Additional Past Medical History   who presents today at Urgent Care for  Chief Complaint  Patient presents with  . Fall    [place], landed on knees and elbow. Felt pain in left rib area, thought it was soft tissue bruising so waited a few days but it has progressively gotten worse. Has hx of cirrhosis and other liver things so can't take a lot of OTC meds.  Has been taking tylenol  2 nights in a row.  Not on any blood thinners. Denies trouble breathing, can move it just hurts     63 year old female seen in clinic due to left rib pain after a fall 2 weeks ago.  She states that she fell on her knees and her left elbow.  She states that she felt some pain at the time that it was all just soft tissue pain however the pain has continued to worsen in the  left torso under her left breast.  She denies any type of shortness of breath, cough, wheezing.  She denies any other symptoms at this time.      reports that she has quit smoking. Her smoking use included cigarettes. She has never used smokeless tobacco.  Review of Systems  Musculoskeletal:        Pain under left breast over the rib.       Physical Exam  BP 108/64   Pulse 88   Temp 97.8 F (36.6 C) (Tympanic)   Resp 18   Ht 1.626 m (5' 4)   Wt 131 kg (289 lb)   SpO2 97%   BMI 49.61 kg/m   Constitutional:      General: Patient is not in acute distress.    Appearance: Normal appearance.  Neurological:     General: No focal deficit present.     Mental Status: alert and oriented to person, place, and time.  Cardiovascular:     Heart sounds: Normal heart sounds. No murmur heard.    No Lower extremity edema noted Pulmonary:     Effort: Pulmonary effort is normal. No respiratory distress.     Breath sounds: No wheezing, rhonchi or rales.  Abdominal:     General: Bowel sounds are normal. There is no distension.     Tenderness: There is no abdominal tenderness.  Musculoskeletal:        General: Normal range of motion.  Neck:  No rigidity.  There is some tenderness to light palpation over the last rib on the left anterior torso.  No other areas of concern at this time. Skin:    General: Skin is warm and dry.   XR Ribs 2 Views Left With Chest Anteroposterior  Final Result by Deward Franky Conn, MD (06/09 0908)  XR RIBS 2 VIEWS LEFT WITH CHEST ANTEROPOSTERIOR, 11/01/2023 9:00 AM    INDICATION: Unspecified fall, initial encounter \ W19.XXXA Unspecified   fall, initial encounter   COMPARISON: None    FINDINGS:   .  Cardiovascular: Cardiac silhouette and pulmonary vasculature are within   normal limits.  .  Mediastinum: Within normal limits.  .  Lungs/pleura: Lungs clear. No pleural effusion. No pneumothorax.  SABRA  Upper abdomen: Visualized portions are unremarkable.  .   Osseous structures: No evidence of rib fracture or lesion.      IMPRESSION:  There is no evidence of acute cardiac or pulmonary abnormality.  No rib fractures or pneumothorax.       Lab Results (last 24 hours)     ** No results found for the last 24 hours. **          DIAGNOSIS/PLAN 1. Injury due to fall, initial encounter (Primary) - XR Ribs 2 Views Left With Chest Anteroposterior     We discussed risks and side effects of medications, and also discussed red flags which would warrant immediate follow-up.     63 year old female seen in clinic due to left rib pain after a fall 2 weeks ago.  She states that she fell on her knees and her left elbow.  She states that she felt some pain at the time that it was all just soft tissue pain however the pain has continued to worsen in the left torso under her left breast.  She denies any type of shortness of breath, cough, wheezing.  She denies any other symptoms at this time.There is some tenderness to light palpation over the last rib on the left anterior torso.  No other areas of concern at this time.  X-rays normal.  Advised on RICE and when to follow-up.

## 2023-11-09 ENCOUNTER — Other Ambulatory Visit: Payer: Self-pay | Admitting: Family Medicine

## 2023-11-17 ENCOUNTER — Other Ambulatory Visit: Payer: Self-pay | Admitting: Family Medicine

## 2023-11-17 ENCOUNTER — Encounter: Payer: Self-pay | Admitting: Family Medicine

## 2023-11-17 DIAGNOSIS — R0602 Shortness of breath: Secondary | ICD-10-CM

## 2023-11-17 DIAGNOSIS — R600 Localized edema: Secondary | ICD-10-CM

## 2023-11-17 MED ORDER — TORSEMIDE 20 MG PO TABS: 20.0000 mg | ORAL_TABLET | Freq: Every day | ORAL | 0 refills | Status: DC

## 2023-11-19 ENCOUNTER — Other Ambulatory Visit (INDEPENDENT_AMBULATORY_CARE_PROVIDER_SITE_OTHER): Payer: Medicare (Managed Care)

## 2023-11-19 ENCOUNTER — Other Ambulatory Visit: Payer: Self-pay | Admitting: *Deleted

## 2023-11-19 DIAGNOSIS — R600 Localized edema: Secondary | ICD-10-CM | POA: Diagnosis not present

## 2023-11-19 DIAGNOSIS — R0602 Shortness of breath: Secondary | ICD-10-CM | POA: Diagnosis not present

## 2023-11-19 LAB — CBC WITH DIFFERENTIAL/PLATELET
Basophils Absolute: 0.1 10*3/uL (ref 0.0–0.1)
Basophils Relative: 1 % (ref 0.0–3.0)
Eosinophils Absolute: 0.2 10*3/uL (ref 0.0–0.7)
Eosinophils Relative: 3 % (ref 0.0–5.0)
HCT: 39.4 % (ref 36.0–46.0)
Hemoglobin: 13 g/dL (ref 12.0–15.0)
Lymphocytes Relative: 24.6 % (ref 12.0–46.0)
Lymphs Abs: 1.4 10*3/uL (ref 0.7–4.0)
MCHC: 33 g/dL (ref 30.0–36.0)
MCV: 90.8 fl (ref 78.0–100.0)
Monocytes Absolute: 0.5 10*3/uL (ref 0.1–1.0)
Monocytes Relative: 9.3 % (ref 3.0–12.0)
Neutro Abs: 3.5 10*3/uL (ref 1.4–7.7)
Neutrophils Relative %: 62.1 % (ref 43.0–77.0)
Platelets: 100 10*3/uL — ABNORMAL LOW (ref 150.0–400.0)
RBC: 4.34 Mil/uL (ref 3.87–5.11)
RDW: 16.6 % — ABNORMAL HIGH (ref 11.5–15.5)
WBC: 5.7 10*3/uL (ref 4.0–10.5)

## 2023-11-19 LAB — TSH: TSH: 1.34 u[IU]/mL (ref 0.35–5.50)

## 2023-11-19 LAB — COMPREHENSIVE METABOLIC PANEL WITH GFR
ALT: 29 U/L (ref 0–35)
AST: 61 U/L — ABNORMAL HIGH (ref 0–37)
Albumin: 3.6 g/dL (ref 3.5–5.2)
Alkaline Phosphatase: 234 U/L — ABNORMAL HIGH (ref 39–117)
BUN: 18 mg/dL (ref 6–23)
CO2: 30 meq/L (ref 19–32)
Calcium: 9.4 mg/dL (ref 8.4–10.5)
Chloride: 106 meq/L (ref 96–112)
Creatinine, Ser: 0.88 mg/dL (ref 0.40–1.20)
GFR: 69.06 mL/min (ref >60.00)
Glucose, Bld: 93 mg/dL (ref 70–99)
Potassium: 4.3 meq/L (ref 3.5–5.1)
Sodium: 141 meq/L (ref 135–145)
Total Bilirubin: 2 mg/dL — ABNORMAL HIGH (ref 0.2–1.2)
Total Protein: 7.2 g/dL (ref 6.0–8.3)

## 2023-11-19 LAB — BRAIN NATRIURETIC PEPTIDE: Pro B Natriuretic peptide (BNP): 44 pg/mL (ref 0.0–100.0)

## 2023-11-21 ENCOUNTER — Ambulatory Visit: Payer: Self-pay | Admitting: Family Medicine

## 2023-11-21 NOTE — Progress Notes (Signed)
 Labs ok/stable except Urine-looks like cancelled Alk phos elevated(u/s liver ok 1 mo ago).  ? From falling and possible fx rib even though x-ray didn't show How is the swelling?  If still there, sch appt this week.  If better, sch 1 moth and will reck labs, etc.

## 2023-12-03 ENCOUNTER — Other Ambulatory Visit: Payer: Self-pay | Admitting: Family Medicine

## 2023-12-09 ENCOUNTER — Other Ambulatory Visit: Payer: Self-pay | Admitting: Family Medicine

## 2023-12-09 NOTE — Telephone Encounter (Signed)
 Patient stated she does not take the lasix , she take the torsemide  one tablet daily. Patient stated that she did not request refill of 90 day supply.

## 2023-12-10 ENCOUNTER — Emergency Department (HOSPITAL_COMMUNITY): Payer: Medicare (Managed Care)

## 2023-12-10 ENCOUNTER — Encounter (HOSPITAL_COMMUNITY): Payer: Self-pay

## 2023-12-10 ENCOUNTER — Emergency Department (HOSPITAL_COMMUNITY)
Admission: EM | Admit: 2023-12-10 | Discharge: 2023-12-11 | Disposition: A | Discharge status: Home / Self Care | Payer: Medicare (Managed Care) | Attending: Emergency Medicine | Admitting: Emergency Medicine

## 2023-12-10 DIAGNOSIS — R600 Localized edema: Secondary | ICD-10-CM | POA: Diagnosis not present

## 2023-12-10 DIAGNOSIS — I959 Hypotension, unspecified: Secondary | ICD-10-CM | POA: Diagnosis not present

## 2023-12-10 DIAGNOSIS — R231 Pallor: Secondary | ICD-10-CM | POA: Diagnosis not present

## 2023-12-10 DIAGNOSIS — R410 Disorientation, unspecified: Secondary | ICD-10-CM | POA: Insufficient documentation

## 2023-12-10 DIAGNOSIS — E860 Dehydration: Secondary | ICD-10-CM

## 2023-12-10 DIAGNOSIS — I951 Orthostatic hypotension: Secondary | ICD-10-CM

## 2023-12-10 DIAGNOSIS — R4182 Altered mental status, unspecified: Secondary | ICD-10-CM | POA: Diagnosis not present

## 2023-12-10 DIAGNOSIS — R42 Dizziness and giddiness: Secondary | ICD-10-CM | POA: Diagnosis not present

## 2023-12-10 DIAGNOSIS — R0902 Hypoxemia: Secondary | ICD-10-CM | POA: Diagnosis not present

## 2023-12-10 DIAGNOSIS — I517 Cardiomegaly: Secondary | ICD-10-CM | POA: Diagnosis not present

## 2023-12-10 LAB — URINALYSIS, W/ REFLEX TO CULTURE (INFECTION SUSPECTED)
Bilirubin Urine: NEGATIVE
Glucose, UA: NEGATIVE mg/dL
Hgb urine dipstick: NEGATIVE
Ketones, ur: NEGATIVE mg/dL
Leukocytes,Ua: NEGATIVE
Nitrite: NEGATIVE
Protein, ur: NEGATIVE mg/dL
Specific Gravity, Urine: 1.013 (ref 1.005–1.030)
pH: 5 (ref 5.0–8.0)

## 2023-12-10 LAB — COMPREHENSIVE METABOLIC PANEL WITH GFR
ALT: 32 U/L (ref 0–44)
AST: 73 U/L — ABNORMAL HIGH (ref 15–41)
Albumin: 3.3 g/dL — ABNORMAL LOW (ref 3.5–5.0)
Alkaline Phosphatase: 227 U/L — ABNORMAL HIGH (ref 38–126)
Anion gap: 10 (ref 5–15)
BUN: 16 mg/dL (ref 8–23)
CO2: 23 mmol/L (ref 22–32)
Calcium: 8.6 mg/dL — ABNORMAL LOW (ref 8.9–10.3)
Chloride: 106 mmol/L (ref 98–111)
Creatinine, Ser: 1.15 mg/dL — ABNORMAL HIGH (ref 0.44–1.00)
GFR, Estimated: 53 mL/min — ABNORMAL LOW (ref >60)
Glucose, Bld: 152 mg/dL — ABNORMAL HIGH (ref 70–99)
Potassium: 4 mmol/L (ref 3.5–5.1)
Sodium: 139 mmol/L (ref 135–145)
Total Bilirubin: 3.1 mg/dL — ABNORMAL HIGH (ref 0.0–1.2)
Total Protein: 7.3 g/dL (ref 6.5–8.1)

## 2023-12-10 LAB — CBC WITH DIFFERENTIAL/PLATELET
Abs Immature Granulocytes: 0.01 K/uL (ref 0.00–0.07)
Basophils Absolute: 0.1 K/uL (ref 0.0–0.1)
Basophils Relative: 1 %
Eosinophils Absolute: 0.1 K/uL (ref 0.0–0.5)
Eosinophils Relative: 1 %
HCT: 39.6 % (ref 36.0–46.0)
Hemoglobin: 12.9 g/dL (ref 12.0–15.0)
Immature Granulocytes: 0 %
Lymphocytes Relative: 15 %
Lymphs Abs: 1 K/uL (ref 0.7–4.0)
MCH: 30.2 pg (ref 26.0–34.0)
MCHC: 32.6 g/dL (ref 30.0–36.0)
MCV: 92.7 fL (ref 80.0–100.0)
Monocytes Absolute: 0.3 K/uL (ref 0.1–1.0)
Monocytes Relative: 5 %
Neutro Abs: 4.8 K/uL (ref 1.7–7.7)
Neutrophils Relative %: 78 %
Platelets: 79 K/uL — ABNORMAL LOW (ref 150–400)
RBC: 4.27 MIL/uL (ref 3.87–5.11)
RDW: 17.4 % — ABNORMAL HIGH (ref 11.5–15.5)
WBC: 6.2 K/uL (ref 4.0–10.5)
nRBC: 0 % (ref 0.0–0.2)

## 2023-12-10 LAB — TROPONIN I (HIGH SENSITIVITY)
Troponin I (High Sensitivity): 6 ng/L (ref <18)
Troponin I (High Sensitivity): 5 ng/L (ref <18)

## 2023-12-10 LAB — BRAIN NATRIURETIC PEPTIDE: B Natriuretic Peptide: 42.1 pg/mL (ref 0.0–100.0)

## 2023-12-10 LAB — AMMONIA: Ammonia: 26 umol/L (ref 9–35)

## 2023-12-10 MED ORDER — SODIUM CHLORIDE 0.9 % IV BOLUS: 1000.0000 mL | Freq: Once | INTRAVENOUS | Status: DC

## 2023-12-10 MED ORDER — SODIUM CHLORIDE 0.9 % IV BOLUS
500.0000 mL | Freq: Once | INTRAVENOUS | Status: AC
  Administered 2023-12-10: 500 mL via INTRAVENOUS

## 2023-12-10 NOTE — ED Provider Notes (Signed)
 [place] EMERGENCY DEPARTMENT AT [facility] Provider Note   CSN: [id number] Arrival date & time: 12/10/23  2024     Patient presents with: No chief complaint on file.   LORAYNE GETCHELL is a 63 y.o. female history of cirrhosis secondary to [facility], chronic thrombocytopenia, chronic pain, here presenting with confusion.  Patient states that she got confused today.  She states that she had previous admission for elevated ammonia and felt similar.  Patient also had UTI at that time.  Patient denies any abdominal pain.  Patient denies any fevers at home.  Denies any urinary symptoms.   HPI     Prior to Admission medications   Medication Sig Start Date End Date Taking? Authorizing Provider  baclofen  (LIORESAL ) 20 MG tablet TAKE 1 TABLET(20 MG) BY MOUTH TWICE DAILY 11/09/23   Wendolyn Jenkins Jansky, MD  busPIRone  (BUSPAR ) 10 MG tablet Take 1 tablet (10 mg total) by mouth 2 (two) times daily. 09/03/23   Wendolyn Jenkins Jansky, MD  CALCIUM CITRATE PO Take 1 tablet by mouth at bedtime.    [provider]  carvedilol  (COREG ) 3.125 MG tablet Take 1 tablet (3.125 mg total) by mouth in the morning and at bedtime. 09/03/23   Wendolyn Jenkins Jansky, MD  cyanocobalamin  (VITAMIN B12) 1000 MCG tablet Take 1,000 mcg by mouth daily.    [provider]  diazepam  (VALIUM ) 2 MG tablet Take 1 tab 30 minutes prior to MRI, may add an additional tab if needed 06/21/23   Wertman, Sara E, PA-C  EPINEPHrine  0.3 mg/0.3 mL IJ SOAJ injection Inject into the muscle.    [provider]  furosemide  (LASIX ) 20 MG tablet Take 1 tablet (20 mg total) by mouth 2 (two) times daily as needed. 09/03/23   Wendolyn Jenkins Jansky, MD  lactulose  (CHRONULAC ) 10 GM/15ML solution Take 30 mLs by mouth 3 (three) times daily. Takes 2 to 3 times daily.    [provider]  levonorgestrel (MIRENA, 52 MG,) 20 MCG/DAY IUD 1 each by Intrauterine route. 04/26/23   [provider]  pantoprazole  (PROTONIX ) 40 MG tablet TAKE 1  TABLET(40 MG) BY MOUTH DAILY 09/03/23   Wendolyn Jenkins Jansky, MD  potassium chloride  SA (KLOR-CON  M) 20 MEQ tablet Take 1 tablet (20 mEq total) by mouth daily. Only on days take the second lasix  09/03/23   Wendolyn Jenkins Jansky, MD  rifaximin  (XIFAXAN ) 550 MG TABS tablet Take 550 mg by mouth in the morning and at bedtime.    [provider]  sertraline  (ZOLOFT ) 50 MG tablet Take 1 tablet (50 mg total) by mouth daily. 10/01/23   Wendolyn Jenkins Jansky, MD  spironolactone  (ALDACTONE ) 25 MG tablet Take 1 tablet (25 mg total) by mouth daily. 09/03/23   Wendolyn Jenkins Jansky, MD  torsemide  (DEMADEX ) 20 MG tablet Take 1 tablet (20 mg total) by mouth daily as needed. Do not take furosemide  if taking torsemide  12/03/23   Wendolyn Jenkins Jansky, MD  tranexamic acid  (LYSTEDA ) 650 MG TABS tablet Take 1,300 mg by mouth 3 (three) times daily as needed. 04/05/23   [provider]  VITAMIN D PO Take 1 tablet by mouth at bedtime.    [provider]    Allergies: Shellfish allergy, Amitriptyline, Iodine , and Shellfish-derived products    Review of Systems  Psychiatric/Behavioral:  Positive for confusion.   All other systems reviewed and are negative.   Updated Vital Signs There were no vitals taken for this visit.  Physical Exam Vitals and nursing  note reviewed.  Constitutional:      Appearance: Normal appearance.     Comments: Slow to respond  HENT:     Head: Normocephalic.     Nose: Nose normal.     Mouth/Throat:     Mouth: Mucous membranes are moist.  Eyes:     Extraocular Movements: Extraocular movements intact.     Pupils: Pupils are equal, round, and reactive to light.  Cardiovascular:     Rate and Rhythm: Normal rate and regular rhythm.     Pulses: Normal pulses.     Heart sounds: Normal heart sounds.  Pulmonary:     Effort: Pulmonary effort is normal.     Breath sounds: Normal breath sounds.  Abdominal:     General: Abdomen is flat.     Palpations: Abdomen is soft.  Musculoskeletal:         General: Normal range of motion.     Cervical back: Normal range of motion and neck supple.     Comments: Trace edema bilaterally, chronic  Skin:    General: Skin is warm.     Capillary Refill: Capillary refill takes less than 2 seconds.  Neurological:     Mental Status: She is alert.     Comments: Normal strength and sensation bilaterally.  No obvious asterixis  Psychiatric:        Mood and Affect: Mood normal.        Behavior: Behavior normal.     (all labs ordered are listed, but only abnormal results are displayed) Labs Reviewed  CBC WITH DIFFERENTIAL/PLATELET  COMPREHENSIVE METABOLIC PANEL WITH GFR  BRAIN NATRIURETIC PEPTIDE  AMMONIA  URINALYSIS, W/ REFLEX TO CULTURE (INFECTION SUSPECTED)  TYPE AND SCREEN  TROPONIN I (HIGH SENSITIVITY)    EKG: None  Radiology: CT Head Wo Contrast Result Date: 12/10/2023 CLINICAL DATA:  Mental status change EXAM: CT HEAD WITHOUT CONTRAST TECHNIQUE: Contiguous axial images were obtained from the base of the skull through the vertex without intravenous contrast. RADIATION DOSE REDUCTION: This exam was performed according to the departmental dose-optimization program which includes automated exposure control, adjustment of the mA and/or kV according to patient size and/or use of iterative reconstruction technique. COMPARISON:  CT brain 08/26/2023 FINDINGS: Brain: No acute territorial infarction, hemorrhage or intracranial mass. The ventricles are nonenlarged. Stable prominent extra-axial CSF densities anteriorly. Vascular: No hyperdense vessels.  No unexpected calcification Skull: Normal. Negative for fracture or focal lesion. Sinuses/Orbits: No acute finding. Other: None IMPRESSION: No CT evidence for acute intracranial abnormality. Electronically Signed   By: Luke Bun M.D.   On: 12/10/2023 21:00     Procedures   Angiocath insertion Performed by: Alm VEAR Cave  Consent: Verbal consent obtained. Risks and benefits: risks, benefits  and alternatives were discussed Time out: Immediately prior to procedure a time out was called to verify the correct patient, procedure, equipment, support staff and site/side marked as required.  Preparation: Patient was prepped and draped in the usual sterile fashion.  Vein Location: R antecube   Ultrasound Guided  Gauge: 20 long   Normal blood return and flush without difficulty Patient tolerance: Patient tolerated the procedure well with no immediate complications.    Medications Ordered in the ED - No data to display  Clinical Course as of 12/10/23 2301  Fri Dec 10, 2023  2256 Stable AMS 76 YOF with vague AMS UTI recently Orthostatic. CXR has mild edema 500CC ambulatory trial and follow cardiac labs [CC]    Clinical Course User Index [  CC] Jerral Meth, MD                                 Medical Decision Making LAKENYA RIENDEAU is a 63 y.o. female here presenting with confusion.  Patient had previous admission for hepatic encephalopathy.  She is slow to respond and I wonder if she has recurrent hepatic encephalopathy.  Also consider electrolyte abnormality versus UTI versus pneumonia.  Plan to get CBC and CMP and ammonia level and UA and chest x-ray and CT head.  10:38 PM I have reviewed patient's labs and LFTs are stable compared to previous.  Ammonia level is normal.  CBC clotted but was able to put ultrasound IV.  Patient is orthostatic and blood pressure dropped to the 80s.  Since patient has baseline leg swelling, I ordered 500 cc bolus.  11:01 PM BMP and troponin is pending.  Signed out to Dr. Jerral to follow-up labs and reassess patient.  Anticipate that if patient is able to ambulate and no longer orthostatic, she can be discharged.  Amount and/or Complexity of Data Reviewed Labs: ordered. Decision-making details documented in ED Course. Radiology: ordered and independent interpretation performed. Decision-making details documented in ED  Course. ECG/medicine tests: ordered and independent interpretation performed. Decision-making details documented in ED Course.     Final diagnoses:  None    ED Discharge Orders     None          Patt Alm Macho, MD 12/10/23 2303

## 2023-12-10 NOTE — ED Triage Notes (Signed)
 PT BIB [facility] for weakness that began around 5P.M. Pt is slow to respond per EMS. PT's states this has happen before when she had a UTI. PT is not complaining of any pain, just feeling tired, lightheaded when getting up and weakness. Pt states she has had hgb issues in the past but hasn't need any transfusions in over 2 years. PT was negative with orthostatic vitals and had a negative stroke scale with EMS.

## 2023-12-10 NOTE — ED Notes (Signed)
 Nurse starting IV and will collect labs.  KM

## 2023-12-11 NOTE — ED Provider Notes (Signed)
 Care of patient received from prior provider at 1:41 AM, please see their note for complete H/P and care plan.  Received handoff per ED course.  Clinical Course as of 12/11/23 0141  Fri Dec 10, 2023  2256 Stable AMS 81 YOF with vague AMS UTI recently Orthostatic. CXR has mild edema 500CC ambulatory trial and follow cardiac labs [CC]    Clinical Course User Index [CC] Jerral Meth, MD    Reassessment: Patient's history of present illness and physical exam findings most consistent with nonspecific etiology.  Orthostasis resolved after IV fluids and all symptoms currently resolved.  She was able to ambulate and blood pressure within normal limits after 500 cc bolus. Recommend she follow-up with her primary care provider in the outpatient setting for ongoing care and management. Offered her observation given recurrent hypotension but patient stated she would rather be discharged and stated a friend would watch her tonight. Strict turn precautions reinforced and patient expressed understanding.     Jerral Meth, MD 12/11/23 [phone]

## 2023-12-20 ENCOUNTER — Ambulatory Visit (INDEPENDENT_AMBULATORY_CARE_PROVIDER_SITE_OTHER): Payer: Medicare (Managed Care)

## 2023-12-20 VITALS — BP 104/62 | HR 88 | Temp 98.7°F | Ht 65.0 in | Wt 275.6 lb

## 2023-12-20 DIAGNOSIS — Z0000 Encounter for general adult medical examination without abnormal findings: Secondary | ICD-10-CM | POA: Diagnosis not present

## 2023-12-20 NOTE — Progress Notes (Signed)
 Subjective:   CARITA SOLLARS is a 63 y.o. who presents for a Medicare Wellness preventive visit.  As a reminder, Annual Wellness Visits don't include a physical exam, and some assessments may be limited, especially if this visit is performed virtually. We may recommend an in-person follow-up visit with your provider if needed.  Visit Complete: In person    Persons Participating in Visit: Patient.  AWV Questionnaire: No: Patient Medicare AWV questionnaire was not completed prior to this visit.  Cardiac Risk Factors include: advanced age (>35men, >81 women);obesity (BMI >30kg/m2)     Objective:    Today's Vitals   12/20/23 0916 12/20/23 0921  BP: 104/62   Pulse: 88   Temp: 98.7 F (37.1 C)   SpO2: 94%   Weight: 275 lb 9.6 oz (125 kg)   Height: 5' 5 (1.651 m)   PainSc:  0-No pain   Body mass index is 45.86 kg/m.     12/20/2023    9:28 AM 08/27/2023    2:07 PM 05/17/2023    9:50 AM 02/24/2023    4:25 PM 12/14/2022   10:43 AM 06/19/2022    5:56 AM 11/11/2021   11:38 AM  Advanced Directives  Does Patient Have a Medical Advance Directive? Yes Yes Yes No Yes Yes Yes  Type of Estate agent of [place];Living will Healthcare Power of [employer] Power of [employer] Power of [place];Living will Healthcare Power of Attorney   Does patient want to make changes to medical advance directive?  No - Patient declined No - Patient declined   No - Patient declined Yes (MAU/Ambulatory/Procedural Areas - Information given)  Copy of Healthcare Power of Attorney in Chart? No - copy requested No - copy requested No - copy requested  No - copy requested No - copy requested   Would patient like information on creating a medical advance directive?    No - Patient declined       Current Medications (verified) Outpatient Encounter Medications as of 12/20/2023  Medication Sig   baclofen  (LIORESAL ) 20 MG tablet TAKE 1 TABLET(20 MG) BY MOUTH TWICE DAILY    busPIRone  (BUSPAR ) 10 MG tablet Take 1 tablet (10 mg total) by mouth 2 (two) times daily.   CALCIUM CITRATE PO Take 1 tablet by mouth at bedtime.   carvedilol  (COREG ) 3.125 MG tablet Take 1 tablet (3.125 mg total) by mouth in the morning and at bedtime.   cyanocobalamin  (VITAMIN B12) 1000 MCG tablet Take 1,000 mcg by mouth daily.   lactulose  (CHRONULAC ) 10 GM/15ML solution Take 30 mLs by mouth 3 (three) times daily. Takes 2 to 3 times daily.   levonorgestrel (MIRENA, 52 MG,) 20 MCG/DAY IUD 1 each by Intrauterine route.   pantoprazole  (PROTONIX ) 40 MG tablet TAKE 1 TABLET(40 MG) BY MOUTH DAILY   potassium chloride  SA (KLOR-CON  M) 20 MEQ tablet Take 1 tablet (20 mEq total) by mouth daily. Only on days take the second lasix    rifaximin  (XIFAXAN ) 550 MG TABS tablet Take 550 mg by mouth in the morning and at bedtime.   sertraline  (ZOLOFT ) 50 MG tablet Take 1 tablet (50 mg total) by mouth daily.   spironolactone  (ALDACTONE ) 25 MG tablet Take 1 tablet (25 mg total) by mouth daily.   torsemide  (DEMADEX ) 20 MG tablet Take 1 tablet (20 mg total) by mouth daily as needed. Do not take furosemide  if taking torsemide    tranexamic acid  (LYSTEDA ) 650 MG TABS tablet Take 1,300 mg by mouth 3 (three)  times daily as needed.   VITAMIN D PO Take 1 tablet by mouth at bedtime.   diazepam  (VALIUM ) 2 MG tablet Take 1 tab 30 minutes prior to MRI, may add an additional tab if needed   EPINEPHrine  0.3 mg/0.3 mL IJ SOAJ injection Inject into the muscle. (Patient not taking: Reported on 12/20/2023)   [DISCONTINUED] furosemide  (LASIX ) 20 MG tablet Take 1 tablet (20 mg total) by mouth 2 (two) times daily as needed.   No facility-administered encounter medications on file as of 12/20/2023.    Allergies (verified) Shellfish allergy, Amitriptyline, Iodine , and Shellfish-derived products   History: Past Medical History:  Diagnosis Date   Allergy to environmental factors    Anxiety    Follows w/ PCP Dr. Adaline @ [facility].   Arthritis    ankles   Bilateral lower extremity edema 2023   Chronic pain    LLE reflex sympathetic dystrophy, follws with Pain Management, Sherlean New, PA.   COVID-19 03/08/2021   Depression    Follows w/ PCP.   Esophageal varices (HCC)    Follows w/ Stephane Quest, NP @ [facility].   Gallstones    GERD (gastroesophageal reflux disease)    taking Protonix    Headache    stopped after menopause   Hemolytic anemia (HCC)    Follows w/ Dr. Amber Iruku @ [facility], see 06/15/22 OV in Epic, s/p iron infusions   Hepatic encephalopathy (HCC) 10/11/2021   treated w/ lactulose  and rifaximim   History of pneumonia 08/2021   Hospital admission on 09/22/21.   Liver cirrhosis secondary to NASH [facility])    Follows w/ Dr. Elza Raisin @ [facility] and Stephane Quest, NP @ [facility].   Neuromuscular disorder (HCC)    reflex sympathetic dystrophy of LLE, follows w/ pain managemnt. Patient stated she broke her ankle again and nerve pain resolved.   PMB (postmenopausal bleeding) 2023   Thrombocytopenia (HCC)    Follows w/ Dr. Amber Iruku @ [facility].   Urticaria    Wears glasses    Past Surgical History:  Procedure Laterality Date   APPENDECTOMY  1979   BIOPSY  09/25/2021   Procedure: BIOPSY;  Surgeon: Leigh Elspeth SQUIBB, MD;  Location: [facility] ENDOSCOPY;  Service: Gastroenterology;;   BREAST SURGERY  1980   breast reduction   CHOLECYSTECTOMY     as a young adult   COLONOSCOPY WITH PROPOFOL  N/A 09/25/2021   Procedure: COLONOSCOPY WITH PROPOFOL ;  Surgeon: Leigh Elspeth SQUIBB, MD;  Location: [facility] ENDOSCOPY;  Service: Gastroenterology;  Laterality: N/A;   DILATATION & CURETTAGE/HYSTEROSCOPY WITH MYOSURE N/A 06/19/2022   Procedure: DILATATION & CURETTAGE/HYSTEROSCOPY WITH MYOSURE LITE;  Surgeon: Laurence Slater PARAS, MD;  Location: [facility];  Service: Gynecology;  Laterality: N/A;   DILATATION &  CURETTAGE/HYSTEROSCOPY WITH MYOSURE N/A 04/05/2023   Procedure: DILATATION & CURETTAGE/HYSTEROSCOPY WITH MYOSURE LITE;  Surgeon: Laurence Slater PARAS, MD;  Location: [facility];  Service: Gynecology;  Laterality: N/A;   ESOPHAGOGASTRODUODENOSCOPY N/A 09/25/2021   Procedure: ESOPHAGOGASTRODUODENOSCOPY (EGD);  Surgeon: Leigh Elspeth SQUIBB, MD;  Location: [facility] ENDOSCOPY;  Service: Gastroenterology;  Laterality: N/A;   NASAL SEPTUM SURGERY     when patient was in her 60's   ORIF ANKLE FRACTURE Left 03/26/2021   Procedure: OPEN REDUCTION INTERNAL FIXATION (ORIF) ANKLE FRACTURE;  Surgeon: Barton Drape, MD;  Location: [facility] OR;  Service: Orthopedics;  Laterality: Left;   POLYPECTOMY  09/25/2021   Procedure: POLYPECTOMY;  Surgeon: Leigh Elspeth SQUIBB, MD;  Location: [facility] ENDOSCOPY;  Service: Gastroenterology;;   Family History  Problem Relation Age of Onset   Intellectual disability Mother    Hypertension Mother    Hyperlipidemia Mother    Depression Mother    Arthritis Mother    Mental illness Mother    Heart disease Father    Diabetes Father    Stroke Father    Heart disease Brother    COPD Brother    Mental illness Daughter    Learning disabilities Daughter    Diabetes Daughter    Allergic rhinitis Neg Hx    Angioedema Neg Hx    Asthma Neg Hx    Eczema Neg Hx    Immunodeficiency Neg Hx    Urticaria Neg Hx    Colon cancer Neg Hx    Pancreatic cancer Neg Hx    Liver cancer Neg Hx    Stomach cancer Neg Hx    Social History   Socioeconomic History   Marital status: Married    Spouse name: Not on file   Number of children: Not on file   Years of education: Not on file   Highest education level: Bachelor's degree (e.g., BA, AB, BS)  Occupational History   Not on file  Tobacco Use   Smoking status: Former    Current packs/day: 0.00    Average packs/day: 1 pack/day for 15.0 years (15.0 ttl pk-yrs)    Types: Cigarettes    Start date: 01/1989    Quit date: 01/2004     Years since quitting: 19.9   Smokeless tobacco: Never  Vaping Use   Vaping status: Never Used  Substance and Sexual Activity   Alcohol use: Not Currently   Drug use: No   Sexual activity: Not on file  Other Topics Concern   Not on file  Social History Narrative   Has 1 child(artificial insem), 2 adopted, and 1 step from partner's former partner   Raising 4 grandchildren.   Three story home   Right handed   Caffeine prn   Social Drivers of Health   Financial Resource Strain: Low Risk  (12/20/2023)   Overall Financial Resource Strain (CARDIA)    Difficulty of Paying Living Expenses: Not hard at all  Food Insecurity: No Food Insecurity (12/20/2023)   Hunger Vital Sign    Worried About Running Out of Food in the Last Year: Never true    Ran Out of Food in the Last Year: Never true  Transportation Needs: No Transportation Needs (12/20/2023)   PRAPARE - Administrator, Civil Service (Medical): No    Lack of Transportation (Non-Medical): No  Physical Activity: Sufficiently Active (12/20/2023)   Exercise Vital Sign    Days of Exercise per Week: 7 days    Minutes of Exercise per Session: 60 min  Stress: No Stress Concern Present (12/20/2023)   [employer] of Occupational Health - Occupational Stress Questionnaire    Feeling of Stress: Not at all  Social Connections: Moderately Isolated (12/20/2023)   Social Connection and Isolation Panel    Frequency of Communication with Friends and Family: More than three times a week    Frequency of Social Gatherings with Friends and Family: More than three times a week    Attends Religious Services: Never    Database administrator or Organizations: No    Attends Banker Meetings: Never    Marital Status: Married    Tobacco Counseling Counseling given: Not  Answered    Clinical Intake:  Pre-visit preparation completed: Yes  Pain : No/denies pain Pain Score: 0-No pain     BMI - recorded: 45.86 Nutritional  Status: BMI > 30  Obese Nutritional Risks: None Diabetes: No  Lab Results  Component Value Date   HGBA1C 5.4 09/06/2023   HGBA1C 5.7 (A) 04/20/2023   HGBA1C 5.5 10/20/2022     How often do you need to have someone help you when you read instructions, pamphlets, or other written materials from your doctor or pharmacy?: 1 - Never  Interpreter Needed?: No  Information entered by :: Ellouise Haws, LPN   Activities of Daily Living     12/20/2023    9:22 AM 08/27/2023    2:07 PM  In your present state of health, do you have any difficulty performing the following activities:  Hearing? 0 0  Vision? 0 0  Difficulty concentrating or making decisions? 0 0  Walking or climbing stairs? 1   Comment at times   Dressing or bathing? 0   Doing errands, shopping? 0 0  Preparing Food and eating ? N   Using the Toilet? N   In the past six months, have you accidently leaked urine? N   Do you have problems with loss of bowel control? N   Managing your Medications? N   Managing your Finances? N   Housekeeping or managing your Housekeeping? N     Patient Care Team: Wendolyn Jenkins Jansky, MD as PCP - General (Family Medicine)  I have updated your Care Teams any recent Medical Services you may have received from other providers in the past year.     Assessment:   This is a routine wellness examination for Sherika.  Hearing/Vision screen Hearing Screening - Comments:: Pt denies any hearing issues  Vision Screening - Comments:: Wears rx glasses - up to date with routine eye exams with Dr Abigail    Goals Addressed             This Visit's Progress    Patient Stated       Maintain health and activity        Depression Screen     12/20/2023    9:24 AM 10/25/2023   10:23 AM 04/20/2023    9:56 AM 12/14/2022   10:41 AM 10/20/2022   10:04 AM 11/11/2021   11:37 AM 08/04/2021    8:50 AM  PHQ 2/9 Scores  PHQ - 2 Score 0 0 0 0 0 1 0  PHQ- 9 Score 0 4 3 0 2  0    Fall Risk     12/20/2023     9:28 AM 05/17/2023    9:49 AM 04/20/2023    9:56 AM 12/14/2022   10:44 AM 10/20/2022   10:03 AM  Fall Risk   Falls in the past year? 1 1 1 1 1   Number falls in past yr: 1 1 1 1 1   Injury with Fall? 1 0 1 1 0  Comment possible rib FX   bruised   Risk for fall due to : History of fall(s);Impaired balance/gait;Impaired mobility  Impaired balance/gait Impaired vision;Impaired mobility;Impaired balance/gait Impaired balance/gait  Follow up Falls prevention discussed Falls evaluation completed Falls prevention discussed Falls prevention discussed Falls prevention discussed    MEDICARE RISK AT HOME:  Medicare Risk at Home Any stairs in or around the home?: Yes If so, are there any without handrails?: No Home free of loose throw rugs in walkways, pet beds,  electrical cords, etc?: Yes Adequate lighting in your home to reduce risk of falls?: Yes Life alert?: No Use of a cane, walker or w/c?: Yes Grab bars in the bathroom?: No Shower chair or bench in shower?: No Elevated toilet seat or a handicapped toilet?: No  TIMED UP AND GO:  Was the test performed?  Yes  Length of time to ambulate 10 feet: 15 sec Gait slow and steady without use of assistive device  Cognitive Function: 6CIT completed      05/17/2023   12:00 PM  Montreal Cognitive Assessment   Visuospatial/ Executive (0/5) 5  Naming (0/3) 3  Attention: Read list of digits (0/2) 1  Attention: Read list of letters (0/1) 1  Attention: Serial 7 subtraction starting at 100 (0/3) 3  Language: Repeat phrase (0/2) 0  Language : Fluency (0/1) 0  Abstraction (0/2) 0  Delayed Recall (0/5) 4  Orientation (0/6) 6  Total 23  Adjusted Score (based on education) 23      12/20/2023    9:29 AM 12/14/2022   10:45 AM 11/11/2021   11:42 AM  6CIT Screen  What Year? 0 points 0 points 0 points  What month? 0 points 0 points 0 points  What time? 0 points 0 points 0 points  Count back from 20 0 points 0 points 0 points  Months in reverse 0  points 0 points 0 points  Repeat phrase 0 points 0 points 0 points  Total Score 0 points 0 points 0 points    Immunizations Immunization History  Administered Date(s) Administered   Fluad Quad(high Dose 65+) 05/08/2022   Hepatitis A, Adult 10/15/2021   Hepatitis B, ADULT 12/04/2021   Hepb-cpg 10/15/2021   Influenza Split 02/28/2013   Influenza, Seasonal, Injecte, Preservative Fre 04/20/2023   Influenza,inj,Quad PF,6+ Mos 03/22/2018, 01/15/2020   Janssen (J&J) SARS-COV-2 Vaccination 08/29/2019   Moderna SARS-COV2 Booster Vaccination 04/28/2020   Tdap 03/22/2018    Screening Tests Health Maintenance  Topic Date Due   COVID-19 Vaccine (2 - Janssen risk series) 05/26/2020   Hepatitis B Vaccines (3 of 3 - Risk 3-dose series) 04/17/2022   Pneumococcal Vaccine: 50+ Years (1 of 2 - PCV) 01/03/2024 (Originally 08/27/1979)   Zoster Vaccines- Shingrix (1 of 2) 01/05/2024 (Originally 08/27/1979)   INFLUENZA VACCINE  12/24/2023   MAMMOGRAM  07/03/2024   Medicare Annual Wellness (AWV)  12/19/2024   Cervical Cancer Screening (HPV/Pap Cotest)  04/03/2027   DTaP/Tdap/Td (2 - Td or Tdap) 03/22/2028   Colonoscopy  09/26/2031   DEXA SCAN  Completed   Hepatitis C Screening  Completed   HIV Screening  Completed   HPV VACCINES  Aged Out   Meningococcal B Vaccine  Aged Out    Health Maintenance  Health Maintenance Due  Topic Date Due   COVID-19 Vaccine (2 - Janssen risk series) 05/26/2020   Hepatitis B Vaccines (3 of 3 - Risk 3-dose series) 04/17/2022   Health Maintenance Items Addressed: See Nurse Notes at the end of this note  Additional Screening:  Vision Screening: Recommended annual ophthalmology exams for early detection of glaucoma and other disorders of the eye. Would you like a referral to an eye doctor? No    Dental Screening: Recommended annual dental exams for proper oral hygiene  Community Resource Referral / Chronic Care Management: CRR required this visit?  No   CCM  required this visit?  No   Plan:    I have personally reviewed and noted the following in the patient's  chart:   Medical and social history Use of alcohol, tobacco or illicit drugs  Current medications and supplements including opioid prescriptions. Patient is not currently taking opioid prescriptions. Functional ability and status Nutritional status Physical activity Advanced directives List of other physicians Hospitalizations, surgeries, and ER visits in previous 12 months Vitals Screenings to include cognitive, depression, and falls Referrals and appointments  In addition, I have reviewed and discussed with patient certain preventive protocols, quality metrics, and best practice recommendations. A written personalized care plan for preventive services as well as general preventive health recommendations were provided to patient.   Ellouise VEAR Haws, LPN   2/71/7974   After Visit Summary: (In Person-Declined) Patient declined AVS at this time.  Notes: Nothing significant to report at this time.

## 2023-12-20 NOTE — Patient Instructions (Signed)
 Theresa Moran , Thank you for taking time out of your busy schedule to complete your Annual Wellness Visit with me. I enjoyed our conversation and look forward to speaking with you again next year. I, as well as your care team,  appreciate your ongoing commitment to your health goals. Please review the following plan we discussed and let me know if I can assist you in the future. Your Game plan/ To Do List    Referrals: If you haven't heard from the office you've been referred to, please reach out to them at the phone provided.   Follow up Visits: Next Medicare AWV with our clinical staff: 12/25/24   Have you seen your provider in the last 6 months (3 months if uncontrolled diabetes)? Yes Next Office Visit with your provider: 12/24/23  Clinician Recommendations:  Aim for 30 minutes of exercise or brisk walking, 6-8 glasses of water , and 5 servings of fruits and vegetables each day.       This is a list of the screening recommended for you and due dates:  Health Maintenance  Topic Date Due   COVID-19 Vaccine (2 - Janssen risk series) 05/26/2020   Hepatitis B Vaccine (3 of 3 - Risk 3-dose series) 04/17/2022   Medicare Annual Wellness Visit  12/14/2023   Pneumococcal Vaccine for age over 43 (1 of 2 - PCV) 01/03/2024*   Zoster (Shingles) Vaccine (1 of 2) 01/05/2024*   Flu Shot  12/24/2023   Mammogram  07/03/2024   Pap with HPV screening  04/03/2027   DTaP/Tdap/Td vaccine (2 - Td or Tdap) 03/22/2028   Colon Cancer Screening  09/26/2031   DEXA scan (bone density measurement)  Completed   Hepatitis C Screening  Completed   HIV Screening  Completed   HPV Vaccine  Aged Out   Meningitis B Vaccine  Aged Out  *Topic was postponed. The date shown is not the original due date.    Advanced directives: (Copy Requested) Please bring a copy of your health care power of attorney and living will to the office to be added to your chart at your convenience. You can mail to [facility] [address]  [address] [place], [place] [address] or email to [email or web address] Advance Care Planning is important because it:  [x]  Makes sure you receive the medical care that is consistent with your values, goals, and preferences  [x]  It provides guidance to your family and loved ones and reduces their decisional burden about whether or not they are making the right decisions based on your wishes.  Follow the link provided in your after visit summary or read over the paperwork we have mailed to you to help you started getting your Advance Directives in place. If you need assistance in completing these, please reach out to us  so that we can help you!  See attachments for Preventive Care and Fall Prevention Tips.

## 2023-12-24 ENCOUNTER — Encounter: Payer: Self-pay | Admitting: Family Medicine

## 2023-12-24 ENCOUNTER — Ambulatory Visit (INDEPENDENT_AMBULATORY_CARE_PROVIDER_SITE_OTHER): Payer: Medicare (Managed Care) | Admitting: Family Medicine

## 2023-12-24 VITALS — BP 113/72 | HR 72 | Temp 98.1°F | Resp 18 | Ht 65.0 in | Wt 271.0 lb

## 2023-12-24 DIAGNOSIS — R7301 Impaired fasting glucose: Secondary | ICD-10-CM | POA: Diagnosis not present

## 2023-12-24 DIAGNOSIS — K7581 Nonalcoholic steatohepatitis (NASH): Secondary | ICD-10-CM | POA: Diagnosis not present

## 2023-12-24 DIAGNOSIS — R600 Localized edema: Secondary | ICD-10-CM | POA: Diagnosis not present

## 2023-12-24 LAB — CBC WITH DIFFERENTIAL/PLATELET
Basophils Absolute: 0.1 K/uL (ref 0.0–0.1)
Basophils Relative: 1.1 % (ref 0.0–3.0)
Eosinophils Absolute: 0.2 K/uL (ref 0.0–0.7)
Eosinophils Relative: 2.7 % (ref 0.0–5.0)
HCT: 38.6 % (ref 36.0–46.0)
Hemoglobin: 12.8 g/dL (ref 12.0–15.0)
Lymphocytes Relative: 29.3 % (ref 12.0–46.0)
Lymphs Abs: 2.2 K/uL (ref 0.7–4.0)
MCHC: 33 g/dL (ref 30.0–36.0)
MCV: 91.9 fl (ref 78.0–100.0)
Monocytes Absolute: 0.9 K/uL (ref 0.1–1.0)
Monocytes Relative: 11.5 % (ref 3.0–12.0)
Neutro Abs: 4.2 K/uL (ref 1.4–7.7)
Neutrophils Relative %: 55.4 % (ref 43.0–77.0)
Platelets: 97 K/uL — ABNORMAL LOW (ref 150.0–400.0)
RBC: 4.21 Mil/uL (ref 3.87–5.11)
RDW: 17.6 % — ABNORMAL HIGH (ref 11.5–15.5)
WBC: 7.5 K/uL (ref 4.0–10.5)

## 2023-12-24 LAB — COMPREHENSIVE METABOLIC PANEL WITH GFR
ALT: 23 U/L (ref 0–35)
AST: 46 U/L — ABNORMAL HIGH (ref 0–37)
Albumin: 3.6 g/dL (ref 3.5–5.2)
Alkaline Phosphatase: 199 U/L — ABNORMAL HIGH (ref 39–117)
BUN: 21 mg/dL (ref 6–23)
CO2: 28 meq/L (ref 19–32)
Calcium: 9 mg/dL (ref 8.4–10.5)
Chloride: 106 meq/L (ref 96–112)
Creatinine, Ser: 1.04 mg/dL (ref 0.40–1.20)
GFR: 56.48 mL/min — ABNORMAL LOW (ref >60.00)
Glucose, Bld: 66 mg/dL — ABNORMAL LOW (ref 70–99)
Potassium: 3.8 meq/L (ref 3.5–5.1)
Sodium: 142 meq/L (ref 135–145)
Total Bilirubin: 2.3 mg/dL — ABNORMAL HIGH (ref 0.2–1.2)
Total Protein: 7.1 g/dL (ref 6.0–8.3)

## 2023-12-24 LAB — HEMOGLOBIN A1C: Hgb A1c MFr Bld: 5.7 % (ref 4.6–6.5)

## 2023-12-24 NOTE — Patient Instructions (Addendum)
 It was very nice to see you today!  Spironolactone  50mg  Tosemide 1/2 in am and 1/2 around lunch.    Wear compression stockings as much as possible  Keep in touch  Echo. Ordered.    PLEASE NOTE:  If you had any lab tests please let us  know if you have not heard back within a few days. You may see your results on MyChart before we have a chance to review them but we will give you a call once they are reviewed by us . If we ordered any referrals today, please let us  know if you have not heard from their office within the next week.   Please try these tips to maintain a healthy lifestyle:  Eat most of your calories during the day when you are active. Eliminate processed foods including packaged sweets (pies, cakes, cookies), reduce intake of potatoes, white bread, white pasta, and white rice. Look for whole grain options, oat flour or almond flour.  Each meal should contain half fruits/vegetables, one quarter protein, and one quarter carbs (no bigger than a computer mouse).  Cut down on sweet beverages. This includes juice, soda, and sweet tea. Also watch fruit intake, though this is a healthier sweet option, it still contains natural sugar! Limit to 3 servings daily.  Drink at least 1 glass of water  with each meal and aim for at least 8 glasses per day  Exercise at least 150 minutes every week.

## 2023-12-24 NOTE — Progress Notes (Signed)
 Subjective:     Patient ID: [name], female    DOB: 1961/01/28, 63 y.o.   MRN: [id number]  Chief Complaint  Patient presents with   Medication Follow-up    Follow-up on medication change     HPI Discussed the use of AI scribe software for clinical note transcription with the patient, who gave verbal consent to proceed.  History of Present Illness GRACELIN WEISBERG is a 63 year old female with liver issues who presents with dizziness, confusion, and leg swelling. She is accompanied by her partner, Lindy.  She has been experiencing dizziness and confusion, which led to a visit to the emergency room where she was treated for dehydration with fluids. Post-treatment, she felt better but remained tired. No vomiting or diarrhea was noted prior to the ER visit, but she has had a lack of appetite, eating mostly at night. She reports constant thirst. She experienced one episode of vertigo after the ER visit, which has not recurred.  Significant leg swelling has been present since June, worsening by the end of the day. No dietary changes, such as increased salt intake, and no swelling in other areas. She switched from furosemide  to torsemide , and reported that the torsemide  seemed to work, particularly in the beginning, as the swelling improved quickly, though swelling persists, especially at night. Compression stockings have been difficult to wear due to heat and pool activities. No shortness of breath or coughing is reported.  Her past medical history includes MAFLD, hepatic encephalopathy, and subnormal platelets. She is scheduled to see her liver specialist every six months, with the next appointment in November. She wears a medical bracelet indicating these conditions along with a shellfish allergy and metal fixation in her left ankle.  She is currently taking torsemide , spironolactone , and carvedilol . She recently doubled her spironolactone  dose to 50 mg. She reports losing weight, which she  attributes to not eating much, and denies using any weight loss medications. Her recent A1c was 5.4 in April, and her blood sugar was slightly elevated at 152 during her ER visit.  She is planning a trip to [place] .    Health Maintenance Due  Topic Date Due   Hepatitis B Vaccines (3 of 3 - Risk 3-dose series) 04/17/2022   INFLUENZA VACCINE  12/24/2023    Past Medical History:  Diagnosis Date   Allergy    Allergy to environmental factors    Anxiety    Follows w/ PCP Dr. Adaline @ [facility].   Arthritis    ankles   Asthma    Bilateral lower extremity edema 2023   Chronic pain    LLE reflex sympathetic dystrophy, follws with Pain Management, Sherlean New, PA.   COVID-19 03/08/2021   Depression    Follows w/ PCP.   Esophageal varices (HCC)    Follows w/ Stephane Quest, NP @ [facility].   Gallstones    GERD (gastroesophageal reflux disease)    taking Protonix    Headache    stopped after menopause   Hemolytic anemia (HCC)    Follows w/ Dr. Amber Iruku @ [facility], see 06/15/22 OV in Epic, s/p iron infusions   Hepatic encephalopathy (HCC) 10/11/2021   treated w/ lactulose  and rifaximim   History of pneumonia 08/2021   Hospital admission on 09/22/21.   Liver cirrhosis secondary to NASH [facility])    Follows w/ Dr. Elza Raisin @ [facility] and Stephane Quest, NP @ [facility].   Neuromuscular disorder (HCC)    reflex sympathetic dystrophy of LLE, follows w/ pain managemnt. Patient stated she broke her ankle again and nerve pain resolved.   PMB (postmenopausal bleeding) 2023   Thrombocytopenia (HCC)    Follows w/ Dr. Amber Iruku @ [facility].   Urticaria    Wears glasses     Past Surgical History:  Procedure Laterality Date   APPENDECTOMY  1979   BIOPSY  09/25/2021   Procedure: BIOPSY;  Surgeon: Leigh Elspeth SQUIBB, MD;  Location: [facility] ENDOSCOPY;  Service: Gastroenterology;;   BREAST SURGERY  1980    breast reduction   CHOLECYSTECTOMY     as a young adult   COLONOSCOPY WITH PROPOFOL  N/A 09/25/2021   Procedure: COLONOSCOPY WITH PROPOFOL ;  Surgeon: Leigh Elspeth SQUIBB, MD;  Location: [facility] ENDOSCOPY;  Service: Gastroenterology;  Laterality: N/A;   DILATATION & CURETTAGE/HYSTEROSCOPY WITH MYOSURE N/A 06/19/2022   Procedure: DILATATION & CURETTAGE/HYSTEROSCOPY WITH MYOSURE LITE;  Surgeon: Laurence Slater PARAS, MD;  Location: [facility];  Service: Gynecology;  Laterality: N/A;   DILATATION & CURETTAGE/HYSTEROSCOPY WITH MYOSURE N/A 04/05/2023   Procedure: DILATATION & CURETTAGE/HYSTEROSCOPY WITH MYOSURE LITE;  Surgeon: Laurence Slater PARAS, MD;  Location: [facility];  Service: Gynecology;  Laterality: N/A;   ESOPHAGOGASTRODUODENOSCOPY N/A 09/25/2021   Procedure: ESOPHAGOGASTRODUODENOSCOPY (EGD);  Surgeon: Leigh Elspeth SQUIBB, MD;  Location: [facility] ENDOSCOPY;  Service: Gastroenterology;  Laterality: N/A;   NASAL SEPTUM SURGERY     when patient was in her 73's   ORIF ANKLE FRACTURE Left 03/26/2021   Procedure: OPEN REDUCTION INTERNAL FIXATION (ORIF) ANKLE FRACTURE;  Surgeon: Barton Drape, MD;  Location: [facility] OR;  Service: Orthopedics;  Laterality: Left;   POLYPECTOMY  09/25/2021   Procedure: POLYPECTOMY;  Surgeon: Leigh Elspeth SQUIBB, MD;  Location: [facility] ENDOSCOPY;  Service: Gastroenterology;;   PROSTATE SURGERY       Current Outpatient Medications:    baclofen  (LIORESAL ) 20 MG tablet, TAKE 1 TABLET(20 MG) BY MOUTH TWICE DAILY, Disp: 180 tablet, Rfl: 3   busPIRone  (BUSPAR ) 10 MG tablet, Take 1 tablet (10 mg total) by mouth 2 (two) times daily., Disp: 180 tablet, Rfl: 1   CALCIUM CITRATE PO, Take 1 tablet by mouth at bedtime., Disp: , Rfl:    carvedilol  (COREG ) 3.125 MG tablet, Take 1 tablet (3.125 mg total) by mouth in the morning and at bedtime., Disp: 180 tablet, Rfl: 1   cyanocobalamin  (VITAMIN B12) 1000 MCG tablet, Take 1,000 mcg by mouth daily., Disp: , Rfl:    diazepam   (VALIUM ) 2 MG tablet, Take 1 tab 30 minutes prior to MRI, may add an additional tab if needed, Disp: 2 tablet, Rfl: 0   EPINEPHrine  0.3 mg/0.3 mL IJ SOAJ injection, Inject into the muscle., Disp: , Rfl:    lactulose  (CHRONULAC ) 10 GM/15ML solution, Take 30 mLs by mouth 3 (three) times daily. Takes 2 to 3 times daily., Disp: , Rfl:    lactulose , encephalopathy, (CHRONULAC ) 10 GM/15ML SOLN, SMARTSIG:Milliliter(s) By Mouth, Disp: , Rfl:    levonorgestrel (MIRENA, 52 MG,) 20 MCG/DAY IUD, 1 each by Intrauterine route., Disp: , Rfl:    pantoprazole  (PROTONIX ) 40 MG tablet, TAKE 1 TABLET(40 MG) BY MOUTH DAILY, Disp: 90 tablet, Rfl: 3   potassium chloride  SA (KLOR-CON  M) 20 MEQ tablet, Take 1 tablet (20 mEq total) by mouth daily. Only on days take the second lasix , Disp: 90 tablet, Rfl: 1   rifaximin  (XIFAXAN ) 550 MG TABS tablet, Take 550 mg by mouth in the  morning and at bedtime., Disp: , Rfl:    sertraline  (ZOLOFT ) 50 MG tablet, Take 1 tablet (50 mg total) by mouth daily., Disp: 90 tablet, Rfl: 1   spironolactone  (ALDACTONE ) 25 MG tablet, Take 1 tablet (25 mg total) by mouth daily., Disp: 90 tablet, Rfl: 1   torsemide  (DEMADEX ) 20 MG tablet, Take 1 tablet (20 mg total) by mouth daily as needed. Do not take furosemide  if taking torsemide , Disp: 30 tablet, Rfl: 0   tranexamic acid  (LYSTEDA ) 650 MG TABS tablet, Take 1,300 mg by mouth 3 (three) times daily as needed., Disp: , Rfl:    VITAMIN D PO, Take 1 tablet by mouth at bedtime., Disp: , Rfl:   Allergies  Allergen Reactions   Shellfish Allergy Anaphylaxis, Hives, Itching, Shortness Of Breath and Swelling   Amitriptyline     rash   Iodine  Hives    Other Reaction(s): Other (See Comments)   Shellfish-Derived Products    ROS neg/noncontributory except as noted HPI/below      Objective:     BP 113/72   Pulse 72   Temp 98.1 F (36.7 C) (Temporal)   Resp 18   Ht 5' 5 (1.651 m)   Wt 271 lb (122.9 kg)   SpO2 97%   BMI 45.10 kg/m  Wt Readings  from Last 3 Encounters:  12/24/23 271 lb (122.9 kg)  12/20/23 275 lb 9.6 oz (125 kg)  12/10/23 286 lb 9.6 oz (130 kg)    Physical Exam   Gen: WDWN NAD HEENT: NCAT, conjunctiva not injected, sclera nonicteric NECK:  supple, no thyromegaly, no nodes, no carotid bruits CARDIAC: RRR, S1S2+, no murmur. DP 2+B LUNGS: CTAB. No wheezes ABDOMEN:  BS+, soft, NTND, No HSM, no masses EXT:  1-2+ non pitting edema MSK: no gross abnormalities.  NEURO: A&O x3.  CN II-XII intact.  PSYCH: normal mood. Good eye contact  Reviewed er records Cxr: IMPRESSION: Hypoventilatory changes with mild cardiomegaly and suspected mild central congestion and interstitial edema.     Assessment & Plan:  Metabolic dysfunction-associated steatohepatitis (MASH) -     CBC with Differential/Platelet -     Comprehensive metabolic panel with GFR -     ECHOCARDIOGRAM COMPLETE; Future  Bilateral lower extremity edema -     CBC with Differential/Platelet -     Comprehensive metabolic panel with GFR -     ECHOCARDIOGRAM COMPLETE; Future  Impaired fasting glucose -     Hemoglobin A1c  Assessment and Plan Assessment & Plan Edema of lower extremities with fluid overload and electrolyte imbalance   Chronic lower extremity edema has worsened since June, with more swelling by day's end. No shortness of breath or cough. Torsemide  is effective, but swelling persists. Nephrotic syndrome is ruled out due to lack of proteinuria. Electrolyte imbalance is suspected from diuretic use and excessive fluid intake. Compliance with compression stockings is difficult due to heat and lifestyle. A recent ER visit for dizziness and confusion may relate to fluid imbalance. Order an echocardiogram to assess heart function. Instruct her to wear compression stockings during the day as much as possible. Adjust torsemide  dosing to half in the morning and half around lunch. Monitor blood pressure at home and encourage electrolyte supplementation with  at least one pack per day. Dry to decrease water  intake some  Cirrhosis of liver with hepatic encephalopathy and thrombocytopenia   Cirrhosis with hepatic encephalopathy and thrombocytopenia is present. A recent ER visit for dizziness and confusion may relate to hepatic encephalopathy. Bilirubin  levels are elevated. Liver enlargement may affect appetite and fluid balance. Thirstiness is noted, possibly from diuretic use or liver condition or DM. Monitor for signs of hepatic encephalopathy and report any changes.   Possible congestive heart failure with heart enlargement and pulmonary congestion noted on imaging. Normal BNP levels create a complex clinical picture. Fluid overload is suspected, possibly related to liver issues or other cardiac conditions. A recent ER visit for dizziness and confusion may relate to heart failure. Order an echocardiogram to evaluate cardiac function. Monitor for symptoms of heart failure, such as increased swelling or confusion. Adjust diuretic therapy as needed based on response and side effects.  Glucose impaired without complications   Glucose impaired is well-controlled with a recent A1c of 5.4 in April. A slightly elevated blood glucose level of 152 was noted during a recent ER visit but may have been stress.  Unintentional weight loss   Unintentional weight loss may be due to decreased appetite and fluid loss. Weight loss is concerning, but she is not on any weight loss medications. Appetite changes may relate to the liver condition or other underlying issues. Monitor weight and appetite changes.    Return for as sch 11/11.  Jenkins CHRISTELLA Carrel, MD

## 2023-12-26 ENCOUNTER — Ambulatory Visit: Payer: Self-pay | Admitting: Family Medicine

## 2023-12-26 DIAGNOSIS — Q2579 Other congenital malformations of pulmonary artery: Secondary | ICD-10-CM

## 2023-12-26 NOTE — Progress Notes (Signed)
 Labs are all stable 2.  Sugar was low-may be from lab processing or may be getting lows at times-needs to eat small amounts for meals even if not hungry-low sugars can cause confusion 3.  Swelling may be just from weight as well(I did check on the pitting vs non-pitting).  Same plan and studies to be done as we discussed.

## 2023-12-30 ENCOUNTER — Encounter: Payer: Self-pay | Admitting: Family Medicine

## 2024-01-08 ENCOUNTER — Other Ambulatory Visit: Payer: Self-pay | Admitting: Family Medicine

## 2024-02-02 ENCOUNTER — Other Ambulatory Visit: Payer: Self-pay | Admitting: Family Medicine

## 2024-02-02 DIAGNOSIS — K7581 Nonalcoholic steatohepatitis (NASH): Secondary | ICD-10-CM

## 2024-02-04 ENCOUNTER — Encounter: Payer: Self-pay | Admitting: Family Medicine

## 2024-02-07 ENCOUNTER — Other Ambulatory Visit: Payer: Self-pay

## 2024-02-07 DIAGNOSIS — K7581 Nonalcoholic steatohepatitis (NASH): Secondary | ICD-10-CM

## 2024-02-07 MED ORDER — SPIRONOLACTONE 25 MG PO TABS: 25.0000 mg | ORAL_TABLET | Freq: Two times a day (BID) | ORAL | 0 refills | Status: DC

## 2024-03-04 ENCOUNTER — Other Ambulatory Visit: Payer: Self-pay | Admitting: Family Medicine

## 2024-03-04 DIAGNOSIS — F411 Generalized anxiety disorder: Secondary | ICD-10-CM

## 2024-03-06 DIAGNOSIS — H353112 Nonexudative age-related macular degeneration, right eye, intermediate dry stage: Secondary | ICD-10-CM | POA: Diagnosis not present

## 2024-03-06 DIAGNOSIS — H353121 Nonexudative age-related macular degeneration, left eye, early dry stage: Secondary | ICD-10-CM | POA: Diagnosis not present

## 2024-03-08 ENCOUNTER — Ambulatory Visit (HOSPITAL_COMMUNITY)
Admission: RE | Admit: 2024-03-08 | Discharge: 2024-03-08 | Disposition: A | Discharge status: Home / Self Care | Payer: Medicare (Managed Care) | Source: Ambulatory Visit | Attending: Cardiovascular Disease | Admitting: Cardiovascular Disease

## 2024-03-08 DIAGNOSIS — R012 Other cardiac sounds: Secondary | ICD-10-CM | POA: Diagnosis not present

## 2024-03-08 DIAGNOSIS — R600 Localized edema: Secondary | ICD-10-CM | POA: Diagnosis not present

## 2024-03-08 DIAGNOSIS — K7581 Nonalcoholic steatohepatitis (NASH): Secondary | ICD-10-CM | POA: Insufficient documentation

## 2024-03-08 LAB — ECHOCARDIOGRAM COMPLETE
Area-P 1/2: 3.87 cm2
S' Lateral: 3 cm

## 2024-03-17 ENCOUNTER — Ambulatory Visit: Payer: Medicare (Managed Care) | Admitting: Psychology

## 2024-03-17 ENCOUNTER — Ambulatory Visit: Payer: Medicare (Managed Care)

## 2024-03-17 ENCOUNTER — Encounter: Payer: Self-pay | Admitting: Psychology

## 2024-03-17 DIAGNOSIS — R4189 Other symptoms and signs involving cognitive functions and awareness: Secondary | ICD-10-CM

## 2024-03-17 DIAGNOSIS — F0670 Mild neurocognitive disorder due to known physiological condition without behavioral disturbance: Secondary | ICD-10-CM | POA: Diagnosis not present

## 2024-03-17 NOTE — Progress Notes (Signed)
   Psychometrician Note   Cognitive testing was administered to [name] by Evalene Pizza, B.S. (psychometrist) under the supervision of Dr. Arthea KYM Maryland, Ph.D., ABPP, licensed psychologist on 03/17/2024. [name] did not appear overtly distressed by the testing session per behavioral observation or responses across self-report questionnaires. Rest breaks were offered.   The battery of tests administered was selected by Dr. Zachary C. Merz, Ph.D., ABPP with consideration to [name]'s current level of functioning, the nature of her symptoms, emotional and behavioral responses during interview, level of literacy, observed level of motivation/effort, and the nature of the referral question. This battery was communicated to the psychometrist. Communication between Dr. Arthea KYM Maryland, Ph.D., ABPP and the psychometrist was ongoing throughout the evaluation and Dr. Arthea KYM Maryland, Ph.D., ABPP was immediately accessible at all times. Dr. Zachary C. Merz, Ph.D., ABPP provided supervision to the psychometrist on the date of this service to the extent necessary to assure the quality of all services provided.    Theresa Moran will return within approximately 1-2 weeks for an interactive feedback session with Dr. Maryland at which time her test performances, clinical impressions, and treatment recommendations will be reviewed in detail. [name] understands she can contact our office should she require our assistance before this time.  A total of 208 minutes of billable time were spent face-to-face with [name] by the psychometrist. This includes both test administration and scoring time. Billing for these services is reflected in the clinical report generated by Dr. Arthea KYM Maryland, Ph.D., ABPP  This note reflects time spent with the psychometrician and does not include test scores or any clinical interpretations made by Dr. Maryland. The full report will follow in a separate note.

## 2024-03-17 NOTE — Progress Notes (Signed)
 NEUROPSYCHOLOGICAL EVALUATION Theresa Moran. [facility] [place] Department of Neurology  Date of Evaluation: March 17, 2024  Reason for Referral:   Theresa Moran is a 63 y.o. right-handed Caucasian female referred by Camie Sevin, PA-C, to characterize her current cognitive functioning and assist with diagnostic clarity and treatment planning in the context of a prior bout of hepatic encephalopathy and concerns for persisting cognitive dysfunction.   Assessment and Plan:   Clinical Impression(s): Theresa Moran's pattern of performance is suggestive of an isolated impairment across verbal fluency (i.e., both phonemic and semantic fluency). Performances across all other assessed cognitive domains were appropriate relative to age-matched peers. This includes processing speed, attention/concentration, executive functioning, receptive language, confrontation naming, visuospatial abilities, and all aspects of learning and memory. Functionally, Theresa Moran largely denied difficulties completing instrumental activities of daily living (ADLs) independently. Her wife was in agreement. 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 However, the very mild nature of this diagnosis should be emphasized presently.   The etiology for some expressive language dysfunction is uncertain at the present time. It is important to highlight that Theresa Moran described an onset of word finding difficulties following her May 2023 hospitalization for hepatic encephalopathy. During this hospitalization, ammonia levels were significantly elevated. Cognitive dysfunction is extremely common with elevated ammonia levels. Unfortunately, given the neurotoxicity of ammonia, these deficits often persist even after the individual has been medically stabilized. There remains the possibility that isolated deficits are directly related  to this medical experience.   Neurologically speaking, isolated and early deficits in verbal fluency can sometimes raise concern for the early stages of a language based neurodegenerative illness. While this could remain on her differential and be monitored over the years to come, there is no current reason to suggest the presence of this type of illness with any degree of confidence. As there is no neuroimaging (i.e., brain MRI) available for review, I am unable to further comment on the likelihood of a vascular contribution or other anatomical signs of illness. Continued medical monitoring will be important moving forward.   Recommendations: It appears that a brain MRI has been ordered but not yet scheduled and that Theresa Moran had previously prescribed a prescription for sedation to assist with this procedure. I would strongly recommend that Theresa Moran schedule and complete this brain scan. It would be extremely helpful if the reading neuroradiologist could specifically examine potential perisylvian atrophy, especially on the left side. Depending on the results of this scan and if there is concern for progressive language decline over time, an FDG-PET scan could also be considered.   An outpatient referral for speech therapy could be beneficial to address deficits in expressive language. She should discuss this with Theresa Moran or her PCP.  A repeat evaluation could be considered if there is concern for progressive expressive language decline, especially the emergence of effortful speech when communicating with others. This repeat evaluation should take place no sooner than 12 months from the current date.  Performance across neurocognitive testing is not a strong predictor of an individual's safety operating a motor vehicle. Should her family wish to pursue a formalized driving evaluation, they could reach out to the following agencies: [employer] in [place]: [phone] [facility]: [phone] [facility]: [phone] [facility]: [phone] or [phone]  Theresa Moran is encouraged to attend to lifestyle factors for brain health (e.g., regular physical exercise,  good nutrition habits and consideration of the MIND-DASH diet, regular participation in cognitively-stimulating activities, and general stress management techniques), which are likely to have benefits for both emotional adjustment and cognition. In fact, in addition to promoting good general health, regular exercise incorporating aerobic activities (e.g., brisk walking, jogging, cycling, etc.) has been demonstrated to be a very effective treatment for depression and stress, with similar efficacy rates to both antidepressant medication and psychotherapy. Optimal control of vascular risk factors (including safe cardiovascular exercise and adherence to dietary recommendations) is encouraged. Continued participation in activities which provide mental stimulation and social interaction is also recommended.   If interested, there are some activities which have therapeutic value and can be useful in keeping her cognitively stimulated. For suggestions, Theresa Moran is encouraged to go to the following website: https://www.[facility].org/get-to-know-barrow/centers-programs/neurorehabilitation-center/neuro-rehab-apps-and-games/ which has smart phone/tablet based options. It should be noted that these activities should not be viewed as a substitute for therapy.  Memory can be improved using internal strategies such as rehearsal, repetition, chunking, mnemonics, association, and imagery. External strategies such as written notes in a consistently used memory journal, visual and nonverbal auditory cues such as a calendar on the refrigerator or appointments with alarm, such as on a cell phone, can also help maximize recall.    To address problems with processing speed, she may wish to consider:    -Ensuring that she is alerted when essential material or instructions are being presented   -Adjusting the speed at which new information is presented   -Allowing for more time in comprehending, processing, and responding in conversation   -Repeating and paraphrasing instructions or conversations aloud  To address problems with fluctuating attention and/or executive dysfunction, she may wish to consider:   -Avoiding external distractions when needing to concentrate   -Limiting exposure to fast paced environments with multiple sensory demands   -Writing down complicated information and using checklists   -Attempting and completing one task at a time (i.e., no multi-tasking)   -Verbalizing aloud each step of a task to maintain focus   -Taking frequent breaks during the completion of steps/tasks to avoid fatigue   -Reducing the amount of information considered at one time   -Scheduling more difficult activities for a time of day where she is usually most alert  Review of Records:   Past Medical History:  Diagnosis Date   Allergic rhinitis 03/13/2016   Arthritis    ankles   Asthma    Benign neoplasm of colon    Bilateral lower extremity edema 2023   CAP (community acquired pneumonia) 09/21/2021   Chronic pain    LLE reflex sympathetic dystrophy, follows with Pain Management, Sherlean New, PA.   Cirrhosis of liver 09/21/2021   Closed trimalleolar fracture of left ankle 03/21/2021   Dysphagia    Esophageal varices    Follows w/ Stephane Quest, NP @ [facility].   Gallstones    Generalized anxiety disorder    Follows w/ PCP Dr. Adaline @ [facility].   GERD (gastroesophageal reflux disease)    taking Protonix    Headache    stopped after menopause   Hemolytic anemia    Follows w/ Dr. Amber Iruku @ [facility], see 06/15/22 OV in Epic, s/p iron infusions   Hepatic encephalopathy 10/11/2021   treated w/ lactulose  and rifaximim   History of COVID-19  03/08/2021   History of food anaphylaxis 02/12/2020   History of pneumonia 08/2021   Hospital admission on 09/22/21.   IDA (  iron deficiency anemia) 09/29/2021   Major depressive disorder 12/23/2012   Metabolic dysfunction-associated steatohepatitis (MASH) 10/20/2022   Morbid obesity 10/11/2021   PMB (postmenopausal bleeding) 2023   Reflex sympathetic dystrophy of lower extremity 09/13/2013   SIRS (systemic inflammatory response syndrome) 09/21/2021   Tachycardia 09/21/2021   Thrombocytopenia    Follows w/ Dr. Amber Iruku @ [facility].   Urticaria    Wears glasses     Past Surgical History:  Procedure Laterality Date   APPENDECTOMY  1979   BIOPSY  09/25/2021   Procedure: BIOPSY;  Surgeon: Leigh Elspeth SQUIBB, MD;  Location: [facility] ENDOSCOPY;  Service: Gastroenterology;;   BREAST SURGERY  1980   breast reduction   CHOLECYSTECTOMY     as a young adult   COLONOSCOPY WITH PROPOFOL  N/A 09/25/2021   Procedure: COLONOSCOPY WITH PROPOFOL ;  Surgeon: Leigh Elspeth SQUIBB, MD;  Location: [facility] ENDOSCOPY;  Service: Gastroenterology;  Laterality: N/A;   DILATATION & CURETTAGE/HYSTEROSCOPY WITH MYOSURE N/A 06/19/2022   Procedure: DILATATION & CURETTAGE/HYSTEROSCOPY WITH MYOSURE LITE;  Surgeon: Laurence Slater PARAS, MD;  Location: [facility];  Service: Gynecology;  Laterality: N/A;   DILATATION & CURETTAGE/HYSTEROSCOPY WITH MYOSURE N/A 04/05/2023   Procedure: DILATATION & CURETTAGE/HYSTEROSCOPY WITH MYOSURE LITE;  Surgeon: Laurence Slater PARAS, MD;  Location: [facility];  Service: Gynecology;  Laterality: N/A;   ESOPHAGOGASTRODUODENOSCOPY N/A 09/25/2021   Procedure: ESOPHAGOGASTRODUODENOSCOPY (EGD);  Surgeon: Leigh Elspeth SQUIBB, MD;  Location: [facility] ENDOSCOPY;  Service: Gastroenterology;  Laterality: N/A;   NASAL SEPTUM SURGERY     when patient was in her 14's   ORIF ANKLE FRACTURE Left 03/26/2021   Procedure: OPEN REDUCTION INTERNAL FIXATION (ORIF) ANKLE FRACTURE;  Surgeon: Barton Drape, MD;  Location: [facility] OR;  Service: Orthopedics;  Laterality: Left;   POLYPECTOMY  09/25/2021   Procedure: POLYPECTOMY;  Surgeon: Leigh Elspeth SQUIBB, MD;  Location: [facility] ENDOSCOPY;  Service: Gastroenterology;;   PROSTATE SURGERY      Current Outpatient Medications:    baclofen  (LIORESAL ) 20 MG tablet, TAKE 1 TABLET(20 MG) BY MOUTH TWICE DAILY, Disp: 180 tablet, Rfl: 3   busPIRone  (BUSPAR ) 10 MG tablet, TAKE 1 TABLET(10 MG) BY MOUTH TWICE DAILY, Disp: 180 tablet, Rfl: 1   CALCIUM CITRATE PO, Take 1 tablet by mouth at bedtime., Disp: , Rfl:    carvedilol  (COREG ) 3.125 MG tablet, Take 1 tablet (3.125 mg total) by mouth in the morning and at bedtime., Disp: 180 tablet, Rfl: 1   cyanocobalamin  (VITAMIN B12) 1000 MCG tablet, Take 1,000 mcg by mouth daily., Disp: , Rfl:    diazepam  (VALIUM ) 2 MG tablet, Take 1 tab 30 minutes prior to MRI, may add an additional tab if needed, Disp: 2 tablet, Rfl: 0   EPINEPHrine  0.3 mg/0.3 mL IJ SOAJ injection, Inject into the muscle., Disp: , Rfl:    lactulose  (CHRONULAC ) 10 GM/15ML solution, Take 30 mLs by mouth 3 (three) times daily. Takes 2 to 3 times daily., Disp: , Rfl:    lactulose , encephalopathy, (CHRONULAC ) 10 GM/15ML SOLN, SMARTSIG:Milliliter(s) By Mouth, Disp: , Rfl:    levonorgestrel (MIRENA, 52 MG,) 20 MCG/DAY IUD, 1 each by Intrauterine route., Disp: , Rfl:    pantoprazole  (PROTONIX ) 40 MG tablet, TAKE 1 TABLET(40 MG) BY MOUTH DAILY, Disp: 90 tablet, Rfl: 3   potassium chloride  SA (KLOR-CON  M) 20 MEQ tablet, Take 1 tablet (20 mEq total) by mouth daily. Only on days take the second lasix , Disp: 90 tablet, Rfl: 1   rifaximin  (XIFAXAN ) 550 MG TABS tablet,  Take 550 mg by mouth in the morning and at bedtime., Disp: , Rfl:    sertraline  (ZOLOFT ) 50 MG tablet, Take 1 tablet (50 mg total) by mouth daily., Disp: 90 tablet, Rfl: 1   spironolactone  (ALDACTONE ) 25 MG tablet, Take 1 tablet (25 mg total) by mouth 2 (two) times daily., Disp: 180 tablet, Rfl: 0    torsemide  (DEMADEX ) 20 MG tablet, TAKE 1 TABLET(20 MG) BY MOUTH DAILY AS NEEDED. DO NOT TAKE FUROSEMIDE  IF TAKING TORSEMIDE , Disp: 90 tablet, Rfl: 0   tranexamic acid  (LYSTEDA ) 650 MG TABS tablet, Take 1,300 mg by mouth 3 (three) times daily as needed., Disp: , Rfl:    VITAMIN D PO, Take 1 tablet by mouth at bedtime., Disp: , Rfl:        05/17/2023   12:00 PM  Montreal Cognitive Assessment   Visuospatial/ Executive (0/5) 5  Naming (0/3) 3  Attention: Read list of digits (0/2) 1  Attention: Read list of letters (0/1) 1  Attention: Serial 7 subtraction starting at 100 (0/3) 3  Language: Repeat phrase (0/2) 0  Language : Fluency (0/1) 0  Abstraction (0/2) 0  Delayed Recall (0/5) 4  Orientation (0/6) 6  Total 23  Adjusted Score (based on education) 23   Neuroimaging: Head CT on 02/24/2023 in the context of a fall was negative. Head CT on 08/26/2023 in the context of altered mental status was negative. Head CT on 12/10/2023 in the context of a ental status change was negative. A brain MRI had been ordered but not yet scheduled at the time of present writing.   Clinical Interview:   The following information was obtained during a clinical interview with Theresa Moran and her wife prior to cognitive testing.  Cognitive Symptoms: Decreased short-term memory: Endorsed. Theresa Moran primarily described trouble recalling details of recent conversations, as well as bouts of confusion or disorientation. She generally denied other generalized memory concerns such as trouble with name recollection or frequently misplacing things in her environment. Her wife was in agreement.  Decreased long-term memory: Denied. Decreased attention/concentration: Unclear. She remarked that it was hard to tell if she noticed any subjective attentional dysregulation.  Reduced processing speed: Endorsed. Difficulties with executive functions: Endorsed. She reported ongoing deficits surrounding organization and multi-tasking.  Her wife noted that Theresa Moran used to be excellent on being on top of things. However, this has quite notably declined. They denied trouble with impulsivity or any prominent personality changes.  Difficulties with emotion regulation: Denied. Difficulties with receptive language: Denied. Difficulties with word finding: Endorsed. Decreased visuoperceptual ability: Endorsed. She was fairly vague but did allude to at least some concerns surrounding depth perception and/or bumping into things in her environment.   Trajectory of deficits: Theresa Moran was admitted to the ED on 10/11/2021 with lethargy and confusion. Ammonia levels were said to be 85. She was diagnosed with hepatic encephalopathy and treated, ultimately being discharged on 10/13/2021. Cognitive dysfunction has been present since the time of her hospitalization and has generally persisted to the present day. Both Theresa Moran and her wife noted a fluctuating course of cognitive dysfunction, noting that difficulties are fairly minimal on good days but impactful on bad days. No cognitive dysfunction was reported prior to this hospitalization.   Difficulties completing ADLs: Largely denied. Theresa Moran remains independent with medication management. She continues to drive without reported difficulty. However, she and her wife have limited her driving to local and familiar settings out of an abundance of caution. Her wife  manages finances and bill paying which is longstanding in nature.   Additional Medical History: History of traumatic brain injury/concussion: Denied. Medical records suggest a fall in October 2024 with some concern for a head impact. However, a loss in consciousness was denied, as were any persisting symptoms. No other head injury concerns were noted.  History of stroke: Denied. History of seizure activity: Denied. History of known exposure to toxins: Denied. Symptoms of chronic pain: Endorsed. She has a history of reflex sympathetic  dystrophy (RDS) stemming from a remote left ankle fracture. This ankle was refractured in 2022. Outside of this, no sources of chronic pain were reported.  Experience of frequent headaches/migraines: Denied. Frequent instances of dizziness/vertigo: Denied.  Sensory changes: She wears glasses with benefit. Other sensory changes/difficulties (e.g., hearing, taste, smell) were denied.  Balance/coordination difficulties: During subjectively bad days, Theresa Moran noted increased instability while ambulating. One side of the body was not said to be worse relative to the other. No recent falls were reported.  Other motor difficulties: She reported upper extremity tremors (L slightly worse than R). These appear to fluctuate rather than be present with any regularity.   Sleep History: Estimated hours obtained each night: Unclear. Theresa Moran described her sleep as erratic in nature.  Difficulties falling asleep: Endorsed. Difficulties can be quite prominent at times, including instances where she may be unable to fall asleep until 3am.  Difficulties staying asleep: Endorsed. Feels rested and refreshed upon awakening: Variably so depending on the quantity and quality of sleep obtained the night before.   History of snoring: Endorsed. History of waking up gasping for air: Denied. Witnessed breath cessation while asleep: Denied.  History of vivid dreaming: Denied. Excessive movement while asleep: Denied. Instances of acting out her dreams: Denied.  Psychiatric/Behavioral Health History: Depression: She described her current mood as neutral with some lows. She acknowledged a history of generally mild depressive experiences. Current medications were said to be helpful. Current or remote suicidal ideation, intent, or plan was denied.  Anxiety: She acknowledged ongoing generalized anxious distress, as well as a history of said symptoms. Medications were said to be helpful at managing symptoms.  Mania:  Denied. Trauma History: Denied. Visual/auditory hallucinations: Denied. Delusional thoughts: Denied.  Tobacco: Denied. Alcohol: She denied current alcohol consumption as well as a history of problematic alcohol abuse or dependence.  Recreational drugs: Denied.  Family History: Problem Relation Age of Onset   Intellectual disability Mother    Hypertension Mother    Hyperlipidemia Mother    Depression Mother    Arthritis Mother    Mental illness Mother    Parkinson's disease Mother    Heart disease Father    Diabetes Father    Stroke Father    Parkinson's disease Brother    Heart disease Brother    COPD Brother    Mental illness Daughter    Learning disabilities Daughter    Diabetes Daughter    Allergic rhinitis Neg Hx    Angioedema Neg Hx    Asthma Neg Hx    Eczema Neg Hx    Immunodeficiency Neg Hx    Urticaria Neg Hx    Colon cancer Neg Hx    Pancreatic cancer Neg Hx    Liver cancer Neg Hx    Stomach cancer Neg Hx    This information was confirmed by Ms. Abran.  Academic/Vocational History: Highest level of educational attainment: 16 years. She graduated from high school and earned a Energy manager degree in physical therapy. She  described herself as a strong (mostly A) student in academic settings. Reading and spelling were noted as relative weaknesses throughout academic settings and she reported being diagnosed with dyslexia.  History of developmental delay: Denied. History of grade repetition: Denied. Enrollment in special education courses: Denied. History of ADHD: Denied.  Employment: Retired. She previously worked as a Engineer, drilling.   Evaluation Results:   Behavioral Observations: Theresa Moran was accompanied by her wife, arrived to her appointment on time, and was appropriately dressed and groomed. She appeared alert. Observed gait and station were within normal limits. Gross motor functioning appeared intact upon informal observation and no  abnormal movements (e.g., tremors) were noted. Her affect was generally relaxed and positive. Spontaneous speech was very mildly effortful at times and word finding difficulties were observed during interview. Thought processes were coherent, organized, and normal in content. Insight into her cognitive difficulties appeared adequate.   During testing, sustained attention was appropriate. Task engagement was adequate and she persisted when challenged. A slight tremor was observed during motorized tasks. Overall, Theresa Moran was cooperative with the clinical interview and subsequent testing procedures.   Adequacy of Effort: The validity of neuropsychological testing is limited by the extent to which the individual being tested may be assumed to have exerted adequate effort during testing. Ms. Gladson expressed her intention to perform to the best of her abilities and exhibited adequate task engagement and persistence. Scores across stand-alone and embedded performance validity measures were within expectation. As such, the results of the current evaluation are believed to be a valid representation of Theresa Moran's current cognitive functioning.  Test Results: Ms. Straka was fully oriented at the time of the current evaluation.  Intellectual abilities based upon educational and vocational attainment were estimated to be in the average range. Premorbid abilities were estimated to be within the average range based upon a single-word reading test. Her FSIQ was also in the average range.   Processing speed was below average to average. Basic attention was average. More complex attention (e.g., working memory) was below average. Executive functioning was average to well above average.  While not directly assessed, receptive language abilities were believed to be intact. Ms. Gudger did not exhibit any difficulties comprehending task instructions and answered all questions asked of her appropriately. Assessed expressive  language was variable. Phonemic fluency was well below average, semantic fluency was well below average to below average, and confrontation naming was average.     Assessed visuospatial/visuoconstructional abilities were average to exceptionally high.    Learning (i.e., encoding) of novel verbal and visual information was mildly variable but overall appropriate, ranging from the below average to above average normative ranges. Spontaneous delayed recall (i.e., retrieval) of previously learned information was also below average to above average. Retention rates were 86% across a list learning task, 97% across a story learning task, 94% across a daily living task, and 75% across a shape learning task. Performance across recognition tasks was appropriate, suggesting evidence for information consolidation.   Results of emotional screening instruments suggested that recent symptoms of generalized anxiety were in the minimal range, while symptoms of depression were within normal limits. A screening instrument assessing recent sleep quality suggested the presence of minimal sleep dysfunction.  Table of Scores:   Note: This summary of test scores accompanies the interpretive report and should not be considered in isolation without reference to the appropriate sections in the text. Descriptors are based on appropriate normative data and may be adjusted based on  clinical judgment. Terms such as Within Normal Limits and Outside Normal Limits are used when a more specific description of the test score cannot be determined.       Percentile - Normative Descriptor > 98 - Exceptionally High 91-97 - Well Above Average 75-90 - Above Average 25-74 - Average 9-24 - Below Average 2-8 - Well Below Average < 2 - Exceptionally Low       Validity:   DESCRIPTOR       ACS WC: --- --- Within Normal Limits  DCT: --- --- Within Normal Limits       Orientation:      Raw Score Percentile   NAB Orientation, Form 1  29/29 --- ---       Cognitive Screening:      Raw Score Percentile   SLUMS: 24/30 --- ---       Intellectual Functioning:      Standard Score Percentile   Test of Premorbid Functioning: 100 50 Average       Wechsler Adult Intelligence Scale (WAIS-5):  Standard Score/ Scaled Score Percentile   Full Scale IQ  101 53 Average  General Abilities Index 108 70 Average    Similarities  7 16 Below Average    Vocabulary 12 75 Above Average    Block Design  10 50 Average    Matrix Reasoning 15 95 Well Above Average    Figure Weights 12 75 Above Average    Digit Sequencing 7 16 Below Average    Coding 8 25 Average       Memory:     NAB Memory Module, Form 1: Standard Score/ T Score Percentile   Total Memory Index 96 39 Average  List Learning       Total Trials 1-3 23/36 (45) 31 Average    List B 4/12 (44) 27 Average    Short Delay Free Recall 7/12 (44) 27 Average    Long Delay Free Recall 6/12 (40) 16 Below Average    Retention Percentage 86 (46) 34 Average    Recognition Discriminability 12 (63) 91 Well Above Average  Shape Learning       Total Trials 1-3 21/27 (62) 88 Above Average    Delayed Recall 6/9 (50) 50 Average    Retention Percentage 75 (43) 25 Average    Recognition Discriminability 5 (42) 21 Below Average  Story Learning       Immediate Recall 57/80 (40) 16 Below Average    Delayed Recall 35/40 (50) 50 Average    Retention Percentage 97 (54) 66 Average  Daily Living Memory       Immediate Recall 43/51 (47) 38 Average    Delayed Recall 16/17 (59) 82 Above Average    Retention Percentage 94 (55) 69 Average    Recognition Hits 10/10 (60) 84 Above Average       Attention/Executive Function:     Trail Making Test (TMT): Raw Score (T Score) Percentile     Part A 42 secs.,  0 errors (41) 18 Below Average    Part B 100 secs.,  0 errors (43) 25 Average         Scaled Score Percentile   WAIS-5 Coding: 8 25 Average  WAIS-5 Naming Speed Quantity: 8 25 Average         Scaled Score Percentile   WAIS-5 Digits Forwards: 9 37 Average  WAIS-5 Digit Sequencing: 7 16 Below Average        Scaled Score Percentile   WAIS-5 Similarities:  7 16 Below Average  WAIS-5 Matrix Reasoning: 15 95 Well Above Average  WAIS-5 Figure Weights: 12 75 Above Average       D-KEFS Verbal Fluency Test: Raw Score (Scaled Score) Percentile     Letter Total Correct 20 (5) 5 Well Below Average    Category Total Correct 24 (6) 9 Below Average    Category Switching Total Correct 11 (8) 25 Average    Category Switching Accuracy 12 (11) 63 Average      Total Set Loss Errors 0 (13) 84 Above Average      Total Repetition Errors 0 (13) 84 Above Average       Language:     Verbal Fluency Test: Raw Score (T Score) Percentile     Phonemic Fluency (FAS) 20 (28) 2 Well Below Average    Animal Fluency 12 (31) 3 Well Below Average        NAB Language Module, Form 1: T Score Percentile     Naming 31/31 (56) 73 Average       Visuospatial/Visuoconstruction:      Raw Score Percentile   Clock Drawing: 10/10 --- Within Normal Limits       NAB Spatial Module, Form 1: T Score Percentile     Figure Drawing Copy 74 99 Exceptionally High        Scaled Score Percentile   WAIS-5 Block Design: 10 50 Average       Mood and Personality:      Raw Score Percentile   Beck Depression Inventory - II: 10 --- Within Normal Limits  PROMIS Anxiety Questionnaire: 13 --- None to Slight       Additional Questionnaires:      Raw Score Percentile   PROMIS Sleep Disturbance Questionnaire: 22 --- None to Slight   Informed Consent and Coding/Compliance:   The current evaluation represents a clinical evaluation for the purposes previously outlined by the referral source and is in no way reflective of a forensic evaluation.   Ms. Salvador was provided with a verbal description of the nature and purpose of the present neuropsychological evaluation. Also reviewed were the foreseeable risks and/or discomforts and  benefits of the procedure, limits of confidentiality, and mandatory reporting requirements of this provider. The patient was given the opportunity to ask questions and receive answers about the evaluation. Oral consent to participate was provided by the patient.   This evaluation was conducted by Arthea KYM Maryland, Ph.D., ABPP-CN, board certified clinical neuropsychologist. Ms. Mis completed a clinical interview with Dr. Maryland, billed as one unit [phone], and 208 minutes of cognitive testing and scoring, billed as one unit [phone] and six additional units 96139. Psychometrist Evalene Pizza, B.S. assisted Dr. Maryland with test administration and scoring procedures. As a separate and discrete service, one unit [phone] and two units 96133 (160 minutes) were billed for Dr. Loralee time spent in interpretation and report writing.

## 2024-03-20 NOTE — Telephone Encounter (Signed)
 Referrals for Cardio and Pulmonary put in.

## 2024-03-20 NOTE — Progress Notes (Signed)
 Echo-possibly some mild pulmonary artery pressure elevation which could be from the study, liver, or lungs.  Refer to cardiology and pulmonary for further eval

## 2024-03-20 NOTE — Addendum Note (Signed)
 Addended by: Kadeen Sroka on: 03/20/2024 10:41 AM   Modules accepted: Orders

## 2024-03-20 NOTE — Addendum Note (Signed)
 Addended by: Ligaya Cormier on: 03/20/2024 10:21 AM   Modules accepted: Orders

## 2024-03-21 ENCOUNTER — Ambulatory Visit: Payer: Medicare (Managed Care) | Admitting: Psychology

## 2024-03-21 DIAGNOSIS — F0670 Mild neurocognitive disorder due to known physiological condition without behavioral disturbance: Secondary | ICD-10-CM

## 2024-03-21 NOTE — Progress Notes (Signed)
   Neuropsychology Feedback Session Theresa DEL. [facility] Department of Neurology  Reason for Referral:   Theresa Moran is a 63 y.o. right-handed Caucasian female referred by Camie Sevin, PA-C, to characterize her current cognitive functioning and assist with diagnostic clarity and treatment planning in the context of a prior bout of hepatic encephalopathy and concerns for persisting cognitive dysfunction.   Feedback:   [name] completed a comprehensive neuropsychological evaluation on 03/17/2024. Please refer to that encounter for the full report and recommendations. Briefly, results suggested an isolated impairment across verbal fluency (i.e., both phonemic and semantic fluency). Performances across all other assessed cognitive domains were appropriate relative to age-matched peers.   The etiology for some expressive language dysfunction is uncertain at the present time. It is important to highlight that [name] described an onset of word finding difficulties following her May 2023 hospitalization for hepatic encephalopathy. During this hospitalization, ammonia levels were significantly elevated. Cognitive dysfunction is extremely common with elevated ammonia levels. Unfortunately, given the neurotoxicity of ammonia, these deficits often persist even after the individual has been medically stabilized. There remains the possibility that isolated deficits are directly related to this medical experience.   [name] was accompanied by her wife during the current feedback session. Content of the current session focused on the results of her neuropsychological evaluation. [name] was given the opportunity to ask questions and her questions were answered. She was encouraged to reach out should additional questions arise. A copy of her report was provided at the conclusion of the visit.      One unit 96132 (34 minutes) was billed for Dr. Loralee time spent preparing for,  conducting, and documenting the current feedback session with [name].

## 2024-03-22 DIAGNOSIS — Z1231 Encounter for screening mammogram for malignant neoplasm of breast: Secondary | ICD-10-CM | POA: Diagnosis not present

## 2024-03-22 DIAGNOSIS — Z01419 Encounter for gynecological examination (general) (routine) without abnormal findings: Secondary | ICD-10-CM | POA: Diagnosis not present

## 2024-03-22 DIAGNOSIS — Z6841 Body Mass Index (BMI) 40.0 and over, adult: Secondary | ICD-10-CM | POA: Diagnosis not present

## 2024-03-24 ENCOUNTER — Encounter: Payer: Medicare (Managed Care) | Admitting: Psychology

## 2024-03-25 ENCOUNTER — Other Ambulatory Visit: Payer: Self-pay | Admitting: Family Medicine

## 2024-03-25 DIAGNOSIS — F411 Generalized anxiety disorder: Secondary | ICD-10-CM

## 2024-04-04 ENCOUNTER — Encounter: Payer: Self-pay | Admitting: Family Medicine

## 2024-04-04 ENCOUNTER — Ambulatory Visit: Payer: Self-pay | Admitting: Family Medicine

## 2024-04-04 ENCOUNTER — Ambulatory Visit (INDEPENDENT_AMBULATORY_CARE_PROVIDER_SITE_OTHER): Payer: Medicare (Managed Care) | Admitting: Family Medicine

## 2024-04-04 VITALS — BP 110/62 | HR 72 | Temp 97.3°F | Ht 65.0 in | Wt 267.0 lb

## 2024-04-04 DIAGNOSIS — Z79899 Other long term (current) drug therapy: Secondary | ICD-10-CM

## 2024-04-04 DIAGNOSIS — Q2579 Other congenital malformations of pulmonary artery: Secondary | ICD-10-CM | POA: Diagnosis not present

## 2024-04-04 DIAGNOSIS — K219 Gastro-esophageal reflux disease without esophagitis: Secondary | ICD-10-CM | POA: Diagnosis not present

## 2024-04-04 DIAGNOSIS — K7469 Other cirrhosis of liver: Secondary | ICD-10-CM | POA: Diagnosis not present

## 2024-04-04 DIAGNOSIS — Z23 Encounter for immunization: Secondary | ICD-10-CM | POA: Diagnosis not present

## 2024-04-04 DIAGNOSIS — F411 Generalized anxiety disorder: Secondary | ICD-10-CM | POA: Diagnosis not present

## 2024-04-04 DIAGNOSIS — D508 Other iron deficiency anemias: Secondary | ICD-10-CM

## 2024-04-04 DIAGNOSIS — R7301 Impaired fasting glucose: Secondary | ICD-10-CM

## 2024-04-04 DIAGNOSIS — I8500 Esophageal varices without bleeding: Secondary | ICD-10-CM

## 2024-04-04 DIAGNOSIS — D696 Thrombocytopenia, unspecified: Secondary | ICD-10-CM

## 2024-04-04 DIAGNOSIS — R600 Localized edema: Secondary | ICD-10-CM | POA: Diagnosis not present

## 2024-04-04 DIAGNOSIS — K7581 Nonalcoholic steatohepatitis (NASH): Secondary | ICD-10-CM | POA: Diagnosis not present

## 2024-04-04 LAB — IBC + FERRITIN
Ferritin: 18.9 ng/mL (ref 10.0–291.0)
Iron: 120 ug/dL (ref 42–145)
Saturation Ratios: 29.7 % (ref 20.0–50.0)
TIBC: 404.6 ug/dL (ref 250.0–450.0)
Transferrin: 289 mg/dL (ref 212.0–360.0)

## 2024-04-04 LAB — LIPID PANEL
Cholesterol: 222 mg/dL — ABNORMAL HIGH (ref 0–200)
HDL: 51.6 mg/dL (ref >39.00)
LDL Cholesterol: 149 mg/dL — ABNORMAL HIGH (ref 0–99)
NonHDL: 169.95
Total CHOL/HDL Ratio: 4
Triglycerides: 107 mg/dL (ref 0.0–149.0)
VLDL: 21.4 mg/dL (ref 0.0–40.0)

## 2024-04-04 LAB — CBC WITH DIFFERENTIAL/PLATELET
Basophils Absolute: 0 K/uL (ref 0.0–0.1)
Basophils Relative: 1 % (ref 0.0–3.0)
Eosinophils Absolute: 0.1 K/uL (ref 0.0–0.7)
Eosinophils Relative: 2.5 % (ref 0.0–5.0)
HCT: 39.5 % (ref 36.0–46.0)
Hemoglobin: 13.1 g/dL (ref 12.0–15.0)
Lymphocytes Relative: 22.8 % (ref 12.0–46.0)
Lymphs Abs: 1.1 K/uL (ref 0.7–4.0)
MCHC: 33.2 g/dL (ref 30.0–36.0)
MCV: 93.7 fl (ref 78.0–100.0)
Monocytes Absolute: 0.4 K/uL (ref 0.1–1.0)
Monocytes Relative: 8.3 % (ref 3.0–12.0)
Neutro Abs: 3 K/uL (ref 1.4–7.7)
Neutrophils Relative %: 65.4 % (ref 43.0–77.0)
Platelets: 78 K/uL — ABNORMAL LOW (ref 150.0–400.0)
RBC: 4.22 Mil/uL (ref 3.87–5.11)
RDW: 16.2 % — ABNORMAL HIGH (ref 11.5–15.5)
WBC: 4.6 K/uL (ref 4.0–10.5)

## 2024-04-04 LAB — COMPREHENSIVE METABOLIC PANEL WITH GFR
ALT: 23 U/L (ref 0–35)
AST: 49 U/L — ABNORMAL HIGH (ref 0–37)
Albumin: 3.6 g/dL (ref 3.5–5.2)
Alkaline Phosphatase: 223 U/L — ABNORMAL HIGH (ref 39–117)
BUN: 21 mg/dL (ref 6–23)
CO2: 31 meq/L (ref 19–32)
Calcium: 8.9 mg/dL (ref 8.4–10.5)
Chloride: 104 meq/L (ref 96–112)
Creatinine, Ser: 0.99 mg/dL (ref 0.40–1.20)
GFR: 59.8 mL/min — ABNORMAL LOW (ref >60.00)
Glucose, Bld: 90 mg/dL (ref 70–99)
Potassium: 4.1 meq/L (ref 3.5–5.1)
Sodium: 143 meq/L (ref 135–145)
Total Bilirubin: 2.3 mg/dL — ABNORMAL HIGH (ref 0.2–1.2)
Total Protein: 6.8 g/dL (ref 6.0–8.3)

## 2024-04-04 LAB — PROTIME-INR
INR: 1.6 ratio — ABNORMAL HIGH (ref 0.8–1.0)
Prothrombin Time: 17.1 s — ABNORMAL HIGH (ref 9.6–13.1)

## 2024-04-04 LAB — VITAMIN B12: Vitamin B-12: 685 pg/mL (ref 211–911)

## 2024-04-04 LAB — MAGNESIUM: Magnesium: 2.2 mg/dL (ref 1.5–2.5)

## 2024-04-04 LAB — HEMOGLOBIN A1C: Hgb A1c MFr Bld: 5.5 % (ref 4.6–6.5)

## 2024-04-04 LAB — GAMMA GT: GGT: 26 U/L (ref 7–51)

## 2024-04-04 MED ORDER — SPIRONOLACTONE 50 MG PO TABS: 50.0000 mg | ORAL_TABLET | Freq: Every day | ORAL | 3 refills | Status: AC

## 2024-04-04 MED ORDER — TORSEMIDE 20 MG PO TABS: 20.0000 mg | ORAL_TABLET | Freq: Every day | ORAL | 1 refills | Status: AC

## 2024-04-04 MED ORDER — CARVEDILOL 3.125 MG PO TABS: 3.1250 mg | ORAL_TABLET | Freq: Two times a day (BID) | ORAL | 1 refills | Status: AC

## 2024-04-04 NOTE — Progress Notes (Signed)
 SABRA

## 2024-04-04 NOTE — Progress Notes (Signed)
 Platelets have decreased some, liver tests still elevated some-but stable.  Cholesterol a little elevated  Need to watch platelets.  Repeat cbcd in 2-3 weeks if liver doctor doesn't repeat.

## 2024-04-04 NOTE — Progress Notes (Signed)
 Subjective:     Patient ID: Theresa Moran, female    DOB: May 22, 1961, 63 y.o.   MRN: [id number]  Chief Complaint  Patient presents with   MASH    Pt is here for 6 month follow up on chronic issues    Discussed the use of AI scribe software for clinical note transcription with the patient, who gave verbal consent to proceed.  History of Present Illness Theresa Moran is a 63 year old female with esophageal varices who presents with follow-up on MAFLD treatment and swelling.  She is currently on MafLD treatment and experiences swelling, which she manages with torsemide  20 mg once daily and spironolactone  50 mg once daily. She has not been taking potassium supplements. She experiences dizziness and lightheadedness, particularly when bending over and standing up, which she attributes to low blood pressure. She wears compression socks daily to manage swelling.  For esophageal varices, she takes carvedilol  twice daily. She notes occasional dizziness and lightheadedness, especially when bending over and standing up, which she attributes to low blood pressure. She is unsure about the need for a follow-up scope with GI but mentions a planned liver scan. She reports her liver has been hurting lately and notes that her alkaline phosphatase levels have been fluctuating-seeing hepatologist.  She is on pantoprazole  for reflux and lactulose  for managing her MAFLD treatment. No shortness of breath, chest pains, vomiting, diarrhea, or constipation, stating she is at her 'level' with lactulose .  She takes Buspar  10 mg, buspirone , and sertraline  50 mg for depression, which she states is 'holding' her moods well. No thoughts of suicide. She reports a history of encephalopathy affecting a small section of her memory but states that everything else is 'good'.  She mentions losing weight intentionally by eating less and reports significant hair loss recently. She has not been taking iron supplements or additional  vitamins due to her current medication load. Thyroid  function was normal in June, and iron studies were last done in April.    There are no preventive care reminders to display for this patient.   Past Medical History:  Diagnosis Date   Allergic rhinitis 03/13/2016   Arthritis    ankles   Asthma    Benign neoplasm of colon    Bilateral lower extremity edema 2023   CAP (community acquired pneumonia) 09/21/2021   Chronic pain    LLE reflex sympathetic dystrophy, follows with Pain Management, Sherlean New, PA.   Cirrhosis of liver 09/21/2021   Closed trimalleolar fracture of left ankle 03/21/2021   Dysphagia    Esophageal varices    Follows w/ Stephane Quest, NP @ [facility].   Gallstones    Generalized anxiety disorder    Follows w/ PCP Dr. Adaline @ [facility].   GERD (gastroesophageal reflux disease)    taking Protonix    Headache    stopped after menopause   Hemolytic anemia    Follows w/ Dr. Amber Iruku @ [facility], see 06/15/22 OV in Epic, s/p iron infusions   Hepatic encephalopathy 10/11/2021   treated w/ lactulose  and rifaximim   History of COVID-19 03/08/2021   History of food anaphylaxis 02/12/2020   History of pneumonia 08/2021   Hospital admission on 09/22/21.   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 Amber Iruku @ [facility].   Urticaria    Wears glasses     Past Surgical History:  Procedure Laterality Date   APPENDECTOMY  1979   BIOPSY  09/25/2021   Procedure: BIOPSY;  Surgeon: Leigh Elspeth SQUIBB, MD;  Location: [facility] ENDOSCOPY;   Service: Gastroenterology;;   BREAST SURGERY  1980   breast reduction   CHOLECYSTECTOMY     as a young adult   COLONOSCOPY WITH PROPOFOL  N/A 09/25/2021   Procedure: COLONOSCOPY WITH PROPOFOL ;  Surgeon: Leigh Elspeth SQUIBB, MD;  Location: [facility] ENDOSCOPY;  Service: Gastroenterology;  Laterality: N/A;   DILATATION & CURETTAGE/HYSTEROSCOPY WITH MYOSURE N/A 06/19/2022   Procedure: DILATATION & CURETTAGE/HYSTEROSCOPY WITH MYOSURE LITE;  Surgeon: Laurence Slater PARAS, MD;  Location: [facility];  Service: Gynecology;  Laterality: N/A;   DILATATION & CURETTAGE/HYSTEROSCOPY WITH MYOSURE N/A 04/05/2023   Procedure: DILATATION & CURETTAGE/HYSTEROSCOPY WITH MYOSURE LITE;  Surgeon: Laurence Slater PARAS, MD;  Location: [facility];  Service: Gynecology;  Laterality: N/A;   ESOPHAGOGASTRODUODENOSCOPY N/A 09/25/2021   Procedure: ESOPHAGOGASTRODUODENOSCOPY (EGD);  Surgeon: Leigh Elspeth SQUIBB, MD;  Location: [facility] ENDOSCOPY;  Service: Gastroenterology;  Laterality: N/A;   NASAL SEPTUM SURGERY     when patient was in her 10's   ORIF ANKLE FRACTURE Left 03/26/2021   Procedure: OPEN REDUCTION INTERNAL FIXATION (ORIF) ANKLE FRACTURE;  Surgeon: Barton Drape, MD;  Location: [facility] OR;  Service: Orthopedics;  Laterality: Left;   POLYPECTOMY  09/25/2021   Procedure: POLYPECTOMY;  Surgeon: Leigh Elspeth SQUIBB, MD;  Location: [facility] ENDOSCOPY;  Service: Gastroenterology;;   PROSTATE SURGERY       Current Outpatient Medications:    baclofen  (LIORESAL ) 20 MG tablet, TAKE 1 TABLET(20 MG) BY MOUTH TWICE DAILY, Disp: 180 tablet, Rfl: 3   busPIRone  (BUSPAR ) 10 MG tablet, TAKE 1 TABLET(10 MG) BY MOUTH TWICE DAILY, Disp: 180 tablet, Rfl: 1   CALCIUM CITRATE PO, Take 1 tablet by mouth at bedtime., Disp: , Rfl:    cyanocobalamin  (VITAMIN B12) 1000 MCG tablet, Take 1,000 mcg by mouth daily., Disp: , Rfl:    EPINEPHrine  0.3 mg/0.3 mL IJ SOAJ injection, Inject into the muscle., Disp: , Rfl:    lactulose ,  encephalopathy, (CHRONULAC ) 10 GM/15ML SOLN, SMARTSIG:Milliliter(s) By Mouth, Disp: , Rfl:    levonorgestrel (MIRENA, 52 MG,) 20 MCG/DAY IUD, 1 each by Intrauterine route., Disp: , Rfl:    pantoprazole  (PROTONIX ) 40 MG tablet, TAKE 1 TABLET(40 MG) BY MOUTH DAILY, Disp: 90 tablet, Rfl: 3   rifaximin  (XIFAXAN ) 550 MG TABS tablet, Take 550 mg by mouth in the morning and at bedtime., Disp: , Rfl:    sertraline  (ZOLOFT ) 50 MG tablet, TAKE 1 TABLET(50 MG) BY MOUTH DAILY, Disp: 90 tablet, Rfl: 1   VITAMIN D PO, Take 1 tablet by mouth at bedtime., Disp: , Rfl:    carvedilol  (COREG ) 3.125 MG tablet, Take 1 tablet (3.125 mg total) by mouth in the morning and at bedtime., Disp: 180 tablet, Rfl: 1   potassium chloride  SA (KLOR-CON  M) 20 MEQ tablet, Take 1 tablet (20 mEq total) by mouth daily. Only on days take the second lasix  (Patient not taking: Reported on 04/04/2024), Disp: 90 tablet, Rfl: 1   spironolactone  (ALDACTONE ) 50 MG tablet, Take 1 tablet (50 mg total) by mouth daily., Disp: 90 tablet, Rfl: 3   torsemide  (DEMADEX ) 20 MG  tablet, Take 1 tablet (20 mg total) by mouth daily., Disp: 90 tablet, Rfl: 1  Allergies  Allergen Reactions   Shellfish Allergy Anaphylaxis, Hives, Itching, Shortness Of Breath and Swelling   Amitriptyline     rash   Iodine  Hives    Other Reaction(s): Other (See Comments)   Shellfish Protein-Containing Drug Products    ROS neg/noncontributory except as noted HPI/below      Objective:     BP 110/62 (BP Location: Left Arm, Patient Position: Sitting, Cuff Size: Large)   Pulse 72   Temp (!) 97.3 F (36.3 C) (Temporal)   Ht 5' 5 (1.651 m)   Wt 267 lb (121.1 kg)   SpO2 98%   BMI 44.43 kg/m  Wt Readings from Last 3 Encounters:  04/04/24 267 lb (121.1 kg)  12/24/23 271 lb (122.9 kg)  12/20/23 275 lb 9.6 oz (125 kg)    Physical Exam GENERAL: Well developed, well nourished, no acute distress. HEAD EYES EARS NOSE THROAT: Normocephalic, atraumatic, conjunctiva not  injected, sclera nonicteric. CARDIAC: Regular rate and rhythm, S1 S2 present, no murmur, dorsalis pedis 2 plus bilaterally. NECK: Supple, no thyromegaly, no nodes, no carotid bruits. LUNGS: Clear to auscultation bilaterally, no wheezes. ABDOMEN: Bowel sounds present, soft, right upper quadrant tenderness, non-distended, no hepatosplenomegaly, no masses. EXTREMITIES: +chronic edema and compression stockings MUSCULOSKELETAL: No gross abnormalities. NEUROLOGICAL: Alert and oriented x3, cranial nerves II through XII intact. PSYCHIATRIC: Normal mood, good eye contact.       Assessment & Plan:  Other iron deficiency anemia -     CBC with Differential/Platelet -     AFP tumor marker -     Gamma GT -     Protime-INR  Other cirrhosis of liver (HCC) -     CBC with Differential/Platelet -     IBC + Ferritin -     AFP tumor marker -     Gamma GT -     Protime-INR  Generalized anxiety disorder  Gastroesophageal reflux disease without esophagitis  Morbid obesity  Metabolic dysfunction-associated steatohepatitis (MASH) -     Carvedilol ; Take 1 tablet (3.125 mg total) by mouth in the morning and at bedtime.  Dispense: 180 tablet; Refill: 1 -     Spironolactone ; Take 1 tablet (50 mg total) by mouth daily.  Dispense: 90 tablet; Refill: 3  Pulmonary artery abnormality  Bilateral lower extremity edema -     Torsemide ; Take 1 tablet (20 mg total) by mouth daily.  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 She reports liver pain and fluctuating alpha fetoprotein levels/alk phos. Discuss potential use of Wegovy instead of Mounjaro . Alpha fetoprotein test ordered.has f/u next wk w/hepatology  Esophageal varices   Managed with carvedilol , but there are concerns about low blood pressure and dizziness due to carvedilol  and torsemide . Evaluate the necessity of carvedilol  given  current fluid management. Discuss this with Dawn and follow up with GI for varices evaluation.  Edema   Managed with torsemide  and spironolactone . Continue torsemide  20 mg daily. Change spironolactone  to 50 mg tablet once daily.  Depression and generalized anxiety disorder   Depression and anxiety are well-managed with buspirone  10 mg and sertraline  50 mg. No suicidal ideation reported. Continue current medications.  Impaired fasting glucose (prediabetes)   Prediabetes status discussed.  Morbid obesity   Weight loss efforts are ongoing. Previous use of Mounjaro  was discontinued due to insurance issues. Discuss potential use of Wegovy for weight management with Dawn.  Pulmonary artery abnormality and potential pulmonary hypertension   Pulmonary artery pressure is slightly elevated. She is scheduled to see a pulmonary specialist on December 1st. Cardiology referral made but not yet contacted. Provided contact information for cardiology and pulmonary specialist. Ensure follow-up with the pulmonary specialist on December 1st.  Iron deficiency (possible anemia)   Hair loss may be related to low ferritin levels. Previous iron studies were conducted in April. No current iron supplementation. Ordered iron studies to assess ferritin levels.  Hepatic encephalopathy (history)   Memory issues are noted but not attributed to depression or Alzheimer's, likely related to hepatic encephalopathy.  Gastroesophageal reflux disease (GERD)   GERD is managed with pantoprazole , and symptoms are  well-controlled. Continue pantoprazole .  General Health Maintenance   Routine health maintenance was discussed, including vaccinations and screenings. Checked cholesterol levels.     Return in about 6 months (around 10/02/2024) for chronic follow-up.  Jenkins CHRISTELLA Carrel, MD

## 2024-04-04 NOTE — Patient Instructions (Addendum)
 It was very nice to see you today!  Ask Dawn about the liver scan and the varicies-follow up. And if [facility] indicated  [facility] [address] [place] [address] [phone]   PLEASE NOTE:  If you had any lab tests please let us  know if you have not heard back within a few days. You may see your results on MyChart before we have a chance to review them but we will give you a call once they are reviewed by us . If we ordered any referrals today, please let us  know if you have not heard from their office within the next week.   Please try these tips to maintain a healthy lifestyle:  Eat most of your calories during the day when you are active. Eliminate processed foods including packaged sweets (pies, cakes, cookies), reduce intake of potatoes, white bread, white pasta, and white rice. Look for whole grain options, oat flour or almond flour.  Each meal should contain half fruits/vegetables, one quarter protein, and one quarter carbs (no bigger than a computer mouse).  Cut down on sweet beverages. This includes juice, soda, and sweet tea. Also watch fruit intake, though this is a healthier sweet option, it still contains natural sugar! Limit to 3 servings daily.  Drink at least 1 glass of water  with each meal and aim for at least 8 glasses per day  Exercise at least 150 minutes every week.

## 2024-04-06 LAB — AFP TUMOR MARKER: AFP-Tumor Marker: 8.8 ng/mL — ABNORMAL HIGH

## 2024-04-07 NOTE — Progress Notes (Signed)
 Her AFP has increased.  I don't know if this is significant or not but definitely have her tell her liver doctor.

## 2024-04-10 ENCOUNTER — Other Ambulatory Visit: Payer: Self-pay | Admitting: Nurse Practitioner

## 2024-04-10 DIAGNOSIS — K7469 Other cirrhosis of liver: Secondary | ICD-10-CM

## 2024-04-10 DIAGNOSIS — K743 Primary biliary cirrhosis: Secondary | ICD-10-CM | POA: Diagnosis not present

## 2024-04-10 DIAGNOSIS — R5383 Other fatigue: Secondary | ICD-10-CM | POA: Diagnosis not present

## 2024-04-10 DIAGNOSIS — I8510 Secondary esophageal varices without bleeding: Secondary | ICD-10-CM

## 2024-04-10 DIAGNOSIS — R1011 Right upper quadrant pain: Secondary | ICD-10-CM | POA: Diagnosis not present

## 2024-04-10 DIAGNOSIS — K7581 Nonalcoholic steatohepatitis (NASH): Secondary | ICD-10-CM

## 2024-04-10 DIAGNOSIS — L659 Nonscarring hair loss, unspecified: Secondary | ICD-10-CM | POA: Diagnosis not present

## 2024-04-10 DIAGNOSIS — K7682 Hepatic encephalopathy: Secondary | ICD-10-CM | POA: Diagnosis not present

## 2024-04-12 ENCOUNTER — Ambulatory Visit: Payer: Medicare (Managed Care) | Admitting: Physician Assistant

## 2024-04-17 ENCOUNTER — Inpatient Hospital Stay: Admission: RE | Admit: 2024-04-17 | Payer: Medicare (Managed Care) | Source: Ambulatory Visit

## 2024-04-24 ENCOUNTER — Ambulatory Visit (HOSPITAL_BASED_OUTPATIENT_CLINIC_OR_DEPARTMENT_OTHER): Payer: Medicare (Managed Care) | Admitting: Pulmonary Disease

## 2024-04-24 ENCOUNTER — Encounter (HOSPITAL_BASED_OUTPATIENT_CLINIC_OR_DEPARTMENT_OTHER): Payer: Self-pay | Admitting: Pulmonary Disease

## 2024-04-24 VITALS — BP 124/56 | HR 91 | Temp 98.0°F | Ht 65.0 in | Wt 266.9 lb

## 2024-04-24 DIAGNOSIS — R0602 Shortness of breath: Secondary | ICD-10-CM | POA: Diagnosis not present

## 2024-04-24 DIAGNOSIS — R0683 Snoring: Secondary | ICD-10-CM | POA: Diagnosis not present

## 2024-04-24 DIAGNOSIS — R931 Abnormal findings on diagnostic imaging of heart and coronary circulation: Secondary | ICD-10-CM | POA: Insufficient documentation

## 2024-04-24 NOTE — Progress Notes (Signed)
 Epworth Sleepiness Scale  Use the following scale to choose the most appropriate number for each situation. 0 Would never nod off 1  Slight  chance of nodding off 2 Moderate chance of nodding off 3 High chance of nodding off  Sitting and reading: 3 Watching TV: 3 Sitting, inactive, in a public place (e.g., in a meeting, theater, or dinner event): 0 As a passenger in a car for an hour or more without stopping for a break: 0 Lying down to rest when circumstances permit:3 Sitting and talking to someone: 0 Sitting quietly after a meal without alcohol: 2 In a car, while stopped for a few  minutes in traffic or at a light: 0  TOTOAL: 11

## 2024-04-24 NOTE — Progress Notes (Signed)
 Subjective:   PATIENT ID: Theresa Moran GENDER: female DOB: 1960-08-29, MRN: [id number]  Chief Complaint  Patient presents with   Consult    Pulmonary artery     Reason for Visit: New consult for abnormality on echocardiogram.  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  She is referred to Pulmonary for pulmonary artery abnormality seen on echocardiogram 03/08/24. She has lower extremity edema and shortness of breath with activity including with cleaning house. Bending over causes dizziness. Feels like difficulty getting air in. Denies wheezing. Reports leg swelling that has worsened in last three years since her cirrhosis diagnosis. Worsens at the end of the day. Wearing compression socks daily with improvement. On torsemide  and spironolactone  daily. Able to walk 100-200 ft without stopping but needs walker for shopping at grocery store. Her wife reports snoring at night for >10 years but no witnessed apnea  Social History: Quit 20 years ago. Smoked 15 years x 1 ppd. Retired pediatric physical therapist  Environmental exposures: None  I have personally reviewed patient's past medical/family/social history, allergies, current medications.  Past Medical History:  Diagnosis Date   Allergic rhinitis 03/13/2016   Arthritis    ankles   Asthma    Benign neoplasm of colon    Bilateral lower extremity edema 2023   CAP (community acquired pneumonia) 09/21/2021   Chronic pain    LLE reflex sympathetic dystrophy, follows with Pain Management, Sherlean New, PA.   Cirrhosis of liver 09/21/2021   Closed trimalleolar fracture of left ankle 03/21/2021   Dysphagia    Esophageal varices    Follows w/ Stephane Quest, NP @ [facility].   Gallstones     Generalized anxiety disorder    Follows w/ PCP Dr. Adaline @ [facility].   GERD (gastroesophageal reflux disease)    taking Protonix    Headache    stopped after menopause   Hemolytic anemia    Follows w/ Dr. Amber Iruku @ [facility], see 06/15/22 OV in Epic, s/p iron infusions   Hepatic encephalopathy 10/11/2021   treated w/ lactulose  and rifaximim   History of COVID-19 03/08/2021   History of food anaphylaxis 02/12/2020   History of pneumonia 08/2021   Hospital admission on 09/22/21.   IDA (iron deficiency anemia) 09/29/2021   Major depressive disorder 12/23/2012   Metabolic dysfunction-associated steatohepatitis (MASH) 10/20/2022   Mild neurocognitive disorder 03/17/2024   Morbid obesity 10/11/2021   PMB (postmenopausal bleeding) 2023   Reflex sympathetic dystrophy of lower extremity 09/13/2013   SIRS (systemic inflammatory response syndrome) 09/21/2021   Tachycardia 09/21/2021   Thrombocytopenia    Follows w/ Dr. Amber Iruku @ [facility].   Urticaria    Wears glasses      Family History  Problem Relation Age of Onset   Intellectual disability Mother    Hypertension Mother    Hyperlipidemia Mother    Depression Mother    Arthritis Mother    Mental illness Mother    Parkinson's disease Mother    Heart disease Father    Diabetes Father    Stroke Father    Parkinson's disease Brother    Heart disease Brother    COPD Brother    Mental illness Daughter    Learning disabilities Daughter    Diabetes Daughter  Allergic rhinitis Neg Hx    Angioedema Neg Hx    Asthma Neg Hx    Eczema Neg Hx    Immunodeficiency Neg Hx    Urticaria Neg Hx    Colon cancer Neg Hx    Pancreatic cancer Neg Hx    Liver cancer Neg Hx    Stomach cancer Neg Hx      Social History   Occupational History   Occupation: Retired    Comment: Pediatric PT provider  Tobacco Use   Smoking status: Former    Current packs/day: 0.00    Average packs/day: 1 pack/day for 15.0 years (15.0 ttl  pk-yrs)    Types: Cigarettes    Start date: 01/1989    Quit date: 01/2004    Years since quitting: 20.2   Smokeless tobacco: Never  Vaping Use   Vaping status: Never Used  Substance and Sexual Activity   Alcohol use: Not Currently   Drug use: No   Sexual activity: Not on file    Allergies  Allergen Reactions   Shellfish Allergy Anaphylaxis, Hives, Itching, Shortness Of Breath and Swelling   Amitriptyline     rash   Iodine  Hives    Other Reaction(s): Other (See Comments)   Shellfish Protein-Containing Drug Products      Outpatient Medications Prior to Visit  Medication Sig Dispense Refill   baclofen  (LIORESAL ) 20 MG tablet TAKE 1 TABLET(20 MG) BY MOUTH TWICE DAILY 180 tablet 3   busPIRone  (BUSPAR ) 10 MG tablet TAKE 1 TABLET(10 MG) BY MOUTH TWICE DAILY 180 tablet 1   carvedilol  (COREG ) 3.125 MG tablet Take 1 tablet (3.125 mg total) by mouth in the morning and at bedtime. 180 tablet 1   EPINEPHrine  0.3 mg/0.3 mL IJ SOAJ injection Inject into the muscle.     lactulose , encephalopathy, (CHRONULAC ) 10 GM/15ML SOLN SMARTSIG:Milliliter(s) By Mouth     levonorgestrel (MIRENA, 52 MG,) 20 MCG/DAY IUD 1 each by Intrauterine route.     pantoprazole  (PROTONIX ) 40 MG tablet TAKE 1 TABLET(40 MG) BY MOUTH DAILY 90 tablet 3   rifaximin  (XIFAXAN ) 550 MG TABS tablet Take 550 mg by mouth in the morning and at bedtime.     sertraline  (ZOLOFT ) 50 MG tablet TAKE 1 TABLET(50 MG) BY MOUTH DAILY 90 tablet 1   spironolactone  (ALDACTONE ) 50 MG tablet Take 1 tablet (50 mg total) by mouth daily. 90 tablet 3   torsemide  (DEMADEX ) 20 MG tablet Take 1 tablet (20 mg total) by mouth daily. 90 tablet 1   CALCIUM CITRATE PO Take 1 tablet by mouth at bedtime. (Patient not taking: Reported on 04/24/2024)     cyanocobalamin  (VITAMIN B12) 1000 MCG tablet Take 1,000 mcg by mouth daily. (Patient not taking: Reported on 04/24/2024)     potassium chloride  SA (KLOR-CON  M) 20 MEQ tablet Take 1 tablet (20 mEq total) by mouth  daily. Only on days take the second lasix  (Patient not taking: Reported on 04/24/2024) 90 tablet 1   VITAMIN D PO Take 1 tablet by mouth at bedtime. (Patient not taking: Reported on 04/24/2024)     No facility-administered medications prior to visit.    Review of Systems  Constitutional:  Negative for chills, diaphoresis, fever, malaise/fatigue and weight loss.  HENT:  Negative for congestion.   Respiratory:  Positive for shortness of breath. Negative for cough, hemoptysis, sputum production and wheezing.   Cardiovascular:  Positive for leg swelling. Negative for chest pain and palpitations.  Neurological:  Positive for dizziness.  Objective:   Vitals:   04/24/24 0935  BP: (!) 124/56  Pulse: 91  Temp: 98 F (36.7 C)  SpO2: 95%  Weight: 266 lb 14.4 oz (121.1 kg)  Height: 5' 5 (1.651 m)   SpO2: 95 %  Physical Exam: General: Well-appearing, no acute distress HENT: [place], AT Eyes: EOMI, no scleral icterus Respiratory: Clear to auscultation bilaterally.  No crackles, wheezing or rales Cardiovascular: RRR, -M/R/G, no JVD Extremities:-Edema,-tenderness Neuro: AAO x4, CNII-XII grossly intact Psych: Normal mood, normal affect  Data Reviewed:  Imaging: CTA 02/24/23 - No PE, normal parenchyma CXR 12/10/23 - Mild cardiomegaly, interstitial edema, hypoventilation  PFT: None on file  Cardiac: TTE 03/08/24 EF 65-70%, no WMA, RVSP size and function normal. Mildly elevated PASP. Left atrium mildly dilated. No significant valvular abnormalities seen  Labs:    Latest Ref Rng & Units 04/04/2024   10:41 AM 12/24/2023    2:10 PM 12/10/2023   10:26 PM  CBC  WBC 4.0 - 10.5 K/uL 4.6  7.5  6.2   Hemoglobin 12.0 - 15.0 g/dL 86.8  87.1  87.0   Hematocrit 36.0 - 46.0 % 39.5  38.6  39.6   Platelets 150.0 - 400.0 K/uL 78.0  97.0  79         Latest Ref Rng & Units 04/04/2024   10:41 AM 12/24/2023    2:10 PM 12/10/2023    8:38 PM  CMP  Glucose 70 - 99 mg/dL 90  66  847   BUN 6 - 23  mg/dL 21  21  16    Creatinine 0.40 - 1.20 mg/dL 9.00  8.95  8.84   Sodium 135 - 145 mEq/L 143  142  139   Potassium 3.5 - 5.1 mEq/L 4.1  3.8  4.0   Chloride 96 - 112 mEq/L 104  106  106   CO2 19 - 32 mEq/L 31  28  23    Calcium 8.4 - 10.5 mg/dL 8.9  9.0  8.6   Total Protein 6.0 - 8.3 g/dL 6.8  7.1  7.3   Total Bilirubin 0.2 - 1.2 mg/dL 2.3  2.3  3.1   Alkaline Phos 39 - 117 U/L 223  199  227   AST 0 - 37 U/L 49  46  73   ALT 0 - 35 U/L 23  23  32     Absolute eos 04/04/24 - 0  TSH BNP ANA    Assessment & Plan:   Discussion: 63 year old female with HTN, HLD, 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  Elevated PASP on echocardiogram can screen for possible pulmonary hypertension but does not confirm. No evidence of heart failure on echo. Normal lung parenchyma on CTA 02/2023. STOP BANG - High risk of OSA (3 points based on snoring, age and BMI. Will rule out secondary causes of possible pulmonary hypertension. If negative work-up can consider right heart cath evaluation. Patient would like to complete pulmonary work-up before    Hold on referral Cardiology for right heart cath  ORDER pulmonary function test ORDER home sleep study. Epworth 11 and STOP Bang -high risk  Health Maintenance Immunization History  Administered Date(s) Administered   Fluad Quad(high Dose 65+) 05/08/2022   Hepatitis A, Adult 10/15/2021   Hepatitis B, ADULT 12/04/2021   Hepb-cpg 10/15/2021   INFLUENZA, HIGH DOSE SEASONAL PF 04/04/2024   Influenza Split 02/28/2013  Influenza, Seasonal, Injecte, Preservative Fre 04/20/2023   Influenza,inj,Quad PF,6+ Mos 03/22/2018, 01/15/2020   Janssen (J&J) SARS-COV-2 Vaccination 08/29/2019   Moderna SARS-COV2 Booster Vaccination 04/28/2020   PNEUMOCOCCAL CONJUGATE-20 04/04/2024   Tdap 03/22/2018   CT Lung  Screen - not qualified  Orders Placed This Encounter  Procedures   Pulmonary function test    Standing Status:   Future    Expiration Date:   04/24/2025    Where should this test be performed?:   Outpatient Pulmonary    What type of PFT is being ordered?:   Full PFT   Home sleep test    Standing Status:   Future    Expiration Date:   04/24/2025    Where should this test be performed::   [facility]  No orders of the defined types were placed in this encounter.   Return for after PFT, with Dr. Kassie.  I have spent a total time of 45-minutes on the day of the appointment reviewing prior documentation, coordinating care and discussing medical diagnosis and plan with the patient/family. Imaging, labs and tests included in this note have been reviewed and interpreted independently by me.  Yulanda Diggs Slater Kassie, MD [facility] 04/24/2024 10:11 AM

## 2024-04-24 NOTE — Patient Instructions (Signed)
 Hold on referral Cardiology for right heart cath  ORDER pulmonary function test ORDER home sleep study

## 2024-04-27 ENCOUNTER — Ambulatory Visit
Admission: RE | Admit: 2024-04-27 | Discharge: 2024-04-27 | Disposition: A | Payer: Medicare (Managed Care) | Source: Ambulatory Visit | Attending: Nurse Practitioner

## 2024-04-27 DIAGNOSIS — I8510 Secondary esophageal varices without bleeding: Secondary | ICD-10-CM

## 2024-04-27 DIAGNOSIS — K7581 Nonalcoholic steatohepatitis (NASH): Secondary | ICD-10-CM

## 2024-04-27 DIAGNOSIS — K743 Primary biliary cirrhosis: Secondary | ICD-10-CM

## 2024-05-11 DIAGNOSIS — G4730 Sleep apnea, unspecified: Secondary | ICD-10-CM | POA: Diagnosis not present

## 2024-05-11 DIAGNOSIS — R0683 Snoring: Secondary | ICD-10-CM

## 2024-05-25 ENCOUNTER — Telehealth: Payer: Self-pay

## 2024-05-25 DIAGNOSIS — G4730 Sleep apnea, unspecified: Secondary | ICD-10-CM | POA: Diagnosis not present

## 2024-05-25 DIAGNOSIS — G4733 Obstructive sleep apnea (adult) (pediatric): Secondary | ICD-10-CM | POA: Diagnosis not present

## 2024-05-25 NOTE — Telephone Encounter (Signed)
 Date of Study: 05/11/24  Interpretation: Mild and Moderate OSA with AHI of 13.4, O2 desaturation 8 min. O2 nadir 80%.   Plan: Initiate auto CPAP at 8-15 cm H2O and provide supplies.  Side sleep.   Sammi Fredericks, MD.

## 2024-05-26 ENCOUNTER — Encounter: Payer: Self-pay | Admitting: Hematology and Oncology

## 2024-05-26 NOTE — Telephone Encounter (Signed)
Order placed for CPAP.

## 2024-05-26 NOTE — Telephone Encounter (Signed)
 Please contact patient regarding sleep study results confirming sleep apnea. Options to treat including a CPAP machine, oral appliance and conservative management. I recommend starting CPAP and patient is agreeable.  Patient wishes to update her insurance. Will send message to front desk staff to update and then send order with CMA for autoCPAP 5-15 cm H20 with supplies. She is a new start

## 2024-05-26 NOTE — Addendum Note (Signed)
 Addended by: TRUDY WARREN CROME on: 05/26/2024 11:59 AM   Modules accepted: Orders

## 2024-06-01 ENCOUNTER — Ambulatory Visit (HOSPITAL_BASED_OUTPATIENT_CLINIC_OR_DEPARTMENT_OTHER): Payer: Self-pay | Admitting: Pulmonary Disease

## 2024-06-01 ENCOUNTER — Other Ambulatory Visit (HOSPITAL_BASED_OUTPATIENT_CLINIC_OR_DEPARTMENT_OTHER): Payer: Self-pay

## 2024-06-01 DIAGNOSIS — G4733 Obstructive sleep apnea (adult) (pediatric): Secondary | ICD-10-CM

## 2024-06-01 NOTE — Progress Notes (Signed)
 Order placed

## 2024-06-07 ENCOUNTER — Encounter: Payer: Self-pay | Admitting: Hematology and Oncology

## 2024-06-14 ENCOUNTER — Ambulatory Visit (HOSPITAL_BASED_OUTPATIENT_CLINIC_OR_DEPARTMENT_OTHER): Payer: Medicare (Managed Care)

## 2024-06-14 ENCOUNTER — Ambulatory Visit (HOSPITAL_BASED_OUTPATIENT_CLINIC_OR_DEPARTMENT_OTHER): Payer: Medicare (Managed Care) | Admitting: Pulmonary Disease

## 2024-06-14 ENCOUNTER — Encounter (HOSPITAL_BASED_OUTPATIENT_CLINIC_OR_DEPARTMENT_OTHER): Payer: Self-pay | Admitting: Pulmonary Disease

## 2024-06-14 VITALS — BP 105/52 | HR 77 | Ht 65.0 in | Wt 268.0 lb

## 2024-06-14 DIAGNOSIS — G8929 Other chronic pain: Secondary | ICD-10-CM | POA: Diagnosis not present

## 2024-06-14 DIAGNOSIS — D696 Thrombocytopenia, unspecified: Secondary | ICD-10-CM | POA: Diagnosis not present

## 2024-06-14 DIAGNOSIS — K219 Gastro-esophageal reflux disease without esophagitis: Secondary | ICD-10-CM | POA: Diagnosis not present

## 2024-06-14 DIAGNOSIS — Z87891 Personal history of nicotine dependence: Secondary | ICD-10-CM | POA: Diagnosis not present

## 2024-06-14 DIAGNOSIS — I10 Essential (primary) hypertension: Secondary | ICD-10-CM

## 2024-06-14 DIAGNOSIS — E785 Hyperlipidemia, unspecified: Secondary | ICD-10-CM

## 2024-06-14 DIAGNOSIS — G4733 Obstructive sleep apnea (adult) (pediatric): Secondary | ICD-10-CM

## 2024-06-14 DIAGNOSIS — R931 Abnormal findings on diagnostic imaging of heart and coronary circulation: Secondary | ICD-10-CM

## 2024-06-14 DIAGNOSIS — R0602 Shortness of breath: Secondary | ICD-10-CM

## 2024-06-14 LAB — PULMONARY FUNCTION TEST
DL/VA % pred: 114 %
DL/VA: 4.73 ml/min/mmHg/L
DLCO unc % pred: 97 %
DLCO unc: 19.9 ml/min/mmHg
FEF 25-75 Post: 2.56 L/s
FEF 25-75 Pre: 1.92 L/s
FEF2575-%Change-Post: 33 %
FEF2575-%Pred-Post: 117 %
FEF2575-%Pred-Pre: 87 %
FEV1-%Change-Post: 7 %
FEV1-%Pred-Post: 90 %
FEV1-%Pred-Pre: 84 %
FEV1-Post: 2.29 L
FEV1-Pre: 2.12 L
FEV1FVC-%Change-Post: 0 %
FEV1FVC-%Pred-Pre: 104 %
FEV6-%Change-Post: 8 %
FEV6-%Pred-Post: 90 %
FEV6-%Pred-Pre: 83 %
FEV6-Post: 2.87 L
FEV6-Pre: 2.64 L
FEV6FVC-%Pred-Post: 104 %
FEV6FVC-%Pred-Pre: 104 %
FVC-%Change-Post: 8 %
FVC-%Pred-Post: 87 %
FVC-%Pred-Pre: 80 %
FVC-Post: 2.87 L
FVC-Pre: 2.64 L
Post FEV1/FVC ratio: 80 %
Post FEV6/FVC ratio: 100 %
Pre FEV1/FVC ratio: 80 %
Pre FEV6/FVC Ratio: 100 %
RV % pred: 117 %
RV: 2.51 L
TLC % pred: 98 %
TLC: 5.14 L

## 2024-06-14 NOTE — Progress Notes (Signed)
 Full PFT performed today.

## 2024-06-14 NOTE — Patient Instructions (Signed)
 Full PFT performed today.

## 2024-06-14 NOTE — Progress Notes (Signed)
 "   Subjective:   PATIENT ID: Theresa Moran GENDER: female DOB: 03/08/1961, MRN: [id number]  Chief Complaint  Patient presents with   Shortness of Breath    Reason for Visit: F/u abnormality on echocardiogram.  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  Initial consult She is referred to Pulmonary for pulmonary artery abnormality seen on echocardiogram 03/08/24. She has lower extremity edema and shortness of breath with activity including with cleaning house. Bending over causes dizziness. Feels like difficulty getting air in. Denies wheezing. Reports leg swelling that has worsened in last three years since her cirrhosis diagnosis. Worsens at the end of the day. Wearing compression socks daily with improvement. On torsemide  and spironolactone  daily. Able to walk 100-200 ft without stopping but needs walker for shopping at grocery store. Her wife reports snoring at night for >10 years but no witnessed apnea  06/20/23 Since our last visit she had HST and PFTs completed. She was contact prior to visit regarding HST demonstrate moderate OSA and ordering CPAP. Continues to ave unchanged shortness of breath with moderate to heavy exertion including when walking at the store with her walker. Device is ready for her to pick up later today. She presents after PFTs.   Social History: Quit 20 years ago. Smoked 15 years x 1 ppd. Retired pediatric physical therapist  Environmental exposures: None  Past Medical History:  Diagnosis Date   Allergic rhinitis 03/13/2016   Arthritis    ankles   Asthma    Benign neoplasm of colon    Bilateral lower extremity edema 2023   CAP (community acquired pneumonia) 09/21/2021   Chronic pain    LLE reflex sympathetic dystrophy, follows with Pain  Management, Sherlean New, PA.   Cirrhosis of liver 09/21/2021   Closed trimalleolar fracture of left ankle 03/21/2021   Dysphagia    Esophageal varices    Follows w/ Stephane Quest, NP @ [facility].   Gallstones    Generalized anxiety disorder    Follows w/ PCP Dr. Adaline @ [facility].   GERD (gastroesophageal reflux disease)    taking Protonix    Headache    stopped after menopause   Hemolytic anemia    Follows w/ Dr. Amber Iruku @ [facility], see 06/15/22 OV in Epic, s/p iron infusions   Hepatic encephalopathy 10/11/2021   treated w/ lactulose  and rifaximim   History of COVID-19 03/08/2021   History of food anaphylaxis 02/12/2020   History of pneumonia 08/2021   Hospital admission on 09/22/21.   IDA (iron deficiency anemia) 09/29/2021   Major depressive disorder 12/23/2012   Metabolic dysfunction-associated steatohepatitis (MASH) 10/20/2022   Mild neurocognitive disorder 03/17/2024   Morbid obesity 10/11/2021   PMB (postmenopausal bleeding) 2023   Reflex sympathetic dystrophy of lower extremity 09/13/2013   SIRS (systemic inflammatory response syndrome) 09/21/2021   Tachycardia 09/21/2021   Thrombocytopenia    Follows w/ Dr. Amber Iruku @ [facility].   Urticaria    Wears glasses      Family History  Problem Relation Age of Onset   Intellectual disability Mother    Hypertension Mother    Hyperlipidemia Mother    Depression Mother    Arthritis Mother    Mental illness Mother    Parkinson's disease Mother    Heart disease Father  Diabetes Father    Stroke Father    Parkinson's disease Brother    Heart disease Brother    COPD Brother    Mental illness Daughter    Learning disabilities Daughter    Diabetes Daughter    Allergic rhinitis Neg Hx    Angioedema Neg Hx    Asthma Neg Hx    Eczema Neg Hx    Immunodeficiency Neg Hx    Urticaria Neg Hx    Colon cancer Neg Hx    Pancreatic cancer Neg Hx    Liver cancer Neg Hx    Stomach  cancer Neg Hx      Social History   Occupational History   Occupation: Retired    Comment: Pediatric PT provider  Tobacco Use   Smoking status: Former    Current packs/day: 0.00    Average packs/day: 1 pack/day for 15.0 years (15.0 ttl pk-yrs)    Types: Cigarettes    Start date: 01/1989    Quit date: 01/2004    Years since quitting: 20.4   Smokeless tobacco: Never  Vaping Use   Vaping status: Never Used  Substance and Sexual Activity   Alcohol use: Not Currently   Drug use: No   Sexual activity: Not on file    Allergies  Allergen Reactions   Shellfish Allergy Anaphylaxis, Hives, Itching, Shortness Of Breath and Swelling   Amitriptyline     rash   Iodine  Hives    Other Reaction(s): Other (See Comments)   Shellfish Protein-Containing Drug Products      Outpatient Medications Prior to Visit  Medication Sig Dispense Refill   baclofen  (LIORESAL ) 20 MG tablet TAKE 1 TABLET(20 MG) BY MOUTH TWICE DAILY 180 tablet 3   busPIRone  (BUSPAR ) 10 MG tablet TAKE 1 TABLET(10 MG) BY MOUTH TWICE DAILY 180 tablet 1   carvedilol  (COREG ) 3.125 MG tablet Take 1 tablet (3.125 mg total) by mouth in the morning and at bedtime. 180 tablet 1   EPINEPHrine  0.3 mg/0.3 mL IJ SOAJ injection Inject into the muscle.     lactulose , encephalopathy, (CHRONULAC ) 10 GM/15ML SOLN SMARTSIG:Milliliter(s) By Mouth     levonorgestrel (MIRENA, 52 MG,) 20 MCG/DAY IUD 1 each by Intrauterine route.     pantoprazole  (PROTONIX ) 40 MG tablet TAKE 1 TABLET(40 MG) BY MOUTH DAILY 90 tablet 3   rifaximin  (XIFAXAN ) 550 MG TABS tablet Take 550 mg by mouth in the morning and at bedtime.     sertraline  (ZOLOFT ) 50 MG tablet TAKE 1 TABLET(50 MG) BY MOUTH DAILY 90 tablet 1   spironolactone  (ALDACTONE ) 50 MG tablet Take 1 tablet (50 mg total) by mouth daily. 90 tablet 3   torsemide  (DEMADEX ) 20 MG tablet Take 1 tablet (20 mg total) by mouth daily. 90 tablet 1   CALCIUM CITRATE PO Take 1 tablet by mouth at bedtime. (Patient not  taking: Reported on 06/14/2024)     cyanocobalamin  (VITAMIN B12) 1000 MCG tablet Take 1,000 mcg by mouth daily. (Patient not taking: Reported on 06/14/2024)     potassium chloride  SA (KLOR-CON  M) 20 MEQ tablet Take 1 tablet (20 mEq total) by mouth daily. Only on days take the second lasix  (Patient not taking: Reported on 06/14/2024) 90 tablet 1   VITAMIN D PO Take 1 tablet by mouth at bedtime. (Patient not taking: Reported on 06/14/2024)     No facility-administered medications prior to visit.    Review of Systems  Constitutional:  Negative for chills, diaphoresis, fever, malaise/fatigue and weight loss.  HENT:  Negative for congestion.   Respiratory:  Positive for shortness of breath. Negative for cough, hemoptysis, sputum production and wheezing.   Cardiovascular:  Negative for chest pain, palpitations and leg swelling.     Objective:   Vitals:   06/14/24 1030  BP: (!) 105/52  Pulse: 77  SpO2: 98%  Weight: 268 lb (121.6 kg)  Height: 5' 5 (1.651 m)   SpO2: 98 %  Physical Exam: General: Well-appearing, no acute distress HENT: [place], AT Eyes: EOMI, no scleral icterus Respiratory: Clear to auscultation bilaterally.  No crackles, wheezing or rales Cardiovascular: RRR, -M/R/G, no JVD Extremities:-Edema,-tenderness Neuro: AAO x4, CNII-XII grossly intact Psych: Normal mood, normal affect  Data Reviewed:  Imaging: CTA 02/24/23 - No PE, normal parenchyma CXR 12/10/23 - Mild cardiomegaly, interstitial edema, hypoventilation  PFT: 06/14/24 FVC 2.87 (87%) FEV1 2.29 (90%) Ratio 80  TLC 98% DLCO 97% Interpretation: Normal PFTs   Cardiac: TTE 03/08/24 EF 65-70%, no WMA, RVSP size and function normal. Mildly elevated PASP. Left atrium mildly dilated. No significant valvular abnormalities seen  Labs:    Latest Ref Rng & Units 04/04/2024   10:41 AM 12/24/2023    2:10 PM 12/10/2023   10:26 PM  CBC  WBC 4.0 - 10.5 K/uL 4.6  7.5  6.2   Hemoglobin 12.0 - 15.0 g/dL 86.8  87.1  87.0    Hematocrit 36.0 - 46.0 % 39.5  38.6  39.6   Platelets 150.0 - 400.0 K/uL 78.0  97.0  79         Latest Ref Rng & Units 04/04/2024   10:41 AM 12/24/2023    2:10 PM 12/10/2023    8:38 PM  CMP  Glucose 70 - 99 mg/dL 90  66  847   BUN 6 - 23 mg/dL 21  21  16    Creatinine 0.40 - 1.20 mg/dL 9.00  8.95  8.84   Sodium 135 - 145 mEq/L 143  142  139   Potassium 3.5 - 5.1 mEq/L 4.1  3.8  4.0   Chloride 96 - 112 mEq/L 104  106  106   CO2 19 - 32 mEq/L 31  28  23    Calcium 8.4 - 10.5 mg/dL 8.9  9.0  8.6   Total Protein 6.0 - 8.3 g/dL 6.8  7.1  7.3   Total Bilirubin 0.2 - 1.2 mg/dL 2.3  2.3  3.1   Alkaline Phos 39 - 117 U/L 223  199  227   AST 0 - 37 U/L 49  46  73   ALT 0 - 35 U/L 23  23  32     Absolute eos 04/04/24 - 0  Sleep: HST 05/11/24 Interpretation: Mild and Moderate OSA with AHI of 13.4, O2 desaturation 8 min. O2 nadir 80%.     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  Elevated PASP on echocardiogram can screen for possible pulmonary hypertension but does not confirm. No evidence of heart failure on echo. Normal lung parenchyma on CTA 02/2023. PFTs reviewed and normal. Home sleep study confirmed moderate OSA. Possible PH would be secondary to OSA. Management recommended would be treating underlying sleep apnea.     No indication for cardiac cath Reviewed pulmonary pulmonary function tests. Normal CPAP to be picked up Friday.   Health Maintenance Immunization History  Administered  Date(s) Administered   Fluad Quad(high Dose 65+) 05/08/2022   Hepatitis A, Adult 10/15/2021   Hepatitis B, ADULT 12/04/2021   Hepb-cpg 10/15/2021   INFLUENZA, HIGH DOSE SEASONAL PF 04/04/2024   Influenza Split 02/28/2013   Influenza, Seasonal, Injecte, Preservative Fre  04/20/2023   Influenza,inj,Quad PF,6+ Mos 03/22/2018, 01/15/2020   Janssen (J&J) SARS-COV-2 Vaccination 08/29/2019   Moderna SARS-COV2 Booster Vaccination 04/28/2020   PNEUMOCOCCAL CONJUGATE-20 04/04/2024   Tdap 03/22/2018   CT Lung Screen - not qualified  No orders of the defined types were placed in this encounter. No orders of the defined types were placed in this encounter.   Return in about 3 months (around 09/12/2024) for PA for OSA follow-up. Anytime in April .  I have spent a total time of 30-minutes on the day of the appointment including chart review, data review, collecting history, coordinating care and discussing medical diagnosis and plan with the patient/family. Past medical history, allergies, medications were reviewed. Pertinent imaging, labs and tests included in this note have been reviewed and interpreted independently by me.  Jadarius Commons Slater Staff, MD [facility] 06/14/2024 10:53 AM     "

## 2024-06-20 ENCOUNTER — Telehealth (HOSPITAL_BASED_OUTPATIENT_CLINIC_OR_DEPARTMENT_OTHER): Payer: Self-pay

## 2024-06-20 NOTE — Telephone Encounter (Signed)
 CMN received for CPAP supplies signed  sent to [facility] by provider and faxed confirmation received

## 2024-07-07 ENCOUNTER — Ambulatory Visit: Payer: Medicare (Managed Care) | Admitting: Physician Assistant

## 2024-07-11 ENCOUNTER — Ambulatory Visit (HOSPITAL_BASED_OUTPATIENT_CLINIC_OR_DEPARTMENT_OTHER): Admitting: Cardiology

## 2024-09-12 ENCOUNTER — Ambulatory Visit (HOSPITAL_BASED_OUTPATIENT_CLINIC_OR_DEPARTMENT_OTHER)

## 2024-10-02 ENCOUNTER — Ambulatory Visit: Payer: Medicare (Managed Care) | Admitting: Family Medicine

## 2024-12-25 ENCOUNTER — Ambulatory Visit: Payer: Medicare (Managed Care)
